# Patient Record
Sex: Female | Born: 1970 | Race: White | Hispanic: Yes | Marital: Married | Smoking: Never smoker
Health system: Southern US, Community
[De-identification: ages and names within clinical notes are randomized; demographics above are authoritative.]

## PROBLEM LIST (undated history)

## (undated) DIAGNOSIS — R42 Dizziness and giddiness: Secondary | ICD-10-CM

## (undated) DIAGNOSIS — R519 Headache, unspecified: Secondary | ICD-10-CM

## (undated) DIAGNOSIS — I10 Essential (primary) hypertension: Secondary | ICD-10-CM

## (undated) DIAGNOSIS — I639 Cerebral infarction, unspecified: Secondary | ICD-10-CM

## (undated) DIAGNOSIS — R51 Headache: Secondary | ICD-10-CM

## (undated) DIAGNOSIS — F32A Depression, unspecified: Secondary | ICD-10-CM

## (undated) DIAGNOSIS — F329 Major depressive disorder, single episode, unspecified: Secondary | ICD-10-CM

## (undated) DIAGNOSIS — E785 Hyperlipidemia, unspecified: Secondary | ICD-10-CM

## (undated) DIAGNOSIS — U071 COVID-19: Secondary | ICD-10-CM

## (undated) DIAGNOSIS — I493 Ventricular premature depolarization: Secondary | ICD-10-CM

## (undated) HISTORY — DX: Ventricular premature depolarization: I49.3

## (undated) HISTORY — DX: Essential (primary) hypertension: I10

## (undated) HISTORY — DX: Headache, unspecified: R51.9

## (undated) HISTORY — DX: Major depressive disorder, single episode, unspecified: F32.9

## (undated) HISTORY — DX: Depression, unspecified: F32.A

## (undated) HISTORY — DX: Headache: R51

## (undated) HISTORY — PX: OTHER SURGICAL HISTORY: SHX169

---

## 2015-01-01 ENCOUNTER — Encounter (HOSPITAL_COMMUNITY): Payer: Self-pay | Admitting: *Deleted

## 2015-01-01 ENCOUNTER — Emergency Department (HOSPITAL_COMMUNITY): Payer: Self-pay

## 2015-01-01 ENCOUNTER — Emergency Department (HOSPITAL_COMMUNITY)
Admission: EM | Admit: 2015-01-01 | Discharge: 2015-01-01 | Disposition: A | Discharge status: Home / Self Care | Payer: Self-pay | Attending: Emergency Medicine | Admitting: Emergency Medicine

## 2015-01-01 DIAGNOSIS — R42 Dizziness and giddiness: Secondary | ICD-10-CM | POA: Insufficient documentation

## 2015-01-01 DIAGNOSIS — R51 Headache: Secondary | ICD-10-CM | POA: Insufficient documentation

## 2015-01-01 DIAGNOSIS — Z87440 Personal history of urinary (tract) infections: Secondary | ICD-10-CM | POA: Insufficient documentation

## 2015-01-01 DIAGNOSIS — R519 Headache, unspecified: Secondary | ICD-10-CM

## 2015-01-01 DIAGNOSIS — Z792 Long term (current) use of antibiotics: Secondary | ICD-10-CM | POA: Insufficient documentation

## 2015-01-01 DIAGNOSIS — R202 Paresthesia of skin: Secondary | ICD-10-CM | POA: Insufficient documentation

## 2015-01-01 LAB — URINE MICROSCOPIC-ADD ON

## 2015-01-01 LAB — URINALYSIS, ROUTINE W REFLEX MICROSCOPIC
Bilirubin Urine: NEGATIVE
Glucose, UA: NEGATIVE mg/dL
Ketones, ur: NEGATIVE mg/dL
Nitrite: NEGATIVE
Protein, ur: NEGATIVE mg/dL
Specific Gravity, Urine: 1.014 (ref 1.005–1.030)
Urobilinogen, UA: 0.2 mg/dL (ref 0.0–1.0)
pH: 7.5 (ref 5.0–8.0)

## 2015-01-01 MED ORDER — METOCLOPRAMIDE HCL 5 MG/ML IJ SOLN
10.0000 mg | Freq: Once | INTRAMUSCULAR | Status: AC
  Administered 2015-01-01: 10 mg via INTRAVENOUS
  Filled 2015-01-01: qty 2

## 2015-01-01 MED ORDER — KETOROLAC TROMETHAMINE 15 MG/ML IJ SOLN
15.0000 mg | Freq: Once | INTRAMUSCULAR | Status: AC
  Administered 2015-01-01: 15 mg via INTRAVENOUS
  Filled 2015-01-01: qty 1

## 2015-01-01 MED ORDER — DIPHENHYDRAMINE HCL 50 MG/ML IJ SOLN
25.0000 mg | Freq: Once | INTRAMUSCULAR | Status: AC
  Administered 2015-01-01: 25 mg via INTRAVENOUS
  Filled 2015-01-01: qty 1

## 2015-01-01 NOTE — ED Notes (Signed)
Rt headache with intermittent  Arm and leg numbness with dizziness since Tuesday.  She has seen a doctor since then  And diagnosed with vertigo.  lmpo jan 20th

## 2015-01-01 NOTE — ED Provider Notes (Signed)
CSN: [id number]     Arrival date & time 01/01/15  1851 History   First MD Initiated Contact with Patient 01/01/15 1909     Chief Complaint  Patient presents with  . Headache     (Consider location/radiation/quality/duration/timing/severity/associated sxs/prior Treatment) Patient is a 44 y.o. female presenting with headaches. The history is provided by the patient. No language interpreter was used.  Headache Pain location:  R temporal Quality:  Dull Radiates to:  Does not radiate Severity currently:  10/10 Severity at highest:  10/10 Onset quality:  Gradual Duration:  5 days Timing:  Intermittent Progression:  Waxing and waning Chronicity:  New Similar to prior headaches: no   Context: not activity, not defecating and not stress   Relieved by:  Nothing Worsened by:  Nothing tried Ineffective treatments:  Aspirin and NSAIDs Associated symptoms: paresthesias (R hand and foot)   Associated symptoms: no abdominal pain, no back pain, no cough, no ear pain, no pain, no fever, no nausea, no photophobia, no vomiting and no weakness     History reviewed. No pertinent past medical history. History reviewed. No pertinent past surgical history. No family history on file. History  Substance Use Topics  . Smoking status: Never Smoker   . Smokeless tobacco: Not on file  . Alcohol Use: No   OB History    No data available     Review of Systems  Constitutional: Negative for fever.  HENT: Negative for ear pain.   Eyes: Negative for photophobia and pain.  Respiratory: Negative for cough.   Gastrointestinal: Negative for nausea, vomiting and abdominal pain.  Musculoskeletal: Negative for back pain.  Neurological: Positive for headaches and paresthesias (R hand and foot).  All other systems reviewed and are negative.     Allergies  Review of patient's allergies indicates no known allergies.  Home Medications   Prior to Admission medications   Medication Sig Start Date End Date  Taking? Authorizing Provider  sulfamethoxazole-trimethoprim (BACTRIM DS,SEPTRA DS) 800-160 MG per tablet Take 1 tablet by mouth 2 (two) times daily. Started 12/28/14, for 7 days ending 01/03/15   Yes Historical Provider, MD   BP 115/70 mmHg  Pulse 96  Temp(Src) 98.2 F (36.8 C) (Oral)  Resp 22  SpO2 98%  LMP 12/18/2014 Physical Exam  Constitutional: She is oriented to person, place, and time. She appears well-developed and well-nourished. No distress.  HENT:  Head: Normocephalic and atraumatic.  Eyes: Pupils are equal, round, and reactive to light.  Neck: Normal range of motion.  Cardiovascular: Normal rate, regular rhythm, normal heart sounds and intact distal pulses.   Pulmonary/Chest: Effort normal. No respiratory distress. She has no wheezes. She exhibits no tenderness.  Abdominal: Soft. Bowel sounds are normal. She exhibits no distension. There is no tenderness. There is no rebound and no guarding.  Neurological: She is alert and oriented to person, place, and time. She has normal strength. No cranial nerve deficit or sensory deficit. She exhibits normal muscle tone. Coordination and gait normal.  Strength 5/5 bilateral upper and lower extremities.  Sensation intact x4 extremities.  CN II-XII intact.  Normal gait with no ataxia.    Skin: Skin is warm and dry.  Nursing note and vitals reviewed.   ED Course  Procedures (including critical care time) Labs Review Labs Reviewed  URINALYSIS, ROUTINE W REFLEX MICROSCOPIC - Abnormal; Notable for the following:    Hgb urine dipstick TRACE (*)    Leukocytes, UA MODERATE (*)    All other components  within normal limits  URINE MICROSCOPIC-ADD ON    Imaging Review Ct Head Wo Contrast  01/01/2015   CLINICAL DATA:  Right-sided headache with intermittent arm and leg numbness. Dizziness. Since Tuesday. Patient has seen a Dr. Since then and was diagnosed with vertigo.  EXAM: CT HEAD WITHOUT CONTRAST  TECHNIQUE: Contiguous axial images were  obtained from the base of the skull through the vertex without intravenous contrast.  COMPARISON:  None.  FINDINGS: Ventricles and sulci appear symmetrical. No mass effect or midline shift. No abnormal extra-axial fluid collections. Gray-white matter junctions are distinct. Basal cisterns are not effaced. No evidence of acute intracranial hemorrhage. No depressed skull fractures. Visualized paranasal sinuses and mastoid air cells are not opacified.  IMPRESSION: No acute intracranial abnormalities.  Normal examination.   Electronically Signed   By: Lucienne Capers M.D.   On: 01/01/2015 21:43     EKG Interpretation None      MDM   Final diagnoses:  Headache   Patient is a 44 year old Hispanic female with no pertinent past medical history who comes to the emergency department today with right-sided headache intermittently since Tuesday. Patient has also had some dizziness associated with her headache.  She states intermittently she has also had some tingling in her hand and leg. Patient denies any symptoms now with the exception of the headache. Patient initially went to urgent care a couple of days ago and was diagnosed with a urinary tract infection and provided with Bactrim. She states that she was having nausea, vomiting, and dysuria prior to this treatment which has since resolved. UA was obtained to further evaluate for resolution of the UTI. UA demonstrated moderate leukocytes and rare bacteria, however microscopic only 0-2 WBCs as result I feel that the UTI is likely resolving at this time particularly with resolution of patient's symptoms. Patient is very concerned that she may have a tumor or stroke as a result a CT of her head was obtained to evaluate this further. CT head demonstrated no acute findings.  I doubt a CVA, hemorrhage or mass. Patient has no focal weakness.  I doubt a CVA. Patient was able to ambulate without any difficulties doubt a posterior CVA.  With slow intermittent headache  doubt SAH.  With no fevers doubt meningitis.  With young age doubt temporal arteritis.  Patient was treated with Reglan and Benadryl with moderate improvement in her symptoms. As a result I feel she is stable for discharge. Patient was instructed to return to the emergency department with weakness, numbness, or any other concerns. The patient expressed understanding. Labs and imaging reviewed by myself and considered in medical decision making. Imaging interpreted by radiology. Care was discussed with my attending Dr. Reather Converse.       Katheren Shams, MD 01/02/15 [id number]  Mariea Clonts, MD 01/03/15 [phone]

## 2015-01-01 NOTE — ED Notes (Signed)
Pt a/o x 4 on d/c with steady gait, with family.

## 2015-01-01 NOTE — Discharge Instructions (Signed)
If you were given medicines take as directed.  If you are on coumadin or contraceptives realize their levels and effectiveness is altered by many different medicines.  If you have any reaction (rash, tongues swelling, other) to the medicines stop taking and see a physician.   Please follow up as directed and return to the ER or see a physician for new or worsening symptoms.  Thank you. Filed Vitals:   01/01/15 1945 01/01/15 2015 01/01/15 2030 01/01/15 2045  BP: 140/75 127/85 132/87 147/91  Pulse: 94 93 91 101  Temp:      TempSrc:      Resp: 21 16 24 21   SpO2: 100% 100% 100% 100%

## 2015-01-01 NOTE — ED Notes (Signed)
Dr. Mariam Dollar at the bedside.

## 2015-01-26 ENCOUNTER — Ambulatory Visit (INDEPENDENT_AMBULATORY_CARE_PROVIDER_SITE_OTHER): Payer: Medicaid Other | Admitting: Neurology

## 2015-01-26 ENCOUNTER — Encounter: Payer: Self-pay | Admitting: Neurology

## 2015-01-26 VITALS — BP 154/86 | HR 106 | Temp 98.1°F | Resp 20 | Ht 64.0 in | Wt 146.2 lb

## 2015-01-26 DIAGNOSIS — H8110 Benign paroxysmal vertigo, unspecified ear: Secondary | ICD-10-CM

## 2015-01-26 DIAGNOSIS — G43109 Migraine with aura, not intractable, without status migrainosus: Secondary | ICD-10-CM

## 2015-01-26 NOTE — Patient Instructions (Addendum)
I think the headache and the numbness are all related to migraine. 1.  When you get a headache, take the Aleve at the earliest onset of the headache 2.  To help you sleep, try practicing the exercises we talked about 3.  Get your eyes checked.  You can see an optometrist at places like [facility] 4.  Follow up in 3 months but call with questions or concerns.  Creo que el dolor de cabeza y la sensacin de adormecimiento estn todos relacionados con la migraa. 1. Cuando usted consigue un dolor de cabeza, toma la Aleve en la primera aparicin del dolor de cabeza 2. Para ayudar a dormir, trate de Psychologist, prison and probation services los ejercicios de las que hablamos 3. Obtener chequeos de los ojos. Se puede ver un optometrista en lugares como [facility] 4. Seguimiento de 3 meses, pero llamar con preguntas o preocupaciones.

## 2015-01-26 NOTE — Progress Notes (Signed)
NEUROLOGY CONSULTATION NOTE  Carla [name] MRN: [id number] DOB: June 18, 1971  Referring provider: Dr. Jodi Mourning (ED) Primary care provider: NA  Reason for consult:  headache  HISTORY OF PRESENT ILLNESS: Carla [name] is a 44 year old left-handed woman who presents for headache.  Records, labs and CT of head reviewed.  Onset:  She has had headaches since around age 77. Location:  In the temples or eyes, either side Quality:  Sharp throbbing Intensity:  8/10 Aura:  no Prodrome:  no Associated symptoms:  Photophobia and phonophobia.  Sometimes nausea Duration:  2 hours (sometimes less with Aleve) Frequency:  Once or twice a month Triggers/exacerbating factors:  none Relieving factors:  Aleve, sliced potatoes on her temples while laying down Activity:  Able to function if needed.  In late January, she had an episode where she passed out.  She complained of not feeling well, including dysuria.  She went to urgent care and was diagnosed with a UTI.  She was started on antibiotics.  She also was experiencing intermittent episodes of spinning and lightheadedness that would occur with movement or closing her eyes.  It would last a few minutes.  She then developed her typical headache.  About three days later, on 01/01/15, she developed right facial numbness.  CT of the head was unremarkable.  UA still showed moderate leukocytes.  Headache was treated.  Headache and symptoms lasted all day and resolved.  She has not had another headache.  Past abortive therapy:  none Past preventative therapy:  none  Current abortive therapy:  Aleve Current preventative therapy:  none  Sleep hygiene:  Sleep is sometimes poor.   PAST MEDICAL HISTORY: Past Medical History  Diagnosis Date  . Headache     PAST SURGICAL HISTORY: No past surgical history on file.  MEDICATIONS: Current Outpatient Prescriptions on File Prior to Visit  Medication Sig Dispense Refill  . sulfamethoxazole-trimethoprim (BACTRIM  DS,SEPTRA DS) 800-160 MG per tablet Take 1 tablet by mouth 2 (two) times daily. Started 12/28/14, for 7 days ending 01/03/15     No current facility-administered medications on file prior to visit.    ALLERGIES: No Known Allergies  FAMILY HISTORY: Family History  Problem Relation Age of Onset  . Asthma Neg Hx   . Cancer Neg Hx   . Diabetes Neg Hx   . Hyperlipidemia Neg Hx   . Heart failure Neg Hx   . Hypertension Neg Hx   . Migraines Neg Hx   . Migraines Neg Hx   . Rashes / Skin problems Neg Hx   . Seizures Neg Hx   . Stroke Neg Hx   . Thyroid disease Neg Hx     SOCIAL HISTORY: History   Social History  . Marital Status: Single    Spouse Name: N/A  . Number of Children: N/A  . Years of Education: N/A   Occupational History  . Not on file.   Social History Main Topics  . Smoking status: Never Smoker   . Smokeless tobacco: Not on file  . Alcohol Use: No  . Drug Use: Not on file  . Sexual Activity: Not on file   Other Topics Concern  . Not on file   Social History Narrative    REVIEW OF SYSTEMS: Constitutional: No fevers, chills, or sweats, no generalized fatigue, change in appetite Eyes: No visual changes, double vision, eye pain Ear, nose and throat: No hearing loss, ear pain, nasal congestion, sore throat Cardiovascular: No chest pain, palpitations Respiratory:  No shortness of breath at rest or with exertion, wheezes GastrointestinaI: No nausea, vomiting, diarrhea, abdominal pain, fecal incontinence Genitourinary:  No dysuria, urinary retention or frequency Musculoskeletal:  No neck pain, back pain Integumentary: No rash, pruritus, skin lesions Neurological: as above Psychiatric: No depression, insomnia, anxiety Endocrine: No palpitations, fatigue, diaphoresis, mood swings, change in appetite, change in weight, increased thirst Hematologic/Lymphatic:  No anemia, purpura, petechiae. Allergic/Immunologic: no itchy/runny eyes, nasal congestion, recent  allergic reactions, rashes  PHYSICAL EXAM: Filed Vitals:   01/26/15 1428  BP: 154/86  Pulse: 106  Temp: 98.1 F (36.7 C)  Resp: 20   General: No acute distress Head:  Normocephalic/atraumatic Eyes:  fundi unremarkable, without vessel changes, exudates, hemorrhages or papilledema. Neck: supple, no paraspinal tenderness, full range of motion Back: No paraspinal tenderness Heart: regular rate and rhythm Lungs: Clear to auscultation bilaterally. Vascular: No carotid bruits. Neurological Exam: Mental status: alert and oriented to person, place, and time, recent and remote memory intact, fund of knowledge intact, attention and concentration intact, speech fluent and not dysarthric, language intact. Cranial nerves: CN I: not tested CN II: pupils equal, round and reactive to light, visual fields intact, fundi unremarkable, without vessel changes, exudates, hemorrhages or papilledema. CN III, IV, VI:  full range of motion, no nystagmus, no ptosis CN V: facial sensation intact CN VII: upper and lower face symmetric CN VIII: hearing intact CN IX, X: gag intact, uvula midline CN XI: sternocleidomastoid and trapezius muscles intact CN XII: tongue midline Bulk & Tone: normal, no fasciculations. Motor:  5/5 throughout Sensation:  Temperature and vibration intact Deep Tendon Reflexes:  2+ throughout, toes downgoing Finger to nose testing:  No dysmetria Heel to shin:  No dysmetria Gait:  Normal station and stride. Able to turn and walk in tandem. Romberg negative.  IMPRESSION: Migraine with and without aura Vertigo, likely inner ear pathology  PLAN: Since it is well-controlled (occuring once or twice a month and lasting for no more than 2 hours), I wouldn't start a preventative medication.  She may continue Aleve as needed for now.  She should work on the sleep hygiene tips.  Follow up in 3 months.  Thank you for allowing me to take part in the care of this patient.  Shon Millet,  DO

## 2015-05-04 ENCOUNTER — Ambulatory Visit: Payer: Medicaid Other | Admitting: Neurology

## 2015-05-05 ENCOUNTER — Encounter: Payer: Self-pay | Admitting: Neurology

## 2015-08-04 ENCOUNTER — Emergency Department (INDEPENDENT_AMBULATORY_CARE_PROVIDER_SITE_OTHER)
Admission: EM | Admit: 2015-08-04 | Discharge: 2015-08-04 | Disposition: A | Discharge status: Home / Self Care | Payer: Self-pay | Source: Home / Self Care | Attending: Emergency Medicine | Admitting: Emergency Medicine

## 2015-08-04 ENCOUNTER — Encounter (HOSPITAL_COMMUNITY): Payer: Self-pay | Admitting: Emergency Medicine

## 2015-08-04 DIAGNOSIS — H8302 Labyrinthitis, left ear: Secondary | ICD-10-CM

## 2015-08-04 MED ORDER — PREDNISONE 20 MG PO TABS: ORAL_TABLET | ORAL | Status: DC

## 2015-08-04 NOTE — ED Provider Notes (Signed)
CSN: [id number]     Arrival date & time 08/04/15  1553 History   First MD Initiated Contact with Patient 08/04/15 1627     Chief Complaint  Patient presents with  . Dizziness   (Consider location/radiation/quality/duration/timing/severity/associated sxs/prior Treatment) HPI  She is a 44 year old woman here for follow-up of vertigo. She states that 6 months ago she was diagnosed with vertigo. She was prescribed meloxicam and meclizine at that time which she states does help some. In the last week, her symptoms have gotten worse. She reports feeling a crawly sensation in her left ear as well as tinnitus in the left ear. She denies any loss of hearing. She does report some stinging sensation that makes it hard for her to balance. She states some days are fine and others are worse. Denies any specific triggers. History is limited by language barrier. She does have a friend that acts as Astronomer.  Past Medical History  Diagnosis Date  . Headache    History reviewed. No pertinent past surgical history. Family History  Problem Relation Age of Onset  . Asthma Neg Hx   . Cancer Neg Hx   . Diabetes Neg Hx   . Hyperlipidemia Neg Hx   . Heart failure Neg Hx   . Hypertension Neg Hx   . Migraines Neg Hx   . Migraines Neg Hx   . Rashes / Skin problems Neg Hx   . Seizures Neg Hx   . Stroke Neg Hx   . Thyroid disease Neg Hx    Social History  Substance Use Topics  . Smoking status: Never Smoker   . Smokeless tobacco: None  . Alcohol Use: No   OB History    No data available     Review of Systems As in history of present illness Allergies  Review of patient's allergies indicates no known allergies.  Home Medications   Prior to Admission medications   Medication Sig Start Date End Date Taking? Authorizing Provider  meclizine (ANTIVERT) 25 MG tablet Take 25 mg by mouth 3 (three) times daily as needed for dizziness.   Yes Historical Provider, MD  meloxicam (MOBIC) 15 MG tablet Take 15  mg by mouth daily.   Yes Historical Provider, MD  predniSONE (DELTASONE) 20 MG tablet Take 3 tablets daily for 5 days, then 2 tablets for 3 days, then 1 tablet for 3 days, then 1/2 tablet for 3 days. 08/04/15   Melony Overly, MD   Meds Ordered and Administered this Visit  Medications - No data to display  BP 143/81 mmHg  Pulse 87  Temp(Src) 98.3 F (36.8 C) (Oral)  Resp 16  SpO2 100%  LMP 07/12/2015 No data found.   Physical Exam  Constitutional: She is oriented to person, place, and time. She appears well-developed and well-nourished. No distress.  HENT:  Mouth/Throat: Oropharynx is clear and moist. No oropharyngeal exudate.  TMs normal bilaterally.  Eyes: Conjunctivae are normal.  Neck: Neck supple.  Cardiovascular: Normal rate, regular rhythm and normal heart sounds.   No murmur heard. Pulmonary/Chest: Effort normal and breath sounds normal. No respiratory distress. She has no wheezes. She has no rales.  Lymphadenopathy:    She has no cervical adenopathy.  Neurological: She is alert and oriented to person, place, and time.  Negative Dix-Hallpike.    ED Course  Procedures (including critical care time)  Labs Review Labs Reviewed - No data to display  Imaging Review No results found.     MDM  1. Labyrinthitis of left ear    With the tinnitus and ear discomfort, suspect more of labyrinthitis. Recommended continuing meclizine and meloxicam as needed. We'll treat with a prednisone taper. If this is not improving or gets worse, she will go to the emergency room for additional evaluation.    Melony Overly, MD 08/04/15 1726

## 2015-08-04 NOTE — Discharge Instructions (Signed)

## 2015-08-04 NOTE — ED Notes (Signed)
Patient reports being seen on [address] -urgent care and prescribed meloxicam and meclizine.  Patient reports has had symptoms for intermittently for 6 months.  Patient reports pain to left side of face. No noticeable weakness in face

## 2015-09-27 ENCOUNTER — Emergency Department (HOSPITAL_COMMUNITY)
Admission: EM | Admit: 2015-09-27 | Discharge: 2015-09-27 | Disposition: A | Discharge status: Home / Self Care | Payer: Medicaid Other | Attending: Emergency Medicine | Admitting: Emergency Medicine

## 2015-09-27 ENCOUNTER — Emergency Department (HOSPITAL_COMMUNITY): Payer: Medicaid Other

## 2015-09-27 ENCOUNTER — Encounter (HOSPITAL_COMMUNITY): Payer: Self-pay

## 2015-09-27 DIAGNOSIS — R160 Hepatomegaly, not elsewhere classified: Secondary | ICD-10-CM | POA: Diagnosis not present

## 2015-09-27 DIAGNOSIS — R079 Chest pain, unspecified: Secondary | ICD-10-CM

## 2015-09-27 DIAGNOSIS — R202 Paresthesia of skin: Secondary | ICD-10-CM | POA: Insufficient documentation

## 2015-09-27 DIAGNOSIS — Z7982 Long term (current) use of aspirin: Secondary | ICD-10-CM | POA: Diagnosis not present

## 2015-09-27 DIAGNOSIS — Z8781 Personal history of (healed) traumatic fracture: Secondary | ICD-10-CM | POA: Diagnosis not present

## 2015-09-27 LAB — COMPREHENSIVE METABOLIC PANEL
ALBUMIN: 3.9 g/dL (ref 3.5–5.0)
ALT: 19 U/L (ref 14–54)
ANION GAP: 7 (ref 5–15)
AST: 21 U/L (ref 15–41)
Alkaline Phosphatase: 70 U/L (ref 38–126)
BILIRUBIN TOTAL: 1 mg/dL (ref 0.3–1.2)
BUN: 7 mg/dL (ref 6–20)
CALCIUM: 9.3 mg/dL (ref 8.9–10.3)
CO2: 25 mmol/L (ref 22–32)
Chloride: 107 mmol/L (ref 101–111)
Creatinine, Ser: 0.77 mg/dL (ref 0.44–1.00)
Glucose, Bld: 80 mg/dL (ref 65–99)
POTASSIUM: 3.6 mmol/L (ref 3.5–5.1)
Sodium: 139 mmol/L (ref 135–145)
TOTAL PROTEIN: 7 g/dL (ref 6.5–8.1)

## 2015-09-27 LAB — CBC
HCT: 42.5 % (ref 36.0–46.0)
Hemoglobin: 13.7 g/dL (ref 12.0–15.0)
MCH: 28.7 pg (ref 26.0–34.0)
MCHC: 32.2 g/dL (ref 30.0–36.0)
MCV: 89.1 fL (ref 78.0–100.0)
Platelets: 326 10*3/uL (ref 150–400)
RBC: 4.77 MIL/uL (ref 3.87–5.11)
RDW: 12.8 % (ref 11.5–15.5)
WBC: 8.5 10*3/uL (ref 4.0–10.5)

## 2015-09-27 LAB — I-STAT TROPONIN, ED: TROPONIN I, POC: 0.01 ng/mL (ref 0.00–0.08)

## 2015-09-27 LAB — I-STAT BETA HCG BLOOD, ED (MC, WL, AP ONLY): I-stat hCG, quantitative: <5 m[IU]/mL (ref <5)

## 2015-09-27 LAB — APTT: aPTT: 30 seconds (ref 24–37)

## 2015-09-27 LAB — PROTIME-INR
INR: 1.08 (ref 0.00–1.49)
PROTHROMBIN TIME: 14.2 s (ref 11.6–15.2)

## 2015-09-27 LAB — D-DIMER, QUANTITATIVE: D-Dimer, Quant: 0.69 ug/mL-FEU — ABNORMAL HIGH (ref 0.00–0.48)

## 2015-09-27 MED ORDER — IOHEXOL 350 MG/ML SOLN
80.0000 mL | Freq: Once | INTRAVENOUS | Status: AC | PRN
  Administered 2015-09-27: 70 mL via INTRAVENOUS

## 2015-09-27 MED ORDER — TRAMADOL HCL 50 MG PO TABS: 50.0000 mg | ORAL_TABLET | Freq: Four times a day (QID) | ORAL | Status: DC | PRN

## 2015-09-27 MED ORDER — ASPIRIN 81 MG PO CHEW
324.0000 mg | CHEWABLE_TABLET | Freq: Once | ORAL | Status: AC
  Administered 2015-09-27: 324 mg via ORAL
  Filled 2015-09-27: qty 4
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
## 2015-09-27 NOTE — Discharge Instructions (Signed)
La tomografa computarizada mostr lesiones en el hgado que son hemangiomas ms probables. Seguimiento con un mdico de cabecera como hemos discutido para Copy.    Dolor de pecho inespecfico  (Nonspecific Chest Pain) El dolor de pecho puede deberse a muchas enfermedades diferentes. Siempre existe una posibilidad de que el dolor est relacionado con algo grave, como un infarto de miocardio o un cogulo sanguneo en los pulmones. Hay muchas enfermedades que no son potencialmente mortales que pueden causar dolor de [place]. Si tiene Social research officer, government de Engineer, building services, es muy importante que se controle con el mdico. CAUSAS  Las causas del dolor de pecho pueden ser las siguientes:  Acidez estomacal.  Neumona o bronquitis.  Ansiedad o estrs.  Inflamacin de la zona que rodea al corazn (pericarditis) o a los pulmones (pleuritis o pleuresa).  Un cogulo sanguneo en el pulmn.  Colapso de un pulmn (neumotrax), que puede aparecer de [employer] repentina por s solo (neumotrax espontneo) o debido a un traumatismo en el trax.  Culebrilla (virus de la varicela zster).  Infarto de miocardio.  Dao de los [place], los msculos y los cartlagos que conforman la pared torcica. Esto puede incluir lo siguiente:  Hematomas seos debido a lesiones.  Distensiones musculares o de los cartlagos por tos frecuente o repetida, o por exceso de trabajo.  Fractura de una o ms costillas.  Dolor de Database administrator debido a inflamacin (costocondritis). FACTORES DE RIESGO  Los factores de riesgo de tener dolor de pecho pueden incluir lo siguiente:  Actividades que incrementan el riesgo de sufrir traumatismos o lesiones en el trax.  Infecciones o enfermedades respiratorias que causan tos frecuente.  Enfermedades o [employer] comidas que pueden causar Geographical information systems officer.  Enfermedades cardacas o antecedentes familiares de enfermedades cardacas.  Enfermedades o comportamientos de salud que  aumentan el riesgo de tener un cogulo sanguneo.  Haber tenido varicela (varicela zster). SIGNOS Y SNTOMAS El dolor de pecho puede provocar las siguientes sensaciones:  Ardor u hormigueo en la superficie o en lo profundo del pecho.  Dolor opresivo, continuo o constrictivo.  Dolor vago o intenso que empeora al [employer], toser o inhalar profundamente.  Dolor que tambin se siente en la espalda, el cuello, el hombro o el brazo, o dolor que se irradia a cualquiera de estas zonas. El dolor de pecho puede aparecer y Armed forces operational officer, o bien puede ser constante. DIAGNSTICO Ileene Hutchinson se necesiten anlisis de laboratorio u otros estudios para Animator causa del Social research officer, government. El mdico puede indicarle que se haga una prueba llamada EGC (electrocadiograma) ambulatorio. El Radio broadcast assistant los patrones de los latidos cardacos en el momento en que se realiza el [place]. Tambin pueden hacerle otros estudios, por ejemplo:  Ecocardiograma transtorcico (ETT). Durante el ecocardiograma, se usan ondas sonoras para crear una imagen de todas las estructuras cardacas y evaluar cmo circula la sangre por el corazn.  Ecocardiograma transesofgico (ETE).Este es un estudio de diagnstico por imgenes ms avanzado que el obtiene imgenes del interior del cuerpo. Le permite al mdico ver el corazn con mayor detalle.  Monitoreo cardaco. Permite que el mdico controle la frecuencia y el ritmo cardaco en tiempo real.  Monitor Holter. Es un dispositivo porttil que [employer] latidos del corazn y puede ayudar a Retail buyer las arritmias cardacas. Le permite al [employer] registrar la actividad [place], si es necesario.  Pruebas de esfuerzo. Estas pueden realizarse durante el ejercicio o mediante la administracin de un medicamento que acelera los latidos del corazn.  Anlisis de [address]  Diagnstico por imgenes. TRATAMIENTO  El tratamiento depende de la causa del dolor de [place]. El  tratamiento puede incluir lo siguiente:  Medicamentos. Estos pueden incluir lo siguiente:  Inhibidores de Psychologist, forensic.  Antiinflamatorios.  Analgsicos para las enfermedades inflamatorias.  Antibiticos, si hay una infeccin.  Medicamentos para [employer]  Medicamentos para tratar la enfermedad arterial coronaria.  Tratamiento complementario para las enfermedades que no requieren la toma de medicamentos. Esto puede incluir lo siguiente:  Descansar.  Aplicar compresas fras o calientes en las zonas lesionadas.  Limitar las actividades hasta que [employer]. INSTRUCCIONES PARA EL CUIDADO EN EL HOGAR  Si le recetaron antibiticos, asegrese de terminarlos, incluso si comienza a sentirse mejor.  Evite las [employer] causen dolor de [place].  No consuma ningn producto que contenga tabaco, lo que incluye cigarrillos, tabaco de Higher education careers adviser o Psychologist, sport and exercise. Si necesita ayuda para dejar de fumar, consulte al mdico.  No beba alcohol.  Tome los medicamentos solamente como se lo haya indicado el mdico.  Concurra a todas las visitas de control como se lo haya indicado el mdico. Esto es importante. Esto incluye otros estudios si el dolor de pecho no desaparece.  Si la acidez es la causa del dolor de [place], tal vez le aconsejen que mantenga la cabeza levantada (elevada) mientras duerme. Esto reduce la probabilidad de que el cido retroceda del estmago al esfago.  Haga cambios en su estilo de vida como se lo haya indicado el mdico. Estos pueden incluir lo siguiente:  Psychologist, prison and probation services actividad fsica con regularidad. Pida al mdico que le sugiera algunas actividades que sean seguras para usted.  Consumir una dieta cardiosaludable. Un nutricionista matriculado puede ayudarlo a Adult nurse saludables.  Mantener un peso saludable.  Controlar la diabetes, si es necesario.  Reducir las situaciones de estrs. SOLICITE ATENCIN MDICA  SI:  El dolor de pecho no desaparece despus del tratamiento.  Tiene una erupcin cutnea con ampollas en el pecho.  Tiene fiebre. SOLICITE ATENCIN MDICA DE INMEDIATO SI:   El dolor en el pecho es ms intenso.  La tos empeora, o expectora sangre.  Siente un dolor abdominal intenso.  Siente debilidad intensa.  Se desmaya.  Tiene escalofros.  Tiene una molestia repentina e inexplicable en el pecho.  Tiene molestias repentinas e [employer], la espalda, el cuello o la [place].  Le falta el aire en cualquier momento.  Comienza a sudar de [place] repentina o la piel se le humedece.  Siente nuseas o vomita.  Se siente repentinamente mareado o se desmaya.  Siente que el corazn comienza a latir rpidamente o que se saltea latidos. Estos sntomas pueden representar un problema grave que constituye Engineer, maintenance (IT). No espere hasta que los sntomas desaparezcan. Solicite atencin mdica de inmediato. Comunquese con el servicio de emergencias de su localidad (911 en los [place]). No conduzca por sus propios medios [employer].   Esta informacin no tiene Marine scientist el consejo del mdico. Asegrese de hacerle al mdico cualquier pregunta que tenga.   Document Released: 11/11/2005 Document Revised: 12/02/2014 Elsevier Interactive Patient Education [employer].

## 2015-09-27 NOTE — ED Notes (Signed)
Patient returned from CT

## 2015-09-27 NOTE — ED Notes (Signed)
Pt. Reports having generalized chest pain since Monday, bilateral rt. Arm  and into her rt. Back. Pt. Denies any sob, n/v.. She reports that the pain is every month. She denies any injuries.  She also had a headache last night.  Alert and oriented X4.  NSR on the monitor.

## 2015-10-10 ENCOUNTER — Encounter (HOSPITAL_COMMUNITY): Payer: Self-pay | Admitting: Emergency Medicine

## 2015-10-10 ENCOUNTER — Emergency Department (HOSPITAL_COMMUNITY)
Admission: EM | Admit: 2015-10-10 | Discharge: 2015-10-10 | Disposition: A | Discharge status: Home / Self Care | Payer: Medicaid Other | Source: Home / Self Care | Attending: Emergency Medicine | Admitting: Emergency Medicine

## 2015-10-10 DIAGNOSIS — R599 Enlarged lymph nodes, unspecified: Secondary | ICD-10-CM | POA: Diagnosis not present

## 2015-10-10 DIAGNOSIS — R200 Anesthesia of skin: Secondary | ICD-10-CM

## 2015-10-10 DIAGNOSIS — R590 Localized enlarged lymph nodes: Secondary | ICD-10-CM

## 2015-10-10 NOTE — Discharge Instructions (Signed)
With the numbness you are having and the headache you experienced last week, you need a CT scan of your head. Your symptoms may be a side effect of the tramadol, but we need to make sure there is nothing going on in your brain. If you are able to schedule an appointment with the office on your Medicaid card this week, they may be able to order an outpatient CT scan. If they are unable to see you this week, please go to the emergency room.  Please schedule an appointment with the office under Medicaid card so they can follow the lymph node above your right collarbone.  Con el entumecimiento que est teniendo y Conservation officer, historic buildings de cabeza que experiment la semana pasada, usted necesita una tomografa computarizada de su cabeza. Sus sntomas pueden ser un efecto secundario del tramadol, pero debemos asegurarnos de que no hay nada que ocurra en su cerebro. Si puede programar una cita con la oficina en su tarjeta de Medicaid esta semana, es posible que pueda ordenar una tomografa computarizada ambulatoria. Si no pueden verle esta semana, vaya a la sala de emergencias.  Por favor, programe una cita con la oficina bajo la tarjeta de Medicaid para que puedan seguir el ganglio linftico por encima de la clavcula derecha.

## 2015-10-10 NOTE — ED Notes (Signed)
Pt reports she can not go to the ED today; will go after she gets out of work.

## 2015-10-10 NOTE — ED Notes (Signed)
Reports she was seen at [facility] ED on 11/2; here for a f/u C/o persistent left arm pain and chest pain Also reports s/e to tramadol; emesis and facial numbness A&O x4... No acute distress.
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
## 2015-10-11 ENCOUNTER — Emergency Department (HOSPITAL_COMMUNITY)
Admission: EM | Admit: 2015-10-11 | Discharge: 2015-10-11 | Discharge status: Against Medical Advice | Payer: Medicaid Other | Source: Home / Self Care | Attending: Emergency Medicine | Admitting: Emergency Medicine

## 2015-10-11 ENCOUNTER — Encounter (HOSPITAL_COMMUNITY): Payer: Self-pay | Admitting: Cardiology

## 2015-10-11 ENCOUNTER — Emergency Department (HOSPITAL_COMMUNITY): Payer: Medicaid Other

## 2015-10-11 ENCOUNTER — Inpatient Hospital Stay (HOSPITAL_COMMUNITY)
Admission: EM | Admit: 2015-10-11 | Discharge: 2015-10-13 | DRG: 066 | Disposition: A | Discharge status: Home / Self Care | Payer: Medicaid Other | Attending: Student in an Organized Health Care Education/Training Program | Admitting: Student in an Organized Health Care Education/Training Program

## 2015-10-11 ENCOUNTER — Encounter (HOSPITAL_COMMUNITY): Payer: Self-pay | Admitting: *Deleted

## 2015-10-11 DIAGNOSIS — R160 Hepatomegaly, not elsewhere classified: Secondary | ICD-10-CM | POA: Diagnosis present

## 2015-10-11 DIAGNOSIS — R479 Unspecified speech disturbances: Secondary | ICD-10-CM | POA: Insufficient documentation

## 2015-10-11 DIAGNOSIS — E785 Hyperlipidemia, unspecified: Secondary | ICD-10-CM | POA: Diagnosis present

## 2015-10-11 DIAGNOSIS — G939 Disorder of brain, unspecified: Secondary | ICD-10-CM

## 2015-10-11 DIAGNOSIS — I288 Other diseases of pulmonary vessels: Secondary | ICD-10-CM | POA: Diagnosis not present

## 2015-10-11 DIAGNOSIS — I639 Cerebral infarction, unspecified: Secondary | ICD-10-CM | POA: Diagnosis present

## 2015-10-11 DIAGNOSIS — K769 Liver disease, unspecified: Secondary | ICD-10-CM

## 2015-10-11 DIAGNOSIS — R51 Headache: Secondary | ICD-10-CM

## 2015-10-11 DIAGNOSIS — R202 Paresthesia of skin: Secondary | ICD-10-CM

## 2015-10-11 DIAGNOSIS — D1803 Hemangioma of intra-abdominal structures: Secondary | ICD-10-CM | POA: Diagnosis present

## 2015-10-11 DIAGNOSIS — D1800 Hemangioma unspecified site: Secondary | ICD-10-CM | POA: Insufficient documentation

## 2015-10-11 DIAGNOSIS — R519 Headache, unspecified: Secondary | ICD-10-CM

## 2015-10-11 DIAGNOSIS — Z886 Allergy status to analgesic agent status: Secondary | ICD-10-CM | POA: Diagnosis not present

## 2015-10-11 DIAGNOSIS — I63412 Cerebral infarction due to embolism of left middle cerebral artery: Principal | ICD-10-CM | POA: Diagnosis present

## 2015-10-11 DIAGNOSIS — G43909 Migraine, unspecified, not intractable, without status migrainosus: Secondary | ICD-10-CM | POA: Diagnosis present

## 2015-10-11 DIAGNOSIS — R4701 Aphasia: Secondary | ICD-10-CM

## 2015-10-11 HISTORY — DX: Hemangioma of intra-abdominal structures: D18.03

## 2015-10-11 LAB — CBC WITH DIFFERENTIAL/PLATELET
Basophils Absolute: 0 10*3/uL (ref 0.0–0.1)
Basophils Relative: 0 %
EOS ABS: 0.1 10*3/uL (ref 0.0–0.7)
Eosinophils Relative: 1 %
HCT: 41.3 % (ref 36.0–46.0)
HEMOGLOBIN: 13.6 g/dL (ref 12.0–15.0)
LYMPHS ABS: 2.9 10*3/uL (ref 0.7–4.0)
Lymphocytes Relative: 25 %
MCH: 29.4 pg (ref 26.0–34.0)
MCHC: 32.9 g/dL (ref 30.0–36.0)
MCV: 89.4 fL (ref 78.0–100.0)
MONOS PCT: 7 %
Monocytes Absolute: 0.8 10*3/uL (ref 0.1–1.0)
Neutro Abs: 7.4 10*3/uL (ref 1.7–7.7)
Neutrophils Relative %: 67 %
Platelets: 381 10*3/uL (ref 150–400)
RBC: 4.62 MIL/uL (ref 3.87–5.11)
RDW: 12.8 % (ref 11.5–15.5)
WBC: 11.2 10*3/uL — ABNORMAL HIGH (ref 4.0–10.5)

## 2015-10-11 LAB — BASIC METABOLIC PANEL
Anion gap: 7 (ref 5–15)
BUN: 7 mg/dL (ref 6–20)
CHLORIDE: 105 mmol/L (ref 101–111)
CO2: 26 mmol/L (ref 22–32)
CREATININE: 0.75 mg/dL (ref 0.44–1.00)
Calcium: 9.4 mg/dL (ref 8.9–10.3)
GFR calc Af Amer: >60 mL/min (ref >60)
GFR calc non Af Amer: >60 mL/min (ref >60)
GLUCOSE: 102 mg/dL — AB (ref 65–99)
POTASSIUM: 3.7 mmol/L (ref 3.5–5.1)
SODIUM: 138 mmol/L (ref 135–145)

## 2015-10-11 MED ORDER — INSULIN ASPART 100 UNIT/ML ~~LOC~~ SOLN: 0.0000 [IU] | Freq: Three times a day (TID) | SUBCUTANEOUS | Status: DC

## 2015-10-11 MED ORDER — INFLUENZA VAC SPLIT QUAD 0.5 ML IM SUSY
0.5000 mL | PREFILLED_SYRINGE | INTRAMUSCULAR | Status: AC
  Administered 2015-10-13: 0.5 mL via INTRAMUSCULAR
  Filled 2015-10-11 (×2): qty 0.5

## 2015-10-11 MED ORDER — STROKE: EARLY STAGES OF RECOVERY BOOK
Freq: Once | Status: AC
  Administered 2015-10-12: 03:00:00
  Filled 2015-10-11: qty 1

## 2015-10-11 MED ORDER — SENNOSIDES-DOCUSATE SODIUM 8.6-50 MG PO TABS: 1.0000 | ORAL_TABLET | Freq: Every evening | ORAL | Status: DC | PRN

## 2015-10-11 MED ORDER — ASPIRIN 325 MG PO TABS
325.0000 mg | ORAL_TABLET | Freq: Every day | ORAL | Status: DC
  Administered 2015-10-13: 325 mg via ORAL
  Filled 2015-10-11 (×2): qty 1

## 2015-10-11 MED ORDER — ASPIRIN 300 MG RE SUPP
300.0000 mg | Freq: Every day | RECTAL | Status: DC
  Administered 2015-10-12: 300 mg via RECTAL
  Filled 2015-10-11: qty 1

## 2015-10-11 MED ORDER — ENOXAPARIN SODIUM 40 MG/0.4ML ~~LOC~~ SOLN
40.0000 mg | Freq: Every day | SUBCUTANEOUS | Status: DC
Start: 2015-10-11 — End: 2015-10-13
  Administered 2015-10-12 (×2): 40 mg via SUBCUTANEOUS
  Filled 2015-10-11 (×2): qty 0.4

## 2015-10-11 NOTE — ED Provider Notes (Signed)
CSN: [id number]     Arrival date & time 10/11/15  1934 History   First MD Initiated Contact with Patient 10/11/15 2042     Chief Complaint  Patient presents with  . Return for MRI results      (Consider location/radiation/quality/duration/timing/severity/associated sxs/prior Treatment) HPI Comments: Patient here after being called back due to her MRI showing a CVA. Seen here earlier today due to having a headache as well as some right-sided weakness along with some dysarthria which is been over 24 hours in duration. Denies any vomiting. No altered mental status. Denies any trouble with her gait. Patient's MRI results were positive and patient was called back and she returns at this time.  The history is provided by the patient.    Past Medical History  Diagnosis Date  . Headache    History reviewed. No pertinent past surgical history. Family History  Problem Relation Age of Onset  . Asthma Neg Hx   . Cancer Neg Hx   . Diabetes Neg Hx   . Hyperlipidemia Neg Hx   . Heart failure Neg Hx   . Hypertension Neg Hx   . Migraines Neg Hx   . Migraines Neg Hx   . Rashes / Skin problems Neg Hx   . Seizures Neg Hx   . Stroke Neg Hx   . Thyroid disease Neg Hx    Social History  Substance Use Topics  . Smoking status: Never Smoker   . Smokeless tobacco: None  . Alcohol Use: No   OB History    No data available     Review of Systems  All other systems reviewed and are negative.     Allergies  Tramadol  Home Medications   Prior to Admission medications   Not on File   BP 155/86 mmHg  Pulse 102  Temp(Src) 98.2 F (36.8 C) (Oral)  Resp 20  Wt 140 lb (63.504 kg)  SpO2 100%  LMP 09/19/2015 Physical Exam  Constitutional: She is oriented to person, place, and time. She appears well-developed and well-nourished.  Non-toxic appearance. No distress.  HENT:  Head: Normocephalic and atraumatic.  Eyes: Conjunctivae, EOM and lids are normal. Pupils are equal, round, and  reactive to light.  Neck: Normal range of motion. Neck supple. No tracheal deviation present. No thyroid mass present.  Cardiovascular: Normal rate, regular rhythm and normal heart sounds.  Exam reveals no gallop.   No murmur heard. Pulmonary/Chest: Effort normal and breath sounds normal. No stridor. No respiratory distress. She has no decreased breath sounds. She has no wheezes. She has no rhonchi. She has no rales.  Abdominal: Soft. Normal appearance and bowel sounds are normal. She exhibits no distension. There is no tenderness. There is no rebound and no CVA tenderness.  Musculoskeletal: Normal range of motion. She exhibits no edema or tenderness.  Neurological: She is alert and oriented to person, place, and time. She has normal strength. No cranial nerve deficit or sensory deficit. GCS eye subscore is 4. GCS verbal subscore is 5. GCS motor subscore is 6.  Skin: Skin is warm and dry. No abrasion and no rash noted.  Psychiatric: She has a normal mood and affect. Her speech is normal and behavior is normal.  Nursing note and vitals reviewed.   ED Course  Procedures (including critical care time) Labs Review Labs Reviewed - No data to display  Imaging Review Mr Brain Wo Contrast  10/11/2015  CLINICAL DATA:  44 year old female with unexplained headache and right upper  extremity pain. Patient declined IV contrast administration. Initial encounter. EXAM: MRI HEAD WITHOUT CONTRAST TECHNIQUE: Multiplanar, multiecho pulse sequences of the brain and surrounding structures were obtained without intravenous contrast. COMPARISON:  Noncontrast head CT 01/01/2015. FINDINGS: Major intracranial vascular flow voids are within normal limits. Heterogeneous diffusion imaging in the left MCA territory superiorly and posteriorly. The largest lesion is 9 mm in the left corona radiata/ centrum semiovale (series 3, image 26. This does not appear restricted on ADC. Suggestion of other scattered surrounding cortical  and subcortical diffusion lesions, including 1 in the posterior left parietal lobe on series 3, image 22. None of these are definitely restricted on ADC. However, there is associated T2 and FLAIR hyperintensity in of the largest lesions, in including abnormal cortex at the left operculum as seen on series 6, image 14 and series 10, image 13. No associated hemorrhage or mass effect. No contralateral right hemisphere or posterior fossa diffusion lesions. Pearline Cables and white matter signal in the right hemisphere in posterior fossa remain normal. No midline shift, mass effect, evidence of mass lesion, ventriculomegaly, extra-axial collection or acute intracranial hemorrhage. Cervicomedullary junction and pituitary are within normal limits. Visible internal auditory structures appear normal. Mastoids are clear. Paranasal sinuses are clear. Negative orbit and scalp soft tissues. Visualized bone marrow signal is within normal limits. Negative visualized cervical spine. IMPRESSION: 1. 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 No associated hemorrhage or mass effect. The unilateral and cortical involvement argues against demyelinating disease which is felt to be the main differential consideration in this setting. 2. Negative noncontrast MRI appearance of the brain elsewhere. The patient declined IV contrast. Electronically Signed   By: Genevie Ann M.D.   On: 10/11/2015 16:32   I have personally reviewed and evaluated these images and lab results as part of my medical decision-making.   EKG Interpretation None      MDM   Final diagnoses:  None    Patient's MRI results reviewed with neurologist on call, Dr. Madalyn Rob, he will see the patient in consultation. Requests admission to the hospitalist service    Lacretia Leigh, MD 10/11/15 2105

## 2015-10-11 NOTE — Consult Note (Addendum)
Stroke Consult Consulting Physician: Dr Zenia Resides  Chief Complaint:  HPI: Carla [name] is an 44 y.o. female hx of headaches presenting to ED with headache, speech deficits and right sided weakness. Patient reports having severe headache 8 days ago associated with emesis. The headache resolved but she noted speech deficits and right sided weakness and paresthesias since then. Went to her PCP who sent her to the ED. Denies any prior CVA or TIAs. Does note history of blurred vision in the past. No history of MS. No history of heart palpitations. No family history of blood clotting.   MRI brain imaging reviewed, shows possible multiple subacute left MCA infarcts.   Date last known well: unclear Time last known well: unclear tPA Given: No: unclear LSW, symptoms resolved Modified Rankin: Rankin Score=0  Past Medical History  Diagnosis Date  . Headache     History reviewed. No pertinent past surgical history.  Family History  Problem Relation Age of Onset  . Asthma Neg Hx   . Cancer Neg Hx   . Diabetes Neg Hx   . Hyperlipidemia Neg Hx   . Heart failure Neg Hx   . Hypertension Neg Hx   . Migraines Neg Hx   . Migraines Neg Hx   . Rashes / Skin problems Neg Hx   . Seizures Neg Hx   . Stroke Neg Hx   . Thyroid disease Neg Hx    Social History:  reports that she has never smoked. She does not have any smokeless tobacco history on file. She reports that she does not drink alcohol. Her drug history is not on file.  Allergies:  Allergies  Allergen Reactions  . Tramadol Nausea Only     (Not in a hospital admission)  ROS: Out of a complete 14 system review, the patient complains of only the following symptoms, and all other reviewed systems are negative. + headache  Physical Examination: Filed Vitals:   10/11/15 1942  BP: 155/86  Pulse: 102  Temp: 98.2 F (36.8 C)  Resp: 20   Physical Exam  Constitutional: He appears well-developed and well-nourished.  Psych: Affect  appropriate to situation Eyes: No scleral injection HENT: No OP obstrucion Head: Normocephalic.  Cardiovascular: Normal rate and regular rhythm.  Respiratory: Effort normal and breath sounds normal.  GI: Soft. Bowel sounds are normal. No distension. There is no tenderness.  Skin: WDI   Neurologic Examination: Mental Status: Alert, oriented, thought content appropriate.  Speech fluent without evidence of aphasia.  Able to follow 3 step commands without difficulty. Cranial Nerves: II: funduscopic exam wnl bilaterally, visual fields grossly normal, pupils equal, round, reactive to light and accommodation III,IV, VI: ptosis not present, extra-ocular motions intact bilaterally V,VII: smile symmetric, facial light touch sensation normal bilaterally VIII: hearing normal bilaterally IX,X: gag reflex present XI: trapezius strength/neck flexion strength normal bilaterally XII: tongue strength normal  Motor: Right : Upper extremity    Left:     Upper extremity 5/5 deltoid       5/5 deltoid 5/5 biceps      5/5 biceps  5/5 triceps      5/5 triceps 5/5 hand grip      5/5 hand grip  Lower extremity     Lower extremity 5/5 hip flexor      5/5 hip flexor 5/5 quadricep      5/5 quadriceps  5/5 hamstrings     5/5 hamstrings 5/5 plantar flexion       5/5 plantar flexion 5/5 plantar extension  5/5 plantar extension Tone and bulk:normal tone throughout; no atrophy noted Sensory: Pinprick and light touch intact throughout, bilaterally Deep Tendon Reflexes: 2+ and symmetric throughout Plantars: Right: downgoing   Left: downgoing Cerebellar: normal finger-to-nose, and normal heel-to-shin test Gait: deferred  Laboratory Studies:   Basic Metabolic Panel:  Recent Labs Lab 10/11/15 1312  NA 138  K 3.7  CL 105  CO2 26  GLUCOSE 102*  BUN 7  CREATININE 0.75  CALCIUM 9.4    Liver Function Tests: No results for input(s): AST, ALT, ALKPHOS, BILITOT, PROT, ALBUMIN in the last 168  hours. No results for input(s): LIPASE, AMYLASE in the last 168 hours. No results for input(s): AMMONIA in the last 168 hours.  CBC:  Recent Labs Lab 10/11/15 1312  WBC 11.2*  NEUTROABS 7.4  HGB 13.6  HCT 41.3  MCV 89.4  PLT 381    Cardiac Enzymes: No results for input(s): CKTOTAL, CKMB, CKMBINDEX, TROPONINI in the last 168 hours.  BNP: Invalid input(s): POCBNP  CBG: No results for input(s): GLUCAP in the last 168 hours.  Microbiology: No results found for this or any previous visit.  Coagulation Studies: No results for input(s): LABPROT, INR in the last 72 hours.  Urinalysis: No results for input(s): COLORURINE, LABSPEC, PHURINE, GLUCOSEU, HGBUR, BILIRUBINUR, KETONESUR, PROTEINUR, UROBILINOGEN, NITRITE, LEUKOCYTESUR in the last 168 hours.  Invalid input(s): APPERANCEUR  Lipid Panel:  No results found for: CHOL, TRIG, HDL, CHOLHDL, VLDL, LDLCALC  HgbA1C: No results found for: HGBA1C  Urine Drug Screen:  No results found for: LABOPIA, COCAINSCRNUR, LABBENZ, AMPHETMU, THCU, LABBARB  Alcohol Level: No results for input(s): ETH in the last 168 hours.  Other results:  Imaging: Mr Brain Wo Contrast  10/11/2015  CLINICAL DATA:  44 year old female with unexplained headache and right upper extremity pain. Patient declined IV contrast administration. Initial encounter. EXAM: MRI HEAD WITHOUT CONTRAST TECHNIQUE: Multiplanar, multiecho pulse sequences of the brain and surrounding structures were obtained without intravenous contrast. COMPARISON:  Noncontrast head CT 01/01/2015. FINDINGS: Major intracranial vascular flow voids are within normal limits. Heterogeneous diffusion imaging in the left MCA territory superiorly and posteriorly. The largest lesion is 9 mm in the left corona radiata/ centrum semiovale (series 3, image 26. This does not appear restricted on ADC. Suggestion of other scattered surrounding cortical and subcortical diffusion lesions, including 1 in the posterior  left parietal lobe on series 3, image 22. None of these are definitely restricted on ADC. However, there is associated T2 and FLAIR hyperintensity in of the largest lesions, in including abnormal cortex at the left operculum as seen on series 6, image 14 and series 10, image 13. No associated hemorrhage or mass effect. No contralateral right hemisphere or posterior fossa diffusion lesions. Pearline Cables and white matter signal in the right hemisphere in posterior fossa remain normal. No midline shift, mass effect, evidence of mass lesion, ventriculomegaly, extra-axial collection or acute intracranial hemorrhage. Cervicomedullary junction and pituitary are within normal limits. Visible internal auditory structures appear normal. Mastoids are clear. Paranasal sinuses are clear. Negative orbit and scalp soft tissues. Visualized bone marrow signal is within normal limits. Negative visualized cervical spine. IMPRESSION: 1. 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 No associated hemorrhage or mass effect. The unilateral and cortical involvement argues against demyelinating disease which is felt to be the main differential consideration in this setting. 2. Negative noncontrast MRI appearance of the brain elsewhere. The patient declined IV  contrast. Electronically Signed   By: Genevie Ann M.D.   On: 10/11/2015 16:32    Assessment: 44 y.o. female hx of headaches presenting with history of severe headache, speech deficits and right sided sensory and motor deficits. Currently asymptomatic. MRI brain shows scattered abnormalities in the left MCA distribution concerning for subacute infarcts. Patient with no known stroke risk factors. Based on age and presentation would also consider demyelinating disease such as MS.  Plan: 1. HgbA1c, fasting lipid panel 2. MRA  of the brain without contrast 3. PT consult, OT consult, Speech  consult 4. Echocardiogram 5. Carotid dopplers 6. Prophylactic therapy-ASA 325mg  daily 7. Risk factor modification 8. Telemetry monitoring 9. Frequent neuro checks 10. NPO until RN stroke swallow screen 11. Suggest MRI C spine with and without to check for other potential MS lesions. Further workup for possible demyelinating disease pending above results   Jim Like, DO [facility] [phone]  If 7pm- 7am, please page neurology on call as listed in [place]. 10/11/2015, 9:22 PM
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
## 2015-10-11 NOTE — ED Provider Notes (Signed)
CSN: [id number]     Arrival date & time 10/11/15  1147 History   First MD Initiated Contact with Patient 10/11/15 1228     Chief Complaint  Patient presents with  . Headache     (Consider location/radiation/quality/duration/timing/severity/associated sxs/prior Treatment) Patient is a 44 y.o. female presenting with headaches. The history is provided by the patient and a friend. A language interpreter was used.  Headache Associated symptoms: no abdominal pain, no dizziness, no fever, no numbness and no weakness    Miss Gaschler is a 44 year old female who presents requesting a CT scan of the head after having a severe headache 8 days ago with 2 episodes of vomiting. She states that her headache and vomiting resolved soon after that that she was left with difficulty pronouncing words and intermittent numbness on the right side of her face and foot. Her friend states that her smile is different and that she noticed a droop on the left side. She was seen by her primary care physician yesterday for a follow-up after having chest pain 2 weeks ago. She described her symptoms to her physician at that time who recommended that she go immediately to the ED and had a CT scan done. She states she was unable to come yesterday because she was busy. She denies any history of hypertension or being on any anticoagulation medications. She denies any recent illness, fever, chills, chest pain, vision changes, headache, shortness of breath, abdominal pain, nausea, vomiting, diarrhea, constipation. Her last measure. Was 09/19/2015.   Past Medical History  Diagnosis Date  . Headache    History reviewed. No pertinent past surgical history. Family History  Problem Relation Age of Onset  . Asthma Neg Hx   . Cancer Neg Hx   . Diabetes Neg Hx   . Hyperlipidemia Neg Hx   . Heart failure Neg Hx   . Hypertension Neg Hx   . Migraines Neg Hx   . Migraines Neg Hx   . Rashes / Skin problems Neg Hx   . Seizures Neg Hx    . Stroke Neg Hx   . Thyroid disease Neg Hx    Social History  Substance Use Topics  . Smoking status: Never Smoker   . Smokeless tobacco: None  . Alcohol Use: No   OB History    No data available     Review of Systems  Constitutional: Negative for fever.  Eyes: Negative for visual disturbance.  Respiratory: Negative for shortness of breath.   Cardiovascular: Negative for chest pain.  Gastrointestinal: Negative for abdominal pain.  Neurological: Positive for facial asymmetry, speech difficulty and headaches. Negative for dizziness, syncope, weakness, light-headedness and numbness.  All other systems reviewed and are negative.     Allergies  Tramadol  Home Medications   Prior to Admission medications   Not on File   BP 124/92 mmHg  Pulse 85  Temp(Src) 98 F (36.7 C) (Oral)  Resp 18  Wt 140 lb (63.504 kg)  SpO2 100%  LMP 09/19/2015 Physical Exam  Constitutional: She is oriented to person, place, and time. She appears well-developed and well-nourished.  HENT:  Head: Normocephalic and atraumatic.  Eyes: Conjunctivae are normal.  Neck: Normal range of motion. Neck supple.  Cardiovascular: Normal rate, regular rhythm and normal heart sounds.   Pulmonary/Chest: Effort normal and breath sounds normal.  Abdominal: Soft. There is no tenderness.  Musculoskeletal: Normal range of motion.  Neurological: She is alert and oriented to person, place, and time.  Normal gait.  Normal finger to nose bilaterally. Normal strength in both upper and lower extremities. No sensory deficit. GCS of 15. Cranial nerves III through XII intact. Patient is Spanish-speaking but no difficulty communicating with interpreter. No obvious facial droop.  Skin: Skin is warm and dry.  Nursing note and vitals reviewed.   ED Course  Procedures (including critical care time) Labs Review Labs Reviewed  CBC WITH DIFFERENTIAL/PLATELET - Abnormal; Notable for the following:    WBC 11.2 (*)    All  other components within normal limits  BASIC METABOLIC PANEL - Abnormal; Notable for the following:    Glucose, Bld 102 (*)    All other components within normal limits    Imaging Review Mr Brain Wo Contrast  10/11/2015  CLINICAL DATA:  44 year old female with unexplained headache and right upper extremity pain. Patient declined IV contrast administration. Initial encounter. EXAM: MRI HEAD WITHOUT CONTRAST TECHNIQUE: Multiplanar, multiecho pulse sequences of the brain and surrounding structures were obtained without intravenous contrast. COMPARISON:  Noncontrast head CT 01/01/2015. FINDINGS: Major intracranial vascular flow voids are within normal limits. Heterogeneous diffusion imaging in the left MCA territory superiorly and posteriorly. The largest lesion is 9 mm in the left corona radiata/ centrum semiovale (series 3, image 26. This does not appear restricted on ADC. Suggestion of other scattered surrounding cortical and subcortical diffusion lesions, including 1 in the posterior left parietal lobe on series 3, image 22. None of these are definitely restricted on ADC. However, there is associated T2 and FLAIR hyperintensity in of the largest lesions, in including abnormal cortex at the left operculum as seen on series 6, image 14 and series 10, image 13. No associated hemorrhage or mass effect. No contralateral right hemisphere or posterior fossa diffusion lesions. Pearline Cables and white matter signal in the right hemisphere in posterior fossa remain normal. No midline shift, mass effect, evidence of mass lesion, ventriculomegaly, extra-axial collection or acute intracranial hemorrhage. Cervicomedullary junction and pituitary are within normal limits. Visible internal auditory structures appear normal. Mastoids are clear. Paranasal sinuses are clear. Negative orbit and scalp soft tissues. Visualized bone marrow signal is within normal limits. Negative visualized cervical spine. IMPRESSION: 1. Trace diffusion,  T2, and FLAIR signal abnormality scattered in the posterior left MCA territory, most compatible with multiple small subacute infarcts. No associated hemorrhage or mass effect. The unilateral and cortical involvement argues against demyelinating disease which is felt to be the main differential consideration in this setting. 2. Negative noncontrast MRI appearance of the brain elsewhere. The patient declined IV contrast. Electronically Signed   By: Genevie Ann M.D.   On: 10/11/2015 16:32   I have personally reviewed and evaluated these images and lab results as part of my medical decision-making.   EKG Interpretation None      MDM   Final diagnoses:  Aphasia  Patient presents requesting CT head after severe headache with 2 episodes of vomiting 8 days ago. Her symptoms resolved but she noticed that she could not pronounce certain words and had right sided numbness on her face and foot.  She was seen by her pcp yesterday who told her to come to the ED for a CT head.  She said she was busy yesterday so she came today.  She has no pain complaints.  Her vitals are stable and she is in no acute distress.  She has a normal neurological exam and her pronunciation difficulties are difficult to appreciate since she is spanish speaking and I had to use the interpreter.  Her friend who is at bedside agrees that she is having problems with pronunciation.   I discussed this patient with Dr. Tomi Bamberger. He agrees that the patient should have an MR brain instead of a CT.  The rational behind this is, if the CT head is normal then we would proceed with an MR.   After patient returned from MRI she told the nurse that she had to leave to pick up her son. She states her husband is at work and there is no one available to pick him up. I explained that she would be leaving [place]. I also discussed that she would need to follow up with her primary care physician regarding her MRI results because there is no follow up  in the ED. She verbalizes understanding of plan.   Patient left before MR brain resulted.  MR brain shows scattered posterior left MCA infarcts. The patient was discussed with Dr. Tomi Bamberger. He recommended explaining the situation to the charge nurse. The patient would need to return to the ED and be admitted.    Ottie Glazier, PA-C 10/11/15 [place], MD 10/13/15 [phone]

## 2015-10-11 NOTE — ED Notes (Signed)
Patient has returned for MRI results

## 2015-10-11 NOTE — ED Notes (Signed)
Pt reports she was seen here 2 weeks ago, and told to follow up with PCP office related to her headache and right arm pain. Pt went to PCP office yesterday and was told to go have a CT scan done at the ER but states she could not come yesterday.

## 2015-10-11 NOTE — ED Notes (Signed)
Report attempted 

## 2015-10-11 NOTE — ED Notes (Signed)
Called pt. Per Dr. Johnsie Kindred request to inform the pt. Carla [name] , that we would like her to return to our ED for further testing.  When calling her number, [phone], it will not except any voice mails or new phone calls.  Called pt.s Dacia, [place] [phone] and asked her if she could get in touch with her sister and have her call [phone].  Explained to the sister that we would like to speak with her

## 2015-10-11 NOTE — Discharge Instructions (Signed)
Afasia (Aphasia) La afasia es un dao en la regin del cerebro que interviene en la comunicacin. En la [employer], esa regin se encuentra en el lado izquierdo del cerebro. La afasia no afecta la inteligencia, pero tal vez le cause dificultades para hablar, comprender el lenguaje, leer o escribir. Este trastorno puede ocurrir en cualquier persona de Higher education careers adviser edad, pero es ms frecuente en aquellas de edad avanzada. CAUSAS  Una interrupcin en la irrigacin sangunea al cerebro (ictus) es la causa de afasia ms comn. Cualquier enfermedad o trastorno que dae las regiones del cerebro que intervienen en la comunicacin pueden causar afasia. Estos incluyen los siguientes:   Tumores cerebrales.  Lesiones cerebrales.  Infecciones cerebrales.  Enfermedades progresivas del sistema nervioso (trastornos neurolgicos). FACTORES DE RIESGO Puede correr riesgo de tener afasia si sufri un traumatismo, una enfermedad o un trastorno que daaron las regiones del cerebro que intervienen en la comunicacin. SIGNOS Y SNTOMAS  La afasia puede comenzar repentinamente si se debe a un ictus o a una lesin cerebral. La afasia causada por un tumor o un trastorno neurolgico progresivo puede comenzar de manera gradual. La alteracin afecta a las personas de [employer]. Los signos y los sntomas de afasia incluyen:  Dificultad para Herbalist.  [place].  Expresarse con oraciones que no tienen sentido.  Inventar palabras.  Incapacidad de comprender el lenguaje de [employer].  Problemas para escribir, Education officer, community.  Problemas con los nmeros.  Dificultad para tragar. DIAGNSTICO  El mdico puede sospechar la presencia de afasia si usted pierde la capacidad de hablar o entender el lenguaje. Tal vez deba consultar a Teaching laboratory technician (patlogo del habla y del lenguaje) que ayude a Teacher, adult education el diagnstico de [place]. Esta persona realiza una  serie de pruebas para revisar su capacidad de hacer lo siguiente:  Hablar.  Expresar ideas.  Entablar una conversacin.  Entender el lenguaje.  Leer y Office manager. TRATAMIENTO  En algunos casos, la afasia puede mejorar por s sola con el transcurso del [place]. Generalmente, el tratamiento incluye una terapia con un especialista. El tratamiento se disear para satisfacer sus necesidades y habilidades. Los tratamientos frecuentes son los siguientes:  Terapia del habla.  Aprender otras formas de comunicarse.  Trabajar con los familiares para Pension scheme manager las formas ms adecuadas para comunicarse.  Trabajar con un terapeuta ocupacional para encontrar formas para comunicarse en el trabajo. INSTRUCCIONES PARA EL CUIDADO EN EL HOGAR  Concurra a todas las visitas de control.  Asegrese de tener un buen sistema de apoyo en su hogar.  Las siguientes tcnicas pueden ser tiles al momento de comunicarse:  Use oraciones cortas y sencillas. Pdales a los miembros de su familia que hagan lo mismo. Las oraciones que requieren respuestas de una sola palabra son ms fciles.  Evite las distracciones, como el ruido ambiental, cuando intente Pharmacist, community o Electrical engineer.  Trate de [employer], seas o dibujos.  Hable lentamente. Pdales a sus familiares que le hablen lentamente.  Mantenga el contacto visual cuando se comunique. SOLICITE ATENCIN MDICA SI:  Los sntomas [place] o [place].  Necesita ms apoyo en su hogar.  Est lidiando con sntomas de ansiedad o depresin.  Tiene dificultad para tragar.   Esta informacin no tiene Marine scientist el consejo del mdico. Asegrese de hacerle al mdico cualquier pregunta que tenga.   Document Released: 03/08/2013 Document Revised: 12/02/2014 Elsevier Interactive Patient Education [employer].

## 2015-10-11 NOTE — ED Notes (Signed)
Admitting at bedside 

## 2015-10-12 ENCOUNTER — Inpatient Hospital Stay (HOSPITAL_COMMUNITY): Payer: Medicaid Other

## 2015-10-12 DIAGNOSIS — I639 Cerebral infarction, unspecified: Secondary | ICD-10-CM

## 2015-10-12 DIAGNOSIS — I63412 Cerebral infarction due to embolism of left middle cerebral artery: Principal | ICD-10-CM

## 2015-10-12 LAB — URINE MICROSCOPIC-ADD ON

## 2015-10-12 LAB — LIPID PANEL
Cholesterol: 207 mg/dL — ABNORMAL HIGH (ref 0–200)
HDL: 50 mg/dL (ref >40)
LDL Cholesterol: 139 mg/dL — ABNORMAL HIGH (ref 0–99)
Total CHOL/HDL Ratio: 4.1 RATIO
Triglycerides: 89 mg/dL (ref <150)
VLDL: 18 mg/dL (ref 0–40)

## 2015-10-12 LAB — URINALYSIS, ROUTINE W REFLEX MICROSCOPIC
Bilirubin Urine: NEGATIVE
Glucose, UA: NEGATIVE mg/dL
Ketones, ur: >80 mg/dL — AB
Leukocytes, UA: NEGATIVE
NITRITE: NEGATIVE
Protein, ur: NEGATIVE mg/dL
pH: 5.5 (ref 5.0–8.0)

## 2015-10-12 LAB — SEDIMENTATION RATE: SED RATE: 2 mm/h (ref 0–22)

## 2015-10-12 LAB — C-REACTIVE PROTEIN

## 2015-10-12 MED ORDER — GADOBENATE DIMEGLUMINE 529 MG/ML IV SOLN
13.0000 mL | Freq: Once | INTRAVENOUS | Status: AC | PRN
Start: 2015-10-12 — End: 2015-10-12
  Administered 2015-10-12: 13 mL via INTRAVENOUS

## 2015-10-12 MED ORDER — ACETAMINOPHEN 325 MG PO TABS
650.0000 mg | ORAL_TABLET | Freq: Four times a day (QID) | ORAL | Status: DC | PRN
  Administered 2015-10-12: 650 mg via ORAL
  Filled 2015-10-12 (×2): qty 2

## 2015-10-12 MED ORDER — ATORVASTATIN CALCIUM 40 MG PO TABS
40.0000 mg | ORAL_TABLET | Freq: Every day | ORAL | Status: DC
  Administered 2015-10-12 – 2015-10-13 (×2): 40 mg via ORAL
  Filled 2015-10-12 (×3): qty 1

## 2015-10-12 NOTE — Care Management Note (Addendum)
Case Management Note  Patient Details  Name: Carla [name] MRN: [id number] Date of Birth: 1971-01-13  Subjective/Objective:                    Action/Plan: Patient admitted with CVA. Patient lives at home with her husband. Awaiting further testing and PT/OT recommendations. No PCP listed for patient. CM met with the patient and she denies a PCP but pt is Medicaid and they assign patients a PCP. CM asked that card be brought to the hospital tomorrow so we can verify pts PCP. CM will continue to follow for discharge needs.  Expected Discharge Date:                  Expected Discharge Plan:     In-House Referral:     Discharge planning Services     Post Acute Care Choice:    Choice offered to:     DME Arranged:    DME Agency:     HH Arranged:    HH Agency:     Status of Service:  In process, will continue to follow  Medicare Important Message Given:    Date Medicare IM Given:    Medicare IM give by:    Date Additional Medicare IM Given:    Additional Medicare Important Message give by:     If discussed at [place] of Stay Meetings, dates discussed:    Additional Comments:  Pollie Friar, RN 10/12/2015, 1:42 PM

## 2015-10-12 NOTE — Progress Notes (Signed)
VASCULAR LAB PRELIMINARY  PRELIMINARY  PRELIMINARY  PRELIMINARY  Carotid duplex completed.    Preliminary report:  1-39% ICA stenosis.  Vertebral artery flow is antegrade.   Keneth Borg, RVT 10/12/2015, 4:36 PM

## 2015-10-12 NOTE — Evaluation (Signed)
SLP Cancellation Note  Patient Details Name: Carla [name] MRN: [id number] DOB: 09-10-1971   Cancelled treatment:       Reason Eval/Treat Not Completed: Patient at procedure or test/unavailable   Luanna Salk, [place] [facility] SLP [phone]

## 2015-10-12 NOTE — Progress Notes (Signed)
OT Cancellation Note  Patient Details Name: Carla [name] MRN: [id number] DOB: 18-Jan-1971   Cancelled Treatment:    Reason Eval/Treat Not Completed: OT screened, no needs identified, will sign off (Pt is at her baseline in mobility and ADL.)  Malka So 10/12/2015, 10:59 AM

## 2015-10-12 NOTE — Evaluation (Signed)
Physical Therapy Evaluation and Discharge Patient Details Name: Carla [name] MRN: [id number] DOB: Jul 15, 1971 Today's Date: 10/12/2015   History of Present Illness  Pt is a 44 y/o Spanish-speaking female who presents with R arm weakness and numbness. 8 days ago pt had symptoms whoch spontaneously resolved. Returned to the ED from urgent care and MRI was obtained. Results showed multiple subacute infarcts in the L MCA territory. Of note, 2 weeks ago pt presented to the ED with atypical CP and CTA of chest was negative for PE but did show multiple large liver lesions, thought to be hemangiomas.    Clinical Impression  Patient evaluated by Physical Therapy with no further acute PT needs identified. All education has been completed and the patient has no further questions. At the time of PT eval pt was able to perform transfers and ambulation with independence. Pt reports she is at baseline of function. Testing revealed no strength deficits, no balance impairments, no sensation changes. See below for any follow-up Physical Therapy or equipment needs. PT is signing off, but please reconsult if needs change. Thank you for this referral.    Follow Up Recommendations No PT follow up    Equipment Recommendations  None recommended by PT    Recommendations for Other Services       Precautions / Restrictions Precautions Precautions: Fall Restrictions Weight Bearing Restrictions: No      Mobility  Bed Mobility Overal bed mobility: Independent             General bed mobility comments: No assist required.   Transfers Overall transfer level: Independent Equipment used: None             General transfer comment: No unsteadiness noted, no assist required.   Ambulation/Gait Ambulation/Gait assistance: Modified independent (Device/Increase time) Ambulation Distance (Feet): 500 Feet Assistive device: None Gait Pattern/deviations: WFL(Within Functional Limits) Gait velocity: Initially  slow but was able to improve with cues to her normal pace Gait velocity interpretation: at or above normal speed for age/gender General Gait Details: Initially, pt guarded and slow. Was able to make corrective changes with cues and demonstrated a more natural gait pattern. No unsteadiness noted.   Stairs            Wheelchair Mobility    Modified Rankin (Stroke Patients Only) Modified Rankin (Stroke Patients Only) Pre-Morbid Rankin Score: No symptoms Modified Rankin: No significant disability     Balance Overall balance assessment: No apparent balance deficits (not formally assessed)                                           Pertinent Vitals/Pain Pain Assessment: No/denies pain    Home Living Family/patient expects to be discharged to:: Private residence Living Arrangements: Spouse/significant other;Children;Other relatives Available Help at Discharge: Family;Available 24 hours/day Type of Home: House Home Access: Stairs to enter Entrance Stairs-Rails: Right Entrance Stairs-Number of Steps: 1 Home Layout: One level Home Equipment: Shower seat - built in;Cane - single point      Prior Function Level of Independence: Independent               Hand Dominance        Extremity/Trunk Assessment   Upper Extremity Assessment: Overall WFL for tasks assessed           Lower Extremity Assessment: Overall WFL for tasks assessed      Cervical /  Trunk Assessment: Normal  Communication   Communication: Prefers language other than English (Spanish)  Cognition Arousal/Alertness: Awake/alert Behavior During Therapy: WFL for tasks assessed/performed Overall Cognitive Status: Within Functional Limits for tasks assessed                      General Comments      Exercises        Assessment/Plan    PT Assessment Patent does not need any further PT services  PT Diagnosis Difficulty walking   PT Problem List    PT Treatment  Interventions     PT Goals (Current goals can be found in the Care Plan section) Acute Rehab PT Goals PT Goal Formulation: All assessment and education complete, DC therapy    Frequency     Barriers to discharge        Co-evaluation               End of Session   Activity Tolerance: Patient tolerated treatment well Patient left: in bed;with call bell/phone within reach;with family/visitor present;Other (comment) (Staff present for Echo) Nurse Communication: Mobility status         Time: 5621-3086 PT Time Calculation (min) (ACUTE ONLY): 17 min   Charges:   PT Evaluation $Initial PT Evaluation Tier I: 1 Procedure     PT G Codes:        Conni Slipper 2015-11-05, 10:07 AM   Conni Slipper, PT, DPT Acute Rehabilitation Services Pager: [phone]

## 2015-10-12 NOTE — Progress Notes (Addendum)
STROKE TEAM PROGRESS NOTE   HISTORY Perel Balsley is an 44 y.o. female hx of headaches presenting to ED with headache, speech deficits and right sided weakness. Patient reports having severe headache 8 days ago associated with emesis. The headache resolved but she noted speech deficits and right sided weakness and paresthesias since then. Went to her PCP who sent her to the ED. Denies any prior CVA or TIAs. Does note history of blurred vision in the past. No history of MS. No history of heart palpitations. No family history of blood clotting.  MRI brain imaging reviewed, shows possible multiple subacute left MCA infarcts. Her last known well is unclear. Modified Rankin: Rankin Score=0. Patient was not administered TPA secondary to unclear LSW, symptoms resolved. She was admitted for further evaluation and treatment.   SUBJECTIVE (INTERVAL HISTORY) Her son, daughter-in-law and grandson are at the bedside. Pt reports she has a hx of migraines and this feels like a migraine to her. The biggest difference is the numbness and nausea that were also present. This happened last Tues and but resolved. Only mild HA remains with no trouble talking. No hx VTEs. No history miscarriages. No hx strokes in the family. She does not smoke. No hypertension. She does report occasions where her heart rate is fast, she has not passed out or experienced dizziness.   OBJECTIVE Temp:  [98 F (36.7 C)-98.6 F (37 C)] 98.6 F (37 C) (11/17 0850) Pulse Rate:  [85-106] 98 (11/17 0850) Cardiac Rhythm:  [-] Normal sinus rhythm (11/17 0700) Resp:  [15-20] 16 (11/17 0850) BP: (113-155)/(70-99) 114/74 mmHg (11/17 0850) SpO2:  [97 %-100 %] 100 % (11/17 0850) Weight:  [63.504 kg (140 lb)-63.957 kg (141 lb)] 63.957 kg (141 lb) (11/17 0000)  CBC:   Recent Labs Lab 10/11/15 1312  WBC 11.2*  NEUTROABS 7.4  HGB 13.6  HCT 41.3  MCV 89.4  PLT 387    Basic Metabolic Panel:   Recent Labs Lab 10/11/15 1312  NA 138  K 3.7   CL 105  CO2 26  GLUCOSE 102*  BUN 7  CREATININE 0.75  CALCIUM 9.4    Lipid Panel:     Component Value Date/Time   CHOL 207* 10/12/2015 0638   TRIG 89 10/12/2015 0638   HDL 50 10/12/2015 0638   CHOLHDL 4.1 10/12/2015 0638   VLDL 18 10/12/2015 0638   LDLCALC 139* 10/12/2015 0638   HgbA1c: No results found for: HGBA1C Urine Drug Screen: No results found for: LABOPIA, COCAINSCRNUR, LABBENZ, AMPHETMU, THCU, LABBARB    IMAGING  Mr Brain Wo Contrast 10/11/2015   1. 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 No associated hemorrhage or mass effect. The unilateral and cortical involvement argues against demyelinating disease which is felt to be the main differential consideration in this setting. 2. Negative noncontrast MRI appearance of the brain elsewhere. The patient declined IV contrast.   Mr Jodene Nam Head/brain Wo Cm 10/12/2015   Negative MRA head.   Carotid Doppler   pending     2D Echocardiogram  pending     PHYSICAL EXAM Pleasant middle-aged Hispanic lady not in distress. . Afebrile. Head is nontraumatic. Neck is supple without bruit.    Cardiac exam no murmur or gallop. Lungs are clear to auscultation. Distal pulses are well felt. Neurological Exam ;  Awake  Alert oriented x 3. Normal speech and language.eye movements full without nystagmus.fundi were not visualized. Vision  acuity and fields appear normal. Hearing is normal. Palatal movements are normal. Face symmetric. Tongue midline. Normal strength, tone, reflexes and coordination. Normal sensation. Gait deferred. ASSESSMENT/PLAN Ms. Karina Nofsinger is a 44 y.o. Spanish speaking [place] female with no significant past medical history presenting with right arm numbness. She did not receive IV t-PA due to unknown LKW, resolved symptoms.   Stroke:   Multiple right MCA  Infarcts, embolic secondary to unknown  source  Resultant  Mild HA  MRI  Multiple R MCA embolic infarcts  MRA  Unremarkable   Carotid Doppler  pending  2D Echo  pending   Recommend TEE to look for embolic source. can be arranged with [facility]  If TEE positive for PFO (patent foramen ovale), check bilateral lower extremity venous dopplers to rule out DVT as possible source of stroke.   Given her young age, recommend checking hypercoagulable tests (lupus anticoagulant, cardiolipin antibody) and vasculitic labs (C3, C4, CH50, ANA, ESR) as well as HIV and RPR as possible stroke sources.  LDL 139  HgbA1c pending  Lovenox 40 mg sq daily for VTE prophylaxis Diet Heart Room service appropriate?: Yes; Fluid consistency:: Thin  No antithrombotic prior to admission, now on aspirin 325 mg daily  Patient counseled to be compliant with her antithrombotic medications  Ongoing aggressive stroke risk factor management  Therapy recommendations:  No PT  Disposition:  Anticipate return home  Dr. Leonie Man spoke with resident via telephone about patient recommendations  Hyperlipidemia  LDL 139, goal LDL < 70   on no statin PTA  add statin  Low fat diet recommended  Admits to drinking a lot of coke, will change to coffee or tea  Other Active Problems  Liver masses seen on CTA 2 weeks ago, thought to be hemangiomas, for OP MRI   Supraclavicular fullness thought to be sort-tissue injury related to recent [facility] day # [place] for Pager information 10/12/2015 11:24 AM  I have personally examined this patient, reviewed notes, independently viewed imaging studies, participated in medical decision making and plan of care. I have made any additions or clarifications directly to the above note. Agree with note above. She presented with 9 day history of headaches with transient numbness and some nausea and MRI scan shows an embolic left MCA branch infarcts in  etiology to be determined. She remains at risk for neurological worsening, recurrent stroke, TIA needs ongoing stroke evaluation. She has no significant risk factors except hyperlipidemia which appears new. I recommend start aspirin and statin and evaluation for vasculitis, hypercoagulable panel and check TEE. I had a long discussion with the patient while her daughter and son who interpreted for her and answered questions. Also discussed internal medicine teaching service medical resident over the phone.  Antony Contras, MD Medical Director [facility] Pager: [phone] 10/12/2015 8:03 PM    To contact Stroke Continuity provider, please refer to [email or web address] After hours, contact General Neurology

## 2015-10-12 NOTE — Progress Notes (Signed)
Pt arrived on unit 2340 hrs, A&O, no obvious distress, NIHSS -0, admission orders implemented, Pt oriented to room and equipment.

## 2015-10-12 NOTE — Progress Notes (Signed)
Subjective: No acute events overnight.  Objective: Vital signs in last 24 hours: Filed Vitals:   10/12/15 0400 10/12/15 0600 10/12/15 0850 10/12/15 1327  BP: 117/83 113/82 114/74 111/66  Pulse: 97 99 98 83  Temp: 98.6 F (37 C)  98.6 F (37 C) 98.5 F (36.9 C)  TempSrc: Oral  Oral Oral  Resp: 15 16 16 16   Height:      Weight:      SpO2: 97% 98% 100% 96%   Weight change:   Intake/Output Summary (Last 24 hours) at 10/12/15 1629 Last data filed at 10/12/15 1100  Gross per 24 hour  Intake    720 ml  Output      0 ml  Net    720 ml   GENERAL- alert, co-operative, NAD HEENT- Atraumatic, PERRL, EOMI, oral mucosa appears moist CARDIAC- RRR, no murmurs, rubs or gallops. RESP- CTAB, no wheezes or crackles. NEURO- No obvious Cr N abnormality, strength upper and lower extremities- 5/5, Sensation intact- globally EXTREMITIES- pulse 2+, symmetric, no pedal edema. SKIN- Warm, dry, No rash or lesion. PSYCH- Normal mood and affect, appropriate thought content and speech.  Lab Results: Basic Metabolic Panel:  Recent Labs Lab 10/11/15 1312  NA 138  K 3.7  CL 105  CO2 26  GLUCOSE 102*  BUN 7  CREATININE 0.75  CALCIUM 9.4   Liver Function Tests: No results for input(s): AST, ALT, ALKPHOS, BILITOT, PROT, ALBUMIN in the last 168 hours. No results for input(s): LIPASE, AMYLASE in the last 168 hours. No results for input(s): AMMONIA in the last 168 hours. CBC:  Recent Labs Lab 10/11/15 1312  WBC 11.2*  NEUTROABS 7.4  HGB 13.6  HCT 41.3  MCV 89.4  PLT 381   Cardiac Enzymes: No results for input(s): CKTOTAL, CKMB, CKMBINDEX, TROPONINI in the last 168 hours. BNP: No results for input(s): PROBNP in the last 168 hours. D-Dimer: No results for input(s): DDIMER in the last 168 hours. CBG: No results for input(s): GLUCAP in the last 168 hours. Hemoglobin A1C: No results for input(s): HGBA1C in the last 168 hours. Fasting Lipid Panel:  Recent Labs Lab 10/12/15 0638   CHOL 207*  HDL 50  LDLCALC 139*  TRIG 89  CHOLHDL 4.1   Thyroid Function Tests: No results for input(s): TSH, T4TOTAL, FREET4, T3FREE, THYROIDAB in the last 168 hours. Coagulation: No results for input(s): LABPROT, INR in the last 168 hours. Anemia Panel: No results for input(s): VITAMINB12, FOLATE, FERRITIN, TIBC, IRON, RETICCTPCT in the last 168 hours. Urine Drug Screen: Drugs of Abuse  No results found for: LABOPIA, COCAINSCRNUR, LABBENZ, AMPHETMU, THCU, LABBARB  Alcohol Level: No results for input(s): ETH in the last 168 hours. Urinalysis: No results for input(s): COLORURINE, LABSPEC, PHURINE, GLUCOSEU, HGBUR, BILIRUBINUR, KETONESUR, PROTEINUR, UROBILINOGEN, NITRITE, LEUKOCYTESUR in the last 168 hours.  Invalid input(s): APPERANCEUR   Micro Results: No results found for this or any previous visit (from the past 240 hour(s)). Studies/Results: Mr Herby Abraham Contrast  10/11/2015  CLINICAL DATA:  44 year old female with unexplained headache and right upper extremity pain. Patient declined IV contrast administration. Initial encounter. EXAM: MRI HEAD WITHOUT CONTRAST TECHNIQUE: Multiplanar, multiecho pulse sequences of the brain and surrounding structures were obtained without intravenous contrast. COMPARISON:  Noncontrast head CT 01/01/2015. FINDINGS: Major intracranial vascular flow voids are within normal limits. Heterogeneous diffusion imaging in the left MCA territory superiorly and posteriorly. The largest lesion is 9 mm in the left corona radiata/ centrum semiovale (series 3, image 26.  This does not appear restricted on ADC. Suggestion of other scattered surrounding cortical and subcortical diffusion lesions, including 1 in the posterior left parietal lobe on series 3, image 22. None of these are definitely restricted on ADC. However, there is associated T2 and FLAIR hyperintensity in of the largest lesions, in including abnormal cortex at the left operculum as seen on series 6,  image 14 and series 10, image 13. No associated hemorrhage or mass effect. No contralateral right hemisphere or posterior fossa diffusion lesions. Pearline Cables and white matter signal in the right hemisphere in posterior fossa remain normal. No midline shift, mass effect, evidence of mass lesion, ventriculomegaly, extra-axial collection or acute intracranial hemorrhage. Cervicomedullary junction and pituitary are within normal limits. Visible internal auditory structures appear normal. Mastoids are clear. Paranasal sinuses are clear. Negative orbit and scalp soft tissues. Visualized bone marrow signal is within normal limits. Negative visualized cervical spine. IMPRESSION: 1. 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 No associated hemorrhage or mass effect. The unilateral and cortical involvement argues against demyelinating disease which is felt to be the main differential consideration in this setting. 2. Negative noncontrast MRI appearance of the brain elsewhere. The patient declined IV contrast. Electronically Signed   By: Genevie Ann M.D.   On: 10/11/2015 16:32   Mr Jodene Nam Head/brain Wo Cm  10/12/2015  CLINICAL DATA:  Followup brain MRI, RIGHT-sided weakness, acute known LEFT MCA territory infarcts. History of liver masses, migraines and stroke. EXAM: MRA HEAD WITHOUT CONTRAST TECHNIQUE: Angiographic images of the Circle of Willis were obtained using MRA technique without intravenous contrast. COMPARISON:  MRI of the head October 11, 2015 FINDINGS: Mild motion degraded examination. Anterior circulation: Normal flow related enhancement of the included cervical, petrous, cavernous and supra clinoid internal carotid arteries. Patent anterior communicating artery. Normal flow related enhancement of the anterior and middle cerebral arteries, including more distal segments. No large vessel occlusion, high-grade  stenosis, abnormal luminal irregularity, aneurysm. Posterior circulation: Codominant vertebral artery's. Basilar artery is patent, with normal flow related enhancement of the main branch vessels. Normal flow related enhancement of the posterior cerebral arteries. No large vessel occlusion, high-grade stenosis, abnormal luminal irregularity, aneurysm. IMPRESSION: Negative MRA head. Electronically Signed   By: Elon Alas M.D.   On: 10/12/2015 02:14   Medications: I have reviewed the patient's current medications. Scheduled Meds: . aspirin  300 mg Rectal Daily   Or  . aspirin  325 mg Oral Daily  . atorvastatin  40 mg Oral q1800  . enoxaparin (LOVENOX) injection  40 mg Subcutaneous QHS  . Influenza vac split quadrivalent PF  0.5 mL Intramuscular Tomorrow-1000   Continuous Infusions:  PRN Meds:.acetaminophen, senna-docusate Assessment/Plan: Multiple subacute infarcts in the left middle cerebral artery territory: Lesions separated by time and position without obvious cause in an otherwise healthy 44 y/o woman. Based on presentation, MRI interpretation, and discussion with Neuro seems like multiple recurrent small embolic events. No known history of blood clots, or history of skin symptoms or other vasculitis history. We will check for serology while awaiting complete imaging series in stroke workup. Her past episodes of face and arm numbness/weakness that spontaneously resolved could be somewhat suggestive of demyelinating process. -Neurology recs appreciated -Anti-phospholipid antibody panel -Hepatitis C and B panels -TEE tmrw -ASA 325mg  -Atorvastatin 40mg   Liver masses: Will obtain MRI while undergoing inpatient stroke workup. It is possible these are related as a cause of hypercoagulability  if concern for malignancy is reported on focused imaging of the liver. -MRI liver w/wo contrast  Dispo: Disposition is deferred at this time, awaiting improvement of current medical problems.  Anticipated discharge to home in 1-2 days.   The patient does not have a current PCP (No Pcp Per Patient) and does need an [facility] hospital follow-up appointment after discharge.  The patient does not know if she has transportation limitations that hinder transportation to clinic appointments.   LOS: 1 day   Collier Salina, MD 10/12/2015, 4:29 PM

## 2015-10-12 NOTE — Progress Notes (Signed)
  Echocardiogram 2D Echocardiogram has been performed.  Donata Clay 10/12/2015, 10:15 AM

## 2015-10-12 NOTE — Progress Notes (Signed)
Internal Medicine Attending:   I saw and examined the patient. I reviewed the resident's note and I agree with the resident's findings and plan as documented in the resident's note.  Neuro deficits have recovered and she is doing clinically well. We are pursuing an embolic CVA workup; plan for TEE tomorrow. Vasculitis workup so far unrevealing, she has no suggestive history, awaiting UA and ESR. Clinically she is doing well so we will continue secondary prevention and likely she will return home soon.

## 2015-10-12 NOTE — Progress Notes (Signed)
Dr called due pt's headache. He did order tylenol p.o. Pt to have an MRI of the liver.

## 2015-10-13 ENCOUNTER — Encounter (HOSPITAL_COMMUNITY)
Admission: EM | Disposition: A | Discharge status: Home / Self Care | Payer: Self-pay | Source: Home / Self Care | Attending: Student in an Organized Health Care Education/Training Program

## 2015-10-13 ENCOUNTER — Encounter (HOSPITAL_COMMUNITY): Payer: Self-pay

## 2015-10-13 ENCOUNTER — Inpatient Hospital Stay (HOSPITAL_COMMUNITY)
Admit: 2015-10-13 | Discharge: 2015-10-13 | Disposition: A | Discharge status: Home / Self Care | Payer: Medicaid Other | Attending: Physician Assistant | Admitting: Physician Assistant

## 2015-10-13 DIAGNOSIS — D1800 Hemangioma unspecified site: Secondary | ICD-10-CM

## 2015-10-13 DIAGNOSIS — I639 Cerebral infarction, unspecified: Secondary | ICD-10-CM

## 2015-10-13 DIAGNOSIS — E785 Hyperlipidemia, unspecified: Secondary | ICD-10-CM

## 2015-10-13 DIAGNOSIS — I288 Other diseases of pulmonary vessels: Secondary | ICD-10-CM

## 2015-10-13 HISTORY — PX: TEE WITHOUT CARDIOVERSION: SHX5443

## 2015-10-13 LAB — CBC
HCT: 44.1 % (ref 36.0–46.0)
Hemoglobin: 14.6 g/dL (ref 12.0–15.0)
MCH: 29.7 pg (ref 26.0–34.0)
MCHC: 33.1 g/dL (ref 30.0–36.0)
MCV: 89.6 fL (ref 78.0–100.0)
PLATELETS: 378 10*3/uL (ref 150–400)
RBC: 4.92 MIL/uL (ref 3.87–5.11)
RDW: 12.9 % (ref 11.5–15.5)
WBC: 10.4 10*3/uL (ref 4.0–10.5)

## 2015-10-13 LAB — HEPATITIS PANEL, ACUTE
HCV Ab: <0.1 s/co ratio (ref 0.0–0.9)
HEP A IGM: NEGATIVE
HEP B C IGM: NEGATIVE
Hepatitis B Surface Ag: NEGATIVE

## 2015-10-13 LAB — BASIC METABOLIC PANEL
ANION GAP: 9 (ref 5–15)
BUN: 13 mg/dL (ref 6–20)
CALCIUM: 9.3 mg/dL (ref 8.9–10.3)
CO2: 24 mmol/L (ref 22–32)
Chloride: 103 mmol/L (ref 101–111)
Creatinine, Ser: 0.9 mg/dL (ref 0.44–1.00)
GLUCOSE: 83 mg/dL (ref 65–99)
POTASSIUM: 3.9 mmol/L (ref 3.5–5.1)
SODIUM: 136 mmol/L (ref 135–145)

## 2015-10-13 LAB — HIV ANTIBODY (ROUTINE TESTING W REFLEX): HIV Screen 4th Generation wRfx: NONREACTIVE

## 2015-10-13 LAB — HEPATITIS B SURFACE ANTIGEN: HEP B S AG: NEGATIVE

## 2015-10-13 LAB — HEMOGLOBIN A1C
Hgb A1c MFr Bld: 5.6 % (ref 4.8–5.6)
Hgb A1c MFr Bld: 5.6 % (ref 4.8–5.6)
MEAN PLASMA GLUCOSE: 114 mg/dL
MEAN PLASMA GLUCOSE: 114 mg/dL

## 2015-10-13 LAB — HEPATITIS B CORE ANTIBODY, TOTAL: HEP B C TOTAL AB: NEGATIVE

## 2015-10-13 LAB — HEPATITIS B SURFACE ANTIBODY,QUALITATIVE: HEP B S AB: NONREACTIVE

## 2015-10-13 LAB — HEPATITIS C ANTIBODY

## 2015-10-13 SURGERY — ECHOCARDIOGRAM, TRANSESOPHAGEAL
Anesthesia: Moderate Sedation

## 2015-10-13 MED ORDER — ATORVASTATIN CALCIUM 40 MG PO TABS: 40.0000 mg | ORAL_TABLET | Freq: Every day | ORAL | Status: DC

## 2015-10-13 MED ORDER — MIDAZOLAM HCL 10 MG/2ML IJ SOLN
INTRAMUSCULAR | Status: DC | PRN
  Administered 2015-10-13 (×2): 2 mg via INTRAVENOUS

## 2015-10-13 MED ORDER — ASPIRIN 325 MG PO TABS: 325.0000 mg | ORAL_TABLET | Freq: Every day | ORAL | Status: DC

## 2015-10-13 MED ORDER — SODIUM CHLORIDE 0.9 % IV SOLN
INTRAVENOUS | Status: DC
  Administered 2015-10-13: 14:00:00 via INTRAVENOUS

## 2015-10-13 MED ORDER — DIPHENHYDRAMINE HCL 50 MG/ML IJ SOLN
INTRAMUSCULAR | Status: AC
  Filled 2015-10-13: qty 1

## 2015-10-13 MED ORDER — BUTAMBEN-TETRACAINE-BENZOCAINE 2-2-14 % EX AERO
INHALATION_SPRAY | CUTANEOUS | Status: DC | PRN
Start: 2015-10-13 — End: 2015-10-13
  Administered 2015-10-13: 2 via TOPICAL

## 2015-10-13 MED ORDER — FENTANYL CITRATE (PF) 100 MCG/2ML IJ SOLN
INTRAMUSCULAR | Status: DC | PRN
  Administered 2015-10-13 (×2): 25 ug via INTRAVENOUS

## 2015-10-13 MED ORDER — MIDAZOLAM HCL 5 MG/ML IJ SOLN
INTRAMUSCULAR | Status: AC
  Filled 2015-10-13: qty 2

## 2015-10-13 MED ORDER — FENTANYL CITRATE (PF) 100 MCG/2ML IJ SOLN
INTRAMUSCULAR | Status: AC
  Filled 2015-10-13: qty 2

## 2015-10-13 NOTE — H&P (View-Only) (Signed)
STROKE TEAM PROGRESS NOTE   SUBJECTIVE (INTERVAL HISTORY) No family at bedside. Pt asked about her test results. Has had nothing to eat since midnight. For TEE today. No more stroke symptoms.   OBJECTIVE Temp:  [98.3 F (36.8 C)-98.6 F (37 C)] 98.3 F (36.8 C) (11/18 0534) Pulse Rate:  [83-97] 90 (11/18 0534) Cardiac Rhythm:  [-] Sinus tachycardia (11/18 0705) Resp:  [16] 16 (11/18 0534) BP: (111-119)/(66-76) 115/70 mmHg (11/18 0534) SpO2:  [96 %-99 %] 99 % (11/18 0534)  CBC:   Recent Labs Lab 10/11/15 1312 10/13/15 0736  WBC 11.2* 10.4  NEUTROABS 7.4  --   HGB 13.6 14.6  HCT 41.3 44.1  MCV 89.4 89.6  PLT 381 [id number]    Basic Metabolic Panel:   Recent Labs Lab 10/11/15 1312 10/13/15 0736  NA 138 136  K 3.7 3.9  CL 105 103  CO2 26 24  GLUCOSE 102* 83  BUN 7 13  CREATININE 0.75 0.90  CALCIUM 9.4 9.3    Lipid Panel:     Component Value Date/Time   CHOL 207* 10/12/2015 0638   TRIG 89 10/12/2015 0638   HDL 50 10/12/2015 0638   CHOLHDL 4.1 10/12/2015 0638   VLDL 18 10/12/2015 0638   LDLCALC 139* 10/12/2015 0638   HgbA1c:  Lab Results  Component Value Date   HGBA1C 5.6 10/12/2015   Urine Drug Screen: No results found for: LABOPIA, COCAINSCRNUR, LABBENZ, AMPHETMU, THCU, LABBARB    IMAGING  Mr Brain Wo Contrast 10/11/2015   1. 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 No associated hemorrhage or mass effect. The unilateral and cortical involvement argues against demyelinating disease which is felt to be the main differential consideration in this setting. 2. Negative noncontrast MRI appearance of the brain elsewhere. The patient declined IV contrast.   Mr Jodene Nam Head/brain Wo Cm 10/12/2015   Negative MRA head.   Carotid Doppler   There is 1-39% bilateral ICA stenosis. Vertebral artery flow is antegrade.    2D Echocardiogram  - Left ventricle: The  cavity size was normal. Wall thickness wasincreased in a pattern of mild LVH. Systolic function was normal.The estimated ejection fraction was in the range of 55% to 60%.Although no diagnostic regional wall motion abnormality wasidentified, this possibility cannot be completely excluded on thebasis of this study. Doppler parameters are consistent withabnormal left ventricular relaxation (grade 1 diastolicdysfunction). - Aortic valve: There was mild regurgitation.  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 No suspicious hepatic lesions.  TEE pending    PHYSICAL EXAM Pleasant middle-aged Hispanic lady not in distress. . Afebrile. Head is nontraumatic. Neck is supple without bruit.    Cardiac exam no murmur or gallop. Lungs are clear to auscultation. Distal pulses are well felt. Neurological Exam ;  Awake  Alert oriented x 3. Normal speech and language.eye movements full without nystagmus.fundi were not visualized. Vision acuity and fields appear normal. Hearing is normal. Palatal movements are normal. Face symmetric. Tongue midline. Normal strength, tone, reflexes and coordination. Normal sensation. Gait deferred.   ASSESSMENT/PLAN Ms. Anjail Sanden is a 44 y.o. Spanish speaking [place] female with no significant past medical history presenting with right arm numbness. She did not receive IV t-PA due to unknown LKW, resolved symptoms.   Stroke:   Multiple right MCA  Infarcts, embolic secondary to unknown source  Resultant  Mild HA  MRI  Multiple R MCA embolic infarcts  MRA  Unremarkable   Carotid Doppler  No significant stenosis   2D Echo  No source of embolus  TEE to look for embolic source today.  If TEE positive for PFO (patent foramen ovale), check bilateral lower extremity venous dopplers to rule out DVT as possible source of stroke.   hypercoagulable tests and vasculitic labs ordered are either negative or  pending  LDL 139  HgbA1c 5.6, WNL  Lovenox 40 mg sq daily for VTE prophylaxis Diet NPO time specified  No antithrombotic prior to admission, now on aspirin 325 mg daily  Patient counseled to be compliant with her antithrombotic medications  Ongoing aggressive stroke risk factor management  Therapy recommendations:  No PT  Disposition:  Anticipate return home. Ok to discharge after TEE if no significant findings  Follow up Dr. Leonie Man in 2 months, stroke clinic, order written  Hyperlipidemia  LDL 139, goal LDL < 70   on no statin PTA  New lipitor 40 added this admission  Low fat diet recommended  Admits to drinking a lot of coke, recommended change to coffee or tea   Continue statin at discharge  Other Active Problems  Liver masses seen on CTA 2 weeks ago, confirmed to be hemangiomas  Supraclavicular fullness thought to be sort-tissue injury related to recent [facility] day # [place] for Pager information 10/13/2015 10:04 AM  I have personally examined this patient, reviewed notes, independently viewed imaging studies, participated in medical decision making and plan of care. I have made any additions or clarifications directly to the above note. Agree with note above.. Follow up as an outpatient in the stroke clinic in 2 months. Discussed with patient and Dr. Hanley Ben, MD Medical Director [place] Pager: [phone] 10/13/2015 11:52 AM  To contact Stroke Continuity provider, please refer to [email or web address] After hours, contact General Neurology

## 2015-10-13 NOTE — Discharge Instructions (Signed)
It was a pleasure taking care of you, Carla [name].   - Take Aspirin 325 mg daily and Lipitor 40 mg daily. These medicines are important to prevent a future stroke. - You will be called by the Internal Medicine Clinic to schedule a hospital follow up appointment. We can act as your primary care provider. Our clinic number is [phone] if you do not hear from [place] in the next week. - Please follow up with Neurology in 2 months.  Take care, Dr. Arcelia Jew  STROKE/TIA DISCHARGE INSTRUCTIONS SMOKING Cigarette smoking nearly doubles your risk of having a stroke & is the single most alterable risk factor  If you smoke or have smoked in the last 12 months, you are advised to quit smoking for your health.  Most of the excess cardiovascular risk related to smoking disappears within a year of stopping.  Ask you doctor about anti-smoking medications  [place] Quit Line: 1-800-QUIT NOW  Free Smoking Cessation Classes [phone]  CHOLESTEROL Know your levels; limit fat & cholesterol in your diet  Lipid Panel     Component Value Date/Time   CHOL 207* 10/12/2015 0638   TRIG 89 10/12/2015 0638   HDL 50 10/12/2015 0638   CHOLHDL 4.1 10/12/2015 0638   VLDL 18 10/12/2015 0638   LDLCALC 139* 10/12/2015 [id number]      Many patients benefit from treatment even if their cholesterol is at goal.  Goal: Total Cholesterol (CHOL) less than 160  Goal:  Triglycerides (TRIG) less than 150  Goal:  HDL greater than 40  Goal:  LDL (LDLCALC) less than 100   BLOOD PRESSURE American Stroke Association blood pressure target is less that 120/80 mm/Hg  Your discharge blood pressure is:  BP: 109/66 mmHg  Monitor your blood pressure  Limit your salt and alcohol intake  Many individuals will require more than one medication for high blood pressure  DIABETES (A1c is a blood sugar average for last 3 months) Goal HGBA1c is under 7% (HBGA1c is blood sugar average for last 3 months)  Diabetes: No known diagnosis of diabetes     Lab Results  Component Value Date   HGBA1C 5.6 10/12/2015     Your HGBA1c can be lowered with medications, healthy diet, and exercise.  Check your blood sugar as directed by your physician  Call your physician if you experience unexplained or low blood sugars.  PHYSICAL ACTIVITY/REHABILITATION Goal is 30 minutes at least 4 days per week  Activity: Increase activity slowly, Therapies: None   Activity decreases your risk of heart attack and stroke and makes your heart stronger.  It helps control your weight and blood pressure; helps you relax and can improve your mood.  Participate in a regular exercise program.  Talk with your doctor about the best form of exercise for you (dancing, walking, swimming, cycling).  DIET/WEIGHT Goal is to maintain a healthy weight  Your discharge diet is: Diet Heart Room service appropriate?: Yes; Fluid consistency:: Thin Diet - low sodium heart healthy thin liquids Your height is:  Height: 5\' 5"  (165.1 cm) Your current weight is: Weight: 63.957 kg (141 lb) Your Body Mass Index (BMI) is:  BMI (Calculated): 23.5  Following the type of diet specifically designed for you will help prevent another stroke.  Your goal Body Mass Index (BMI) is 19-24.  Healthy food habits can help reduce 3 risk factors for stroke:  High cholesterol, hypertension, and excess weight.  RESOURCES Stroke/Support Group:  Call [place] PROVIDED/REVIEWED  AND GIVEN TO PATIENT Stroke warning signs and symptoms How to activate emergency medical system (call 911). Medications prescribed at discharge. Need for follow-up after discharge. Personal risk factors for stroke. Pneumonia vaccine given: No Flu vaccine given: Yes, Date 10/13/2015 My questions have been answered, the writing is legible, and I understand these instructions.  I will adhere to these goals & educational materials that have been provided to me after my discharge from the hospital.     Dieta restringida en grasas y colesterol (Fat and Cholesterol Restricted Diet) El exceso de grasas y colesterol en la dieta puede causar problemas de [place]. Esta dieta lo ayudar a Theatre manager las grasas y Freight forwarder en los niveles normales para evitar enfermarse. QU TIPOS DE GRASAS DEBO ELEGIR?  Elija grasas monosaturadas y polinsaturadas. Estas se encuentran en alimentos como el aceite de oliva, aceite de canola, semillas de lino, nueces, almendras y semillas.  Consuma ms grasas omega-3. Las mejores opciones incluyen salmn, caballa, sardinas, atn, aceite de lino y semillas de lino molidas.  Limite el consumo de grasas saturadas, que se encuentran en productos de origen animal, como carnes, mantequilla y crema. Tambin pueden estar en productos vegetales, como aceite de palma, de palmiste y de coco.   Evite los alimentos con aceites parcialmente hidrogenados. Estos contienen grasas trans. Entre los ejemplos de alimentos con grasas trans se incluyen margarinas en barra, algunas margarinas untables, galletas dulces o saladas y otros productos horneados. [place]?   Lea las etiquetas de los alimentos. Busque las palabras "grasas trans" y "grasas saturadas".  Al preparar una comida:  Llene la mitad del plato con verduras y ensaladas de hojas verdes.  Llene un cuarto del plato con cereales integrales. Busque la palabra "integral" en Equities trader de la lista de ingredientes.  Llene un cuarto del plato con alimentos con protenas magras.  [place] a dos porciones por da. Elija frutas en lugar de jugos.  Coma ms alimentos con fibra soluble, por ejemplo, manzanas, brcoli, zanahorias, frijoles, guisantes y [place]. Trate de consumir de 20a 30g (gramos) de Conservator, museum/gallery.  Coma ms comidas caseras. Coma menos en los restaurantes y los bares.  Limite o evite el alcohol.  Limite los alimentos con alto contenido de almidn y Location manager.  Limite el  consumo de alimentos fritos.  Cocine los alimentos sin frerlos. Las opciones de coccin ms [place] son Teacher, music, Adult nurse, Interior and spatial designer y asar a Administrator, arts.  Baje de peso si es necesario. Aunque pierda Automatic Data, esto puede ser importante para la salud general. Tambin puede ayudar a prevenir enfermedades como diabetes y enfermedad cardaca. QU ALIMENTOS PUEDO COMER? Cereales Cereales integrales, como los panes de salvado o [place], las [place], los cereales y las pastas. Avena sin endulzar, trigo, [place], quinua o arroz integral. Tortillas de harina de maz o de salvado. Verduras Verduras frescas o congeladas (crudas, al vapor, asadas o grilladas). Ensaladas de hojas verdes. Lambert Mody Lambert Mody frescas, en conserva (en su jugo natural) o frutas congeladas. Carnes y otros productos con protenas Carne de res molida (al 85% o ms [place]), carne de res de animales alimentados con pastos o carne de res sin la grasa. Pollo o pavo sin piel. Carne de pollo o de [place]. Cerdo sin la grasa. Todos los pescados y frutos de mar. Huevos. Porotos, guisantes o lentejas secos. Frutos secos o semillas sin sal. Frijoles secos o en lata sin sal. Lcteos Productos lcteos con bajo contenido de [place], como [place]  o al 1%, quesos reducidos en grasas o al 2%, ricota con bajo contenido de grasas o [employer], o yogur natural con bajo contenido de [place]. Grasas y [employer] untables que no contengan grasas trans. [place] y condimentos para ensaladas livianos o reducidos en grasas. [place]. Aceites de oliva, canola, ssamo o crtamo. Mantequilla natural de cacahuate o almendra (elija la que no tenga agregado de aceite o azcar). Los artculos mencionados arriba pueden no ser [employer] de las bebidas o los alimentos recomendados. Comunquese con el nutricionista para conocer ms opciones. QU ALIMENTOS NO SE RECOMIENDAN? Cereales Pan blanco. Pastas blancas. Arroz blanco. Pan de  maz. Bagels, pasteles y croissants. Galletas saladas que contengan grasas trans. Verduras Papas blancas. [place] con crema o fritas. Verduras en [place]. Lambert Mody Frutas secas. Fruta enlatada en almbar liviano o espeso. Jugo de frutas. Carnes y otros productos con protenas [place] de carne con Lobbyist. Costillas, alas de pollo, tocineta, salchicha, mortadela, salame, chinchulines, tocino, perros calientes, salchichas alemanas y embutidos envasados. Hgado y otros rganos. Lcteos Leche entera o al 2%, crema, mezcla de [place] y crema, y queso crema. Quesos enteros. Yogur entero o endulzado. Quesos con toda su grasa. Cremas no lcteas y coberturas batidas. Quesos procesados, quesos para untar o cuajadas. Dulces y postres Jarabe de maz, azcares, miel y Control and instrumentation engineer. Caramelos. Mermelada y [place]. Chrissie Noa. Cereales endulzados. Galletas, pasteles, bizcochuelos, donas, muffins y helado. Grasas y aceites [place], [place] en barra, [place] de [place], [place], [place] clarificada o grasa de tocino. Aceites de coco, de palmiste o de palma. Bebidas Alcohol. Bebidas endulzadas (como refrescos, limonadas y bebidas frutales o ponches). Los artculos mencionados arriba pueden no ser [employer] de las bebidas y los alimentos que se Higher education careers adviser. Comunquese con el nutricionista para obtener ms informacin.   Esta informacin no tiene Marine scientist el consejo del mdico. Asegrese de hacerle al mdico cualquier pregunta que tenga.   Document Released: 11/11/2005 Document Revised: 12/02/2014 Elsevier Interactive Patient Education [employer].

## 2015-10-13 NOTE — Progress Notes (Signed)
STROKE TEAM PROGRESS NOTE   SUBJECTIVE (INTERVAL HISTORY) No family at bedside. Pt asked about her test results. Has had nothing to eat since midnight. For TEE today. No more stroke symptoms.   OBJECTIVE Temp:  [98.3 F (36.8 C)-98.6 F (37 C)] 98.3 F (36.8 C) (11/18 0534) Pulse Rate:  [83-97] 90 (11/18 0534) Cardiac Rhythm:  [-] Sinus tachycardia (11/18 0705) Resp:  [16] 16 (11/18 0534) BP: (111-119)/(66-76) 115/70 mmHg (11/18 0534) SpO2:  [96 %-99 %] 99 % (11/18 0534)  CBC:   Recent Labs Lab 10/11/15 1312 10/13/15 0736  WBC 11.2* 10.4  NEUTROABS 7.4  --   HGB 13.6 14.6  HCT 41.3 44.1  MCV 89.4 89.6  PLT 381 [id number]    Basic Metabolic Panel:   Recent Labs Lab 10/11/15 1312 10/13/15 0736  NA 138 136  K 3.7 3.9  CL 105 103  CO2 26 24  GLUCOSE 102* 83  BUN 7 13  CREATININE 0.75 0.90  CALCIUM 9.4 9.3    Lipid Panel:     Component Value Date/Time   CHOL 207* 10/12/2015 0638   TRIG 89 10/12/2015 0638   HDL 50 10/12/2015 0638   CHOLHDL 4.1 10/12/2015 0638   VLDL 18 10/12/2015 0638   LDLCALC 139* 10/12/2015 0638   HgbA1c:  Lab Results  Component Value Date   HGBA1C 5.6 10/12/2015   Urine Drug Screen: No results found for: LABOPIA, COCAINSCRNUR, LABBENZ, AMPHETMU, THCU, LABBARB    IMAGING  Mr Brain Wo Contrast 10/11/2015   1. 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 No associated hemorrhage or mass effect. The unilateral and cortical involvement argues against demyelinating disease which is felt to be the main differential consideration in this setting. 2. Negative noncontrast MRI appearance of the brain elsewhere. The patient declined IV contrast.   Mr Jodene Nam Head/brain Wo Cm 10/12/2015   Negative MRA head.   Carotid Doppler   There is 1-39% bilateral ICA stenosis. Vertebral artery flow is antegrade.    2D Echocardiogram  - Left ventricle: The  cavity size was normal. Wall thickness wasincreased in a pattern of mild LVH. Systolic function was normal.The estimated ejection fraction was in the range of 55% to 60%.Although no diagnostic regional wall motion abnormality wasidentified, this possibility cannot be completely excluded on thebasis of this study. Doppler parameters are consistent withabnormal left ventricular relaxation (grade 1 diastolicdysfunction). - Aortic valve: There was mild regurgitation.  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 No suspicious hepatic lesions.  TEE pending    PHYSICAL EXAM Pleasant middle-aged Hispanic lady not in distress. . Afebrile. Head is nontraumatic. Neck is supple without bruit.    Cardiac exam no murmur or gallop. Lungs are clear to auscultation. Distal pulses are well felt. Neurological Exam ;  Awake  Alert oriented x 3. Normal speech and language.eye movements full without nystagmus.fundi were not visualized. Vision acuity and fields appear normal. Hearing is normal. Palatal movements are normal. Face symmetric. Tongue midline. Normal strength, tone, reflexes and coordination. Normal sensation. Gait deferred.   ASSESSMENT/PLAN [name] is a 44 y.o. Spanish speaking [place] female with no significant past medical history presenting with right arm numbness. She did not receive IV t-PA due to unknown LKW, resolved symptoms.   Stroke:   Multiple right MCA  Infarcts, embolic secondary to unknown source  Resultant  Mild HA  MRI  Multiple R MCA embolic infarcts  MRA  Unremarkable   Carotid Doppler  No significant stenosis   2D Echo  No source of embolus  TEE to look for embolic source today.  If TEE positive for PFO (patent foramen ovale), check bilateral lower extremity venous dopplers to rule out DVT as possible source of stroke.   hypercoagulable tests and vasculitic labs ordered are either negative or  pending  LDL 139  HgbA1c 5.6, WNL  Lovenox 40 mg sq daily for VTE prophylaxis Diet NPO time specified  No antithrombotic prior to admission, now on aspirin 325 mg daily  Patient counseled to be compliant with her antithrombotic medications  Ongoing aggressive stroke risk factor management  Therapy recommendations:  No PT  Disposition:  Anticipate return home. Ok to discharge after TEE if no significant findings  Follow up Dr. Leonie Man in 2 months, stroke clinic, order written  Hyperlipidemia  LDL 139, goal LDL < 70   on no statin PTA  New lipitor 40 added this admission  Low fat diet recommended  Admits to drinking a lot of coke, recommended change to coffee or tea   Continue statin at discharge  Other Active Problems  Liver masses seen on CTA 2 weeks ago, confirmed to be hemangiomas  Supraclavicular fullness thought to be sort-tissue injury related to recent [facility] day # [place] for Pager information 10/13/2015 10:04 AM  I have personally examined this patient, reviewed notes, independently viewed imaging studies, participated in medical decision making and plan of care. I have made any additions or clarifications directly to the above note. Agree with note above.. Follow up as an outpatient in the stroke clinic in 2 months. Discussed with patient and Dr. Hanley Ben, MD Medical Director [place] Pager: [phone] 10/13/2015 11:52 AM  To contact Stroke Continuity provider, please refer to [email or web address] After hours, contact General Neurology

## 2015-10-13 NOTE — Progress Notes (Signed)
Patient discharged home with son per order for discharge. RN discussed discharge instructions including new medications, follow up appointments, signs and symptoms of stroke. Patient and son vocalized understanding. IV removed, tele removed. Patient agreed to [place], consent signed and faxed, order placed. Patient to be taken to car via wheelchair with staff. Neuro assessment unchanged, NIHHS 0.

## 2015-10-13 NOTE — Progress Notes (Signed)
Patient was able to provide Medicaid card this am. PCP listed on her card is [facility]. CM informed patient she needs to call and start the initial paperwork as a new patient for this practice.

## 2015-10-13 NOTE — CV Procedure (Signed)
See full TEE report in [facility]; normal LV function; negative saline microcavitation study. Tracy Kinner  

## 2015-10-13 NOTE — Progress Notes (Signed)
  Echocardiogram Echocardiogram Transesophageal has been performed.  Carla [name] 10/13/2015, 2:29 PM

## 2015-10-13 NOTE — Progress Notes (Signed)
Subjective: No acute events overnight. Feeling well this morning anticipating TEE later today. Walking without assistance, with no residual numbness or weakness from admission. She anticipates leaving to home after all tests are completed today.  Objective: Vital signs in last 24 hours: Filed Vitals:   10/12/15 2254 10/12/15 2302 10/13/15 0141 10/13/15 0534  BP: 118/76 112/70 119/72 115/70  Pulse: 97 90 89 90  Temp: 98.6 F (37 C) 98.4 F (36.9 C) 98.4 F (36.9 C) 98.3 F (36.8 C)  TempSrc: Oral Oral Oral Oral  Resp: 16 16 16 16   Height:      Weight:      SpO2: 99% 99% 98% 99%   Weight change:   Intake/Output Summary (Last 24 hours) at 10/13/15 0852 Last data filed at 10/12/15 1100  Gross per 24 hour  Intake    240 ml  Output      0 ml  Net    240 ml   GENERAL- alert, co-operative, NAD HEENT- Atraumatic, PERRL, EOMI, oral mucosa appears moist CARDIAC- RRR, no murmurs, rubs or gallops. RESP- CTAB, no wheezes or crackles. NEURO- No obvious Cr N abnormality, strength upper and lower extremities- 5/5, Sensation intact- globally EXTREMITIES- pulse 2+, symmetric, no pedal edema. SKIN- Warm, dry, No rash or lesion. PSYCH- Normal mood and affect, appropriate thought content and speech.  Lab Results: Basic Metabolic Panel:  Recent Labs Lab 10/11/15 1312 10/13/15 0736  NA 138 136  K 3.7 3.9  CL 105 103  CO2 26 24  GLUCOSE 102* 83  BUN 7 13  CREATININE 0.75 0.90  CALCIUM 9.4 9.3   Liver Function Tests: No results for input(s): AST, ALT, ALKPHOS, BILITOT, PROT, ALBUMIN in the last 168 hours. No results for input(s): LIPASE, AMYLASE in the last 168 hours. No results for input(s): AMMONIA in the last 168 hours. CBC:  Recent Labs Lab 10/11/15 1312 10/13/15 0736  WBC 11.2* 10.4  NEUTROABS 7.4  --   HGB 13.6 14.6  HCT 41.3 44.1  MCV 89.4 89.6  PLT 381 378   Cardiac Enzymes: No results for input(s): CKTOTAL, CKMB, CKMBINDEX, TROPONINI in the last 168  hours. BNP: No results for input(s): PROBNP in the last 168 hours. D-Dimer: No results for input(s): DDIMER in the last 168 hours. CBG: No results for input(s): GLUCAP in the last 168 hours. Hemoglobin A1C:  Recent Labs Lab 10/12/15 0638  HGBA1C 5.6   Fasting Lipid Panel:  Recent Labs Lab 10/12/15 0638  CHOL 207*  HDL 50  LDLCALC 139*  TRIG 89  CHOLHDL 4.1   Thyroid Function Tests: No results for input(s): TSH, T4TOTAL, FREET4, T3FREE, THYROIDAB in the last 168 hours. Coagulation: No results for input(s): LABPROT, INR in the last 168 hours. Anemia Panel: No results for input(s): VITAMINB12, FOLATE, FERRITIN, TIBC, IRON, RETICCTPCT in the last 168 hours. Urine Drug Screen: Drugs of Abuse  No results found for: LABOPIA, COCAINSCRNUR, LABBENZ, AMPHETMU, THCU, LABBARB  Alcohol Level: No results for input(s): ETH in the last 168 hours. Urinalysis:  Recent Labs Lab 10/12/15 2242  COLORURINE AMBER*  LABSPEC >1.046*  PHURINE 5.5  GLUCOSEU NEGATIVE  HGBUR SMALL*  BILIRUBINUR NEGATIVE  KETONESUR >80*  PROTEINUR NEGATIVE  NITRITE NEGATIVE  LEUKOCYTESUR NEGATIVE     Micro Results: No results found for this or any previous visit (from the past 240 hour(s)). Studies/Results: Mr Herby Abraham Contrast  10/11/2015  CLINICAL DATA:  44 year old female with unexplained headache and right upper extremity pain. Patient declined IV contrast  administration. Initial encounter. EXAM: MRI HEAD WITHOUT CONTRAST TECHNIQUE: Multiplanar, multiecho pulse sequences of the brain and surrounding structures were obtained without intravenous contrast. COMPARISON:  Noncontrast head CT 01/01/2015. FINDINGS: Major intracranial vascular flow voids are within normal limits. Heterogeneous diffusion imaging in the left MCA territory superiorly and posteriorly. The largest lesion is 9 mm in the left corona radiata/ centrum semiovale (series 3, image 26. This does not appear restricted on ADC. Suggestion of  other scattered surrounding cortical and subcortical diffusion lesions, including 1 in the posterior left parietal lobe on series 3, image 22. None of these are definitely restricted on ADC. However, there is associated T2 and FLAIR hyperintensity in of the largest lesions, in including abnormal cortex at the left operculum as seen on series 6, image 14 and series 10, image 13. No associated hemorrhage or mass effect. No contralateral right hemisphere or posterior fossa diffusion lesions. Pearline Cables and white matter signal in the right hemisphere in posterior fossa remain normal. No midline shift, mass effect, evidence of mass lesion, ventriculomegaly, extra-axial collection or acute intracranial hemorrhage. Cervicomedullary junction and pituitary are within normal limits. Visible internal auditory structures appear normal. Mastoids are clear. Paranasal sinuses are clear. Negative orbit and scalp soft tissues. Visualized bone marrow signal is within normal limits. Negative visualized cervical spine. IMPRESSION: 1. 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 No associated hemorrhage or mass effect. The unilateral and cortical involvement argues against demyelinating disease which is felt to be the main differential consideration in this setting. 2. Negative noncontrast MRI appearance of the brain elsewhere. The patient declined IV contrast. Electronically Signed   By: Genevie Ann M.D.   On: 10/11/2015 16:32   Mr Liver W Wo Contrast  10/12/2015  CLINICAL DATA:  Multiple liver lesions on CTA chest, favor hemangiomas EXAM: MRI ABDOMEN WITHOUT AND WITH CONTRAST TECHNIQUE: Multiplanar multisequence MR imaging of the abdomen was performed both before and after the administration of intravenous contrast. CONTRAST:  38mL MULTIHANCE GADOBENATE DIMEGLUMINE 529 MG/ML IV SOLN COMPARISON:  None. FINDINGS: Lower chest:  Lung bases  are clear. Hepatobiliary: No hepatic steatosis. 5.5 x 6.3 x 5.8 cm T2 hyperintense lesion with peripheral nodular discontinuous enhancement in the anterior segment right hepatic lobe (series 5/image 19), compatible with a benign hepatic hemangioma. Additional 6.8 x 9.9 x 7.7 cm T2 hyperintense lesion with peripheral nodular discontinuous enhancement in the posterior segment right hepatic lobe (series 5/image 11), compatible with a benign hepatic hemangioma. No suspicious hepatic lesions. Gallbladder is unremarkable. No intrahepatic or extrahepatic ductal dilatation. Pancreas: Within normal limits. Spleen: Within normal limits. Adrenals/Urinary Tract: Adrenal glands are within normal limits. Kidneys are within normal limits.  No hydronephrosis. Stomach/Bowel: Stomach and visualized bowel are unremarkable. Vascular/Lymphatic: No evidence abdominal aortic aneurysm. No suspicious abdominal lymphadenopathy. Other: No abdominal ascites. Musculoskeletal: Visualized thoracolumbar spine is within normal limits. 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 No suspicious hepatic lesions. Electronically Signed   By: Julian Hy M.D.   On: 10/12/2015 19:14   Mr Jodene Nam Head/brain Wo Cm  10/12/2015  CLINICAL DATA:  Followup brain MRI, RIGHT-sided weakness, acute known LEFT MCA territory infarcts. History of liver masses, migraines and stroke. EXAM: MRA HEAD WITHOUT CONTRAST TECHNIQUE: Angiographic images of the Circle of Willis were obtained using MRA technique without intravenous contrast. COMPARISON:  MRI of the head October 11, 2015 FINDINGS:  Mild motion degraded examination. Anterior circulation: Normal flow related enhancement of the included cervical, petrous, cavernous and supra clinoid internal carotid arteries. Patent anterior communicating artery. Normal flow related enhancement of the anterior and middle cerebral arteries, including more  distal segments. No large vessel occlusion, high-grade stenosis, abnormal luminal irregularity, aneurysm. Posterior circulation: Codominant vertebral artery's. Basilar artery is patent, with normal flow related enhancement of the main branch vessels. Normal flow related enhancement of the posterior cerebral arteries. No large vessel occlusion, high-grade stenosis, abnormal luminal irregularity, aneurysm. IMPRESSION: Negative MRA head. Electronically Signed   By: Elon Alas M.D.   On: 10/12/2015 02:14   Medications: I have reviewed the patient's current medications. Scheduled Meds: . aspirin  300 mg Rectal Daily   Or  . aspirin  325 mg Oral Daily  . atorvastatin  40 mg Oral q1800  . enoxaparin (LOVENOX) injection  40 mg Subcutaneous QHS  . 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 Hepatitis screening negative. Awaiting TEE today r/o additional embolic source investigation. If PFO identified we will check ultrasound for LEDVT. If cardiac thrombus or DVT we will start [facility] with NOAC. Else continue plan and d/c on aspirin, statin therapy. -Neurology recs appreciated -F/U APAS panel -TEE today -ASA 325mg  -Atorvastatin 40mg   Liver masses: 2 large hemangiomas observed on liver MRI. No suggestion of malignancy. These are a benign finding incidentally and unrelated to her ischemic CVAs.  Dispo: Disposition is deferred at this time, awaiting improvement of current medical problems. Anticipated discharge to home later today pending TTE.  The patient does not have a current PCP (No Pcp Per Patient) and does need an [facility] hospital follow-up appointment after discharge.  The patient does not know if she has transportation limitations that hinder transportation to clinic  appointments.   LOS: 2 days   Collier Salina, MD 10/13/2015, 8:52 AM

## 2015-10-13 NOTE — Interval H&P Note (Signed)
History and Physical Interval Note:  10/13/2015 2:05 PM  Carla [name]  has presented today for surgery, with the diagnosis of STROKE  The various methods of treatment have been discussed with the patient and family. After consideration of risks, benefits and other options for treatment, the patient has consented to  Procedure(s): TRANSESOPHAGEAL ECHOCARDIOGRAM (TEE) (N/A) as a surgical intervention .  The patient's history has been reviewed, patient examined, no change in status, stable for surgery.  I have reviewed the patient's chart and labs.  Questions were answered to the patient's satisfaction.     Kirk Ruths

## 2015-10-14 LAB — BETA-2-GLYCOPROTEIN I ABS, IGG/M/A
Beta-2 Glyco I IgG: <9 GPI IgG units (ref 0–20)
Beta-2-Glycoprotein I IgA: <9 GPI IgA units (ref 0–25)
Beta-2-Glycoprotein I IgM: <9 GPI IgM units (ref 0–32)

## 2015-10-14 LAB — CARDIOLIPIN ANTIBODIES, IGG, IGM, IGA
Anticardiolipin IgA: <9 APL U/mL (ref 0–11)
Anticardiolipin IgM: <9 MPL U/mL (ref 0–12)
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
## 2015-10-15 LAB — LUPUS ANTICOAGULANT PANEL
DRVVT: 36.8 s (ref 0.0–44.0)
PTT LA: 33.9 s (ref 0.0–40.6)

## 2015-10-16 ENCOUNTER — Encounter (HOSPITAL_COMMUNITY): Payer: Self-pay | Admitting: Cardiology

## 2015-10-16 LAB — ANTINUCLEAR ANTIBODIES, IFA: ANTINUCLEAR ANTIBODIES, IFA: NEGATIVE

## 2015-10-26 ENCOUNTER — Ambulatory Visit: Payer: Medicaid Other | Admitting: Family Medicine

## 2015-11-20 ENCOUNTER — Encounter (HOSPITAL_COMMUNITY): Payer: Self-pay | Admitting: *Deleted

## 2015-11-20 ENCOUNTER — Emergency Department (HOSPITAL_COMMUNITY)
Admission: EM | Admit: 2015-11-20 | Discharge: 2015-11-20 | Disposition: A | Discharge status: Home / Self Care | Payer: Medicaid Other | Attending: Emergency Medicine | Admitting: Emergency Medicine

## 2015-11-20 DIAGNOSIS — Z7982 Long term (current) use of aspirin: Secondary | ICD-10-CM | POA: Diagnosis not present

## 2015-11-20 DIAGNOSIS — Z8673 Personal history of transient ischemic attack (TIA), and cerebral infarction without residual deficits: Secondary | ICD-10-CM | POA: Diagnosis not present

## 2015-11-20 DIAGNOSIS — E785 Hyperlipidemia, unspecified: Secondary | ICD-10-CM | POA: Insufficient documentation

## 2015-11-20 DIAGNOSIS — Z79899 Other long term (current) drug therapy: Secondary | ICD-10-CM | POA: Diagnosis not present

## 2015-11-20 DIAGNOSIS — M25511 Pain in right shoulder: Secondary | ICD-10-CM | POA: Diagnosis not present

## 2015-11-20 DIAGNOSIS — M542 Cervicalgia: Secondary | ICD-10-CM | POA: Diagnosis present

## 2015-11-20 HISTORY — DX: Cerebral infarction, unspecified: I63.9

## 2015-11-20 LAB — CBC WITH DIFFERENTIAL/PLATELET
Basophils Absolute: 0 K/uL (ref 0.0–0.1)
Basophils Relative: 0 %
Eosinophils Absolute: 0.1 K/uL (ref 0.0–0.7)
Eosinophils Relative: 1 %
HCT: 40 % (ref 36.0–46.0)
Hemoglobin: 13.3 g/dL (ref 12.0–15.0)
Lymphocytes Relative: 18 %
Lymphs Abs: 1.7 K/uL (ref 0.7–4.0)
MCH: 30.1 pg (ref 26.0–34.0)
MCHC: 33.3 g/dL (ref 30.0–36.0)
MCV: 90.5 fL (ref 78.0–100.0)
Monocytes Absolute: 0.6 K/uL (ref 0.1–1.0)
Monocytes Relative: 6 %
Neutro Abs: 7.1 K/uL (ref 1.7–7.7)
Neutrophils Relative %: 75 %
Platelets: 299 K/uL (ref 150–400)
RBC: 4.42 MIL/uL (ref 3.87–5.11)
RDW: 12.7 % (ref 11.5–15.5)
WBC: 9.5 K/uL (ref 4.0–10.5)

## 2015-11-20 LAB — BASIC METABOLIC PANEL WITH GFR
Anion gap: 7 (ref 5–15)
BUN: 6 mg/dL (ref 6–20)
CO2: 25 mmol/L (ref 22–32)
Calcium: 9.1 mg/dL (ref 8.9–10.3)
Chloride: 109 mmol/L (ref 101–111)
Creatinine, Ser: 0.78 mg/dL (ref 0.44–1.00)
GFR calc Af Amer: >60 mL/min
GFR calc non Af Amer: >60 mL/min
Glucose, Bld: 135 mg/dL — ABNORMAL HIGH (ref 65–99)
Potassium: 3.9 mmol/L (ref 3.5–5.1)
Sodium: 141 mmol/L (ref 135–145)

## 2015-11-20 NOTE — Discharge Instructions (Signed)
Dolor sin causa aparente (Pain Without a Known Cause) QU ES EL DOLOR SIN CAUSA APARENTE? El dolor puede presentarse en cualquier parte del cuerpo y puede ser de leve a grave. En algunos casos, las causas del dolor se desconocen. Algunos tipos de dolor que pueden ocurrir sin causa aparente incluyen los siguientes:   Dolor de [place].  Dolor de espalda.  Dolor abdominal.  Dolor en el cuello. [place]?  Su mdico tratar de Office manager causa del dolor. Para esto, realizar lo siguiente:  Examen fsico.  Historia clnica.  Anlisis de [place].  Anlisis de [place].  Radiografas. Si no se descubre ninguna causa, el mdico puede diagnosticarle "dolor sin causa aparente".  HAY TRATAMIENTO PARA EL DOLOR SIN [place]?  El tratamiento depende del tipo de dolor que tenga. El mdico puede recetar medicamentos para ayudar a Glass blower/designer.  QU PUEDO HACER EN MI CASA PARA CONTROLAR EL DOLOR?   Tome los medicamentos solamente como se lo haya indicado el mdico.  Interrumpa las actividades que le causen dolor. En los momentos que sienta dolor intenso, haga reposo en cama.  Trate de reducir el estrs realizando actividades, como yoga o meditacin. Hable con su mdico para que le d otras recomendaciones sobre cmo reducir el estrs con la actividad fsica.  Haga ejercicio regularmente si el mdico lo autoriza.  Siga una dieta saludable que incluya frutas y verduras. Esto puede Theatre stage manager. Hable con el mdico si tiene alguna pregunta [employer]. QU PASA SI EL DOLOR NO MEJORA?  Si tiene Geophysical data processor y no se Physicist, medical de Chief Financial Officer o este [place], es importante que realice un seguimiento con su mdico. Puede ser necesario repetir las pruebas y Optometrist estudios ms profundos para hallar la posible causa.    Esta informacin no tiene Marine scientist el consejo del mdico. Asegrese de hacerle al mdico cualquier  pregunta que tenga.   Document Released: 08/21/2005 Document Revised: 12/02/2014 Elsevier Interactive Patient Education 2016 [place] msculoesqueltico (Musculoskeletal Pain) El dolor musculoesqueltico se siente en huesos y msculos. El dolor puede ocurrir en cualquier parte del cuerpo. El profesional que lo asiste podr tratarlo sin Pharmacist, community causa del dolor. Lo tratar Med[employer] de laboratorio (sangre y [place]), las radiografas y [place]. La causa de estos dolores puede ser un virus.  CAUSAS Generalmente no existe una causa definida para este trastorno. Tambin el [place] [employer] puede deberse a la Lilly. En la actividad excesiva se incluye el hacer ejercicios fsicos muy intensos cuando no se est en buena forma. El dolor de huesos tambin puede deberse a cambios climticos. Los huesos son sensibles a los cambios en la presin atmosfrica. [place]  Para proteger su privacidad, no se entregarn los [employer] pruebas por telfono. Asegrese de conseguirlos. Consulte el modo en que podr obtenerlos si no se lo han informado. Es su responsabilidad contar con los [employer].  Utilice los medicamentos de venta libre o de prescripcin para Conservation officer, historic buildings, Health and safety inspector o la [place], segn se lo indique el profesional que lo asiste. Si le han administrado medicamentos, no conduzca, no opere maquinarias ni Teacher, adult education, y tampoco firme documentos legales durante 24 horas. No beba alcohol. No tome pldoras para dormir ni otros medicamentos que Animal nutritionist.  Podr seguir con todas las actividades a menos que stas le ocasionen ms [employer]. Cuando  el dolor disminuya, es importante que gradualmente reanude toda la rutina habitual. Retome las actividades comenzando lentamente. Aumente gradualmente la intensidad y la duracin de sus actividades o del ejercicio.  Durante los  perodos de dolor intenso, el reposo en cama puede ser beneficioso. Recustese o sintese en la posicin que le sea ms cmoda.  Coloque hielo sobre la zona afectada.  Ponga hielo en [place].  Colquese una toalla entre la piel y la bolsa de hielo.  Aplique el hielo durante 10 a 20 minutos 3  4 veces por da.  Si el dolor empeora, o no desaparece puede ser [employer] repetir las pruebas o Optometrist nuevos exmenes. El profesional que lo asiste podr requerir investigar ms profundamente para Animator causa posible. SOLICITE ATENCIN MDICA DE INMEDIATO SI:  Siente que el dolor empeora y no se alivia con los medicamentos.  Siente dolor en el pecho asociado a falta de aire, sudoracin, nuseas o vmitos.  El dolor se localiza en el abdomen.  Comienza a sentir nuevos sntomas que parecen ser diferentes o que lo preocupan. ASEGRESE DE QUE:   Comprende las instrucciones para el alta mdica.  Controlar su enfermedad.  Solicitar atencin mdica de inmediato segn las indicaciones.   Esta informacin no tiene Marine scientist el consejo del mdico. Asegrese de hacerle al mdico cualquier pregunta que tenga.   Keep scheduled appointment with your neurologist. Continue taking home atorvastatin and aspirin. Return to the emergency department if you experience numbness or weakness in any extremity, severe headache, visual changes, loss of consciousness, chest pain, shortness of breath.

## 2015-11-20 NOTE — ED Notes (Signed)
Pt reports Rt sided neck pain  That radiates into Rt posterior shoulder . Pt reports Pain worse Last night.

## 2015-11-20 NOTE — ED Notes (Signed)
Declined W/C at D/C and was escorted to lobby by RN. 

## 2015-11-20 NOTE — ED Provider Notes (Signed)
CSN: [id number]     Arrival date & time 11/20/15  1004 History  By signing my name below, I, Carla [name], attest that this documentation has been prepared under the direction and in the presence of [employer], PA-C.  Electronically Signed: Hansel [name], ED Scribe. 11/20/2015. 11:09 AM.     Chief Complaint  Patient presents with  . Neck Pain  . Shoulder Pain   The history is provided by the patient. No language interpreter was used.    HPI Comments: Carla [name] is a 44 y.o. 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 Pt states that she had these same symptoms when she was admitted for stroke on 10/11/15 and was discharged with neck and shoulder pain. Pt is on qd  Lipitor for HLD and qd 325 mg ASA. Pt states no relief of her pains with the daily ASA. She denies HA, extremity weakness or numbness, vision changes, fever. No new trauma or injury  Past Medical History  Diagnosis Date  . Headache   . Stroke [facility])    Past Surgical History  Procedure Laterality Date  . Tee without cardioversion N/A 10/13/2015    Procedure: TRANSESOPHAGEAL ECHOCARDIOGRAM (TEE);  Surgeon: Lelon Perla, MD;  [facility] END[facility]COPY;  Service: Cardiovascular;  Laterality: N/A;   Family History  Problem Relation Age of Onset  . Asthma Neg Hx   . Cancer Neg Hx   . Diabetes Neg Hx   . Hyperlipidemia Neg Hx   . Heart failure Neg Hx   . Hypertension Neg Hx   . Migraines Neg Hx   . Migraines Neg Hx   . Rashes / Skin problems Neg Hx   . Seizures Neg Hx   . Stroke Neg Hx   . Thyroid disease Neg Hx    Social History  Substance Use Topics  . Smoking status: Never Smoker   . Smokeless tobacco: Never Used  . Alcohol Use: No   OB History    No data available     Review of Systems  Constitutional: Negative for fever.   Eyes: Negative for visual disturbance.  Musculoskeletal: Positive for myalgias and neck pain.  Neurological: Negative for weakness, numbness and headaches.  All other systems reviewed and are negative.  Allergies  Tramadol  Home Medications   Prior to Admission medications   Medication Sig Start Date End Date Taking? Authorizing Provider  aspirin 325 MG tablet Take 1 tablet (325 mg total) by mouth daily. 10/13/15  Yes Carly Montey Hora, MD  atorvastatin (LIPITOR) 40 MG tablet Take 1 tablet (40 mg total) by mouth daily at 6 PM. 10/13/15  Yes Carly J Rivet, MD   BP 132/84 mmHg  Pulse 96  Temp(Src) 97.9 F (36.6 C) (Oral)  Resp 16  SpO2 98%  LMP 11/20/2015 Physical Exam  Constitutional: She is oriented to person, place, and time. She appears well-developed and well-nourished. No distress.  HENT:  Head: Normocephalic and atraumatic.  Eyes: Conjunctivae are normal. Right eye exhibits no discharge. Left eye exhibits no discharge. No scleral icterus.  Neck: Normal range of motion. Neck supple. Normal carotid pulses and no JVD present. Carotid bruit is not present. No tracheal deviation present.    Cardiovascular: Normal rate, regular rhythm, normal heart sounds and intact distal pulses.  Exam reveals no gallop and no friction rub.   No murmur heard.  No carotid bruits.   Pulmonary/Chest: Effort normal and breath sounds normal. No respiratory distress. She has no wheezes. She has no rales. She exhibits no tenderness.  Lungs CTA bilaterally.   Lymphadenopathy:    She has no cervical adenopathy.  Neurological: She is alert and oriented to person, place, and time. She has normal reflexes. She displays normal reflexes. No cranial nerve deficit. She exhibits normal muscle tone. Coordination normal.  Strength 5/5 throughout. No sensory deficits. No facial droop. No pronator drift. No slurred speech.  Skin: Skin is warm and dry. No rash noted. She is not diaphoretic. No erythema. No pallor.   Psychiatric: She has a normal mood and affect. Her behavior is normal.  Nursing note and vitals reviewed.  ED Course  Procedures (including critical care time) DIAGNOSTIC STUDIES: Oxygen Saturation is 98% on RA, normal by my interpretation.    COORDINATION OF CARE: 11:07 AM Discussed treatment plan with pt at bedside and pt agreed to plan. Plan to consult with Dr. Tomi Bamberger.    Labs Review Labs Reviewed  BASIC METABOLIC PANEL - Abnormal; Notable for the following:    Glucose, Bld 135 (*)    All other components within normal limits  CBC WITH DIFFERENTIAL/PLATELET    I have personally reviewed and evaluated these lab results as part of my medical decision-making.  MDM   Final diagnoses:  Neck pain    Pt presents with R sided neck pain that has been present over 1 month. Pt had a stroke 1 month ago and at that time her presenting symptoms were RUE weakness, headache and neck pain that is similar to pain she has today. The neck pain has persisted since her discharge and is unchanged. She has no neurological deficits today. No headache. Pt had complete stroke workup while in the hospital <64month ago including carotid US which were normal. No carotid bruits on exam today. Doubt carotid dissection. Pt also had MRA head and CTA which were negative. As pts current symptoms are unchanged from previous, do not believe any additional imaging is necessary at this time. Pt is at her baseline, VSS. She is taking home atorvastatin and 325mg  ASA. Pt is scheduled to follow up with neurology and rheumatology in the next week. Basic labs collected today which are wnl. Feel that pt is safe for discharge with close follow up. Discussed treatment plan with pt who is agreeable. I have also discussed reasons to return to the ED. Case was discussed with Dr. Tomi Bamberger who agrees with the treatment plan.  I personally performed the services described in this documentation, which was scribed in my presence. The recorded  information has been reviewed and is accurate.     Dondra Spry [place], PA-C 11/21/15 [id number]  Dorie Rank, MD 11/22/15 [phone]

## 2015-11-22 ENCOUNTER — Encounter (HOSPITAL_COMMUNITY): Payer: Self-pay | Admitting: Emergency Medicine

## 2015-11-22 ENCOUNTER — Emergency Department (HOSPITAL_COMMUNITY)
Admission: EM | Admit: 2015-11-22 | Discharge: 2015-11-22 | Disposition: A | Discharge status: Home / Self Care | Payer: Medicaid Other | Source: Home / Self Care | Attending: Emergency Medicine | Admitting: Emergency Medicine

## 2015-11-22 DIAGNOSIS — M791 Myalgia, unspecified site: Secondary | ICD-10-CM

## 2015-11-22 MED ORDER — PRAVASTATIN SODIUM 40 MG PO TABS: 40.0000 mg | ORAL_TABLET | Freq: Every day | ORAL | Status: DC

## 2015-11-22 NOTE — ED Provider Notes (Signed)
CSN: [id number]     Arrival date & time 11/22/15  1307 History   First MD Initiated Contact with Patient 11/22/15 1426     Chief Complaint  Patient presents with  . Joint Pain   (Consider location/radiation/quality/duration/timing/severity/associated sxs/prior Treatment) HPI  She is a 44 year old woman here for evaluation of muscle aches. She reports persistent pain in her right neck and shoulder. This started about a month ago, at which time she was found to have multiple subacute infarcts. She was admitted and spent 3 days in the hospital for a stroke workup. She was discharged on aspirin and atorvastatin. She has been taking these medications, and the right neck and shoulder pain seems to be getting a little worse. She denies other muscle aches. She has been applying ice. She did get some ibuprofen at the drug store, but has not taken any yet.  Past Medical History  Diagnosis Date  . Headache   . Stroke [facility])    Past Surgical History  Procedure Laterality Date  . Tee without cardioversion N/A 10/13/2015    Procedure: TRANSESOPHAGEAL ECHOCARDIOGRAM (TEE);  Surgeon: Lelon Perla, MD;  Location[facility] ENDOSCOPY;  Service: Cardiovascular;  Laterality: N/A;   Family History  Problem Relation Age of Onset  . Asthma Neg Hx   . Cancer Neg Hx   . Diabetes Neg Hx   . Hyperlipidemia Neg Hx   . Heart failure Neg Hx   . Hypertension Neg Hx   . Migraines Neg Hx   . Migraines Neg Hx   . Rashes / Skin problems Neg Hx   . Seizures Neg Hx   . Stroke Neg Hx   . Thyroid disease Neg Hx    Social History  Substance Use Topics  . Smoking status: Never Smoker   . Smokeless tobacco: Never Used  . Alcohol Use: No   OB History    No data available     Review of Systems As in history of present illness Allergies  Tramadol  Home Medications   Prior to Admission medications   Medication Sig Start Date End Date Taking? Authorizing Provider  aspirin 325 MG tablet Take 1 tablet (325 mg  total) by mouth daily. 10/13/15   Juliet Rude, MD  pravastatin (PRAVACHOL) 40 MG tablet Take 1 tablet (40 mg total) by mouth daily. 11/22/15   Melony Overly, MD   Meds Ordered and Administered this Visit  Medications - No data to display  BP 134/66 mmHg  Pulse 82  Temp(Src) 97.9 F (36.6 C) (Oral)  LMP 11/20/2015 No data found.   Physical Exam  Constitutional: She is oriented to person, place, and time. She appears well-developed and well-nourished. No distress.  Cardiovascular: Normal rate.   Pulmonary/Chest: Effort normal.  Musculoskeletal:  She has swelling and tenderness to the right sternocleidomastoid muscle. She is also slightly tender in the right trapezius.  Neurological: She is alert and oriented to person, place, and time.    ED Course  Procedures (including critical care time)  Labs Review Labs Reviewed - No data to display  Imaging Review No results found.    MDM   1. Muscle ache    I will change her atorvastatin to pravastatin as this is less likely to cause muscle aches. It is a lower potency, but some statin is better than no statin. She will continue her aspirin daily. I reassured her that it is okay for her to take ibuprofen, but in limited doses. Follow-up as needed.  Melony Overly, MD 11/22/15 [phone]

## 2015-11-22 NOTE — ED Notes (Signed)
Here with progressive joint pain after taking Atorvastatin 40 mg daily s/p Stroke 11/26 Denies chest pain or heart related issues  Taking Aspirin 325 mg tab and fish oil otc with noticed joint pain

## 2015-11-22 NOTE — Discharge Instructions (Signed)
Your pain is coming from the muscles in your neck and shoulder. The cholesterol medicine, atorvastatin, can make this worse. Please stop taking atorvastatin. Start taking pravastatin. This is a cholesterol medicine, but it is less likely to cause muscle aches. Continue the aspirin daily. It is okay to take the over-the-counter ibuprofen. Do not take more than 2 tablets every 4 hours. Follow-up as needed.  Su dolor viene de los msculos de su cuello y hombro. El medicamento para el colesterol, atorvastatina, puede empeorar esto. Por favor, deje de tomar atorvastatina. Comience a tomar pravastatina. Este es un medicamento para el colesterol, pero es menos probable que cause dolores musculares. Contine la aspirina diariamente. Est bien tomar el ibuprofeno de [employer]. No tome ms de 2 comprimidos cada 4 horas. Seguimiento segn sea necesario.

## 2015-11-26 ENCOUNTER — Encounter (HOSPITAL_COMMUNITY): Payer: Self-pay | Admitting: *Deleted

## 2015-11-26 ENCOUNTER — Emergency Department (HOSPITAL_COMMUNITY)
Admission: EM | Admit: 2015-11-26 | Discharge: 2015-11-26 | Disposition: A | Discharge status: Home / Self Care | Payer: Medicaid Other | Source: Home / Self Care

## 2015-11-26 DIAGNOSIS — M62838 Other muscle spasm: Secondary | ICD-10-CM | POA: Diagnosis not present

## 2015-11-26 MED ORDER — CYCLOBENZAPRINE HCL 10 MG PO TABS: 10.0000 mg | ORAL_TABLET | Freq: Two times a day (BID) | ORAL | Status: DC | PRN

## 2015-11-26 NOTE — Discharge Instructions (Signed)
Terapia con calor (Heat Therapy) La terapia con calor puede ayudar a aliviar articulaciones y msculos doloridos, lesionados, tensos y rgidos. El calor [employer], lo cual puede ayudar a Best boy. La terapia con calor solo se debe usar en lesiones viejas, preexistentes o de larga duracin (crnicas). No use la terapia con calor a menos que se lo haya indicado el mdico. CMO USAR LA TERAPIA CON CALOR Existen varios tipos distintos de terapia con calor, como:  Compresas hmedas calientes.  Bao de agua caliente.  Bolsa de agua caliente.  Almohadilla trmica.  Bolsa de gel caliente.  Vendaje caliente.  Almohadilla trmica. RECOMENDACIONES GENERALES PARA LA TERAPIA CON CALOR   No duerma mientras [place] la terapia con calor. Utilice la terapia con calor solo mientras est despierto.  La piel puede volverse rosada mientras [place] la terapia con calor. No use la terapia con calor si la piel se pone roja.  No use la terapia con calor si siente un dolor nuevo.  Una temperatura muy alta o una exposicin prolongada al calor puede causar quemaduras. Sea cauto con la terapia de calor para evitar quemar la piel.  No use la terapia con calor en zonas de la piel que ya estn irritadas, como con una erupcin o una quemadura de sol. SOLICITE AYUDA SI:   Observa ampollas, enrojecimiento, hinchazn o adormecimiento.  Siente un dolor nuevo.  El dolor [place]. ASEGRESE DE QUE:  Comprende estas instrucciones.  Controlar su afeccin.  Recibir ayuda de inmediato si no mejora o si empeora.   Esta informacin no tiene Marine scientist el consejo del mdico. Asegrese de hacerle al mdico cualquier pregunta que tenga.   Document Released: 02/03/2012 Document Revised: 12/02/2014 Elsevier Interactive Patient Education 2016 [place] y espasmos musculares  (Muscle Cramps and Spasms)  Los calambres musculares y espasmos ocurren cuando un msculo o grupos de  msculos se tensan y no se tiene control sobre esta tensin (contraccin muscular involuntaria). Es un problema comn y Audiological scientist en cualquier msculo. La zona ms comn son los msculos de la pantorrilla. Tanto los [employer] espasmos son contracciones musculares involuntarias, pero tambin tienen diferencias:   Los calambres musculares son espordicos y Soil scientist. Pueden durar entre algunos segundos hasta un cuarto de [place]. Los calambres musculares son ms fuertes y duran ms que los espasmos musculares.  Los espasmos pueden o no ser dolorosos. Pueden durar algunos segundos o mucho ms. CAUSAS  No es frecuente que los calambres se deban a un trastorno subyacente grave. En muchos casos, la causa de los calambres y los espasmos es desconocida. Algunas causas frecuentes son:   Esfuerzo excesivo.   El uso excesivo del msculo por movimientos repetitivos (hacer lo mismo una y Elmon Kirschner).   Permanecer en cierta posicin durante un largo perodo de [place].   Preparacin, forma o tcnica inadecuada al realizar un deporte o [place].   Deshidratacin.   Traumatismos.   Efectos secundarios de algunos medicamentos.  Niveles anormalmente bajos de las sales e iones en la sangre (electrolitos), especialmente el potasio y el calcio. Pueden ocurrir cuando se toman pldoras para Garment/textile technologist (diurticos) o en las mujeres embarazadas.  Algunos problemas mdicos subyacentes pueden hacer que sea ms propenso a desarrollar calambres o espasmos. Estos incluyen, pero no se limitan a:   Diabetes.   Enfermedad de Parkinson.   Trastornos hormonales, tales como problemas de la tiroides.   El consumo excesivo de alcohol.   Enfermedades especficas de [employer], las articulaciones  y los huesos.   Enfermedad vascular en la que no llega suficiente sangre a los msculos.  INSTRUCCIONES PARA EL CUIDADO EN EL HOGAR   Mantngase bien hidratado. Beba gran cantidad de lquido para mantener  la orina de tono claro o color amarillo plido.  Puede ser til Community education officer, Neurosurgeon y Media planner el msculo afectado.  Para los msculos tensos o apretados, use una toalla caliente, una almohadilla trmica o agua caliente de la ducha dirigida a la zona afectada.  Si est dolorido o siente dolor despus de un calambre o espasmo, aplique hielo en el rea afectada para Runner, broadcasting/film/video.  Ponga el hielo en una bolsa plstica.  Colquese una toalla entre la piel y la bolsa de hielo.  Deje el hielo en el lugar durante 15 a 20 minutos, 3 a 4 veces por da.  Los medicamentos que se utilizan para tratar las causas conocidas de los calambres o espasmos pueden reducir su frecuencia o gravedad. Tome slo medicamentos de venta libre o recetados, segn las indicaciones del mdico. SOLICITE ATENCIN MDICA SI:  Los calambres o espasmos empeoran, ocurren con ms frecuencia o no mejoran con Physiological scientist.  ASEGRESE DE QUE:   Comprende estas instrucciones.  Controlar su enfermedad.  Solicitar ayuda de inmediato si no mejora o si empeora.   Esta informacin no tiene Marine scientist el consejo del mdico. Asegrese de hacerle al mdico cualquier pregunta que tenga.   Document Released: 08/21/2005 Document Revised: 03/08/2013 Elsevier Interactive Patient Education [employer].

## 2015-11-26 NOTE — ED Provider Notes (Signed)
CSN: [id number]     Arrival date & time 11/26/15  1306 History   None    Chief Complaint  Patient presents with  . Neck Pain   (Consider location/radiation/quality/duration/timing/severity/associated sxs/prior Treatment) HPI 45 y/o female with right trapezius muscle discomfort for over a month. Was recently seen in UC and had her cholesterol medication changed. No improvement of symptoms at this time. Denies any leg pain at all. No other muscle pain. Recent hx of CVE. Treated symptomatically. Has appointment with neurology clinic 1/18. PCP 1/9 Past Medical History  Diagnosis Date  . Headache   . Stroke [facility])     09/2015   Past Surgical History  Procedure Laterality Date  . Tee without cardioversion N/A 10/13/2015    Procedure: TRANSESOPHAGEAL ECHOCARDIOGRAM (TEE);  Surgeon: Lelon Perla, MD;  Location: [facility] ENDOSCOPY;  Service: Cardiovascular;  Laterality: N/A;  . Right arm surgery     Family History  Problem Relation Age of Onset  . Asthma Neg Hx   . Cancer Neg Hx   . Diabetes Neg Hx   . Hyperlipidemia Neg Hx   . Heart failure Neg Hx   . Hypertension Neg Hx   . Migraines Neg Hx   . Migraines Neg Hx   . Rashes / Skin problems Neg Hx   . Seizures Neg Hx   . Stroke Neg Hx   . Thyroid disease Neg Hx    Social History  Substance Use Topics  . Smoking status: Never Smoker   . Smokeless tobacco: Never Used  . Alcohol Use: No   OB History    No data available     Review of Systems ROS +'ve right neck muscle pain  Denies: HEADACHE, NAUSEA, ABDOMINAL PAIN, CHEST PAIN, CONGESTION, DYSURIA, SHORTNESS OF BREATH  Allergies  Tramadol  Home Medications   Prior to Admission medications   Medication Sig Start Date End Date Taking? Authorizing Provider  aspirin 325 MG tablet Take 1 tablet (325 mg total) by mouth daily. 10/13/15  Yes Carly Montey Hora, MD  pravastatin (PRAVACHOL) 40 MG tablet Take 1 tablet (40 mg total) by mouth daily. 11/22/15  Yes Melony Overly, MD   cyclobenzaprine (FLEXERIL) 10 MG tablet Take 1 tablet (10 mg total) by mouth 2 (two) times daily as needed for muscle spasms. 11/26/15   Konrad Felix, PA   Meds Ordered and Administered this Visit  Medications - No data to display  BP 126/74 mmHg  Pulse 99  Temp(Src) 98.3 F (36.8 C) (Oral)  Resp 20  SpO2 98%  LMP 11/20/2015 (Approximate) No data found.   Physical Exam  Constitutional: She appears well-developed and well-nourished.  HENT:  Head: Normocephalic and atraumatic.  Right Ear: External ear normal.  Left Ear: External ear normal.  Mouth/Throat: Oropharynx is clear and moist. No oropharyngeal exudate.  Eyes: Conjunctivae are normal.  Neck: Trachea normal and full passive range of motion without pain. Decreased range of motion present.  Tenderness with palpable spasm right trapezius.  No neurological changes.     ED Course  Procedures (including critical care time)  Labs Review Labs Reviewed - No data to display  Imaging Review No results found.   Visual Acuity Review  Right Eye Distance:   Left Eye Distance:   Bilateral Distance:    Right Eye Near:   Left Eye Near:    Bilateral Near:         MDM   1. Muscle spasm    No indication for  change of medications at this time. Will use muscle relaxer (flexeril). Also application of heat. Keep appointments as they are already made.  Return if there are new or worsening of symptoms.     Konrad Felix, PA 11/26/15 1436

## 2015-11-26 NOTE — ED Notes (Addendum)
Reports having CVA November.  States since then, she doesn't feel well; c/o upper posterior neck pain, bilateral neck pain radiating into trapezius area and across upper back.  C/O painful movements.  Has continued with slight HA since CVA, though much improved.  Denies any lasting effects from CVA.  Has been taking Rx ASA as prescribed.  Was seen 12/26 & 12/28 for neck pain.

## 2015-12-04 ENCOUNTER — Ambulatory Visit: Payer: Medicaid Other | Admitting: Family Medicine

## 2015-12-11 ENCOUNTER — Ambulatory Visit (INDEPENDENT_AMBULATORY_CARE_PROVIDER_SITE_OTHER): Payer: Medicaid Other | Admitting: Family Medicine

## 2015-12-11 ENCOUNTER — Encounter: Payer: Self-pay | Admitting: Family Medicine

## 2015-12-11 VITALS — BP 120/85 | HR 93 | Temp 97.8°F | Resp 18 | Ht 63.0 in | Wt 127.0 lb

## 2015-12-11 DIAGNOSIS — T148 Other injury of unspecified body region: Secondary | ICD-10-CM

## 2015-12-11 DIAGNOSIS — Z7689 Persons encountering health services in other specified circumstances: Secondary | ICD-10-CM

## 2015-12-11 DIAGNOSIS — I639 Cerebral infarction, unspecified: Secondary | ICD-10-CM | POA: Diagnosis not present

## 2015-12-11 DIAGNOSIS — IMO0001 Reserved for inherently not codable concepts without codable children: Secondary | ICD-10-CM

## 2015-12-11 DIAGNOSIS — Z7189 Other specified counseling: Secondary | ICD-10-CM

## 2015-12-11 MED ORDER — PRAVASTATIN SODIUM 40 MG PO TABS: 40.0000 mg | ORAL_TABLET | Freq: Every day | ORAL | Status: DC

## 2015-12-11 MED ORDER — ASPIRIN 325 MG PO TABS: 325.0000 mg | ORAL_TABLET | Freq: Every day | ORAL | Status: DC

## 2015-12-11 NOTE — Patient Instructions (Signed)
Document Released: 09/08/2009 Document Revised: 12/02/2014 Elsevier Interactive Patient Education 2016 [place] cervical (Cervical Sprain) La distensin cervical se produce cuando los tejidos (ligamentos) del cuello se estiran o se rompen. CUIDADOS EN EL HOGAR   Aplique hielo sobre la zona lesionada.  Ponga el hielo en una bolsa plstica.  Colquese una toalla entre la piel y la bolsa de hielo.  Deje el hielo durante 15 - 20 minutos y aplquelo 3 - 4 veces por Training and development officer.  Es posible que le hayan indicado el uso de un collarn. Este collarn impide que el cuello se mueva mientras se [place].  No se lo quite excepto que se lo indique el mdico.  Si tiene el cabello largo, mantngalo fuera del collarn.  Consulte a su mdico antes de cambiar la posicin del collarn. Es posible que necesite cambiar la posicin con el tiempo para sentirse ms cmodo.  Si le permiten quitarse el collarn para lavarlo o darse un bao, siga las indicaciones de su mdico acerca de cmo hacerlo con seguridad.  Mantenga el collarn limpio pasando un pao con agua y [place]. Squelo bien. Si el collarn tiene almohadillas removibles, qutelas cada 1-2 das para lavarlas a mano con agua y [place]. Deje que se sequen al aire. Debe secarlas bien antes de volver a colocarlas en el collarn.  No conduzca vehculos mientras [place] el collarn.  Slo tome los medicamentos que le haya indicado su mdico.  Cumpla con las visitas al mdico segn las indicaciones.  Cumpla con las sesiones de fisioterapia, segn las indicaciones.  Ajuste su mesa de trabajo de modo que tenga una buena postura al trabajar.  Evite las posiciones y actividades que [employer] sntomas.  Haga un precalentamiento y elongacin adecuados antes de la [place]. SOLICITE AYUDA SI:  El dolor no se alivia con los [employer].  No puede disminuir las dosis de medicamentos para el dolor luego de un tiempo, segn lo planificado.  Su  nivel de actividad no mejora segn lo esperado. SOLICITE AYUDA DE INMEDIATO SI:   Tiene sangrado.  Siente Engineer, site.  Tiene alguna reaccin a los medicamentos.  Tiene sntomas nuevos que no puede explicar.  Pierde la sensibilidad (adormecimiento) o no Social worker del cuerpo (parlisis).  Siente hormigueo o debilidad en alguna parte del cuerpo.  Los sntomas empeoran. Los sntomas son:  Dolor, sensibilidad, rigidez, inflamacin(hinchazn), o sensacin de ardor en el cuello.  Siente dolor cuando le tocan el cuello.  Dolor en el hombro o la zona superior de la espalda.  Capacidad limitada para mover el cuello.  Dolor de [place].  Mareos.  Debilidad, falta de sensibilidad o sensacin hormigueos en las manos o los brazos.  Espasmos musculares.  Dificultades para tragar o comer. ASEGRESE DE QUE:   Comprende estas instrucciones.  Controlar su afeccin.  Recibir ayuda de inmediato si no mejora o si empeora.   Esta informacin no tiene Marine scientist el consejo del mdico. Asegrese de hacerle al mdico cualquier pregunta que tenga.   Document Released: 10/31/2011 Document Revised: 07/14/2013 Elsevier Interactive Patient Education 2016 [place] alimentacin cardiosaludable (Heart-Healthy Eating Plan) Muchos factores influyen en la salud cardaca, entre ellos, los hbitos de alimentacin y de ejercicio fsico. El riesgo cardaco (coronario) aumenta con los niveles anormales de grasa (lpidos) en la [place]. La planificacin de las comidas cardiosaludables incluye limitar las grasas poco saludables, aumentar las grasas saludables y Field seismologist otros cambios pequeos en la dieta, por [place], Cisco  un peso corporal saludable para ayudar a que los niveles de lpidos se mantengan dentro de un rango normal. EN QU CONSISTE EL PLAN?  El mdico le recomienda que:  No consuma ms del __________% de las caloras totales en su ingesta  diaria de grasa.  Limite su ingesta de grasas saturadas a menos del ______% de las caloras totales diarias.  Limite la cantidad de colesterol en su dieta a menos de _________mg Darlin Coco. QU TIPOS DE GRASAS DEBO ELEGIR?  Elija grasas saludables con mayor frecuencia. Elija las grasas monoinsaturadas y poliinsaturadas, como el aceite de oliva y canola, las semillas de [place], las nueces, las almendras y las semillas.  Consuma ms grasas omega-3. Las mejores opciones incluyen salmn, caballa, sardinas, atn, aceite de lino y semillas de lino molidas. Propngase comer pescado al [employer] veces por semana.  Limite el consumo de grasas saturadas. Estas se encuentran principalmente en los productos de origen animal, como las carnes, la [place] y la crema. Las grasas saturadas de origen vegetal incluyen aceite de palma, de palmiste y de coco.  Evite los alimentos con aceites parcialmente hidrogenados. Estos contienen grasas trans. Entre los ejemplos de alimentos con grasas trans se incluyen margarinas en barra, algunas margarinas untables, galletas dulces o saladas y otros productos horneados. [place]?  Lea las etiquetas de los alimentos detenidamente para identificar los que contienen grasas trans o altas cantidades de grasas saturadas.  Llene la mitad del plato con verduras y ensaladas de hojas verdes. Coma 4 o 5porciones de verduras Market researcher. Una porcin de verduras equivale a 1taza de verduras de hoja crudas, taza de verduras en trozos crudas o cocidas, o taza de jugo de verduras.  Llene un cuarto del plato con cereales integrales. Busque la palabra "integral" en Equities trader de la lista de ingredientes.  Llene un cuarto del plato con alimentos con protenas magras.  Coma 4 o 5porciones de frutas por da. Una porcin de frutas equivale a una fruta mediana entera; taza de fruta disecada; taza de frutas frescas, congeladas o enlatadas, o taza de jugo  100% de fruta.  Consuma ms alimentos con fibra soluble, por ejemplo, manzanas, brcoli, zanahorias, frijoles, guisantes y [place]. Trate de consumir de 20a 30g de [employer].  Consuma ms comida casera y menos de restaurante, de buf y comida rpida.  Limite o evite el alcohol.  Limite los alimentos con alto contenido de almidn y Location manager.  Evite las comidas fritas.  Cocine los alimentos utilizando mtodos que no sean la fritura. Las opciones de coccin ms [place] son Teacher, music, Adult nurse, Interior and spatial designer y asar a Administrator, arts. Otras sugerencias para reducir las grasas incluyen lo siguiente:  Quite la piel de las aves.  Quite todas las grasas visibles de las carnes.  Espume la grasa de los guisos, las sopas y las salsas antes de servirlos.  Cocine al vapor las verduras en agua o caldo.  Baje de peso si es necesario. Perder solo del 5 al 10% de su peso inicial puede ayudarle a mejorar su estado de salud general y a Producer, television/film/video, como la diabetes y las enfermedades cardacas.  Aumente el consumo de frutos secos, legumbres y semillas a 4 o 5porciones por semana. Una porcin de frijoles o legumbres secos equivale a taza despus de su coccin, una porcin de frutos secos equivale a 1onzas y [place] porcin de semillas equivale a onza o 1cucharada.  Es posible que deba controlar la ingesta de sal (sodio), especialmente  si tiene hipertensin arterial. Hable con el mdico o el nutricionista para obtener ms informacin sobre cmo reducir [employer]. QU ALIMENTOS PUEDO COMER? Cereales Panes, incluido el pan francs, blanco, pita, de [place], de pasas de [place], de centeno, de avena e [place]. Tortillas que no estn fritas ni elaboradas con manteca de cerdo ni grasas trans. Panecillos bajos en grasas, incluidos los panes para perros calientes y [place], y los bollitos tipo ingls. Galletas. Muffins. Waffles. Panqueques. Palomitas de maz con bajo contenido calrico. Cereales integrales.  Pan sin levadura. Tostada Melba. Pretzels. Palitos de pan. Galletas. Galletitas y TEFL teacher en grasas, entre ellas, las que tienen forma de [place], las [place], el pan cimo, las [place], las que tienen forma de [place] y las de centeno. Arroz y pastas, incluido el arroz integral y las pastas elaboradas con cereales integrales. [place] verduras. Frutas Todas las frutas, pero limite el [place]. [place]itchie y [place] fuentes de protenas Carne de res, [place], cerdo y cordero magras sin grasa. Pollo y pavo sin piel. Todos los pescados y [employer]. Pato salvaje, conejo, faisn y venado. Claras de huevo o sustitutos del huevo bajos en colesterol. Porotos, guisantes, lentejas secos y tofu.Semillas y la mayora de los frutos secos. Lcteos Quesos descremados y semidescremados, entre ellos, ricota, queso en hebras y Artist. Leche descremada o al 1% que sea lquida, en polvo o evaporada. [place]. Yogur descremado o bajo en grasas. Bebidas Agua mineral. Bebidas gaseosas dietticas. Dulces y postres Sorbetes y helados de fruta. [place], [place], [place], jalea y [place]. Merengues y gelatinas. Caramelos de [employer], como caramelos duros, caramelos de goma, pastillas de goma, mentas, malvaviscos y pequeas cantidades de chocolate amargo. Torta ngel. Coma todos los dulces y postres con moderacin. Grasas y Naval architect no hidrogenadas (sin grasas trans). Aceites vegetales, incluido el de soja, ssamo, girasol, [place], man, crtamo, maz, canola y semillas de algodn. Alios para ensalada o mayonesa elaborados con aceite vegetal. [place] grasas y los aceites agregados que [place] para Audiological scientist, Teacher, music, preparar ensaladas y las cremas untables. Otros [place] en polvo. T o caf. Todos los alios y condimentos. Los artculos mencionados arriba pueden no ser [employer] de las bebidas o los alimentos recomendados. Comunquese con el  nutricionista para conocer ms opciones. QU ALIMENTOS NO SE RECOMIENDAN? Cereales Panes elaborados con grasas saturadas o trans, aceites o Mattel. Croissants. Panecillos de mantequilla. Panes de queso. Panecillos dulces. Rosquillas. Palomitas de maz con mantequilla. Fideos chow mein. Galletitas con [employer] de grasas, como las que contienen queso o [place]. Carnes y otras fuentes de protenas Carnes grasas, como perros calientes, [place] de res, salchichas, puntas de [place], [place], asado de [place] o [place], y carnero. Fiambres altos en grasas, como salame y [place]. Caviar. Pato y ganso domsticos. Vsceras, como riones, hgado, [place], sesos, [place] de ave, chinchulines y Training and development officer. Lcteos Crema, crema agria, queso crema y [place] cottage con crema. Quesos elaborados con Mattel, incluido el queso [place] (bleu), [place], [place], [place], [place], [place], suizo, cheddar, camembert y [place]. Leche entera o al 2% que sea [place], evaporada o condensada. Suero de [name]. Salsa de crema o queso alta en grasas. Yogur elaborado con Mattel. Bebidas Refrescos regulares y bebidas con agregado de azcar. Dulces y [employer]. Pudin. Galletas. Tortas que no sean la torta ngel. Caramelos que contengan chocolate con leche o chocolate blanco, grasa hidrogenada, mantequilla, coco o ingredientes desconocidos. Almbares con mantequilla. Helados o bebidas elaboradas con  helado con alto contenido de grasas. Grasas y aceites Salsas que [place], [place] de carne o materia grasa. Manteca de cacao, aceites hidrogenados, aceite de palma, aceite de coco, aceite de palmiste. A menudo, estos se encuentran en los productos horneados, los caramelos, las comidas fritas, las cremas no lcteas y las coberturas batidas. Grasas y materias grasas slidas, incluida la grasa del tocino, el cerdo [place], la [place] de cerdo y Community education officer. Sustitutos de crema no  lctea, como cremas para caf y sustitutos de crema agria. Alios para ensaladas elaborados con aceites desconocidos, queso o crema agria. Los artculos mencionados arriba pueden no ser [employer] de las bebidas y los alimentos que se Higher education careers adviser. Comunquese con el nutricionista para obtener ms informacin.   Esta informacin no tiene Marine scientist el consejo del mdico. Asegrese de hacerle al mdico cualquier pregunta que tenga.   Document Released: 09/08/2009 Document Revised: 12/02/2014 Elsevier Interactive Patient Education 2016 [place] alimentacin cardiosaludable (Heart-Healthy Eating Plan) Muchos factores influyen en la salud cardaca, entre ellos, los hbitos de alimentacin y de ejercicio fsico. El riesgo cardaco (coronario) aumenta con los niveles anormales de grasa (lpidos) en la [place]. La planificacin de las comidas cardiosaludables incluye limitar las grasas poco saludables, aumentar las grasas saludables y Field seismologist otros cambios pequeos en la dieta, por ejemplo, mantener un peso corporal saludable para ayudar a que los niveles de lpidos se mantengan dentro de un rango normal. EN QU CONSISTE EL PLAN?  El mdico le recomienda que:  No consuma ms del __________% de las caloras totales en su ingesta diaria de grasa.  Limite su ingesta de grasas saturadas a menos del ______% de las caloras totales diarias.  Limite la cantidad de colesterol en su dieta a menos de _________mg Darlin Coco. QU TIPOS DE GRASAS DEBO ELEGIR?  Elija grasas saludables con mayor frecuencia. Elija las grasas monoinsaturadas y poliinsaturadas, como el aceite de oliva y canola, las semillas de [place], las nueces, las almendras y las semillas.  Consuma ms grasas omega-3. Las mejores opciones incluyen salmn, caballa, sardinas, atn, aceite de lino y semillas de lino molidas. Propngase comer pescado al [employer] veces por semana.  Limite el consumo de grasas saturadas.  Estas se encuentran principalmente en los productos de origen animal, como las carnes, la [place] y la crema. Las grasas saturadas de origen vegetal incluyen aceite de palma, de palmiste y de coco.  Evite los alimentos con aceites parcialmente hidrogenados. Estos contienen grasas trans. Entre los ejemplos de alimentos con grasas trans se incluyen margarinas en barra, algunas margarinas untables, galletas dulces o saladas y otros productos horneados. [place]?  Lea las etiquetas de los alimentos detenidamente para identificar los que contienen grasas trans o altas cantidades de grasas saturadas.  Llene la mitad del plato con verduras y ensaladas de hojas verdes. Coma 4 o 5porciones de verduras Market researcher. Una porcin de verduras equivale a 1taza de verduras de hoja crudas, taza de verduras en trozos crudas o cocidas, o taza de jugo de verduras.  Llene un cuarto del plato con cereales integrales. Busque la palabra "integral" en Equities trader de la lista de ingredientes.  Llene un cuarto del plato con alimentos con protenas magras.  Coma 4 o 5porciones de frutas por da. Una porcin de frutas equivale a una fruta mediana entera; taza de fruta disecada; taza de frutas frescas, congeladas o enlatadas, o taza de jugo 100% de fruta.  Consuma ms alimentos  con fibra soluble, por ejemplo, manzanas, brcoli, zanahorias, frijoles, guisantes y [place]. Trate de consumir de 20a 30g de [employer].  Consuma ms comida casera y menos de restaurante, de buf y comida rpida.  Limite o evite el alcohol.  Limite los alimentos con alto contenido de almidn y Location manager.  Evite las comidas fritas.  Cocine los alimentos utilizando mtodos que no sean la fritura. Las opciones de coccin ms [place] son Teacher, music, Adult nurse, Interior and spatial designer y asar a Administrator, arts. Otras sugerencias para reducir las grasas incluyen lo siguiente:  Quite la piel de las aves.  Quite todas las grasas  visibles de las carnes.  Espume la grasa de los guisos, las sopas y las salsas antes de servirlos.  Cocine al vapor las verduras en agua o caldo.  Baje de peso si es necesario. Perder solo del 5 al 10% de su peso inicial puede ayudarle a mejorar su estado de salud general y a Producer, television/film/video, como la diabetes y las enfermedades cardacas.  Aumente el consumo de frutos secos, legumbres y semillas a 4 o 5porciones por semana. Una porcin de frijoles o legumbres secos equivale a taza despus de su coccin, una porcin de frutos secos equivale a 1onzas y [place] porcin de semillas equivale a onza o 1cucharada.  Es posible que deba controlar la ingesta de sal (sodio), especialmente si tiene hipertensin arterial. Hable con el mdico o el nutricionista para obtener ms informacin sobre cmo Designer, jewellery. QU ALIMENTOS PUEDO COMER? Cereales Panes, incluido el pan francs, blanco, pita, de [place], de pasas de [place], de centeno, de avena e [place]. Tortillas que no estn fritas ni elaboradas con manteca de cerdo ni grasas trans. Panecillos bajos en grasas, incluidos los panes para perros calientes y [place], y los bollitos tipo ingls. Galletas. Muffins. Waffles. Panqueques. Palomitas de maz con bajo contenido calrico. Cereales integrales. Pan sin levadura. Tostada Melba. Pretzels. Palitos de pan. Galletas. Galletitas y TEFL teacher en grasas, entre ellas, las que tienen forma de [place], las [place], el pan cimo, las [place], las que tienen forma de [place] y las de centeno. Arroz y pastas, incluido el arroz integral y las pastas elaboradas con cereales integrales. [place] verduras. Frutas Todas las frutas, pero limite el [place]. [place] y [place] fuentes de protenas Carne de res, [place], cerdo y cordero magras sin grasa. Pollo y pavo sin piel. Todos los pescados y [employer]. Pato salvaje, conejo, faisn y venado. Claras de huevo o sustitutos del huevo bajos en  colesterol. Porotos, guisantes, lentejas secos y tofu.Semillas y la mayora de los frutos secos. Lcteos Quesos descremados y semidescremados, entre ellos, ricota, queso en hebras y Artist. Leche descremada o al 1% que sea lquida, en polvo o evaporada. [place]. Yogur descremado o bajo en grasas. Bebidas Agua mineral. Bebidas gaseosas dietticas. Dulces y postres Sorbetes y helados de fruta. [place], [place], [place], jalea y [place]. Merengues y gelatinas. Caramelos de [employer], como caramelos duros, caramelos de goma, pastillas de goma, mentas, malvaviscos y pequeas cantidades de chocolate amargo. Torta ngel. Coma todos los dulces y postres con moderacin. Grasas y Naval architect no hidrogenadas (sin grasas trans). Aceites vegetales, incluido el de soja, ssamo, girasol, [place], man, crtamo, maz, canola y semillas de algodn. Alios para ensalada o mayonesa elaborados con aceite vegetal. [place] grasas y los aceites agregados que [place] para Audiological scientist, Teacher, music, preparar ensaladas y las cremas untables. Otros [place] en polvo. T o caf. [employer]IG[employer] y  condimentos. Los artculos mencionados arriba pueden no ser [employer] de las bebidas o los alimentos recomendados. Comunquese con el nutricionista para conocer ms opciones. QU ALIMENTOS NO SE RECOMIENDAN? Cereales Panes elaborados con grasas saturadas o trans, aceites o Mattel. Croissants. Panecillos de mantequilla. Panes de queso. Panecillos dulces. Rosquillas. Palomitas de maz con mantequilla. Fideos chow mein. Galletitas con [employer] de grasas, como las que contienen queso o [place]. Carnes y otras fuentes de protenas Carnes grasas, como perros calientes, [place] de res, salchichas, puntas de [place], [place], asado de [place] o [place], y carnero. Fiambres altos en grasas, como salame y [place]. Caviar. Pato y ganso domsticos. Vsceras, como  riones, hgado, [place], sesos, [place] de ave, chinchulines y Training and development officer. Lcteos Crema, crema agria, queso crema y [place] cottage con crema. Quesos elaborados con Mattel, incluido el queso [place] (bleu), [place], [place], [place], [place], [place], suizo, cheddar, camembert y [place]. Leche entera o al 2% que sea [place], evaporada o condensada. Suero de [name]. Salsa de crema o queso alta en grasas. Yogur elaborado con Mattel. Bebidas Refrescos regulares y bebidas con agregado de azcar. Dulces y [employer]. Pudin. Galletas. Tortas que no sean la torta ngel. Caramelos que contengan chocolate con leche o chocolate blanco, grasa hidrogenada, mantequilla, coco o ingredientes desconocidos. Almbares con mantequilla. Helados o bebidas elaboradas con helado con alto contenido de grasas. Grasas y aceites Salsas que [place], [place] de carne o materia grasa. Manteca de cacao, aceites hidrogenados, aceite de palma, aceite de coco, aceite de palmiste. A menudo, estos se encuentran en los productos horneados, los caramelos, las comidas fritas, las cremas no lcteas y las coberturas batidas. Grasas y materias grasas slidas, incluida la grasa del tocino, el cerdo [place], la [place] de cerdo y Community education officer. Sustitutos de crema no lctea, como cremas para caf y sustitutos de crema agria. Alios para ensaladas elaborados con aceites desconocidos, queso o crema agria. Los artculos mencionados arriba pueden no ser [employer] de las bebidas y los alimentos que se Higher education careers adviser. Comunquese con el nutricionista para obtener ms informacin.   Esta informacin no tiene Marine scientist el consejo del mdico. Asegrese de hacerle al mdico cualquier pregunta que tenga.   Document Released: 09/08/2009 Document Revised: 12/02/2014 Elsevier Interactive Patient Education [employer].

## 2015-12-11 NOTE — Progress Notes (Signed)
Patient here to Establish care.  Patient complains of intermittent pain in the right side of her neck which radiates to the shoulder. Patient states her shoulder becomes hot to the touch and has a burning sensation. Pain is scaled at a 7.  Patient takes medications at night.

## 2015-12-12 NOTE — Progress Notes (Signed)
Patient ID: Carla [name], female   DOB: 1971-05-03, 45 y.o.   MRN: [id number]   Carla [name], is a 45 y.o. female  [id number]  [id number]  DOB - [birth year]  CC:  Chief Complaint  Patient presents with  . Establish Care       HPI: Carla [name] is a 45 y.o. female here to establish care. She was hospitalized from 10-10-10/19 2016 with a cerebral infarction due to unknown mechanism. She is being followed for this by Dr. Pearlean Brownie, neurologist. She was referred her to establish care with a PCP. Since that time she complains of pain in her right neck and arm. She was seen in ED or urgent care on 12/26, 12/28, and 01/01 for this neck and arm pain and was prescribed Flexeril of muscle spasms of the neck.She continues to have the neck and arm pain. She was on no medications prior to her CVA. Is currently on ASA and pravastatin for secondard prevention. She has been prescribed prn Flexeril for her neck spasms. She denies tobacco, alcohol or illicit drug use. She request information regarding  A low cholesterol diet. Her health maintenance needs were reviewed and will be addressed at a later visit. She has had her flu shot for the year.   Allergies  Allergen Reactions  . Tramadol Nausea Only   Past Medical History  Diagnosis Date  . Headache   . Stroke [facility])     09/2015   Current Outpatient Prescriptions on File Prior to Visit  Medication Sig Dispense Refill  . cyclobenzaprine (FLEXERIL) 10 MG tablet Take 1 tablet (10 mg total) by mouth 2 (two) times daily as needed for muscle spasms. 20 tablet 0  . [DISCONTINUED] atorvastatin (LIPITOR) 40 MG tablet Take 1 tablet (40 mg total) by mouth daily at 6 PM. 30 tablet 2   No current facility-administered medications on file prior to visit.   Family History  Problem Relation Age of Onset  . Asthma Neg Hx   . Cancer Neg Hx   . Diabetes Neg Hx   . Hyperlipidemia Neg Hx   . Heart failure Neg Hx   . Hypertension Neg Hx   . Migraines Neg Hx   .  Migraines Neg Hx   . Rashes / Skin problems Neg Hx   . Seizures Neg Hx   . Stroke Neg Hx   . Thyroid disease Neg Hx    Social History   Social History  . Marital Status: Single    Spouse Name: N/A  . Number of Children: N/A  . Years of Education: N/A   Occupational History  . Not on file.   Social History Main Topics  . Smoking status: Never Smoker   . Smokeless tobacco: Never Used  . Alcohol Use: No  . Drug Use: No  . Sexual Activity: Yes    Birth Control/ Protection: None   Other Topics Concern  . Not on file   Social History Narrative    Review of Systems: Constitutional: Negative for fever, chills, appetite change, weight loss,  Fatigue. Skin: Negative for rashes or lesions of concern. HENT: Negative for ear pain, ear discharge.nose bleeds Eyes: Negative for pain, discharge, redness, itching and visual disturbance. Neck: Negative for pain, stiffness Respiratory: Negative for cough, shortness of breath,   Cardiovascular: Negative for chest pain, palpitations and leg swelling. Gastrointestinal: Negative for abdominal pain, nausea, vomiting, diarrhea, constipations Genitourinary: Negative for dysuria, urgency, frequency, hematuria,  Musculoskeletal: Negative for back pain, joint pain, joint  swelling,  and gait problem.Negative for weakness.Positive for right neck and arm discomfort Neurological: Negative for tremors, seizures, syncope,   light-headedness, numbness and headaches. Positive for occassional dizziness Hematological: Negative for easy bruising or bleeding Psychiatric/Behavioral: Negative for depression, anxiety, decreased concentration, confusion   Objective:   Filed Vitals:   12/11/15 1447  BP: 120/85  Pulse: 93  Temp: 97.8 F (36.6 C)  Resp: 18    Physical Exam: Constitutional: Patient appears well-developed and well-nourished. No distress. HENT: Normocephalic, atraumatic, External right and left ear normal. Oropharynx is clear and moist.   Eyes: Conjunctivae and EOM are normal. PERRLA, no scleral icterus. Neck: Normal ROM. Neck supple. No lymphadenopathy, No thyromegaly. CVS: RRR, S1/S2 +, no murmurs, no gallops, no rubs Pulmonary: Effort and breath sounds normal, no stridor, rhonchi, wheezes, rales.  Abdominal: Soft. Normoactive BS,, no distension, tenderness, rebound or guarding.  Musculoskeletal: Normal range of motion. No edema and no tenderness.  Neuro: Alert.Normal muscle tone coordination. Non-focal Skin: Skin is warm and dry. No rash noted. Not diaphoretic. No erythema. No pallor. Psychiatric: Normal mood and affect. Behavior, judgment, thought content normal.  Lab Results  Component Value Date   WBC 9.5 11/20/2015   HGB 13.3 11/20/2015   HCT 40.0 11/20/2015   MCV 90.5 11/20/2015   PLT 299 11/20/2015   Lab Results  Component Value Date   CREATININE 0.78 11/20/2015   BUN 6 11/20/2015   NA 141 11/20/2015   K 3.9 11/20/2015   CL 109 11/20/2015   CO2 25 11/20/2015    Lab Results  Component Value Date   HGBA1C 5.6 10/12/2015   Lipid Panel     Component Value Date/Time   CHOL 207* 10/12/2015 0638   TRIG 89 10/12/2015 0638   HDL 50 10/12/2015 0638   CHOLHDL 4.1 10/12/2015 0638   VLDL 18 10/12/2015 0638   LDLCALC 139* 10/12/2015 0638       Assessment and plan:   1. Encounter to establish care -I have reviewed information presented by the patient with the help of an interpreter and have reviewed pertinent information from her chart.   2. Cerebrovascular accident (CVA), unspecified mechanism (HCC) -Follow-up with Dr. Pearlean Brownie as planned -Continue medications as prescribed by him. -I have provided information in Spanish as to low fat, low cholesterol diet.   3. Strain neck -Discussed in detail a good sleeping position for neck strain -Have provided neck exercise handout    Return if symptoms worsen or fail to improve.  The patient was given clear instructions to go to ER or return to medical  center if symptoms don't improve, worsen or new problems develop. The patient verbalized understanding.    Henrietta Hoover FNP  12/12/2015, 9:54 AM

## 2015-12-20 ENCOUNTER — Encounter (HOSPITAL_COMMUNITY): Payer: Self-pay | Admitting: *Deleted

## 2015-12-20 ENCOUNTER — Emergency Department (HOSPITAL_COMMUNITY)
Admission: EM | Admit: 2015-12-20 | Discharge: 2015-12-20 | Disposition: A | Discharge status: Home / Self Care | Payer: Medicaid Other | Source: Home / Self Care | Attending: Family Medicine | Admitting: Family Medicine

## 2015-12-20 DIAGNOSIS — S39012D Strain of muscle, fascia and tendon of lower back, subsequent encounter: Secondary | ICD-10-CM | POA: Diagnosis not present

## 2015-12-20 MED ORDER — METAXALONE 800 MG PO TABS: 800.0000 mg | ORAL_TABLET | Freq: Three times a day (TID) | ORAL | Status: DC

## 2015-12-20 NOTE — ED Notes (Signed)
Pt  Reports  Upper back  Pain       For  Several    Days       With  Slight  Headache   As  Well            denys  Any  Injury    Ambulated  With  Steady  Fluid  Gait

## 2015-12-20 NOTE — ED Provider Notes (Signed)
CSN: [id number]     Arrival date & time 12/20/15  1300 History   First MD Initiated Contact with Patient 12/20/15 1310     Chief Complaint  Patient presents with  . Back Pain   (Consider location/radiation/quality/duration/timing/severity/associated sxs/prior Treatment) Patient is a 45 y.o. female presenting with back pain. The history is provided by the patient.  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 Headache   . Stroke [facility])     09/2015   Past Surgical History  Procedure Laterality Date  . Tee without cardioversion N/A 10/13/2015    Procedure: TRANSESOPHAGEAL ECHOCARDIOGRAM (TEE);  Surgeon: Lelon Perla, MD;  Location: [facility] ENDOSCOPY;  Service: Cardiovascular;  Laterality: N/A;  . Right arm surgery     Family History  Problem Relation Age of Onset  . Asthma Neg Hx   . Cancer Neg Hx   . Diabetes Neg Hx   . Hyperlipidemia Neg Hx   . Heart failure Neg Hx   . Hypertension Neg Hx   . Migraines Neg Hx   . Migraines Neg Hx   . Rashes / Skin problems Neg Hx   . Seizures Neg Hx   . Stroke Neg Hx   . Thyroid disease Neg Hx    Social History  Substance Use Topics  . Smoking status: Never Smoker   . Smokeless tobacco: Never Used  . Alcohol Use: No   OB History    No data available     Review of Systems  Constitutional: Negative.  Negative for fever.  Respiratory: Negative.   Cardiovascular: Negative.   Gastrointestinal: Negative.  Negative for abdominal pain.  Genitourinary: Negative.  Negative for dysuria.  Musculoskeletal: Positive for back pain. Negative for joint swelling and gait problem.  Skin: Negative.    All other systems reviewed and are negative.   Allergies  Tramadol  Home Medications   Prior to Admission medications   Medication Sig Start Date End Date Taking? Authorizing Provider  aspirin 325 MG tablet Take 1 tablet (325 mg total) by mouth daily. 12/11/15   Micheline Chapman, NP  cyclobenzaprine (FLEXERIL) 10 MG tablet Take 1 tablet (10 mg total) by mouth 2 (two) times daily as needed for muscle spasms. 11/26/15   Konrad Felix, PA  pravastatin (PRAVACHOL) 40 MG tablet Take 1 tablet (40 mg total) by mouth daily. 12/11/15   Micheline Chapman, NP   Meds Ordered and Administered this Visit  Medications - No data to display  BP 102/73 mmHg  Pulse 105  Temp(Src) 98.3 F (36.8 C) (Oral)  Resp 16  SpO2 98%  LMP 11/20/2015 (Approximate) No data found.   Physical Exam  Constitutional: She is oriented to person, place, and time. She appears well-developed and well-nourished. No distress.  Abdominal: Soft. Bowel sounds are normal.  Musculoskeletal: She exhibits tenderness.       Lumbar back: She exhibits tenderness, pain and spasm. She exhibits normal range of motion, no swelling, no edema and normal pulse.  Neurological: She is alert and oriented to person, place, and time.  Skin: Skin is warm and dry.  Nursing note and vitals reviewed.  ED Course  Procedures (including critical care time)  Labs Review Labs Reviewed - No data to display  Imaging Review No results found.   Visual Acuity Review  Right Eye Distance:   Left Eye Distance:   Bilateral Distance:    Right Eye Near:   Left Eye Near:    Bilateral Near:         MDM  No diagnosis found. Meds ordered this encounter  Medications  . metaxalone (SKELAXIN) 800 MG tablet    Sig: Take 1 tablet (800 mg total) by mouth 3 (three) times daily. For back pain    Dispense:  30 tablet    Refill:  0       Billy Fischer, MD 12/20/15 1345

## 2015-12-26 ENCOUNTER — Telehealth: Payer: Self-pay | Admitting: Neurology

## 2015-12-26 NOTE — Telephone Encounter (Signed)
Rn call patients sister Theodoro Clock about her sisters medication. RN stated Dr.Sethi cannot give advice on tylenol and amoxicillin because he did not prescribed it. Rn stated is new patient and has an appt this week with Dr.Sethi for follow up. Rn stated to Tamar(pts sister ) that pt needs to call the Md office that prescribed the amoxicillin and tylenol. Pts sister will let her sister know that.

## 2015-12-26 NOTE — Telephone Encounter (Signed)
Pt called and state that she was given Amoxicillin 500mg  and Acetominophen. She wants to know if its ok that she takes these medications. Please call and advise [phone]

## 2015-12-26 NOTE — Telephone Encounter (Signed)
Unable to leave vm, pt phone not valid.

## 2015-12-28 ENCOUNTER — Telehealth (HOSPITAL_COMMUNITY): Payer: Self-pay | Admitting: Emergency Medicine

## 2015-12-28 ENCOUNTER — Encounter: Payer: Self-pay | Admitting: Neurology

## 2015-12-28 ENCOUNTER — Ambulatory Visit (INDEPENDENT_AMBULATORY_CARE_PROVIDER_SITE_OTHER): Payer: Medicaid Other | Admitting: Neurology

## 2015-12-28 VITALS — BP 117/81 | HR 87 | Ht 63.0 in | Wt 126.2 lb

## 2015-12-28 DIAGNOSIS — I639 Cerebral infarction, unspecified: Secondary | ICD-10-CM | POA: Diagnosis not present

## 2015-12-28 NOTE — Patient Instructions (Signed)
I had a long d/w patient  Via spanish language interpreter about her recent stroke, risk for recurrent stroke/TIAs, personally independently reviewed imaging studies and stroke evaluation results and answered questions.Continue aspirin 325 mg daily  for secondary stroke prevention and maintain strict control of hypertension with blood pressure goal below 130/90,   and lipids with LDL cholesterol goal below 70 mg/dL. I also advised the patient to eat a healthy diet with plenty of whole grains, cereals, fruits and vegetables, exercise regularly and maintain ideal body weight. Check transcranial Doppler bubble study for a small PFO not visualized on the TEE. Followup in the future with Cecille Rubin, nurse practitioner in 6 months or call earlier if necessary.  Stroke Prevention Some medical conditions and behaviors are associated with an increased chance of having a stroke. You may prevent a stroke by making healthy choices and managing medical conditions. HOW CAN I REDUCE MY RISK OF HAVING A STROKE?   Stay physically active. Get at least 30 minutes of activity on most or all days.  Do not smoke. It may also be helpful to avoid exposure to secondhand smoke.  Limit alcohol use. Moderate alcohol use is considered to be:  No more than 2 drinks per day for men.  No more than 1 drink per day for nonpregnant women.  Eat healthy foods. This involves:  Eating 5 or more servings of fruits and vegetables a day.  Making dietary changes that address high blood pressure (hypertension), high cholesterol, diabetes, or obesity.  Manage your cholesterol levels.  Making food choices that are high in fiber and low in saturated fat, trans fat, and cholesterol may control cholesterol levels.  Take any prescribed medicines to control cholesterol as directed by your health care provider.  Manage your diabetes.  Controlling your carbohydrate and sugar intake is recommended to manage diabetes.  Take any  prescribed medicines to control diabetes as directed by your health care provider.  Control your hypertension.  Making food choices that are low in salt (sodium), saturated fat, trans fat, and cholesterol is recommended to manage hypertension.  Ask your health care provider if you need treatment to lower your blood pressure. Take any prescribed medicines to control hypertension as directed by your health care provider.  If you are 12-42 years of age, have your blood pressure checked every 3-5 years. If you are 14 years of age or older, have your blood pressure checked every year.  Maintain a healthy weight.  Reducing calorie intake and making food choices that are low in sodium, saturated fat, trans fat, and cholesterol are recommended to manage weight.  Stop drug abuse.  Avoid taking birth control pills.  Talk to your health care provider about the risks of taking birth control pills if you are over 47 years old, smoke, get migraines, or have ever had a blood clot.  Get evaluated for sleep disorders (sleep apnea).  Talk to your health care provider about getting a sleep evaluation if you snore a lot or have excessive sleepiness.  Take medicines only as directed by your health care provider.  For some people, aspirin or blood thinners (anticoagulants) are helpful in reducing the risk of forming abnormal blood clots that can lead to stroke. If you have the irregular heart rhythm of atrial fibrillation, you should be on a blood thinner unless there is a good reason you cannot take them.  Understand all your medicine instructions.  Make sure that other conditions (such as anemia or atherosclerosis) are addressed.  SEEK IMMEDIATE MEDICAL CARE IF:   You have sudden weakness or numbness of the face, arm, or leg, especially on one side of the body.  Your face or eyelid droops to one side.  You have sudden confusion.  You have trouble speaking (aphasia) or understanding.  You have  sudden trouble seeing in one or both eyes.  You have sudden trouble walking.  You have dizziness.  You have a loss of balance or coordination.  You have a sudden, severe headache with no known cause.  You have new chest pain or an irregular heartbeat. Any of these symptoms may represent a serious problem that is an emergency. Do not wait to see if the symptoms will go away. Get medical help at once. Call your local emergency services (911 in [place] Do not drive yourself to the hospital.   This information is not intended to replace advice given to you by your health care provider. Make sure you discuss any questions you have with your health care provider.   Document Released: 12/19/2004 Document Revised: 12/02/2014 Document Reviewed: 05/14/2013 Elsevier Interactive Patient Education 2016 [place] en grasas y colesterol (Fat and Cholesterol Restricted Diet) El exceso de grasas y colesterol en la dieta puede causar problemas de [place]. Esta dieta lo ayudar a Theatre manager las grasas y Freight forwarder en los niveles normales para evitar enfermarse. QU TIPOS DE GRASAS DEBO ELEGIR?  Elija grasas monosaturadas y polinsaturadas. Estas se encuentran en alimentos como el aceite de oliva, aceite de canola, semillas de lino, nueces, almendras y semillas.  Consuma ms grasas omega-3. Las mejores opciones incluyen salmn, caballa, sardinas, atn, aceite de lino y semillas de lino molidas.  Limite el consumo de grasas saturadas, que se encuentran en productos de origen animal, como carnes, mantequilla y crema. Tambin pueden estar en productos vegetales, como aceite de palma, de palmiste y de coco.   Evite los alimentos con aceites parcialmente hidrogenados. Estos contienen grasas trans. Entre los ejemplos de alimentos con grasas trans se incluyen margarinas en barra, algunas margarinas untables, galletas dulces o saladas y otros productos horneados. [place]?   Lea las etiquetas de los alimentos. Busque las palabras "grasas trans" y "grasas saturadas".  Al preparar una comida:  Llene la mitad del plato con verduras y ensaladas de hojas verdes.  Llene un cuarto del plato con cereales integrales. Busque la palabra "integral" en Equities trader de la lista de ingredientes.  Llene un cuarto del plato con alimentos con protenas magras.  [place] a dos porciones por da. Elija frutas en lugar de jugos.  Coma ms alimentos con fibra soluble, por ejemplo, manzanas, brcoli, zanahorias, frijoles, guisantes y [place]. Trate de consumir de 20a 30g (gramos) de Conservator, museum/gallery.  Coma ms comidas caseras. Coma menos en los restaurantes y los bares.  Limite o evite el alcohol.  Limite los alimentos con alto contenido de almidn y Location manager.  Limite el consumo de alimentos fritos.  Cocine los alimentos sin frerlos. Las opciones de coccin ms [place] son Teacher, music, Adult nurse, Interior and spatial designer y asar a Administrator, arts.  Baje de peso si es necesario. Aunque pierda Automatic Data, esto puede ser importante para la salud general. Tambin puede ayudar a prevenir enfermedades como diabetes y enfermedad cardaca. QU ALIMENTOS PUEDO COMER? Cereales Cereales integrales, como los panes de salvado o [place], las [place], los cereales y las pastas. Avena sin endulzar, trigo, [place], Delfin Gant arroz integral. Tortillas de harina de maz o  de salvado. Verduras Verduras frescas o congeladas (crudas, al vapor, asadas o grilladas). Ensaladas de hojas verdes. Lambert Mody Lambert Mody frescas, en conserva (en su jugo natural) o frutas congeladas. Carnes y otros productos con protenas Carne de res molida (al 85% o ms [place]), carne de res de animales alimentados con pastos o carne de res sin la grasa. Pollo o pavo sin piel. Carne de pollo o de [place]. Cerdo sin la grasa. Todos los pescados y frutos de mar. Huevos. Porotos, guisantes o lentejas secos. Frutos secos o semillas  sin sal. Frijoles secos o en lata sin sal. Lcteos Productos lcteos con bajo contenido de grasas, como [place] o al 1%, quesos reducidos en grasas o al 2%, ricota con bajo contenido de grasas o [employer], o yogur natural con bajo contenido de [place]. Grasas y [employer] untables que no contengan grasas trans. [place] y condimentos para ensaladas livianos o reducidos en grasas. [place]. Aceites de oliva, canola, ssamo o crtamo. Mantequilla natural de cacahuate o almendra (elija la que no tenga agregado de aceite o azcar). Los artculos mencionados arriba pueden no ser [employer] de las bebidas o los alimentos recomendados. Comunquese con el nutricionista para conocer ms opciones. QU ALIMENTOS NO SE RECOMIENDAN? Cereales Pan blanco. Pastas blancas. Arroz blanco. Pan de maz. Bagels, pasteles y croissants. Galletas saladas que contengan grasas trans. Verduras Papas blancas. [place] con crema o fritas. Verduras en [place]. Lambert Mody Frutas secas. Fruta enlatada en almbar liviano o espeso. Jugo de frutas. Carnes y otros productos con protenas [place] de carne con Lobbyist. Costillas, alas de pollo, tocineta, salchicha, mortadela, salame, chinchulines, tocino, perros calientes, salchichas alemanas y embutidos envasados. Hgado y otros rganos. Lcteos Leche entera o al 2%, crema, mezcla de [place] y crema, y queso crema. Quesos enteros. Yogur entero o endulzado. Quesos con toda su grasa. [place]remas no lcteas y coberturas batidas. Quesos procesados, quesos para untar o cuajadas. Dulces y postres Jarabe de maz, azcares, miel y Control and instrumentation engineer. Caramelos. Mermelada y [place]. Chrissie Noa. Cereales endulzados. Galletas, pasteles, bizcochuelos, donas, muffins y helado. Grasas y aceites [place], [place] en barra, Ivor de [place], [place], [place] clarificada o grasa de tocino. Aceites de coco, de palmiste o de palma. Bebidas Alcohol. Bebidas endulzadas (como  refrescos, limonadas y bebidas frutales o ponches). Los artculos mencionados arriba pueden no ser [employer] de las bebidas y los alimentos que se Higher education careers adviser. Comunquese con el nutricionista para obtener ms informacin.   Esta informacin no tiene Marine scientist el consejo del mdico. Asegrese de hacerle al mdico cualquier pregunta que tenga.   Document Released: 05/12/2012 Document Revised: 12/02/2014 Elsevier Interactive Patient Education [employer].

## 2015-12-28 NOTE — ED Notes (Signed)
Carla [name] from [place] [facility] [address] called and reports [facility] will not cover Skelaxin He suggested Tizanidine 4mg  TID.... Pr Dr Juventino Slovak.... Ok to dispense #30 w/no refills

## 2015-12-28 NOTE — Progress Notes (Signed)
[name] [address] [place]. [place] [address] [phone]       OFFICE FOLLOW-UP NOTE  Carla [name] Date of Birth:  09-04-1971 Medical Record Number:  [id number]   HPI: 18 year Hispanic lady who is from [place] is seen today for the first office follow-up visit for hospital admission for stroke in November 2016. As patient speaks limited [place] and hence a Spanish language interpreter was present throughout this visit. Carla [name] is an 45 y.o. female hx of headaches presenting to ED with headache, speech deficits and right sided weakness. Patient reports having severe headache 8 days ago associated with emesis. The headache resolved but she noted speech deficits and right sided weakness and paresthesias since then. Went to her PCP who sent her to the ED. Denies any prior CVA or TIAs. Does note history of blurred vision in the past. No history of MS. No history of heart palpitations. No family history of blood clotting. MRI brain imaging reviewed, shows possible multiple subacute left MCA infarcts. Her last known well was unclear. Modified Rankin: Rankin Score=0. Patient was not administered TPA secondary to unclear LSW, symptoms resolved. She was admitted for further evaluation and treatment. MRI scan of the brain showed traced diffusion signal in the posterior left MCA territory consistent with MCA branch infarct. MRA of the brain showed slight diminished peripheral branches in the left MCA but no large vessel stenosis or occlusion. Carotid ultrasound showed no significant extracranial stenosis. Transthoracic echo showed normal ejection fraction. Transesophageal echocardiogram showed no cardiac source of embolism or PFO. Antiphospholipid antibodies and lupus anticoagulant were both negative. Lipid profile showed elevated total cholesterol 207 and LDL 139 mg percent. Hemoglobin A1c was 5.6. Patient was started on aspirin as well as protocol. She states her speech  difficulty and right-sided weakness completely improved. She still has some intermittent headaches which are mild. Headaches do not last long and she does not specifically need to take any medications for the headaches. She did have follow-up lipid profile checked 3 weeks ago by her primary physician but results are not known. She is tolerating Pravachol well without side effects and aspirin without bleeding or bruising. She is return back to work without any restrictions but she does feel that she tires more easily and has to take frequent short breaks during a workup. ROS:   14 system review of systems is positive for  weight loss, blurred vision, feeling cold, increased thirst, allergies, insomnia, headache and all other systems negative PMH:  Past Medical History  Diagnosis Date  . Stroke [facility])     09/2015    Social History:  Social History   Social History  . Marital Status: Single    Spouse Name: N/A  . Number of Children: N/A  . Years of Education: N/A   Occupational History  . Not on file.   Social History Main Topics  . Smoking status: Never Smoker   . Smokeless tobacco: Never Used  . Alcohol Use: No  . Drug Use: No  . Sexual Activity: Yes    Birth Control/ Protection: None   Other Topics Concern  . Not on file   Social History Narrative    Medications:   Current Outpatient Prescriptions on File Prior to Visit  Medication Sig Dispense Refill  . aspirin 325 MG tablet Take 1 tablet (325 mg total) by mouth daily. 30 tablet 2  . cyclobenzaprine (FLEXERIL) 10 MG tablet Take 1 tablet (10 mg total) by mouth 2 (  two) times daily as needed for muscle spasms. 20 tablet 0  . metaxalone (SKELAXIN) 800 MG tablet Take 1 tablet (800 mg total) by mouth 3 (three) times daily. For back pain 30 tablet 0  . pravastatin (PRAVACHOL) 40 MG tablet Take 1 tablet (40 mg total) by mouth daily. 30 tablet 2  . [DISCONTINUED] atorvastatin (LIPITOR) 40 MG tablet Take 1 tablet (40 mg total) by  mouth daily at 6 PM. 30 tablet 2   No current facility-administered medications on file prior to visit.    Allergies:   Allergies  Allergen Reactions  . Tramadol Nausea Only    Physical Exam General: well developed, well nourished, seated, in no evident distress Head: head normocephalic and atraumatic.  Neck: supple with no carotid or supraclavicular bruits Cardiovascular: regular rate and rhythm, no murmurs Musculoskeletal: no deformity Skin:  no rash/petichiae Vascular:  Normal pulses all extremities Filed Vitals:   12/28/15 0937  BP: 117/81  Pulse: 87   Neurologic Exam Mental Status: Awake and fully alert. Oriented to place and time. Recent and remote memory intact. Attention span, concentration and fund of knowledge appropriate. Mood and affect appropriate.  Cranial Nerves: Fundoscopic exam reveals sharp disc margins. Pupils equal, briskly reactive to light. Extraocular movements full without nystagmus. Visual fields full to confrontation. Hearing intact. Facial sensation intact. Face, tongue, palate moves normally and symmetrically.  Motor: Normal bulk and tone. Normal strength in all tested extremity muscles. Sensory.: intact to touch ,pinprick .position and vibratory sensation.  Coordination: Rapid alternating movements normal in all extremities. Finger-to-nose and heel-to-shin performed accurately bilaterally. Gait and Station: Arises from chair without difficulty. Stance is normal. Gait demonstrates normal stride length and balance . Able to heel, toe and tandem walk without difficulty.  Reflexes: 1+ and symmetric. Toes downgoing.   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    PLAN: I had a long d/w patient  via spanish language interpreter about her recent stroke, risk for recurrent stroke/TIAs, personally  independently reviewed imaging studies and stroke evaluation results and answered questions.Continue aspirin 325 mg daily  for secondary stroke prevention and maintain strict control of hypertension with blood pressure goal below 130/90,   and lipids with LDL cholesterol goal below 70 mg/dL. I also advised the patient to eat a healthy diet with plenty of whole grains, cereals, fruits and vegetables, exercise regularly and maintain ideal body weight. Check transcranial Doppler bubble study for a small PFO not visualized on the TEE. Followup in the future with Darrol Angel, nurse practitioner in 6 months or call earlier if necessary. [name], MD Greater than 50% of time during this 25 minute visit was spent on counseling,explanation of diagnosis, planning of further management, discussion with patient and family and coordination of care  Note: This document was prepared with digital dictation and possible smart phrase technology. Any transcriptional errors that result from this process are unintentional

## 2016-01-02 ENCOUNTER — Telehealth: Payer: Self-pay | Admitting: Neurology

## 2016-01-02 NOTE — Telephone Encounter (Signed)
Called patient to let her know that the office would be calling her in next few weeks to to get her doppler scheduled here at [facility]. I had to leave patient a message on her voice mail.

## 2016-02-06 ENCOUNTER — Encounter (HOSPITAL_COMMUNITY): Payer: Self-pay | Admitting: Emergency Medicine

## 2016-02-06 ENCOUNTER — Emergency Department (HOSPITAL_COMMUNITY)
Admission: EM | Admit: 2016-02-06 | Discharge: 2016-02-06 | Disposition: A | Discharge status: Home / Self Care | Payer: Medicaid Other | Source: Home / Self Care | Attending: Family Medicine | Admitting: Family Medicine

## 2016-02-06 DIAGNOSIS — G44209 Tension-type headache, unspecified, not intractable: Secondary | ICD-10-CM | POA: Diagnosis not present

## 2016-02-06 DIAGNOSIS — J069 Acute upper respiratory infection, unspecified: Secondary | ICD-10-CM

## 2016-02-06 DIAGNOSIS — T148XXA Other injury of unspecified body region, initial encounter: Secondary | ICD-10-CM

## 2016-02-06 DIAGNOSIS — R0982 Postnasal drip: Secondary | ICD-10-CM | POA: Diagnosis not present

## 2016-02-06 DIAGNOSIS — T148 Other injury of unspecified body region: Secondary | ICD-10-CM | POA: Diagnosis not present

## 2016-02-06 DIAGNOSIS — R110 Nausea: Secondary | ICD-10-CM

## 2016-02-06 MED ORDER — DICLOFENAC SODIUM 1 % TD GEL: TRANSDERMAL | Status: DC

## 2016-02-06 MED ORDER — ONDANSETRON HCL 4 MG PO TABS: 4.0000 mg | ORAL_TABLET | Freq: Three times a day (TID) | ORAL | Status: DC | PRN

## 2016-02-06 NOTE — ED Provider Notes (Signed)
CSN: [id number]     Arrival date & time 02/06/16  1309 History   First MD Initiated Contact with Patient 02/06/16 1441     Chief Complaint  Patient presents with  . Influenza   (Consider location/radiation/quality/duration/timing/severity/associated sxs/prior Treatment) HPI Comments: Pleasant 45 year old Hispanic female who speaks English states that for the past 3 days she has felt well in general. She is a bit nonspecific in this regard. She has a mild headache it is intermittent. The pain is primarily to the right hemicranium and neck. She states that there is a sensation of hot alternating with cold to the right side of the head and neck.  Denies fever or chills. Denies earache, denies sore throat. She does have an occasional nausea after eating but no vomiting. She endorses the fact that both her husband and son are sick with apparent viruses and fevers. Patient states that she has had a stroke in the past. The apparent residual effects is minor weakness to the right side of the neck. She states sometimes her head will lean to the right. She demonstrates excellent muscle tone, strength and movement of all 4 extremities.   Past Medical History  Diagnosis Date  . Stroke [facility])     09/2015   Past Surgical History  Procedure Laterality Date  . Tee without cardioversion N/A 10/13/2015    Procedure: TRANSESOPHAGEAL ECHOCARDIOGRAM (TEE);  Surgeon: Lelon Perla, MD;  Location: [facility] ENDOSCOPY;  Service: Cardiovascular;  Laterality: N/A;  . Right arm surgery     Family History  Problem Relation Age of Onset  . Asthma Neg Hx   . Cancer Neg Hx   . Diabetes Neg Hx   . Hyperlipidemia Neg Hx   . Heart failure Neg Hx   . Hypertension Neg Hx   . Migraines Neg Hx   . Rashes / Skin problems Neg Hx   . Seizures Neg Hx   . Stroke Neg Hx   . Thyroid disease Neg Hx   . Leukemia Mother    Social History  Substance Use Topics  . Smoking status: Never Smoker   . Smokeless tobacco: Never Used  .  Alcohol Use: No   OB History    No data available     Review of Systems  Constitutional: Negative for fever, chills, activity change and fatigue.  HENT: Negative for congestion, ear pain, rhinorrhea, sore throat and trouble swallowing.   Respiratory: Negative for cough, shortness of breath and wheezing.   Cardiovascular: Negative for chest pain and palpitations.  Gastrointestinal: Positive for nausea. Negative for vomiting and abdominal pain.  Genitourinary: Negative.   Musculoskeletal: Positive for myalgias and neck pain. Negative for neck stiffness.  Skin: Negative.   Neurological: Positive for numbness and headaches. Negative for tremors, syncope, facial asymmetry and speech difficulty.  Psychiatric/Behavioral: Negative.   All other systems reviewed and are negative.   Allergies  Tramadol  Home Medications   Prior to Admission medications   Medication Sig Start Date End Date Taking? Authorizing Provider  aspirin 325 MG tablet Take 1 tablet (325 mg total) by mouth daily. 12/11/15   Micheline Chapman, NP  diclofenac sodium (VOLTAREN) 1 % GEL Applied to the muscles of the neck and shoulder 4 times a day as needed for pain and soreness. 02/06/16   Janne Napoleon, NP  ibuprofen (ADVIL,MOTRIN) 200 MG tablet Take 200 mg by mouth every 6 (six) hours as needed.    Historical Provider, MD  ondansetron (ZOFRAN) 4 MG tablet Take  1 tablet (4 mg total) by mouth every 8 (eight) hours as needed for nausea. May cause constipation 02/06/16   Janne Napoleon, NP  pravastatin (PRAVACHOL) 40 MG tablet Take 1 tablet (40 mg total) by mouth daily. 12/11/15   Micheline Chapman, NP   Meds Ordered and Administered this Visit  Medications - No data to display  BP 134/76 mmHg  Pulse 91  Temp(Src) 98.1 F (36.7 C) (Oral)  Resp 14  SpO2 98% No data found.   Physical Exam  Constitutional: She is oriented to person, place, and time. She appears well-developed and well-nourished. No distress.  HENT:  Head:  Normocephalic and atraumatic.  Bilateral TMs are normal. Oropharynx with minimal erythema, cobblestoning and moderate amount of clear PND. No exudates or swelling.  Eyes: Conjunctivae and EOM are normal.  Neck: Normal range of motion. Neck supple.  There is tenderness to the right paracervical musculature, right splenius capitis and right trapezius ridge. Is also tenderness to the right parietal and occipital muscles of the scalp. No external lesions. No swelling. No cervical spine tenderness, deformity, swelling or discoloration.  Cardiovascular: Normal rate, regular rhythm, normal heart sounds and intact distal pulses.   Pulmonary/Chest: Effort normal and breath sounds normal. No respiratory distress. She has no wheezes. She has no rales.  Abdominal: Soft. There is no tenderness.  Musculoskeletal: Normal range of motion. She exhibits no edema.  Lymphadenopathy:    She has no cervical adenopathy.  Neurological: She is alert and oriented to person, place, and time. No cranial nerve deficit. She exhibits normal muscle tone.  Skin: Skin is warm and dry.  Psychiatric: She has a normal mood and affect.  Nursing note and vitals reviewed.   ED Course  Procedures (including critical care time)  Labs Review Labs Reviewed - No data to display  Imaging Review No results found.   Visual Acuity Review  Right Eye Distance:   Left Eye Distance:   Bilateral Distance:    Right Eye Near:   Left Eye Near:    Bilateral Near:         MDM   1. URI (upper respiratory infection)   2. PND (post-nasal drip)   3. Tension headache   4. Muscle strain   5. Nausea    For drainage and recommend taking other Zyrtec, Claritin or Allegra. If you develop a sore throat may use Cepacol lozenges. Use the Voltaren gel to the right side of your neck 4 times a day as needed for pain Zofran as directed for nausea. Drink plenty of fluids and stay well-hydrated. Meds ordered this encounter  Medications   . ibuprofen (ADVIL,MOTRIN) 200 MG tablet    Sig: Take 200 mg by mouth every 6 (six) hours as needed.  . ondansetron (ZOFRAN) 4 MG tablet    Sig: Take 1 tablet (4 mg total) by mouth every 8 (eight) hours as needed for nausea. May cause constipation    Dispense:  12 tablet    Refill:  0    Order Specific Question:  Supervising Provider    Answer:  Billy Fischer [phone]  . diclofenac sodium (VOLTAREN) 1 % GEL    Sig: Applied to the muscles of the neck and shoulder 4 times a day as needed for pain and soreness.    Dispense:  100 g    Refill:  0    Order Specific Question:  Supervising Provider    Answer:  Ihor Gully D [phone]  Janne Napoleon, NP 02/06/16 [phone]

## 2016-02-06 NOTE — ED Notes (Signed)
Patient reports a 3 day history of headache, abdominal pain, nausea, no vomiting, no appetite.  Denies fever.  Patient reports other family members are sick with similar symptoms.

## 2016-02-06 NOTE — Discharge Instructions (Signed)
Nuseas y vmitos (Nausea and Vomiting)  Naseas significa que tiene ganas de vomitar. Elvmitoes un reflejo por el que los contenidos del estmago salen por la boca.  CUIDADOS EN EL HOGAR  Tome los medicamentos como le indic el mdico.  No se esfuerce por comer. Sin embargo, es necesario que tome lquidos.  Si tiene ganas de comer, [place] dieta normal, segn las indicaciones del mdico.  Coma arroz, trigo, patatas, pan, carnes magras, yogur, frutas y vegetales.  Evite los alimentos [employer].  Beba gran cantidad de lquidos para mantener la orina de tono claro o color amarillo plido.  Consulte a su mdico como reponer la prdida de lquidos (rehidratacin). Los signos de prdida de lquidos (deshidratacin) son:  Catering manager sed.  Tiene los labios y la boca secos.  Est mareado.  La orina es [place].  Orina menos que lo normal.  Se siente confundido.  La respiracin o la frecuencia cardaca son rpidas. SOLICITE AYUDA DE INMEDIATO SI:  Observa sangre en su vmito.  La materia fecal (heces)es negra o de color rojo.  Siente un dolor de cabeza muy intenso o el cuello rgido.  Se siente confundido.  Siente dolor muy fuerte en el vientre (abdominal).  Tiene dolor en el pecho o dificultad para respirar.  No ha orinado durante 8 horas.  Tiene la piel fra y pegajosa.  Sigue vomitando despus de 24  48 horas.  Tiene fiebre. ASEGRESE QUE:  Comprende estas instrucciones.  Controlar la enfermedad.  Puede solicitar ayuda de inmediato si los sntomas no mejoran, o si empeoran.   Esta informacin no tiene Marine scientist el consejo del mdico. Asegrese de hacerle al mdico cualquier pregunta que tenga.   Document Released: 05/01/2010 Document Revised: 02/03/2012 Elsevier Interactive Patient Education 2016 [place] tracto respiratorio superior, adultos (Upper Respiratory Infection, Adult) For drainage and recommend taking  other Zyrtec, Claritin or Allegra. If you develop a sore throat may use Cepacol lozenges. Use the Voltaren gel to the right side of your neck 4 times a day as needed for pain Zofran as directed for nausea. Drink plenty of fluids and stay well-hydrated. La mayora de las infecciones del tracto respiratorio superior estn causadas por un virus. Un infeccin del tracto respiratorio superior afecta la nariz, la garganta y las vas respiratorias superiores. El tipo ms comn de infeccin del tracto respiratorio superior es el resfro comn. [place]gil los medicamentos solamente como se lo haya indicado el mdico.  A fin de Best boy de garganta, haga grgaras con solucin salina templada o consuma caramelos para la tos, como se lo haya indicado el mdico.  Use un humidificador de vapor clido o inhale el vapor de la ducha para aumentar la humedad del aire. Esto facilitar la respiracin.  Beba suficiente lquido para mantener el pis (orina) claro o de color [place]ar[place] plido.  Tome sopas y caldos transparentes.  Siga una dieta saludable.  Descanse todo lo que sea necesario.  Regrese al [place] cuando la fiebre haya desaparecido o el mdico le diga que puede [place].  Es posible que deba quedarse en su casa durante un tiempo prolongado, para no transmitir la infeccin a los dems.  Union usar un barbijo y lavarse las manos con frecuencia para evitar el contagio del virus.  Si tiene asma, use el inhalador con mayor frecuencia.  No consuma ningn producto que contenga tabaco, lo que incluye cigarrillos, tabaco de Higher education careers adviser o Psychologist, sport and exercise.  Si necesita ayuda para dejar de fumar, consulte al mdico. SOLICITE AYUDA SI:  Siente que empeora o que no mejora.  Los medicamentos no logran [employer].  Tiene escalofros.  La dificultad para Museum/gallery exhibitions officer.  Tiene mucosidad marrn o roja.  Tiene una secrecin amarilla o marrn de la  Lawyer.  Le duele la cara, especialmente al inclinarse hacia adelante.  Tiene fiebre.  Tiene los ganglios del cuello hinchados.  Siente dolor al tragar.  Tiene zonas blancas en la parte de atrs de la garganta. SOLICITE AYUDA DE INMEDIATO SI:   Los siguientes sntomas son muy intensos o constantes:  Dolor de [place].  Dolor de odos.  Dolor en la frente, detrs de los ojos y por encima de los pmulos (dolor sinusal).  Dolor en el pecho.  Tiene enfermedad pulmonar prolongada (crnica) y cualquiera de estos sntomas:  Sibilancias.  Tos prolongada.  Tos con sangre.  Cambio en la mucosidad habitual.  Presenta rigidez en el cuello.  Tiene cambios en:  La visin.  La audicin.  El pensamiento.  El [place] de nimo. ASEGRESE DE QUE:   Comprende estas instrucciones.  Controlar su afeccin.  Recibir ayuda de inmediato si no mejora o si empeora.   Esta informacin no tiene Marine scientist el consejo del mdico. Asegrese de hacerle al mdico cualquier pregunta que tenga.   Document Released: 04/15/2011 Document Revised: 03/28/2015 Elsevier Interactive Patient Education 2016 [place] tensional (Tension Headache) Una cefalea tensional es un dolor o presin que se siente en la frente y los lados de la cabeza. Estas cefaleas pueden durar de 41minutos a varios das. CUIDADOS EN EL HOGAR Control del [employer] de venta libre y los recetados solamente como se lo haya indicado el mdico.  Cuando sienta dolor de cabeza acustese en un cuarto oscuro y tranquilo.  Si se lo indican, aplquese hielo en la zona de la cabeza y el cuello:  Ponga el hielo en una bolsa plstica.  Coloque una toalla entre la piel y la bolsa de hielo.  Coloque el hielo durante 28minutos, 2 a 3veces por Training and development officer.  Utilice una almohadilla trmica o tome una ducha caliente para aplicar calor en la zona de la cabeza y el cuello segn las indicaciones del  mdico. Comida y bebida  Mantenga un horario para las comidas.  No beba mucho alcohol.  No consuma mucha cafena ni deje de consumirla por completo. Instrucciones generales  Concurra a todas las visitas de control como se lo haya indicado el mdico. Esto es importante.  Lleve un registro diario para Neurosurgeon si ciertas cosas provocan los dolores de [place]. Por ejemplo, escriba los siguientes datos:  Lo que usted come y Buyer, retail.  Cunto tiempo duerme.  Algn cambio en su dieta o en los medicamentos.  Pruebe recibir Engineer, production o hacer otras cosas que lo ayuden a Nurse, children's.  Disminuya el nivel de estrs.  Sintese con la espalda recta. No contraiga (tensione) los msculos.  No consuma productos que contengan tabaco. Estos incluyen cigarrillos, tabaco para mascar y Psychologist, sport and exercise. Si necesita ayuda para dejar de fumar, consulte al mdico.  Haga ejercicios con regularidad tal como se lo indic el mdico.  Duerma lo suficiente. Es Software engineer, entre 7 y 9horas de sueo. SOLICITE AYUDA SI:  Los medicamentos no logran [employer].  Tiene un dolor de cabeza que es diferente del dolor de cabeza habitual.  Tiene Tree surgeon estomacal (nuseas) o vomita.  Tiene fiebre. SOLICITE  AYUDA DE INMEDIATO SI:  La cefalea es muy intensa.  Sigue vomitando.  Presenta rigidez en el cuello.  Tiene dificultad para ver.  Tiene dificultad para hablar.  Siente dolor en el ojo o en el odo.  Sus msculos estn dbiles, o pierde el control muscular.  Pierde el equilibrio o tiene problemas para Writer.  Siente que se desvanece (pierde el conocimiento) o se desmaya.  Se siente confundido.   Esta informacin no tiene Marine scientist el consejo del mdico. Asegrese de hacerle al mdico cualquier pregunta que tenga.   Document Released: 02/03/2012 Document Revised: 08/02/2015 Elsevier Interactive Patient Education [employer].

## 2016-02-14 ENCOUNTER — Encounter (HOSPITAL_COMMUNITY): Payer: Self-pay | Admitting: Emergency Medicine

## 2016-02-14 ENCOUNTER — Emergency Department (HOSPITAL_COMMUNITY)
Admission: EM | Admit: 2016-02-14 | Discharge: 2016-02-14 | Disposition: A | Discharge status: Home / Self Care | Payer: Medicaid Other | Source: Home / Self Care | Attending: Emergency Medicine | Admitting: Emergency Medicine

## 2016-02-14 ENCOUNTER — Other Ambulatory Visit (HOSPITAL_COMMUNITY)
Admission: RE | Admit: 2016-02-14 | Discharge: 2016-02-14 | Disposition: A | Discharge status: Home / Self Care | Payer: Medicaid Other | Source: Ambulatory Visit | Attending: Emergency Medicine | Admitting: Emergency Medicine

## 2016-02-14 DIAGNOSIS — N898 Other specified noninflammatory disorders of vagina: Secondary | ICD-10-CM

## 2016-02-14 DIAGNOSIS — Z113 Encounter for screening for infections with a predominantly sexual mode of transmission: Secondary | ICD-10-CM | POA: Diagnosis present

## 2016-02-14 DIAGNOSIS — M6248 Contracture of muscle, other site: Secondary | ICD-10-CM

## 2016-02-14 DIAGNOSIS — N760 Acute vaginitis: Secondary | ICD-10-CM | POA: Diagnosis present

## 2016-02-14 DIAGNOSIS — M62838 Other muscle spasm: Secondary | ICD-10-CM

## 2016-02-14 MED ORDER — DIAZEPAM 5 MG PO TABS: 5.0000 mg | ORAL_TABLET | Freq: Every evening | ORAL | Status: DC | PRN

## 2016-02-14 MED ORDER — HYDROCORTISONE 2.5 % EX OINT: TOPICAL_OINTMENT | CUTANEOUS | Status: DC

## 2016-02-14 NOTE — ED Provider Notes (Signed)
CSN: [id number]     Arrival date & time 02/14/16  1312 History   First MD Initiated Contact with Patient 02/14/16 1427     Chief Complaint  Patient presents with  . Neck Pain   (Consider location/radiation/quality/duration/timing/severity/associated sxs/prior Treatment) HPI She is a 45 year old woman here for evaluation of right-sided neck pain. Is been an ongoing issue for her since her subacute strokes were identified. She states it has been doing okay, but has been worsening over the last several weeks. She does report several life stressors including a hospitalized brother with colon cancer.  Pain is located in the right neck and right shoulder. It does not radiate. No weakness or numbness in the arms. She reports some mild discomfort in the left shoulder, but more on the right side. She is doing hot showers, which she states do help quite a bit.  She has been on several muscle relaxers in the past without improvement. She's also tried diclofenac gel without improvement.  She is also reporting some vaginal and full for irritation after sex. She states that for a day or 2 after having sex she will have itching of the vulvar area. She denies any discharge. She is sexually active with her husband. She is concerned about infection as her husband had been living in [place] until recently. He was also recently diagnosed with primary herpetic gingivostomatitis.  Past Medical History  Diagnosis Date  . Stroke [facility])     09/2015   Past Surgical History  Procedure Laterality Date  . Tee without cardioversion N/A 10/13/2015    Procedure: TRANSESOPHAGEAL ECHOCARDIOGRAM (TEE);  Surgeon: Lelon Perla, MD;  Location: [facility] ENDOSCOPY;  Service: Cardiovascular;  Laterality: N/A;  . Right arm surgery     Family History  Problem Relation Age of Onset  . Asthma Neg Hx   . Cancer Neg Hx   . Diabetes Neg Hx   . Hyperlipidemia Neg Hx   . Heart failure Neg Hx   . Hypertension Neg Hx   . Migraines Neg Hx    . Rashes / Skin problems Neg Hx   . Seizures Neg Hx   . Stroke Neg Hx   . Thyroid disease Neg Hx   . Leukemia Mother    Social History  Substance Use Topics  . Smoking status: Never Smoker   . Smokeless tobacco: Never Used  . Alcohol Use: No   OB History    No data available     Review of Systems As in history of present illness Allergies  Tramadol  Home Medications   Prior to Admission medications   Medication Sig Start Date End Date Taking? Authorizing Provider  aspirin 325 MG tablet Take 1 tablet (325 mg total) by mouth daily. 12/11/15   Micheline Chapman, NP  diazepam (VALIUM) 5 MG tablet Take 1 tablet (5 mg total) by mouth at bedtime as needed for muscle spasms. 02/14/16   Melony Overly, MD  hydrocortisone 2.5 % ointment Apply to itchy skin twice a day as needed 02/14/16   Melony Overly, MD  ibuprofen (ADVIL,MOTRIN) 200 MG tablet Take 200 mg by mouth every 6 (six) hours as needed.    Historical Provider, MD  ondansetron (ZOFRAN) 4 MG tablet Take 1 tablet (4 mg total) by mouth every 8 (eight) hours as needed for nausea. May cause constipation 02/06/16   Janne Napoleon, NP  pravastatin (PRAVACHOL) 40 MG tablet Take 1 tablet (40 mg total) by mouth daily. 12/11/15   Vaughan Basta  Reyes Ivan, NP   Meds Ordered and Administered this Visit  Medications - No data to display  BP 127/72 mmHg  Pulse 84  Temp(Src) 98.5 F (36.9 C) (Oral)  Resp 12  SpO2 97% No data found.   Physical Exam  Constitutional: She is oriented to person, place, and time. She appears well-developed and well-nourished. No distress.  HENT:  Mouth/Throat: Oropharynx is clear and moist. No oropharyngeal exudate.  Neck: Normal range of motion. Neck supple.  The right sternocleidomastoid muscle is quite tight and tender. She has mild spasm and tenderness in the right trapezius as well. Less so in the left trapezius. 5 out of 5 strength in bilateral upper extremities. 2+ radial pulse bilaterally. Sensation grossly intact.   Cardiovascular: Normal rate.   Pulmonary/Chest: Effort normal.  Neurological: She is alert and oriented to person, place, and time.    ED Course  Procedures (including critical care time)  Labs Review Labs Reviewed  HSV 1 ANTIBODY, IGG  HSV 2 ANTIBODY, IGG  HIV ANTIBODY (ROUTINE TESTING)  RPR  URINE CYTOLOGY ANCILLARY ONLY    Imaging Review No results found.   MDM   1. Muscle spasms of neck   2. Vaginal irritation    We'll treat muscle spasms with heat, massage, ibuprofen, and diazepam at bedtime. I suspect her vaginal irritation is due to friction during sexual encounters. I recommended the use of lubricant. I also provided a prescription for hydrocortisone cream to use as needed externally for itching. Blood and urine collected for STD testing. Follow-up as needed.    Melony Overly, MD 02/14/16 [phone]

## 2016-02-14 NOTE — Discharge Instructions (Signed)
The pain in your neck is coming from tight muscles. This is likely from the changes and stress in your life. Use the diazepam at bedtime as needed to help with the pain and muscle tightness. This medicine will make you sleepy. Apply heat as often as you can. This can be a hot shower, hot rag, or heating pad. Gentle massage with over-the-counter icy hot or BenGay will also be beneficial.  I suspect the vaginal itching you get it is related to irritation of the skin after sex. We have collected blood in urine for testing today. We will get the results back in 2-3 days. Try using the hydrocortisone ointment twice a day as needed for itching. Only apply this to the outside skin.  Follow-up as needed.  El dolor en el cuello viene de los msculos apretados. Esto es probable de los cambios y Dealer en su vida. Use el diazepam a la hora de acostarse cuando sea necesario para ayudar con el dolor y la tensin muscular. Este USAA har dormir. Aplique calor lo ms que pueda. Esto puede ser Ardelia Mems ducha caliente, trapo caliente o almohadilla de calefaccin. Masaje suave con over-the-counter glido caliente o BenGay tambin ser beneficioso.  Sospecho que la picazn vaginal que se obtiene est relacionada con la irritacin de la piel despus del sexo. Hemos recogido [employer] orina para pruebas hoy. Obtendremos los [place] [employer] 2-3 das. Trate de usar el ungento de hidrocortisona dos veces al da segn sea necesario para Engineer, agricultural. Slo aplique esto a la piel exterior.  Seguimiento segn sea necesario.

## 2016-02-14 NOTE — ED Notes (Signed)
Complains of right neck pain for 3 weeks

## 2016-02-15 ENCOUNTER — Ambulatory Visit: Payer: Medicaid Other | Admitting: Nurse Practitioner

## 2016-02-15 LAB — URINE CYTOLOGY ANCILLARY ONLY
Chlamydia: NEGATIVE
NEISSERIA GONORRHEA: NEGATIVE
TRICH (WINDOWPATH): NEGATIVE

## 2016-02-15 LAB — HSV 1 ANTIBODY, IGG: HSV 1 Glycoprotein G Ab, IgG: 52.4 index — ABNORMAL HIGH (ref 0.00–0.90)

## 2016-02-19 ENCOUNTER — Ambulatory Visit (INDEPENDENT_AMBULATORY_CARE_PROVIDER_SITE_OTHER): Payer: Medicaid Other | Admitting: Neurology

## 2016-02-19 ENCOUNTER — Encounter: Payer: Self-pay | Admitting: Neurology

## 2016-02-19 VITALS — BP 119/81 | HR 101 | Ht 63.0 in | Wt 120.0 lb

## 2016-02-19 DIAGNOSIS — M542 Cervicalgia: Secondary | ICD-10-CM

## 2016-02-19 DIAGNOSIS — I639 Cerebral infarction, unspecified: Secondary | ICD-10-CM | POA: Diagnosis not present

## 2016-02-19 LAB — RPR: RPR Ser Ql: NONREACTIVE

## 2016-02-19 LAB — HIV ANTIBODY (ROUTINE TESTING W REFLEX): HIV SCREEN 4TH GENERATION: NONREACTIVE

## 2016-02-19 LAB — HSV 2 ANTIBODY, IGG

## 2016-02-19 LAB — HSV 1 ANTIBODY, IGG: HSV 1 GLYCOPROTEIN G AB, IGG: 52.4 {index} — AB (ref 0.00–0.90)

## 2016-02-19 MED ORDER — CYCLOBENZAPRINE HCL 5 MG PO TABS: 2.5000 mg | ORAL_TABLET | Freq: Three times a day (TID) | ORAL | Status: DC
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
## 2016-02-21 ENCOUNTER — Telehealth (HOSPITAL_COMMUNITY): Payer: Self-pay | Admitting: Emergency Medicine

## 2016-02-21 NOTE — ED Notes (Signed)
x1 attempt  Called [phone]; NA Need to give lab results from recent visit on 3/22  Per Dr. 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 No treatment needed unless having episodes of blisters. Recheck as needed.  Gonorrhea/chlamydia/trichomonas tests were negative, as were HIV and syphilis (rpr) tests. LM  Will try later.

## 2016-02-22 LAB — MISC LABCORP TEST (SEND OUT): LABCORP TEST CODE: 164897

## 2016-02-28 NOTE — ED Notes (Signed)
x2 attempt  LM on pt's VM; notified her that she can pull her labs from [place]; to call if she has any further questions or concerns  Need to give lab results from recent visit on 3/22  Per Dr. 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 No treatment needed unless having episodes of blisters. Recheck as needed.  Gonorrhea/chlamydia/trichomonas tests were negative, as were HIV and syphilis (rpr) tests. LM

## 2016-03-05 ENCOUNTER — Encounter (HOSPITAL_COMMUNITY): Payer: Self-pay | Admitting: Emergency Medicine

## 2016-03-05 ENCOUNTER — Emergency Department (HOSPITAL_COMMUNITY)
Admission: EM | Admit: 2016-03-05 | Discharge: 2016-03-06 | Disposition: A | Discharge status: Home / Self Care | Payer: Medicaid Other | Attending: Emergency Medicine | Admitting: Emergency Medicine

## 2016-03-05 DIAGNOSIS — X58XXXA Exposure to other specified factors, initial encounter: Secondary | ICD-10-CM | POA: Insufficient documentation

## 2016-03-05 DIAGNOSIS — Y9289 Other specified places as the place of occurrence of the external cause: Secondary | ICD-10-CM | POA: Insufficient documentation

## 2016-03-05 DIAGNOSIS — R51 Headache: Secondary | ICD-10-CM | POA: Insufficient documentation

## 2016-03-05 DIAGNOSIS — R519 Headache, unspecified: Secondary | ICD-10-CM

## 2016-03-05 DIAGNOSIS — Z79899 Other long term (current) drug therapy: Secondary | ICD-10-CM | POA: Insufficient documentation

## 2016-03-05 DIAGNOSIS — Z3202 Encounter for pregnancy test, result negative: Secondary | ICD-10-CM | POA: Insufficient documentation

## 2016-03-05 DIAGNOSIS — S161XXA Strain of muscle, fascia and tendon at neck level, initial encounter: Secondary | ICD-10-CM | POA: Insufficient documentation

## 2016-03-05 DIAGNOSIS — Z7982 Long term (current) use of aspirin: Secondary | ICD-10-CM | POA: Insufficient documentation

## 2016-03-05 DIAGNOSIS — R112 Nausea with vomiting, unspecified: Secondary | ICD-10-CM | POA: Insufficient documentation

## 2016-03-05 DIAGNOSIS — R5383 Other fatigue: Secondary | ICD-10-CM | POA: Insufficient documentation

## 2016-03-05 DIAGNOSIS — Y998 Other external cause status: Secondary | ICD-10-CM | POA: Insufficient documentation

## 2016-03-05 DIAGNOSIS — Y9389 Activity, other specified: Secondary | ICD-10-CM | POA: Insufficient documentation

## 2016-03-05 NOTE — ED Provider Notes (Signed)
CSN: [id number]     Arrival date & time 03/05/16  1445 History   By signing my name below, I, Altamease Oiler, attest that this documentation has been prepared under the direction and in the presence of Malvin Johns, MD. Electronically Signed: Altamease Oiler, ED Scribe. 03/06/2016. 4:35 AM   Chief Complaint  Patient presents with  . Headache   HPI Carla [name] is a 45 y.o. female with history of stroke who presents to the Emergency Department complaining of a right-sided headache with onset last night. The headache lasted for about 4 hours before she took ibuprofen and went to sleep. The pain had resolved when she woke up this morning. She also complains of right sided neck pain since she had a stroke 4 months ago. The pain radiates down the right arm and into the shoulder. She has an MRI ordered and is scheduled to be seen by her neurologist for the pain in 2 weeks.  Associated symptoms include fatigue for 2 weeks, nausea, 2 episodes of emesis. Pt denies numbness, weakness, vision change, gait difficulty, speech difficulty, fever, cough, chest pain, and abdominal pain .   Past Medical History  Diagnosis Date  . Stroke [facility])     09/2015   Past Surgical History  Procedure Laterality Date  . Tee without cardioversion N/A 10/13/2015    Procedure: TRANSESOPHAGEAL ECHOCARDIOGRAM (TEE);  Surgeon: Lelon Perla, MD;  Location: [facility] ENDOSCOPY;  Service: Cardiovascular;  Laterality: N/A;  . Right arm surgery     Family History  Problem Relation Age of Onset  . Asthma Neg Hx   . Cancer Neg Hx   . Diabetes Neg Hx   . Hyperlipidemia Neg Hx   . Heart failure Neg Hx   . Hypertension Neg Hx   . Migraines Neg Hx   . Rashes / Skin problems Neg Hx   . Seizures Neg Hx   . Stroke Neg Hx   . Thyroid disease Neg Hx   . Leukemia Mother    Social History  Substance Use Topics  . Smoking status: Never Smoker   . Smokeless tobacco: Never Used  . Alcohol Use: No   OB History    No data available      Review of Systems  Constitutional: Positive for fatigue. Negative for unexpected weight change.  Eyes: Negative for visual disturbance.  Respiratory: Negative for cough and shortness of breath.   Cardiovascular: Negative for chest pain.  Gastrointestinal: Positive for nausea and vomiting. Negative for abdominal pain.  Musculoskeletal: Positive for neck pain. Negative for gait problem.  Neurological: Positive for headaches. Negative for speech difficulty, weakness and numbness.  All other systems reviewed and are negative.     Allergies  Tramadol  Home Medications   Prior to Admission medications   Medication Sig Start Date End Date Taking? Authorizing Provider  aspirin 325 MG tablet Take 1 tablet (325 mg total) by mouth daily. 12/11/15  Yes Micheline Chapman, NP  cyclobenzaprine (FLEXERIL) 5 MG tablet Take 0.5 tablets (2.5 mg total) by mouth 3 (three) times daily. 02/19/16  Yes Garvin Fila, MD  Multiple Vitamin (MULTIVITAMIN WITH MINERALS) TABS tablet Take 1 tablet by mouth daily.   Yes Historical Provider, MD  pravastatin (PRAVACHOL) 40 MG tablet Take 1 tablet (40 mg total) by mouth daily. Patient not taking: Reported on 03/06/2016 12/11/15   Micheline Chapman, NP   BP 141/96 mmHg  Pulse 80  Temp(Src) 98.1 F (36.7 C) (Oral)  Resp 18  SpO2 100%  LMP 01/22/2016 Physical Exam  Constitutional: She is oriented to person, place, and time. She appears well-developed and well-nourished. No distress.  HENT:  Head: Normocephalic and atraumatic.  Eyes: EOM are normal.  Neck: Normal range of motion. Neck supple.  Mild tenderness to the right sternocleidomastoid muscle. There is no bony tenderness to the cervical spine  Cardiovascular: Normal rate, regular rhythm and normal heart sounds.   Pulmonary/Chest: Effort normal and breath sounds normal.  Abdominal: Soft. She exhibits no distension. There is no tenderness.  Musculoskeletal: Normal range of motion.  Neurological: She is  alert and oriented to person, place, and time. She has normal strength. No cranial nerve deficit or sensory deficit. GCS eye subscore is 4. GCS verbal subscore is 5. GCS motor subscore is 6.  Finger-nose intact, no pronator drift  Skin: Skin is warm and dry.  Psychiatric: She has a normal mood and affect. Judgment normal.  Nursing note and vitals reviewed.   ED Course  Procedures (including critical care time) DIAGNOSTIC STUDIES: Oxygen Saturation is 100% on RA,  normal by my interpretation.    COORDINATION OF CARE: 11:41 PM Discussed treatment plan which includes lab work with pt at bedside and pt agreed to plan.  Labs Review Labs Reviewed  BASIC METABOLIC PANEL - Abnormal; Notable for the following:    Glucose, Bld 107 (*)    All other components within normal limits  CBC WITH DIFFERENTIAL/PLATELET - Abnormal; Notable for the following:    WBC 11.6 (*)    Hemoglobin 11.5 (*)    HCT 35.7 (*)    Neutro Abs 8.1 (*)    All other components within normal limits  URINALYSIS, ROUTINE W REFLEX MICROSCOPIC (NOT AT [facility]) - Abnormal; Notable for the following:    Hgb urine dipstick SMALL (*)    Ketones, ur 40 (*)    All other components within normal limits  URINE MICROSCOPIC-ADD ON - Abnormal; Notable for the following:    Squamous Epithelial / LPF 0-5 (*)    Bacteria, UA RARE (*)    All other components within normal limits  PREGNANCY, URINE    Imaging Review No results found. I have personally reviewed and evaluated these lab results as part of my medical decision-making.   EKG Interpretation None      MDM   Final diagnoses:  Headache, unspecified headache type  Neck strain, initial encounter    Patient presents after she had a headache last night. She hasn't had any headache today. She doesn't have any neurologic deficits. There is no concerns for an acute stroke. She does have some right-sided neck pain which seems to be musculoskeletal. She states it's been there  since she had her prior stroke several months ago. She is scheduled to get an MRI of the area. It's tender over the sternocleidomastoid muscle and it's worse with movement. There is no radicular symptoms or associated weakness or numbness to the extremities. She is currently asymptomatic. She was discharged home in good condition. She did complain of some fatigue so some basic labs were checked as well as a urinalysis. These results are non-concerning. She was discharged from good condition. She was encouraged to follow-up with her primary care physician or return here as needed for any worsening symptoms.  I personally performed the services described in this documentation, which was scribed in my presence.  The recorded information has been reviewed and considered.    Malvin Johns, MD 03/06/16 [phone]

## 2016-03-05 NOTE — ED Notes (Signed)
Pt states around 8pm last night she had a sudden onset of a headache that was throbbing. Pt states this am she took 2 motrin and headache went away but pt states she had a stroke in November so wanted to come to "be checked to make sure she wasn't having another one.". Denies any vision changes or dizziness. No neuro deficits noted at triage.

## 2016-03-06 LAB — CBC WITH DIFFERENTIAL/PLATELET
BASOS ABS: 0 10*3/uL (ref 0.0–0.1)
BASOS PCT: 0 %
Eosinophils Absolute: 0.1 10*3/uL (ref 0.0–0.7)
Eosinophils Relative: 1 %
HEMATOCRIT: 35.7 % — AB (ref 36.0–46.0)
HEMOGLOBIN: 11.5 g/dL — AB (ref 12.0–15.0)
Lymphocytes Relative: 23 %
Lymphs Abs: 2.6 10*3/uL (ref 0.7–4.0)
MCH: 28.7 pg (ref 26.0–34.0)
MCHC: 32.2 g/dL (ref 30.0–36.0)
MCV: 89 fL (ref 78.0–100.0)
MONOS PCT: 6 %
Monocytes Absolute: 0.7 10*3/uL (ref 0.1–1.0)
NEUTROS ABS: 8.1 10*3/uL — AB (ref 1.7–7.7)
NEUTROS PCT: 70 %
Platelets: 336 10*3/uL (ref 150–400)
RBC: 4.01 MIL/uL (ref 3.87–5.11)
RDW: 13 % (ref 11.5–15.5)
WBC: 11.6 10*3/uL — ABNORMAL HIGH (ref 4.0–10.5)

## 2016-03-06 LAB — URINE MICROSCOPIC-ADD ON

## 2016-03-06 LAB — BASIC METABOLIC PANEL
ANION GAP: 8 (ref 5–15)
BUN: 12 mg/dL (ref 6–20)
CALCIUM: 9 mg/dL (ref 8.9–10.3)
CHLORIDE: 104 mmol/L (ref 101–111)
CO2: 26 mmol/L (ref 22–32)
Creatinine, Ser: 0.9 mg/dL (ref 0.44–1.00)
GFR calc non Af Amer: >60 mL/min (ref >60)
Glucose, Bld: 107 mg/dL — ABNORMAL HIGH (ref 65–99)
POTASSIUM: 3.7 mmol/L (ref 3.5–5.1)
Sodium: 138 mmol/L (ref 135–145)

## 2016-03-06 LAB — URINALYSIS, ROUTINE W REFLEX MICROSCOPIC
BILIRUBIN URINE: NEGATIVE
Glucose, UA: NEGATIVE mg/dL
Ketones, ur: 40 mg/dL — AB
Leukocytes, UA: NEGATIVE
NITRITE: NEGATIVE
Protein, ur: NEGATIVE mg/dL
SPECIFIC GRAVITY, URINE: 1.016 (ref 1.005–1.030)
pH: 7 (ref 5.0–8.0)

## 2016-03-06 LAB — PREGNANCY, URINE: Preg Test, Ur: NEGATIVE

## 2016-03-06 NOTE — ED Notes (Signed)
Pt verbalized understanding of discharge instructions and follow-up care. Vital signs stable at discharge. 

## 2016-03-06 NOTE — Discharge Instructions (Signed)
Distensin cervical (Cervical Sprain) Una distensin cervical es una lesin en el cuello, en la que los tejidos fuertes y fibrosos (ligamentos) que unen los huesos del cuello, se distienden o se rompen. Una distensin cervical puede ser desde muy leve a muy grave. En los casos graves pueden hacer que las vrtebras del cuello se vuelvan inestables. Esto puede causar un dao en la mdula espinal y puede dar Environmental consultant a graves problemas del [place]. La cantidad de tiempo que demora la mejora de una distensin cervical depende de la causa y de la extensin de la lesin. [place] veces se cura en 1 a 3 semanas. CAUSAS  Las distensiones graves pueden ser causadas por:   Lesiones por deportes de contacto (como en el ftbol Scientist, forensic, rugby, [place], hockey, automovilismo, gimnasia, buceo, artes [employer] y boxeo).  Colisiones en vehculos de motor.  Lesiones de Buyer, retail cervical. Esta es una lesin por movimiento brusco de adelante hacia atrs de la cabeza y el cuello.  Cadas. [place] distensiones cervicales leves pueden ser:   Adoptar posiciones incmodas, como sostener el telfono entre la oreja y [place].  Sentarse en una silla que no ofrece el soporte adecuado.  Trabajar en una mesa de computadora mal diseada.  Las [employer] que requieren mirar hacia arriba o hacia abajo durante largos perodos. SNTOMAS   Dolor, sensibilidad, rigidez, o sensacin de ardor en la parte anterior, posterior o lateral del cuello. Este malestar puede aparecer inmediatamente despus de la lesin o puede desarrollarse lentamente y no empezar hasta 24 horas o ms despus de la lesin.  Dolor o sensibilidad que se siente directamente en la parte media posterior del cuello.  Dolor en el hombro o la zona superior de la espalda.  Capacidad limitada para mover el cuello.  Dolor de [place].  Mareos.  Debilidad, entumecimiento u hormigueo en las manos o los brazos.  Espasmos  musculares.  Dificultades para tragar o comer.  Sensibilidad e hinchazn en el cuello. [place] veces, el mdico puede diagnosticar este problema mediante la historia clnica y un examen fsico. Su mdico le preguntar acerca de lesiones previas y problemas conocidos como artritis en el cuello. Podrn tomarle radiografas para determinar si hay otros problemas, como enfermedades en los huesos del cuello. Tambin puede ser [employer] realizar otras pruebas, como tomografas computadas o Health visitor.  TRATAMIENTO  El tratamiento depende de la gravedad de la distensin. Las distensiones leves se pueden tratar con reposo, manteniendo el cuello en su lugar (inmovilizacin) y usando medicamentos para Conservation officer, historic buildings. Las distensiones graves deben ser inmediatamente inmovilizadas. Ser necesario completar el tratamiento para Best boy, los espasmos musculares y otros sntomas, y puede incluir.  Medicamentos como calmantes para el dolor, anestsicos o relajantes musculares.  Fisioterapia. Esto puede incluir ejercicios de elongacin, fortalecimiento y Chiropodist de Advertising copywriter. Los ejercicios y [place] mejor postura pueden ayudar a estabilizar el cuello, fortalecer los msculos y evitar que los sntomas vuelvan a Arts administrator. INSTRUCCIONES PARA EL CUIDADO EN EL HOGAR   Aplique hielo sobre la zona lesionada.  Ponga el hielo en una bolsa plstica.  Colquese una toalla entre la piel y la bolsa de hielo.  Deje el hielo durante 15 - 20 minutos y aplquelo 3 - 4 veces por Training and development officer.  Si la lesin fue grave, le indicarn el uso de un collarn cervical. El collarn cervical es un collar de dos piezas para impedir que el cuello se mueva [place] se [place].  Nose quite el collarn excepto que se lo indique su mdico.  Si tiene el cabello largo, mantngalo fuera del collarn.  Consulte a su mdico antes de hacerle ajustes. Los [employer] pueden ser requeridos con el tiempo para  Garment/textile technologist confort y reducir la presin sobre la barbilla o en la parte posterior de la cabeza.  Si le permiten quitarse el collarn para lavarlo o darse un bao, siga las indicaciones de su mdico acerca de cmo hacerlo con seguridad.  Mantenga el collarn limpio pasando un pao con agua y [place] y secndolo bien. Si el collarn tiene almohadillas removibles, qutelas cada 1-2 das para lavarlas a mano con agua y [place]. Deje que se sequen al aire. Debe secarlas bien antes de volver a colocarlas en el collarn.  Si le permiten quitarse el collarn para lavarlo y darse un bao, lave y seque la piel del cuello. Controle su piel para detectar irritacin o llagas. Si las tiene, informe a su mdico.  No conduzca vehculos mientras [place] el collarn.  Slo tome medicamentos de venta libre o recetados para Glass blower/designer, el malestar o bajar la fiebre, segn las indicaciones de su mdico.  Cumpla con todas las visitas de control, segn le indique su mdico.  Cumpla con todas las sesiones de fisioterapia, segn le indique su mdico.  Haga los ajustes necesarios en su lugar de [place] para favorecer una buena postura.  Evite las posiciones y actividades que [employer] sntomas.  Haga precalentamiento y elongue antes de comenzar una actividad para Customer service manager. SOLICITE ATENCIN MDICA SI:   El dolor no se alivia con los [employer].  No puede disminuir la dosis de analgsicos segn lo planificado.  Su nivel de actividad no mejora segn lo esperado. SOLICITE ATENCIN MDICA DE INMEDIATO SI:   Presenta cualquier hemorragia.  Siente Higher education careers adviser.  Tiene signos de reaccin alrgica a los medicamentos.  Los sntomas empeoran.  Le aparecen sntomas nuevos e inexplicables.  Siente adormecimiento, hormigueo, debilidad o parlisis en alguna parte del cuerpo. ASEGRESE DE QUE:   Comprende estas instrucciones.  Controlar su afeccin.  Recibir ayuda de inmediato si no mejora o  si empeora.   Esta informacin no tiene Marine scientist el consejo del mdico. Asegrese de hacerle al mdico cualquier pregunta que tenga.   Document Released: 02/07/2009 Document Revised: 09/01/2013 Elsevier Interactive Patient Education 2016 [place] general sin causa (General Headache Without Cause) El dolor de cabeza es un dolor o Tree surgeon que se siente en la zona de la cabeza o del cuello. Hay muchas causas y tipos de dolores de [place]. En algunos casos, es posible que no se encuentre la causa.  CUIDADOS EN EL HOGAR  Control del [employer] de venta libre y los recetados solamente como se lo haya indicado el mdico.  Cuando sienta dolor de cabeza acustese en un cuarto oscuro y tranquilo.  Si se lo indican, aplique hielo sobre la cabeza y la zona del cuello:  Ponga el hielo en una bolsa plstica.  Coloque una toalla entre la piel y la bolsa de hielo.  Coloque el hielo durante 43minutos, 2 a 3veces por Training and development officer.  Utilice una almohadilla trmica o tome una ducha con agua caliente para aplicar calor en la cabeza y la zona del cuello como se lo haya indicado el [place] luces tenues si le [employer] luces brillantes o sus dolores de cabeza empeoran. Comida y bebida  Mantenga un  horario para las comidas.  Beba menos alcohol.  Consuma menos o deje de tomar cafena. Instrucciones generales  Concurra a todas las visitas de control como se lo haya indicado el mdico. Esto es importante.  Lleve un registro diario para Neurosurgeon si ciertas cosas provocan los dolores de [place]. Por ejemplo, escriba los siguientes datos:  Lo que usted come y Buyer, retail.  Cunto tiempo duerme.  Algn cambio en su dieta o en los medicamentos.  Realice actividades relajantes, como recibir [place].  Disminuya el nivel de estrs.  Sintese con la espalda recta. No contraiga (tensione) los msculos.  No consuma productos que contengan tabaco.  Estos incluyen cigarrillos, tabaco para mascar y Psychologist, sport and exercise. Si necesita ayuda para dejar de fumar, consulte al mdico.  Haga ejercicios con regularidad tal como se lo indic el mdico.  Duerma lo suficiente. Esto a menudo significa entre 7 y 9horas de sueo. SOLICITE AYUDA SI:  Los medicamentos no logran [employer].  Tiene un dolor de cabeza que es diferente a los otros dolores de [place].  Tiene malestar estomacal (nuseas) o vomita.  Tiene fiebre. SOLICITE AYUDA DE INMEDIATO SI:   El dolor de [place].  Sigue vomitando.  Presenta rigidez en el cuello.  Tiene dificultad para ver.  Tiene dificultad para hablar.  Siente dolor en el ojo o en el odo.  Sus msculos estn dbiles, o pierde el control muscular.  Pierde el equilibrio o tiene problemas para Writer.  Siente que se desvanece (pierde el conocimiento) o se desmaya.  Se siente confundido.   Esta informacin no tiene Marine scientist el consejo del mdico. Asegrese de hacerle al mdico cualquier pregunta que tenga.   Document Released: 02/03/2012 Document Revised: 08/02/2015 Elsevier Interactive Patient Education [employer].

## 2016-03-11 ENCOUNTER — Ambulatory Visit (INDEPENDENT_AMBULATORY_CARE_PROVIDER_SITE_OTHER): Payer: Medicaid Other | Admitting: Family Medicine

## 2016-03-11 ENCOUNTER — Encounter: Payer: Self-pay | Admitting: Family Medicine

## 2016-03-11 VITALS — BP 113/73 | HR 76 | Temp 97.8°F | Ht 63.0 in | Wt 115.0 lb

## 2016-03-11 DIAGNOSIS — Z800 Family history of malignant neoplasm of digestive organs: Secondary | ICD-10-CM

## 2016-03-11 DIAGNOSIS — Z1239 Encounter for other screening for malignant neoplasm of breast: Secondary | ICD-10-CM | POA: Diagnosis not present

## 2016-03-11 DIAGNOSIS — I6350 Cerebral infarction due to unspecified occlusion or stenosis of unspecified cerebral artery: Secondary | ICD-10-CM

## 2016-03-11 DIAGNOSIS — H8110 Benign paroxysmal vertigo, unspecified ear: Secondary | ICD-10-CM

## 2016-03-11 MED ORDER — ASPIRIN 325 MG PO TABS: 325.0000 mg | ORAL_TABLET | Freq: Every day | ORAL | Status: DC

## 2016-03-11 MED ORDER — PRAVASTATIN SODIUM 40 MG PO TABS: 40.0000 mg | ORAL_TABLET | Freq: Every day | ORAL | Status: DC

## 2016-03-11 NOTE — Progress Notes (Signed)
Patient ID: Carla [name], female   DOB: 28-Sep-1971, 45 y.o.   MRN: [id number]   Carla [name], is a 45 y.o. female  [id number]  [id number]  DOB - [birth year]  CC:  Chief Complaint  Patient presents with  . follow up    worried because she is loosing weight, no appetite, worried about brother who has cancer, needs refill on meds, went to ER with pain in right neck area still has discomfort today       HPI: Carla [name] is a 45 y.o. female here for refill of ASA 325 and pravastatin 40 for secondary prevention of 2nd CVA. She is concerned about loss of appetite and weight loss over the last month. She has recently found out her brother has colon cancer at age 14. Her main complaint is of right head and neck pain. She was recently seen and treated for same by Dr. Pearlean Brownie.   Health Maintenance: Screening for colon cancer and mammogram entered. Will return for PAP and Tdap.   Allergies  Allergen Reactions  . Tramadol Nausea Only   Past Medical History  Diagnosis Date  . Stroke [facility])     09/2015   Current Outpatient Prescriptions on File Prior to Visit  Medication Sig Dispense Refill  . Multiple Vitamin (MULTIVITAMIN WITH MINERALS) TABS tablet Take 1 tablet by mouth daily.    . cyclobenzaprine (FLEXERIL) 5 MG tablet Take 0.5 tablets (2.5 mg total) by mouth 3 (three) times daily. (Patient not taking: Reported on 03/11/2016) 30 tablet 1  . [DISCONTINUED] atorvastatin (LIPITOR) 40 MG tablet Take 1 tablet (40 mg total) by mouth daily at 6 PM. 30 tablet 2   No current facility-administered medications on file prior to visit.   Family History  Problem Relation Age of Onset  . Asthma Neg Hx   . Cancer Neg Hx   . Diabetes Neg Hx   . Hyperlipidemia Neg Hx   . Heart failure Neg Hx   . Hypertension Neg Hx   . Migraines Neg Hx   . Rashes / Skin problems Neg Hx   . Seizures Neg Hx   . Stroke Neg Hx   . Thyroid disease Neg Hx   . Leukemia Mother    Social History   Social History   . Marital Status: Single    Spouse Name: N/A  . Number of Children: N/A  . Years of Education: N/A   Occupational History  . Not on file.   Social History Main Topics  . Smoking status: Never Smoker   . Smokeless tobacco: Never Used  . Alcohol Use: No  . Drug Use: No  . Sexual Activity: Yes    Birth Control/ Protection: None   Other Topics Concern  . Not on file   Social History Narrative    Review of Systems: Constitutional: Negative for fever, chills. Positive for appetite change, weight loss,  Fatigue. Skin: Negative for rashes or lesions of concern. HENT: Negative for ear pain, ear discharge.nose bleeds Eyes: Negative for pain, discharge, redness, itching and visual disturbance. Neck: Negative for pain, stiffness Respiratory: Negative for cough, shortness of breath,   Cardiovascular: Negative for chest pain, palpitations and leg swelling. Gastrointestinal: Negative for abdominal pain, nausea, vomiting, diarrhea, constipations Genitourinary: Negative for dysuria, urgency, frequency, hematuria,  Musculoskeletal: Negative for back pain, joint pain, joint  swelling, and gait problem.Negative for weakness. Neurological: Negative for dizziness, tremors, seizures, syncope,   light-headedness, numbness. Positive for neck pain and headache Hematological: Negative for easy bruising  or bleeding Psychiatric/Behavioral: Negative for depression, anxiety, decreased concentration, confusion   Objective:   Filed Vitals:   03/11/16 1532  BP: 113/73  Pulse: 76  Temp: 97.8 F (36.6 C)    Physical Exam: Constitutional: Patient appears well-developed and well-nourished. No distress. HENT: Normocephalic, atraumatic, External right and left ear normal. Oropharynx is clear and moist.  Eyes: Conjunctivae and EOM are normal. PERRLA, no scleral icterus. Neck: Normal ROM. Neck supple. No lymphadenopathy, No thyromegaly. CVS: RRR, S1/S2 +, no murmurs, no gallops, no rubs Pulmonary:  Effort and breath sounds normal, no stridor, rhonchi, wheezes, rales.  Abdominal: Soft. Normoactive BS,, no distension, tenderness, rebound or guarding.  Musculoskeletal: Normal range of motion. No edema and no tenderness.  Neuro: Alert.Normal muscle tone coordination. Non-focal Skin: Skin is warm and dry. No rash noted. Not diaphoretic. No erythema. No pallor. Psychiatric: Normal mood and affect. Behavior, judgment, thought content normal.  Lab Results  Component Value Date   WBC 11.6* 03/06/2016   HGB 11.5* 03/06/2016   HCT 35.7* 03/06/2016   MCV 89.0 03/06/2016   PLT 336 03/06/2016   Lab Results  Component Value Date   CREATININE 0.90 03/06/2016   BUN 12 03/06/2016   NA 138 03/06/2016   K 3.7 03/06/2016   CL 104 03/06/2016   CO2 26 03/06/2016    Lab Results  Component Value Date   HGBA1C 5.6 10/12/2015   Lipid Panel     Component Value Date/Time   CHOL 207* 10/12/2015 0638   TRIG 89 10/12/2015 0638   HDL 50 10/12/2015 0638   CHOLHDL 4.1 10/12/2015 0638   VLDL 18 10/12/2015 0638   LDLCALC 139* 10/12/2015 0638       Assessment and plan:     1. Family history of colon cancer  - Ambulatory referral to Gastroenterology  2. Breast cancer screening - MM DIGITAL SCREENING BILATERAL; Future  3. 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  Dispense: 30 tablet; Refill: 2 - aspirin 325 MG tablet; Take 1 tablet (325 mg total) by mouth daily.  Dispense: 30 tablet; Refill: 2  4. Need for Cervical cancer screening -Return in next few weeks for PAP smear   Return in about 6 months (around 09/10/2016).  The patient was given clear instructions to go to ER or return to medical center if symptoms don't improve, worsen or new problems develop. The patient verbalized understanding.    Henrietta Hoover FNP  03/11/2016, 4:09 PM

## 2016-03-11 NOTE — Patient Instructions (Addendum)
Schedule for PAP smear for cervical cancer in the next few weeks. I am putting in a referral for colon cancer screening and breast cancer screening.  Watch weight and let me know if you continue to lose weight. Follow-up with neurologist about headaches.

## 2016-03-24 ENCOUNTER — Emergency Department (HOSPITAL_COMMUNITY): Payer: Medicaid Other

## 2016-03-24 ENCOUNTER — Emergency Department (HOSPITAL_COMMUNITY)
Admission: EM | Admit: 2016-03-24 | Discharge: 2016-03-24 | Disposition: A | Discharge status: Home / Self Care | Payer: Medicaid Other | Attending: Emergency Medicine | Admitting: Emergency Medicine

## 2016-03-24 ENCOUNTER — Encounter (HOSPITAL_COMMUNITY): Payer: Self-pay | Admitting: Emergency Medicine

## 2016-03-24 ENCOUNTER — Encounter (HOSPITAL_COMMUNITY): Payer: Self-pay | Admitting: *Deleted

## 2016-03-24 ENCOUNTER — Ambulatory Visit (HOSPITAL_COMMUNITY)
Admission: EM | Admit: 2016-03-24 | Discharge: 2016-03-24 | Disposition: A | Discharge status: Nursing Home (Includes ICF) | Payer: Medicaid Other | Attending: Emergency Medicine | Admitting: Emergency Medicine

## 2016-03-24 DIAGNOSIS — Z7982 Long term (current) use of aspirin: Secondary | ICD-10-CM | POA: Insufficient documentation

## 2016-03-24 DIAGNOSIS — I493 Ventricular premature depolarization: Secondary | ICD-10-CM | POA: Diagnosis not present

## 2016-03-24 DIAGNOSIS — E785 Hyperlipidemia, unspecified: Secondary | ICD-10-CM | POA: Insufficient documentation

## 2016-03-24 DIAGNOSIS — Z8673 Personal history of transient ischemic attack (TIA), and cerebral infarction without residual deficits: Secondary | ICD-10-CM | POA: Insufficient documentation

## 2016-03-24 DIAGNOSIS — Z79899 Other long term (current) drug therapy: Secondary | ICD-10-CM | POA: Diagnosis not present

## 2016-03-24 DIAGNOSIS — R079 Chest pain, unspecified: Secondary | ICD-10-CM

## 2016-03-24 DIAGNOSIS — R0600 Dyspnea, unspecified: Secondary | ICD-10-CM

## 2016-03-24 DIAGNOSIS — R002 Palpitations: Secondary | ICD-10-CM

## 2016-03-24 HISTORY — DX: Hyperlipidemia, unspecified: E78.5

## 2016-03-24 LAB — BASIC METABOLIC PANEL
Anion gap: 11 (ref 5–15)
BUN: 9 mg/dL (ref 6–20)
CHLORIDE: 108 mmol/L (ref 101–111)
CO2: 20 mmol/L — AB (ref 22–32)
Calcium: 8.9 mg/dL (ref 8.9–10.3)
Creatinine, Ser: 0.72 mg/dL (ref 0.44–1.00)
GFR calc Af Amer: >60 mL/min (ref >60)
GFR calc non Af Amer: >60 mL/min (ref >60)
GLUCOSE: 110 mg/dL — AB (ref 65–99)
POTASSIUM: 3.8 mmol/L (ref 3.5–5.1)
SODIUM: 139 mmol/L (ref 135–145)

## 2016-03-24 LAB — CBC WITH DIFFERENTIAL/PLATELET
Basophils Absolute: 0 10*3/uL (ref 0.0–0.1)
Basophils Relative: 0 %
EOS PCT: 0 %
Eosinophils Absolute: 0 10*3/uL (ref 0.0–0.7)
HEMATOCRIT: 38.1 % (ref 36.0–46.0)
HEMOGLOBIN: 12.6 g/dL (ref 12.0–15.0)
LYMPHS ABS: 1.1 10*3/uL (ref 0.7–4.0)
LYMPHS PCT: 8 %
MCH: 29.8 pg (ref 26.0–34.0)
MCHC: 33.1 g/dL (ref 30.0–36.0)
MCV: 90.1 fL (ref 78.0–100.0)
Monocytes Absolute: 0.6 10*3/uL (ref 0.1–1.0)
Monocytes Relative: 5 %
Neutro Abs: 11.8 10*3/uL — ABNORMAL HIGH (ref 1.7–7.7)
Neutrophils Relative %: 87 %
Platelets: 310 10*3/uL (ref 150–400)
RBC: 4.23 MIL/uL (ref 3.87–5.11)
RDW: 13.4 % (ref 11.5–15.5)
WBC: 13.6 10*3/uL — AB (ref 4.0–10.5)

## 2016-03-24 LAB — TROPONIN I: Troponin I: <0.03 ng/mL (ref <0.031)

## 2016-03-24 LAB — D-DIMER, QUANTITATIVE (NOT AT ARMC): D DIMER QUANT: 0.29 ug{FEU}/mL (ref 0.00–0.50)

## 2016-03-24 MED ORDER — SODIUM CHLORIDE 0.9 % IV SOLN
Freq: Once | INTRAVENOUS | Status: AC
  Administered 2016-03-24: 16:00:00 via INTRAVENOUS

## 2016-03-24 NOTE — ED Notes (Signed)
Patient transported to X-ray 

## 2016-03-24 NOTE — ED Notes (Signed)
[facility] notified of need for pt transport.  Report called to [place], ED Agricultural consultant.

## 2016-03-24 NOTE — ED Provider Notes (Signed)
CSN: [id number]     Arrival date & time 03/24/16  1646 History   First MD Initiated Contact with Patient 03/24/16 1649     Chief Complaint  Patient presents with  . Irregular Heart Beat     The history is provided by the patient. No language interpreter was used.   Carla [name] is a 45 y.o. female who presents to the Emergency Department complaining of palpitations.  She presents from urgent care for evaluation of palpitations. This morning when she awoke she reports feeling a pounding and irregular heartbeats with some mild chest discomfort. Symptoms are worse with breathing and she does have some associated shortness of breath. It was worse after church today around noon and she presented to urgent care. She reports that overall things are improving. She denies any fevers, cough, abdominal pain, vomiting, diarrhea, leg swelling or pain. No prior similar symptoms. She has a prior history of discogenic stroke and takes aspirin daily. The counter medicines. She drinks one cup of coffee daily. No energy drinks or supplements.  Past Medical History  Diagnosis Date  . Stroke ([place])     09/2015  . Hyperlipidemia    Past Surgical History  Procedure Laterality Date  . Tee without cardioversion N/A 10/13/2015    Procedure: TRANSESOPHAGEAL ECHOCARDIOGRAM (TEE);  Surgeon: Lelon Perla, MD;  Location: [facility] ENDOSCOPY;  Service: Cardiovascular;  Laterality: N/A;  . Right arm surgery     Family History  Problem Relation Age of Onset  . Asthma Neg Hx   . Cancer Neg Hx   . Diabetes Neg Hx   . Hyperlipidemia Neg Hx   . Heart failure Neg Hx   . Hypertension Neg Hx   . Migraines Neg Hx   . Rashes / Skin problems Neg Hx   . Seizures Neg Hx   . Stroke Neg Hx   . Thyroid disease Neg Hx   . Leukemia Mother    Social History  Substance Use Topics  . Smoking status: Never Smoker   . Smokeless tobacco: Never Used  . Alcohol Use: No   OB History    No data available     Review of Systems  All  other systems reviewed and are negative.     Allergies  Tramadol  Home Medications   Prior to Admission medications   Medication Sig Start Date End Date Taking? Authorizing Provider  aspirin 325 MG tablet Take 1 tablet (325 mg total) by mouth daily. 03/11/16  Yes Micheline Chapman, NP  Multiple Vitamin (MULTIVITAMIN WITH MINERALS) TABS tablet Take 1 tablet by mouth daily.   Yes Historical Provider, MD  pravastatin (PRAVACHOL) 40 MG tablet Take 1 tablet (40 mg total) by mouth daily. 03/11/16  Yes Micheline Chapman, NP  cyclobenzaprine (FLEXERIL) 5 MG tablet Take 0.5 tablets (2.5 mg total) by mouth 3 (three) times daily. Patient not taking: Reported on 03/11/2016 02/19/16   Garvin Fila, MD   BP 119/68 mmHg  Pulse 91  Temp(Src) 97.8 F (36.6 C) (Oral)  Resp 21  SpO2 99%  LMP 02/28/2016 (Approximate) Physical Exam  Constitutional: She is oriented to person, place, and time. She appears well-developed and well-nourished.  HENT:  Head: Normocephalic and atraumatic.  Cardiovascular: Normal rate.   No murmur heard. Regularly irregular heartbeat  Pulmonary/Chest: Effort normal and breath sounds normal. No respiratory distress.  Abdominal: Soft. There is no tenderness. There is no rebound and no guarding.  Musculoskeletal: She exhibits no edema or tenderness.  Neurological: She is alert and oriented to person, place, and time.  Skin: Skin is warm and dry.  Psychiatric: She has a normal mood and affect. Her behavior is normal.  Nursing note and vitals reviewed.   ED Course  Procedures (including critical care time) Labs Review Labs Reviewed  BASIC METABOLIC PANEL - Abnormal; Notable for the following:    CO2 20 (*)    Glucose, Bld 110 (*)    All other components within normal limits  CBC WITH DIFFERENTIAL/PLATELET - Abnormal; Notable for the following:    WBC 13.6 (*)    Neutro Abs 11.8 (*)    All other components within normal limits  TROPONIN I  D-DIMER, QUANTITATIVE (NOT  AT [facility])    Imaging Review Dg Chest 2 View  03/24/2016  CLINICAL DATA:  Left-sided chest pain. History of sickle cell anemia. EXAM: CHEST  2 VIEW COMPARISON:  CT chest 09/27/2015 FINDINGS: The heart size and mediastinal contours are within normal limits. Both lungs are clear. The visualized skeletal structures are unremarkable. IMPRESSION: No active cardiopulmonary disease. Electronically Signed   By: Kathreen Devoid   On: 03/24/2016 18:14   I have personally reviewed and evaluated these images and lab results as part of my medical decision-making.   EKG Interpretation None      MDM   Final diagnoses:  Heart palpitations  PVC's (premature ventricular contractions)    Patient here from urgent care for evaluation of palpitations. Prehospital EKG reviewed that demonstrates a sinus rhythm with multiple PVCs. During her stay in the department her symptoms improved. She did continue to have occasional PVCs on monitor. Presentation is not consistent with ACS, CHF, PE. Discussed outpatient cardiology follow-up for PVCs and palpitations. Home care and return precautions were discussed.    Quintella Reichert, MD 03/24/16 [phone]

## 2016-03-24 NOTE — Discharge Instructions (Signed)
Monitor Holter (Holter Monitoring) El monitor Holter es un pequeo dispositivo que se [place] para detectar el ritmo cardaco anormal. Se lo sujeta a la ropa y, [place] cables, se lo conecta a discos planos Therapist, music (electrodos) que se Therapist, music. Se lleva puesto de manera ininterrumpida durante 24 a 48horas. INSTRUCCIONES PARA EL CUIDADO EN EL HOGAR  Lleve puesto el monitor Holter en todo momento, incluso mientras hace ejercicio y duerme, durante el tiempo que el mdico se lo haya indicado.  Asegrese de que el monitor Holter est bien sujeto a la ropa o cerca del cuerpo, como el mdico lo haya recomendado.  No moje el monitor ni los cables.  No no se aplique locin ni humectante para el cuerpo en el pecho.  Mantenga la piel seca.  Lleve un diario de las actividades cotidianas, por ejemplo, caminar y Optometrist las tareas diarias. Si siente que el ritmo cardaco es anormal o que el corazn aletea o se saltea latidos:  Registre lo que est haciendo cuando esto sucede.  Registre la hora del da a la que se presentan los sntomas.  Devuelva el monitor Holter como se lo haya indicado el mdico.  Concurra a todas las visitas de control como se lo haya indicado el mdico. Esto es importante. SOLICITE ATENCIN MDICA DE INMEDIATO SI:  Se siente mareado o se desmaya.  Tiene dificultad para respirar.  Tiene dolor en el pecho, la parte superior del brazo o la [place].  Tiene Higher education careers adviser y la piel est plida, fra o hmeda.  Siente que la frecuencia cardaca no es la habitual o es anormal.   Esta informacin no tiene Marine scientist el consejo del mdico. Asegrese de hacerle al mdico cualquier pregunta que tenga.   Document Released: 09/08/2009 Document Revised: 12/02/2014 Elsevier Interactive Patient Education 2016 [place] (Palpitations) Es la sensacin de sentir que el latido cardaco es irregular o es ms rpido que lo normal. Se siente  como un aleteo o que falta un latido. Generalmente no es un problema grave. Sin embargo, en algunos casos podra ser necesario hacer ms estudios diagnsticos. CAUSAS  Las causas de las palpitaciones pueden ser:  Georgiann Hahn.  El consumo de cafena u otros estimulantes, como pastillas para Horticulturist, commercial o bebidas energizantes.  Alcohol.  Situaciones de estrs y [place].  La actividad fsica extenuante.  Fatiga.  Algunos medicamentos.  Enfermedad cardaca, especialmente si tiene antecedentes de ritmo cardaco irregular (arritmia), como fibrilacin auricular, aleteo auricular o taquicardia supraventricular.  El uso incorrecto de un marcapasos o Estate agent. DIAGNSTICO  Para hallar la causa de las palpitaciones, el mdico le har una historia clnica y un examen fsico. El mdico tambin puede hacerle un estudio llamado electrocardiograma (ECG) ambulatorio. El ECG registra el patrn de los latidos cardacos durante un perodo de 24horas. Tambin pueden hacerle otros estudios, por ejemplo:  Ecocardiograma transtorcico (ETT). Durante IT trainer, se usan ondas sonoras para evaluar cmo fluye la sangre por el corazn.  Ecocardiograma transesofgico (ETE).  Monitoreo cardaco. Este estudio permite que el mdico controle la frecuencia y el ritmo cardaco en tiempo real.  Monitor Holter. Es un dispositivo porttil que [employer] latidos cardacos y [place] a Retail buyer las arritmias cardacas. Le permite al [employer] registrar la actividad [place], si es necesario.  Pruebas de estrs por ejercicio o por medicamentos que aceleran los latidos cardacos. [place] palpitaciones depende de la causa y puede variar mucho. En la [employer]  no se requiere Community education officer, Nurse, children's y [employer] sntomas. Otras causas, como la fibrilacin auricular, el aleteo auricular o la taquicardia supraventricular generalmente requieren  Lexicographer. INSTRUCCIONES PARA EL CUIDADO EN EL HOGAR   Evite:  Bebidas que contengan cafena como el caf, el t, los refrescos, las pastillas para Horticulturist, commercial y las bebidas energizantes.  Chocolate.  Alcohol.  Si fuma, abandone el hbito.  Reduzca los niveles de estrs y [place]. Algunas cosas que pueden ayudarlo a relajarse son:  Un mtodo para controlar el cuerpo con la mente, por ejemplo, controlar los latidos (biorregulacin).  El yoga.  La meditacin.  La actividad fsica como natacin, trote o caminatas.  Descanse y duerma lo suficiente. SOLICITE ATENCIN MDICA SI:   Contina con latidos cardacos rpidos o irregulares durante ms de 24 horas.  Las [employer] suceden con ms frecuencia. SOLICITE ATENCIN MDICA DE INMEDIATO SI:  Siente falta de aire o dolor en el pecho.  Sufre un dolor intenso de [place].  Se siente mareado o se desmaya. ASEGRESE DE QUE:  Comprende estas instrucciones.  Controlar su afeccin.  Recibir ayuda de inmediato si no mejora o si empeora.   Esta informacin no tiene Marine scientist el consejo del mdico. Asegrese de hacerle al mdico cualquier pregunta que tenga.   Document Released: 08/21/2005 Document Revised: 11/16/2013 Elsevier Interactive Patient Education [employer].

## 2016-03-24 NOTE — ED Notes (Signed)
Report given to [facility] Eduard Clos).

## 2016-03-24 NOTE — ED Notes (Signed)
Pt returns from xray placed back on monitoring.

## 2016-03-24 NOTE — ED Notes (Signed)
From urgent care; intermittent chest pain and a "funny feeling" in her chest that lasts just a minute or two and then goes away with rest. EKG done at [facility], showed many PVC's. Given 324mg  ASA and transported to ED.

## 2016-03-24 NOTE — ED Notes (Signed)
Dr. Ralene Bathe at bedside; states EKG from UC is in the system and new one not needed unless she experiences pain.

## 2016-03-24 NOTE — ED Notes (Signed)
Reports intermittent left chest pain today with sensation of heart beating hard.  Had elevated HR (103 per pt) and elevated BP @ [facility] earlier today.  C/O feeling occasional SOB.  HR irregular with palpation of pulse.

## 2016-03-24 NOTE — ED Provider Notes (Signed)
HPI  SUBJECTIVE:  Carla [name] is a 45 y.o. female who presents with intermittent left-sided chest pain that she describes as pressure lasting minutes starting this morning. She reports palpitations, shortness of breath with it.  symptoms are worse with walking, better with rest. She has not tried anything for this. No radiation to her neck, arm, or through to her back. No diaphoresis, nausea. no positional component. No fevers. No coughing, wheezing, other shortness of breath. No abdominal pain. no presyncope, syncope. No leg pain, swelling, hemoptysis, exogenous estrogen, surgery in past 4 weeks, recent immobilization. She takes aspirin 325 dailyas she is status post stroke. She states her last dose of aspirin was at 6 PM last night. She has never had symptoms like this before. Past medical history of stroke, hypercholesterolemia, sickle cell anemia, collagen vascular disease. negative for hyperthyroidism, PE, DVT, smoking, MI, diabetes, hypertension, cancer. Family history negative for MI. LMP: 3/11. PMD: Dr. Benjiman Core  Obtained all history through the language line.  Past Medical History  Diagnosis Date  . Stroke ([place])     09/2015  . Hyperlipidemia     Past Surgical History  Procedure Laterality Date  . Tee without cardioversion N/A 10/13/2015    Procedure: TRANSESOPHAGEAL ECHOCARDIOGRAM (TEE);  Surgeon: Lelon Perla, MD;  Location: [facility] ENDOSCOPY;  Service: Cardiovascular;  Laterality: N/A;  . Right arm surgery      Family History  Problem Relation Age of Onset  . Asthma Neg Hx   . Cancer Neg Hx   . Diabetes Neg Hx   . Hyperlipidemia Neg Hx   . Heart failure Neg Hx   . Hypertension Neg Hx   . Migraines Neg Hx   . Rashes / Skin problems Neg Hx   . Seizures Neg Hx   . Stroke Neg Hx   . Thyroid disease Neg Hx   . Leukemia Mother     Social History  Substance Use Topics  . Smoking status: Never Smoker   . Smokeless tobacco: Never Used  . Alcohol Use: No    No  current facility-administered medications for this encounter.  Current outpatient prescriptions:  .  aspirin 325 MG tablet, Take 1 tablet (325 mg total) by mouth daily., Disp: 30 tablet, Rfl: 2 .  pravastatin (PRAVACHOL) 40 MG tablet, Take 1 tablet (40 mg total) by mouth daily., Disp: 30 tablet, Rfl: 2 .  cyclobenzaprine (FLEXERIL) 5 MG tablet, Take 0.5 tablets (2.5 mg total) by mouth 3 (three) times daily. (Patient not taking: Reported on 03/11/2016), Disp: 30 tablet, Rfl: 1 .  Multiple Vitamin (MULTIVITAMIN WITH MINERALS) TABS tablet, Take 1 tablet by mouth daily., Disp: , Rfl:  .  [DISCONTINUED] atorvastatin (LIPITOR) 40 MG tablet, Take 1 tablet (40 mg total) by mouth daily at 6 PM., Disp: 30 tablet, Rfl: 2  Allergies  Allergen Reactions  . Tramadol Nausea Only     ROS  As noted in HPI.   Physical Exam  BP 138/96 mmHg  Pulse 74  Temp(Src) 98.1 F (36.7 C) (Oral)  SpO2 99%  LMP 02/28/2016 (Approximate)  Constitutional: Well developed, well nourished, no acute distress Eyes: PERRL, EOMI, conjunctiva normal bilaterally HENT: Normocephalic, atraumatic,mucus membranes moist Respiratory: Clear to auscultation bilaterally, no rales, no wheezing, no rhonchi Cardiovascular: Irregular,  no murmurs, no gallops, no rubs GI: Soft, nondistended, normal bowel sounds, nontender, no rebound, no guarding Back: no CVAT skin: No rash, skin intact Musculoskeletal: calves symmetric, nontender, No edema, no deformities Neurologic: Alert & oriented x 3,  CN II-XII grossly intact, no motor deficits, sensation grossly intact Psychiatric: Speech and behavior appropriate   ED Course   Medications - No data to display  Orders Placed This Encounter  Procedures  . EKG 12-Lead    Standing Status: Standing     Number of Occurrences: 1     Standing Expiration Date:   . ED EKG    Standing Status: Standing     Number of Occurrences: 1     Standing Expiration Date:     Order Specific Question:   Reason for Exam    Answer:  Chest Pain   No results found for this or any previous visit (from the past 24 hour(s)). No results found.  ED Clinical Impression  Chest pain, unspecified chest pain type  Dyspnea   ED Assessment/Plan  EKG: Sinus tachycardia, rate 103, quadgiminy. Normal axis, normal intervals. No hypertrophy. No ST elevation. PVCs are new compared to previous EKG from 2016.  Patient has no ischemic changes on her current EKG, however the PVCs are new. Her symptoms are concerning for ACS with the risk factor of hypercholesterolemia. Less likely PE, PNA. Doubt dissection.   Patient took her own aspirin 325 mg while in the department. Her vitals are acceptable ransferring to the ED via EMS for further workup. she was placed on a cardiac monitor and IV fluids were started.  Using the language line, explained rationale for transfer to the ED via EMS. Patient agrees with plan.  *This clinic note was created using Dragon dictation software. Therefore, there may be occasional mistakes despite careful proofreading.  ?  Melynda Ripple, MD 03/24/16 [phone]

## 2016-03-25 ENCOUNTER — Ambulatory Visit (INDEPENDENT_AMBULATORY_CARE_PROVIDER_SITE_OTHER): Payer: Medicaid Other | Admitting: Physician Assistant

## 2016-03-25 ENCOUNTER — Encounter: Payer: Self-pay | Admitting: Physician Assistant

## 2016-03-25 VITALS — BP 120/80 | HR 92 | Ht 63.0 in | Wt 114.0 lb

## 2016-03-25 DIAGNOSIS — I493 Ventricular premature depolarization: Secondary | ICD-10-CM | POA: Insufficient documentation

## 2016-03-25 DIAGNOSIS — E785 Hyperlipidemia, unspecified: Secondary | ICD-10-CM | POA: Diagnosis not present

## 2016-03-25 DIAGNOSIS — R002 Palpitations: Secondary | ICD-10-CM

## 2016-03-25 MED ORDER — METOPROLOL TARTRATE 25 MG PO TABS: 12.5000 mg | ORAL_TABLET | Freq: Two times a day (BID) | ORAL | Status: DC

## 2016-03-25 NOTE — Patient Instructions (Signed)
Your physician recommends that you schedule a follow-up appointment in: 1 Month with Dr Oval Linsey  Your physician has recommended that you wear a 24 hour holter monitor this will be put on at our [facility]. Holter monitors are medical devices that record the heart's electrical activity. Doctors most often use these monitors to diagnose arrhythmias. Arrhythmias are problems with the speed or rhythm of the heartbeat. The monitor is a small, portable device. You can wear one while you do your normal daily activities. This is usually used to diagnose what is causing palpitations/syncope (passing out).  Your physician has recommended you make the following change in your medication: START Metoprolol 12.5 mg twice a day

## 2016-03-25 NOTE — Progress Notes (Signed)
Patient ID: Carla [name], female   DOB: [birth year], 44 y.o.   MRN: [id number]    Date:  03/25/2016   ID:  Carla [name], DOB September 14, 1971, MRN [id number]  PCP:  Concepcion Living, NP  Primary Cardiologist:  New. Will assign to Dr. Duke Salvia  Chief Complaint  Patient presents with  . Follow-up    Shortness of breath, then heart starts pounding, occ dizziness     History of Present Illness: Carla [name] is a 45 y.o. female history of stroke November 2016 and hyperlipidemia. She cleans houses for living. She has no known family history of coronary disease or heart problems in general. She had a 2-D echocardiogram November 2016, during her stroke workup, which revealed normal ejection fraction and grade 1 diastolic dysfunction and mild aortic valve regurgitation. Mild LVH.  She was seen in the emergency room yesterday for regular heartbeat/palpitations. She was noted to have frequent PVCs.  She presents today for follow-up of her ER visit yesterday. she reports that yesterday after church her heart started beating rapidly and 11:30. Says it was hard to breathe at that time. She had some nausea afterwards.  She denies any dizziness as well as vomiting, fever, chest pain, shortness of breath, orthopnea, dizziness, PND, cough, congestion, abdominal pain, hematochezia, melena, lower extremity edema, claudication.  Her troponin was negative CO2 was a little bit low.chest x-ray was negative for anything acute.   Wt Readings from Last 3 Encounters:  03/25/16 114 lb (51.71 kg)  03/11/16 115 lb (52.164 kg)  02/19/16 120 lb (54.432 kg)     Past Medical History  Diagnosis Date  . Stroke ([facility])     09/2015  . Hyperlipidemia     Current Outpatient Prescriptions  Medication Sig Dispense Refill  . aspirin 325 MG tablet Take 1 tablet (325 mg total) by mouth daily. 30 tablet 2  . Multiple Vitamin (MULTIVITAMIN WITH MINERALS) TABS tablet Take 1 tablet by mouth daily.    . pravastatin (PRAVACHOL) 40 MG tablet Take  1 tablet (40 mg total) by mouth daily. 30 tablet 2  . metoprolol tartrate (LOPRESSOR) 25 MG tablet Take 0.5 tablets (12.5 mg total) by mouth 2 (two) times daily. 30 tablet 6  . [DISCONTINUED] atorvastatin (LIPITOR) 40 MG tablet Take 1 tablet (40 mg total) by mouth daily at 6 PM. 30 tablet 2   No current facility-administered medications for this visit.   Family History  Problem Relation Age of Onset  . Asthma Neg Hx   . Cancer Neg Hx   . Diabetes Neg Hx   . Hyperlipidemia Neg Hx   . Heart failure Neg Hx   . Hypertension Neg Hx   . Migraines Neg Hx   . Rashes / Skin problems Neg Hx   . Seizures Neg Hx   . Stroke Neg Hx   . Thyroid disease Neg Hx   . Leukemia Mother      Allergies:    Allergies  Allergen Reactions  . Tramadol Nausea Only    Social History:  The patient  reports that she has never smoked. She has never used smokeless tobacco. She reports that she does not drink alcohol or use illicit drugs.   Family history:   Family History  Problem Relation Age of Onset  . Asthma Neg Hx   . Cancer Neg Hx   . Diabetes Neg Hx   . Hyperlipidemia Neg Hx   . Heart failure Neg Hx   . Hypertension Neg Hx   . Migraines  Neg Hx   . Rashes / Skin problems Neg Hx   . Seizures Neg Hx   . Stroke Neg Hx   . Thyroid disease Neg Hx   . Leukemia Mother     ROS:  Please see the history of present illness.  All other systems reviewed and negative.   PHYSICAL EXAM: VS:  BP 120/80 mmHg  Pulse 92  Ht 5\' 3"  (1.6 m)  Wt 114 lb (51.71 kg)  BMI 20.20 kg/m2  LMP 02/28/2016 (Approximate) Well nourished, well developed, in no acute distress HEENT: Pupils are equal round react to light accommodation extraocular movements are intact.  Neck: no JVDNo cervical lymphadenopathy. Cardiac: Regular rate and rhythm without murmurs rubs or gallops. Lungs:  clear to auscultation bilaterally, no wheezing, rhonchi or rales Abd: soft, nontender, positive bowel sounds all quadrants, no  hepatosplenomegaly Ext: no lower extremity edema.  2+ radial and dorsalis pedis pulses. Skin: warm and dry Neuro:  Grossly normal  EKG from April 30 in the emergency room:  Normal sinus rhythm with frequent PVCs    ASSESSMENT AND PLAN:  Problem List Items Addressed This Visit    Premature ventricular contractions   Relevant Medications   metoprolol tartrate (LOPRESSOR) 25 MG tablet   Hyperlipidemia   Relevant Medications   metoprolol tartrate (LOPRESSOR) 25 MG tablet    Other Visit Diagnoses    Palpitation    -  Primary    Relevant Orders    Holter monitor - 24 hour      Patient's a 45 year old female with no prior cardiac history who did have a stroke November 2016. She presents for follow-up from an emergency room visit yesterday at which time she had been complaining of palpitations and rapid heartbeat. She was found to have frequent PVCs on her EKG but otherwise normal sinus rhythm with no ischemic changes. Chest x-ray was negative for anything acute. Troponin was negative. We'll start 12.5 mg Lopressor twice daily and do a 48-hour Holter monitor. Follow-up with Dr. Duke Salvia in 1 month

## 2016-03-28 ENCOUNTER — Ambulatory Visit: Payer: Medicaid Other | Admitting: Family Medicine

## 2016-04-02 ENCOUNTER — Ambulatory Visit (INDEPENDENT_AMBULATORY_CARE_PROVIDER_SITE_OTHER): Payer: Medicaid Other

## 2016-04-02 DIAGNOSIS — R002 Palpitations: Secondary | ICD-10-CM

## 2016-04-06 ENCOUNTER — Encounter (HOSPITAL_COMMUNITY): Payer: Self-pay | Admitting: Emergency Medicine

## 2016-04-06 ENCOUNTER — Ambulatory Visit (HOSPITAL_COMMUNITY)
Admission: EM | Admit: 2016-04-06 | Discharge: 2016-04-06 | Disposition: A | Discharge status: Home / Self Care | Payer: Medicaid Other | Attending: Emergency Medicine | Admitting: Emergency Medicine

## 2016-04-06 DIAGNOSIS — E785 Hyperlipidemia, unspecified: Secondary | ICD-10-CM | POA: Insufficient documentation

## 2016-04-06 DIAGNOSIS — Z79899 Other long term (current) drug therapy: Secondary | ICD-10-CM | POA: Diagnosis not present

## 2016-04-06 DIAGNOSIS — R51 Headache: Secondary | ICD-10-CM | POA: Insufficient documentation

## 2016-04-06 DIAGNOSIS — R5383 Other fatigue: Secondary | ICD-10-CM | POA: Insufficient documentation

## 2016-04-06 DIAGNOSIS — Z8673 Personal history of transient ischemic attack (TIA), and cerebral infarction without residual deficits: Secondary | ICD-10-CM | POA: Insufficient documentation

## 2016-04-06 DIAGNOSIS — M6281 Muscle weakness (generalized): Secondary | ICD-10-CM | POA: Diagnosis not present

## 2016-04-06 DIAGNOSIS — R634 Abnormal weight loss: Secondary | ICD-10-CM | POA: Insufficient documentation

## 2016-04-06 DIAGNOSIS — R52 Pain, unspecified: Secondary | ICD-10-CM | POA: Diagnosis not present

## 2016-04-06 DIAGNOSIS — Z7982 Long term (current) use of aspirin: Secondary | ICD-10-CM | POA: Insufficient documentation

## 2016-04-06 DIAGNOSIS — R519 Headache, unspecified: Secondary | ICD-10-CM

## 2016-04-06 LAB — CBC WITH DIFFERENTIAL/PLATELET
BASOS ABS: 0 10*3/uL (ref 0.0–0.1)
Basophils Relative: 0 %
EOS ABS: 0 10*3/uL (ref 0.0–0.7)
EOS PCT: 0 %
HCT: 41.7 % (ref 36.0–46.0)
HEMOGLOBIN: 13.4 g/dL (ref 12.0–15.0)
LYMPHS ABS: 2.5 10*3/uL (ref 0.7–4.0)
LYMPHS PCT: 19 %
MCH: 29.1 pg (ref 26.0–34.0)
MCHC: 32.1 g/dL (ref 30.0–36.0)
MCV: 90.5 fL (ref 78.0–100.0)
Monocytes Absolute: 1.1 10*3/uL — ABNORMAL HIGH (ref 0.1–1.0)
Monocytes Relative: 8 %
NEUTROS PCT: 73 %
Neutro Abs: 9.2 10*3/uL — ABNORMAL HIGH (ref 1.7–7.7)
PLATELETS: 376 10*3/uL (ref 150–400)
RBC: 4.61 MIL/uL (ref 3.87–5.11)
RDW: 13.3 % (ref 11.5–15.5)
WBC: 12.8 10*3/uL — AB (ref 4.0–10.5)

## 2016-04-06 LAB — COMPREHENSIVE METABOLIC PANEL
ALK PHOS: 63 U/L (ref 38–126)
ALT: 22 U/L (ref 14–54)
ANION GAP: 9 (ref 5–15)
AST: 21 U/L (ref 15–41)
Albumin: 4.1 g/dL (ref 3.5–5.0)
BUN: 6 mg/dL (ref 6–20)
CHLORIDE: 102 mmol/L (ref 101–111)
CO2: 29 mmol/L (ref 22–32)
Calcium: 9.9 mg/dL (ref 8.9–10.3)
Creatinine, Ser: 0.72 mg/dL (ref 0.44–1.00)
GFR calc Af Amer: >60 mL/min (ref >60)
GFR calc non Af Amer: >60 mL/min (ref >60)
GLUCOSE: 111 mg/dL — AB (ref 65–99)
POTASSIUM: 3.8 mmol/L (ref 3.5–5.1)
SODIUM: 140 mmol/L (ref 135–145)
TOTAL PROTEIN: 7.1 g/dL (ref 6.5–8.1)
Total Bilirubin: 1 mg/dL (ref 0.3–1.2)

## 2016-04-06 LAB — CK: Total CK: 82 U/L (ref 38–234)

## 2016-04-06 MED ORDER — ZOLPIDEM TARTRATE 5 MG PO TABS: 5.0000 mg | ORAL_TABLET | Freq: Every evening | ORAL | Status: DC | PRN

## 2016-04-06 NOTE — ED Notes (Signed)
The patient presented to the [facility] with a complaint of generalized body aches and a headache x 2 days. The patient stated that she was just started on Atorvastatin 10 mg and feels that this could be the cause.

## 2016-04-06 NOTE — Discharge Instructions (Signed)
The muscle soreness and fatigue is likely coming from the medication change. Please take a 1/2 tablet once a day of the atorvastatin for 2 weeks. After that, go back to one tablet daily. Make sure you keep your appointments for the mammogram and colonoscopy. Use the Ambien at bedtime as needed to help you sleep. Your blood pressure is a little elevated today. This is likely due to anxiety and poor sleep. We will call you with the results of your blood work in 2-3 days. Follow-up as needed.  El dolor muscular y la fatiga es probable que proviene del cambio de Conservation officer, nature. Por favor tome una tableta de 1/2 una vez al da de la atorvastatina durante 2 semanas. Despus de eso, vuelva a una tableta diariamente. Asegrese de [employer] sus citas para la mamografa y la colonoscopia. Utilice el Ambien a la hora de [employer] segn sea necesario para ayudarle a dormir. Su presin arterial es un poco elevada hoy. Esto es probablemente debido a la ansiedad y el sueo pobre. Le llamaremos con los resultados de su anlisis de [place] en 2-3 das. Seguimiento segn sea necesario.

## 2016-04-06 NOTE — ED Provider Notes (Signed)
CSN: [id number]     Arrival date & time 04/06/16  1446 History   First MD Initiated Contact with Patient 04/06/16 1527     Chief Complaint  Patient presents with  . Headache  . Generalized Body Aches   (Consider location/radiation/quality/duration/timing/severity/associated sxs/prior Treatment) HPI  She is a 45 year old woman here for evaluation of body aches and fatigue. She states since a medication change on Thursday she has been having muscle soreness, fatigue, and headaches. She was changed from Pravachol to atorvastatin right before her symptoms started. She denies any chest pain or dizziness. The muscle soreness and headaches come and go. No vision changes, difficulty with speech or swallowing, or focal weakness.  She also reports weight loss over the last several months. Her brother was recently diagnosed with colon cancer. She does have a mammogram and colonoscopy scheduled. She also reports poor sleep at night. She states she wakes up in the middle of the night and cannot fall back asleep due to anxiety.  Past Medical History  Diagnosis Date  . Stroke ([place])     09/2015  . Hyperlipidemia    Past Surgical History  Procedure Laterality Date  . Tee without cardioversion N/A 10/13/2015    Procedure: TRANSESOPHAGEAL ECHOCARDIOGRAM (TEE);  Surgeon: Lelon Perla, MD;  Location: [facility] ENDOSCOPY;  Service: Cardiovascular;  Laterality: N/A;  . Right arm surgery     Family History  Problem Relation Age of Onset  . Asthma Neg Hx   . Cancer Neg Hx   . Diabetes Neg Hx   . Hyperlipidemia Neg Hx   . Heart failure Neg Hx   . Hypertension Neg Hx   . Migraines Neg Hx   . Rashes / Skin problems Neg Hx   . Seizures Neg Hx   . Stroke Neg Hx   . Thyroid disease Neg Hx   . Leukemia Mother    Social History  Substance Use Topics  . Smoking status: Never Smoker   . Smokeless tobacco: Never Used  . Alcohol Use: No   OB History    No data available     Review of Systems As in history  of present illness Allergies  Tramadol  Home Medications   Prior to Admission medications   Medication Sig Start Date End Date Taking? Authorizing Provider  aspirin 325 MG tablet Take 1 tablet (325 mg total) by mouth daily. 03/11/16  Yes Micheline Chapman, NP  atorvastatin (LIPITOR) 10 MG tablet Take 10 mg by mouth daily.   Yes Historical Provider, MD  metoprolol tartrate (LOPRESSOR) 25 MG tablet Take 0.5 tablets (12.5 mg total) by mouth 2 (two) times daily. 03/25/16  Yes Brett Canales, PA-C  Multiple Vitamin (MULTIVITAMIN WITH MINERALS) TABS tablet Take 1 tablet by mouth daily.   Yes Historical Provider, MD  zolpidem (AMBIEN) 5 MG tablet Take 1 tablet (5 mg total) by mouth at bedtime as needed for sleep. 04/06/16   Melony Overly, MD   Meds Ordered and Administered this Visit  Medications - No data to display  BP 152/97 mmHg  Pulse 84  Temp(Src) 98.8 F (37.1 C) (Oral)  Resp 16  SpO2 99%  LMP 02/28/2016 (Approximate) No data found.   Physical Exam  Constitutional: She is oriented to person, place, and time. She appears well-developed and well-nourished. No distress.  Eyes: EOM are normal. Pupils are equal, round, and reactive to light.  Neck: Neck supple.  Cardiovascular: Normal rate, regular rhythm and normal heart sounds.  No murmur heard. Pulmonary/Chest: Effort normal and breath sounds normal. No respiratory distress. She has no wheezes. She has no rales.  Musculoskeletal: She exhibits no tenderness.  Lymphadenopathy:    She has no cervical adenopathy.  Neurological: She is alert and oriented to person, place, and time. No cranial nerve deficit. She exhibits normal muscle tone. Coordination normal.    ED Course  Procedures (including critical care time)  Labs Review Labs Reviewed  CBC WITH DIFFERENTIAL/PLATELET  COMPREHENSIVE METABOLIC PANEL  CK    Imaging Review No results found.   MDM   1. Muscle weakness   2. Other fatigue   3. Weight loss   4. Headache,  unspecified headache type    I suspect at least some of her symptoms are coming from the medication change to atorvastatin. I recommended that she take half a tablet daily for 2 weeks, then increase to 1 tablet daily. I've encouraged her to keep her appointments for mammogram and colonoscopy. I did provide a prescription for Ambien, #15 tablets, to use as needed for sleep. Follow-up as needed.    Melony Overly, MD 04/06/16 [phone]

## 2016-04-08 ENCOUNTER — Ambulatory Visit (INDEPENDENT_AMBULATORY_CARE_PROVIDER_SITE_OTHER): Payer: Medicaid Other | Admitting: Family Medicine

## 2016-04-08 ENCOUNTER — Encounter: Payer: Self-pay | Admitting: Family Medicine

## 2016-04-08 ENCOUNTER — Other Ambulatory Visit (HOSPITAL_COMMUNITY)
Admission: RE | Admit: 2016-04-08 | Discharge: 2016-04-08 | Disposition: A | Discharge status: Home / Self Care | Payer: Medicaid Other | Source: Ambulatory Visit | Attending: Family Medicine | Admitting: Family Medicine

## 2016-04-08 VITALS — BP 130/77 | HR 89 | Temp 98.2°F | Ht 63.0 in | Wt 110.0 lb

## 2016-04-08 DIAGNOSIS — Z01419 Encounter for gynecological examination (general) (routine) without abnormal findings: Secondary | ICD-10-CM | POA: Diagnosis not present

## 2016-04-08 DIAGNOSIS — Z01411 Encounter for gynecological examination (general) (routine) with abnormal findings: Secondary | ICD-10-CM | POA: Diagnosis present

## 2016-04-08 DIAGNOSIS — Z1151 Encounter for screening for human papillomavirus (HPV): Secondary | ICD-10-CM | POA: Insufficient documentation

## 2016-04-08 NOTE — Progress Notes (Signed)
Patient ID: Carla [name], female   DOB: 05-19-1971, 45 y.o.   MRN: [id number]   Carla [name], is a 45 y.o. female  [id number]  [id number]  DOB - 13-Jun-1971  CC:  Chief Complaint  Patient presents with  . pap smear    concerned that she feels nervous some       HPI: Carla [name] is a 45 y.o. female here to for PAP smear and Tdap. She was seen within last month and was asked to return for this. Has been a long time since she had a PAP. She is unsure when she had a Tdap last.ROS and PE deferred other than pelvic exam.  Allergies  Allergen Reactions  . Tramadol Nausea Only   Past Medical History  Diagnosis Date  . Stroke ([facility])     09/2015  . Hyperlipidemia    Current Outpatient Prescriptions on File Prior to Visit  Medication Sig Dispense Refill  . aspirin 325 MG tablet Take 1 tablet (325 mg total) by mouth daily. 30 tablet 2  . atorvastatin (LIPITOR) 10 MG tablet Take 10 mg by mouth daily.    . metoprolol tartrate (LOPRESSOR) 25 MG tablet Take 0.5 tablets (12.5 mg total) by mouth 2 (two) times daily. 30 tablet 6  . Multiple Vitamin (MULTIVITAMIN WITH MINERALS) TABS tablet Take 1 tablet by mouth daily.    Marland Kitchen zolpidem (AMBIEN) 5 MG tablet Take 1 tablet (5 mg total) by mouth at bedtime as needed for sleep. (Patient not taking: Reported on 04/08/2016) 15 tablet 0  . [DISCONTINUED] pravastatin (PRAVACHOL) 40 MG tablet Take 1 tablet (40 mg total) by mouth daily. 30 tablet 2   No current facility-administered medications on file prior to visit.   Family History  Problem Relation Age of Onset  . Asthma Neg Hx   . Cancer Neg Hx   . Diabetes Neg Hx   . Hyperlipidemia Neg Hx   . Heart failure Neg Hx   . Hypertension Neg Hx   . Migraines Neg Hx   . Rashes / Skin problems Neg Hx   . Seizures Neg Hx   . Stroke Neg Hx   . Thyroid disease Neg Hx   . Leukemia Mother    Social History   Social History  . Marital Status: Single    Spouse Name: N/A  . Number of Children: N/A   . Years of Education: N/A   Occupational History  . Not on file.   Social History Main Topics  . Smoking status: Never Smoker   . Smokeless tobacco: Never Used  . Alcohol Use: No  . Drug Use: No  . Sexual Activity: Yes    Birth Control/ Protection: None   Other Topics Concern  . Not on file   Social History Narrative   PE:  Alert, oriented, appropriate, skin warm and dry. There is mild inflammation of the cervix, which is extremely retroverted. There is no unusual discharge. There is no CMT or adnexal tenderness.  Objective:   Filed Vitals:   04/08/16 1037  BP: 130/77  Pulse: 89  Temp: 98.2 F (36.8 C)     Lab Results  Component Value Date   WBC 12.8* 04/06/2016   HGB 13.4 04/06/2016   HCT 41.7 04/06/2016   MCV 90.5 04/06/2016   PLT 376 04/06/2016   Lab Results  Component Value Date   CREATININE 0.72 04/06/2016   BUN 6 04/06/2016   NA 140 04/06/2016   K 3.8 04/06/2016   CL 102  04/06/2016   CO2 29 04/06/2016    Lab Results  Component Value Date   HGBA1C 5.6 10/12/2015   Lipid Panel     Component Value Date/Time   CHOL 207* 10/12/2015 0638   TRIG 89 10/12/2015 0638   HDL 50 10/12/2015 0638   CHOLHDL 4.1 10/12/2015 0638   VLDL 18 10/12/2015 0638   LDLCALC 139* 10/12/2015 0638       Assessment and plan:   1. Encounter for routine gynecological examination, only  - Cytology - PAP [place]   No Follow-up on file.  The patient was given clear instructions to go to ER or return to medical center if symptoms don't improve, worsen or new problems develop. The patient verbalized understanding.    Henrietta Hoover FNP  04/08/2016, 10:52 AM

## 2016-04-09 ENCOUNTER — Emergency Department (HOSPITAL_COMMUNITY): Payer: Medicaid Other

## 2016-04-09 ENCOUNTER — Telehealth (HOSPITAL_COMMUNITY): Payer: Self-pay | Admitting: Emergency Medicine

## 2016-04-09 ENCOUNTER — Emergency Department (HOSPITAL_COMMUNITY)
Admission: EM | Admit: 2016-04-09 | Discharge: 2016-04-10 | Disposition: A | Discharge status: Home / Self Care | Payer: Medicaid Other | Attending: Emergency Medicine | Admitting: Emergency Medicine

## 2016-04-09 ENCOUNTER — Encounter (HOSPITAL_COMMUNITY): Payer: Self-pay | Admitting: *Deleted

## 2016-04-09 DIAGNOSIS — Z7982 Long term (current) use of aspirin: Secondary | ICD-10-CM | POA: Insufficient documentation

## 2016-04-09 DIAGNOSIS — R519 Headache, unspecified: Secondary | ICD-10-CM

## 2016-04-09 DIAGNOSIS — Z8673 Personal history of transient ischemic attack (TIA), and cerebral infarction without residual deficits: Secondary | ICD-10-CM | POA: Diagnosis not present

## 2016-04-09 DIAGNOSIS — E785 Hyperlipidemia, unspecified: Secondary | ICD-10-CM | POA: Diagnosis not present

## 2016-04-09 DIAGNOSIS — G8929 Other chronic pain: Secondary | ICD-10-CM | POA: Diagnosis not present

## 2016-04-09 DIAGNOSIS — Z79899 Other long term (current) drug therapy: Secondary | ICD-10-CM | POA: Diagnosis not present

## 2016-04-09 DIAGNOSIS — R51 Headache: Secondary | ICD-10-CM | POA: Insufficient documentation

## 2016-04-09 LAB — CBC WITH DIFFERENTIAL/PLATELET
BASOS PCT: 0 %
Basophils Absolute: 0 10*3/uL (ref 0.0–0.1)
Eosinophils Absolute: 0.1 10*3/uL (ref 0.0–0.7)
Eosinophils Relative: 1 %
HEMATOCRIT: 39.2 % (ref 36.0–46.0)
Hemoglobin: 12.8 g/dL (ref 12.0–15.0)
LYMPHS PCT: 29 %
Lymphs Abs: 3 10*3/uL (ref 0.7–4.0)
MCH: 29.2 pg (ref 26.0–34.0)
MCHC: 32.7 g/dL (ref 30.0–36.0)
MCV: 89.5 fL (ref 78.0–100.0)
MONO ABS: 1 10*3/uL (ref 0.1–1.0)
MONOS PCT: 9 %
NEUTROS ABS: 6.4 10*3/uL (ref 1.7–7.7)
Neutrophils Relative %: 61 %
Platelets: 321 10*3/uL (ref 150–400)
RBC: 4.38 MIL/uL (ref 3.87–5.11)
RDW: 13.1 % (ref 11.5–15.5)
WBC: 10.5 10*3/uL (ref 4.0–10.5)

## 2016-04-09 NOTE — ED Notes (Signed)
Called pt and notified of recent lab results from visit 5/13 Pt ID'd properly... Reports feeling better and sx have subsided somewhat  Per Dr. Bridgett Larsson,  Clinical staff, please let patient know her blood work is normal  Adv pt if sx are not getting better to return  Pt verb understanding

## 2016-04-09 NOTE — ED Provider Notes (Signed)
CSN: [id number]     Arrival date & time 04/09/16  1757 History  By signing my name below, I, Altamease Oiler, attest that this documentation has been prepared under the direction and in the presence of Quintella Reichert, MD. Electronically Signed: Altamease Oiler, ED Scribe. 04/09/2016. 11:28 PM  Chief Complaint  Patient presents with  . Headache    The history is provided by the patient. No language interpreter was used.   Carla [name] is a 45 y.o. female with PMHx of stroke and HLD who presents to the Emergency Department complaining of a increased, intermittent, 10/10 in severity, right sided headache with onset 2 days ago. The episodes of pain last for approximately 20 minutes at a time.  Pt states that she has a chronic right sided headache since a stroke last November but the pain has been worse at night for the last 2 days. The pain is at the right side of the head and face. She associates the increase in pain with starting Lipitor 5 days ago and Lopressor 1 weeks ago. She also states that since starting the new medication. Pt denies fever, confusion,  vision change, vomiting, numbness, weakness, chest pain, body aches.   Past Medical History  Diagnosis Date  . Stroke ([place])     09/2015  . Hyperlipidemia    Past Surgical History  Procedure Laterality Date  . Tee without cardioversion N/A 10/13/2015    Procedure: TRANSESOPHAGEAL ECHOCARDIOGRAM (TEE);  Surgeon: Lelon Perla, MD;  Location: [facility] ENDOSCOPY;  Service: Cardiovascular;  Laterality: N/A;  . Right arm surgery     Family History  Problem Relation Age of Onset  . Asthma Neg Hx   . Cancer Neg Hx   . Diabetes Neg Hx   . Hyperlipidemia Neg Hx   . Heart failure Neg Hx   . Hypertension Neg Hx   . Migraines Neg Hx   . Rashes / Skin problems Neg Hx   . Seizures Neg Hx   . Stroke Neg Hx   . Thyroid disease Neg Hx   . Leukemia Mother    Social History  Substance Use Topics  . Smoking status: Never Smoker   . Smokeless  tobacco: Never Used  . Alcohol Use: No   OB History    No data available     Review of Systems  All other systems reviewed and are negative.  Allergies  Tramadol  Home Medications   Prior to Admission medications   Medication Sig Start Date End Date Taking? Authorizing Provider  aspirin 325 MG tablet Take 1 tablet (325 mg total) by mouth daily. 03/11/16  Yes Micheline Chapman, NP  atorvastatin (LIPITOR) 10 MG tablet Take 10 mg by mouth daily.   Yes Historical Provider, MD  metoprolol tartrate (LOPRESSOR) 25 MG tablet Take 0.5 tablets (12.5 mg total) by mouth 2 (two) times daily. 03/25/16  Yes Brett Canales, PA-C  Multiple Vitamin (MULTIVITAMIN WITH MINERALS) TABS tablet Take 1 tablet by mouth daily.    Historical Provider, MD  zolpidem (AMBIEN) 5 MG tablet Take 1 tablet (5 mg total) by mouth at bedtime as needed for sleep. Patient not taking: Reported on 04/08/2016 04/06/16   Melony Overly, MD   BP 136/89 mmHg  Pulse 86  Temp(Src) 98.4 F (36.9 C) (Oral)  Resp 12  Ht 5\' 4"  (1.626 m)  Wt 113 lb 7 oz (51.455 kg)  BMI 19.46 kg/m2  SpO2 100%  LMP 02/28/2016 (Approximate) Physical Exam  Constitutional: She  is oriented to person, place, and time. She appears well-developed and well-nourished.  HENT:  Head: Normocephalic and atraumatic.  TMs clear bilaterally, no facial tenderness to palpation.   Eyes: EOM are normal. Pupils are equal, round, and reactive to light.  Neck: Neck supple.  Cardiovascular: Normal rate and regular rhythm.   No murmur heard. Pulmonary/Chest: Effort normal and breath sounds normal. No respiratory distress.  Abdominal: Soft. There is no tenderness. There is no rebound and no guarding.  Musculoskeletal: She exhibits no edema or tenderness.  Neurological: She is alert and oriented to person, place, and time. No cranial nerve deficit.  5/5 strength in all 4 extremities   Skin: Skin is warm and dry.  Psychiatric: She has a normal mood and affect. Her behavior  is normal.  Nursing note and vitals reviewed.   ED Course  Procedures (including critical care time) DIAGNOSTIC STUDIES: Oxygen Saturation is 100% on RA,  normal by my interpretation.    COORDINATION OF CARE: 11:26 PM Discussed treatment plan which includes lab work and CT head without contrast with pt at bedside and pt agreed to plan.  Labs Review Labs Reviewed  COMPREHENSIVE METABOLIC PANEL - Abnormal; Notable for the following:    Glucose, Bld 107 (*)    Total Protein 6.4 (*)    All other components within normal limits  CBC WITH DIFFERENTIAL/PLATELET    Imaging Review Ct Head Wo Contrast  04/10/2016  CLINICAL DATA:  45 year old female with right-sided headache for 2 days. EXAM: CT HEAD WITHOUT CONTRAST TECHNIQUE: Contiguous axial images were obtained from the base of the skull through the vertex without intravenous contrast. COMPARISON:  CT dated 01/01/2015 and MRI dated 10/11/2015 FINDINGS: The ventricles and the sulci are appropriate in size for the patient's age. There is no intracranial hemorrhage. No midline shift or mass effect identified. The gray-white matter differentiation is preserved. The visualized paranasal sinuses and mastoid air cells are well aerated. The calvarium is intact. IMPRESSION: No acute intracranial pathology. Electronically Signed   By: Anner Crete M.D.   On: 04/10/2016 01:12   I have personally reviewed and evaluated these images and lab results as part of my medical decision-making.   EKG Interpretation None      MDM   Final diagnoses:  Nonintractable episodic headache, unspecified headache type   Pt here for evaluation of episodic headache, resolved on evaluation in the ED.  Presentation is not c/w SAH, CVA, meningitis, temporal arteritis.  Discussed with patient home care with tylenol/ibuprofen, outpatient follow up, return precautions.     I personally performed the services described in this documentation, which was scribed in my  presence. The recorded information has been reviewed and is accurate.    Quintella Reichert, MD 04/10/16 [phone]

## 2016-04-09 NOTE — Discharge Instructions (Signed)
Dolor de cabeza general sin causa (General Headache Without Cause) El dolor de cabeza es un dolor o malestar que se siente en la zona de la cabeza o del cuello. Puede no tener una causa especfica. Hay muchas causas y tipos de dolores de [place]. Los dolores de cabeza ms comunes son los siguientes:  Cefalea tensional.  Cefaleas migraosas.  Cefalea en brotes.  Cefaleas diarias crnicas. INSTRUCCIONES PARA EL CUIDADO EN EL HOGAR  Controle su afeccin para ver si hay cambios. Siga estos pasos para Aeronautical engineer afeccin: Control del [employer] medicamentos de venta libre y los recetados solamente como se lo haya indicado el mdico.  Cuando sienta dolor de cabeza acustese en un cuarto oscuro y tranquilo.  Si se lo indican, aplique hielo sobre la cabeza y la zona del cuello:  Ponga el hielo en una bolsa plstica.  Coloque una toalla entre la piel y la bolsa de hielo.  Coloque el hielo durante 67minutos, 2 a 3veces por Training and development officer.  Utilice una almohadilla trmica o tome una ducha con agua caliente para aplicar calor en la cabeza y la zona del cuello como se lo haya indicado el [place] luces tenues si le [employer] luces brillantes o sus dolores de cabeza empeoran. Comida y bebida  Mantenga un horario para las comidas.  Limite el consumo de bebidas alcohlicas.  Consuma menos cantidad de cafena o deje de tomarla. Instrucciones generales  Concurra a todas las visitas de control como se lo haya indicado el mdico. Esto es importante.  Lleve un diario de los dolores de cabeza para Neurosurgeon qu factores pueden desencadenarlos. Por ejemplo, escriba los siguientes datos:  Lo que usted come y Buyer, retail.  Cunto tiempo duerme.  Algn cambio en su dieta o en los medicamentos.  Pruebe algunas tcnicas de relajacin, como los [place].  Limite el estrs.  Sintese con la espalda recta y no tense los msculos.  No consuma productos que contengan tabaco, incluidos  cigarrillos, tabaco de Higher education careers adviser o cigarrillos electrnicos. Si necesita ayuda para dejar de fumar, consulte al mdico.  Haga actividad fsica habitualmente como se lo haya indicado el mdico.  Tenga un horario fijo para dormir. Duerma entre 7 y 9horas o la cantidad de horas que le haya recomendado el mdico. SOLICITE ATENCIN MDICA SI:   Los medicamentos no Dealer los sntomas.  Tiene un dolor de cabeza que es diferente del dolor de cabeza habitual.  Tiene nuseas o vmitos.  Tiene fiebre. SOLICITE ATENCIN MDICA DE INMEDIATO SI:   El dolor se hace cada vez ms intenso.  Ha vomitado repetidas veces.  Presenta rigidez en el cuello.  Sufre prdida de la visin.  Tiene problemas para hablar.  Siente dolor en el ojo o en el odo.  Presenta debilidad muscular o prdida del control muscular.  Pierde el equilibrio o tiene problemas para Writer.  Sufre mareos o se desmaya.  Se siente confundido.   Esta informacin no tiene Marine scientist el consejo del mdico. Asegrese de hacerle al mdico cualquier pregunta que tenga.   Document Released: 08/21/2005 Document Revised: 08/02/2015 Elsevier Interactive Patient Education [employer].

## 2016-04-09 NOTE — ED Notes (Signed)
Pt in c/o HA onset x 1 mth, pt reports worsening pain x 3 days, pt reports worse pain in the afternoon, pt c/o denies vision changes, denies slurred speech, pt ambulatory, no facial droop present, A&O x4, pt reports worsening HA with new medication for her heart, pt has Metoprolol, Atorvastatin, & 325mg  EC ASA with her in ED

## 2016-04-10 LAB — COMPREHENSIVE METABOLIC PANEL
ALBUMIN: 3.6 g/dL (ref 3.5–5.0)
ALK PHOS: 61 U/L (ref 38–126)
ALT: 17 U/L (ref 14–54)
ANION GAP: 10 (ref 5–15)
AST: 18 U/L (ref 15–41)
BILIRUBIN TOTAL: 0.7 mg/dL (ref 0.3–1.2)
BUN: 10 mg/dL (ref 6–20)
CALCIUM: 9.3 mg/dL (ref 8.9–10.3)
CO2: 25 mmol/L (ref 22–32)
Chloride: 104 mmol/L (ref 101–111)
Creatinine, Ser: 0.8 mg/dL (ref 0.44–1.00)
GFR calc non Af Amer: >60 mL/min (ref >60)
GLUCOSE: 107 mg/dL — AB (ref 65–99)
Potassium: 3.7 mmol/L (ref 3.5–5.1)
SODIUM: 139 mmol/L (ref 135–145)
Total Protein: 6.4 g/dL — ABNORMAL LOW (ref 6.5–8.1)

## 2016-04-10 LAB — CYTOLOGY - PAP

## 2016-04-10 MED ORDER — ACETAMINOPHEN 325 MG PO TABS
ORAL_TABLET | ORAL | Status: AC
  Filled 2016-04-10: qty 2

## 2016-04-10 NOTE — ED Notes (Signed)
Pt discharged/ See downtime note

## 2016-04-16 ENCOUNTER — Telehealth: Payer: Self-pay | Admitting: Cardiovascular Disease

## 2016-04-16 NOTE — Telephone Encounter (Signed)
Pt upcoming appt confirmed. Pt has had headache but unsure if related to a SE of her meds or not. She was recommeneded to take tylenol - i noted this was OK as long as no concerns for liver issues. (noted dx of hemangioma of liver) Pt states recent workup by PCP and he noted no problems w/ her liver & normal hepatic fxn. Pt had hepatitis testing performing fall of last year which indicated neg for Hep A, B, C.  Pt thinks she is having some problems w/ metoprolol, fatigue and "feeling hot" in afternoon, but notes she has been missing doses and not taking consistently. Advised for now to take as instructed. We addressed her dosing several times and she is aware to do a half tablet of the metoprolol twice daily (morning and evening). She is aware to call if new concerns in interim but to follow up as scheduled on 1st of June w/ Dr. Oval Linsey.

## 2016-04-16 NOTE — Telephone Encounter (Signed)
New Message  Pt having symptoms from her med-metoprolol requested to speak w/ rN- has appt 6/1 w/ Oval Linsey. Please call back and discuss.

## 2016-04-18 ENCOUNTER — Ambulatory Visit (HOSPITAL_COMMUNITY)
Admission: EM | Admit: 2016-04-18 | Discharge: 2016-04-18 | Disposition: A | Discharge status: Home / Self Care | Payer: Medicaid Other | Attending: Family Medicine | Admitting: Family Medicine

## 2016-04-18 ENCOUNTER — Encounter (HOSPITAL_COMMUNITY): Payer: Self-pay | Admitting: *Deleted

## 2016-04-18 DIAGNOSIS — F419 Anxiety disorder, unspecified: Secondary | ICD-10-CM

## 2016-04-18 DIAGNOSIS — M791 Myalgia, unspecified site: Secondary | ICD-10-CM

## 2016-04-18 NOTE — ED Notes (Signed)
Pt  Reports     Symptoms   Of  Back  And  Upper  Neck pain      With  Symptoms  X  3  Weeks      Pt reports   The  Pain is  intermittant        She  Reports  Episodes  Of  anxiety  /  Trembling  As  Well        Pt   Is    Awake  And  Alert and  Oriented   She  denys  Any  Chest pain or  Shortness  Of  breath

## 2016-04-18 NOTE — Discharge Instructions (Signed)
Dolor muscular - Adultos (Muscle Pain, Adult) Stop taking the  atorvastatin Las causas del dolor muscular (mialgia) pueden ser [place], entre ellas:  Uso excesivo del msculo o distensin muscular, en especial si la persona no est en buen estado fsico. Esta es la causa ms comn del dolor muscular.  Lesiones.  [place].  Virus, como el de la gripe.  Enfermedades infecciosas.  Fibromialgia, que es una afeccin crnica que causa dolor de [place], fatiga y dolor muscular con la palpacin.  Enfermedades autoinmunes, como el lupus.  Determinados medicamentos, como los inhibidores de la ECA y las estatinas. El dolor muscular puede ser leve o intenso. La mayora de las veces, el dolor dura solo un corto perodo y desaparece sin tratamiento. Para diagnosticar la causa del dolor muscular, el mdico le preguntar los antecedentes mdicos. Esto significa que le preguntar cundo comenz Physiological scientist y qu ha sucedido desde ese momento. Si el dolor muscular no comenz hace mucho tiempo, el mdico puede esperar antes de hacer diversas pruebas. Si el dolor muscular comenz hace mucho tiempo, el mdico puede decidir que es ms conveniente hacer pruebas de inmediato. Si el mdico cree que Physiological scientist puede estar causado por una enfermedad, es posible que necesite hacer pruebas adicionales para descartar determinadas afecciones.  El tratamiento del dolor muscular depende de la causa. El cuidado en el hogar a menudo es suficiente para Stage manager. El mdico tambin puede recetarle un medicamento antinflamatorio. INSTRUCCIONES PARA EL CUIDADO EN EL HOGAR Controle su afeccin para ver si hay cambios. Las siguientes indicaciones ayudarn a Chief Strategy Officer que pueda sentir:  [employer] medicamentos de venta libre o recetados solamente segn las indicaciones del mdico.  Aplique hielo en el msculo dolorido:  Ponga el hielo en una bolsa plstica.  Colquese una toalla entre  la piel y la bolsa de hielo.  Aplique el hielo de 3 a 4 veces por da durante 15 a 56minutos.  Puede alternar la colocacin de compresas calientes y fras en el msculo segn lo que le haya indicado el mdico.  Si el uso excesivo del msculo le est causando dolor muscular, disminuya las actividades hasta que el dolor desaparezca.  Recuerde que es normal sentir algo de dolor muscular despus de comenzar un programa de entrenamiento. Generalmente duelen aquellos msculos que no se utilizan con frecuencia.  Si usted no hace actividad fsica con frecuencia, los ejercicios deben ser suaves y regulares.  Para reducir el riesgo de dolor muscular, haga un precalentamiento antes de la actividad fsica.  No siga haciendo actividad fsica si el dolor es muy intenso. Este tipo de dolor podra indicar que se ha lesionado un msculo. SOLICITE ATENCIN MDICA SI:  El Marketing executive empeora y los medicamentos no surten [place].  Tiene dolor muscular que dura ms de 3das.  Tiene una erupcin cutnea o fiebre junto con el dolor muscular.  Tiene dolor muscular despus de una picadura de garrapata.  Tiene dolor muscular mientras hace actividad fsica, aunque est en buen estado fsico.  Tiene enrojecimiento, sensibilidad o hinchazn junto con el dolor muscular.  Tiene dolor muscular despus de comenzar un medicamento nuevo o de cambiar la dosis de un medicamento. SOLICITE ATENCIN MDICA DE INMEDIATO SI:  Tiene dificultad para respirar.  Presenta dificultad para tragar.  Tiene dolor muscular junto con rigidez en el cuello, fiebre y vmitos.  Tiene debilidad muscular intensa o no puede mover una parte del cuerpo. ASEGRESE DE QUE:   Comprende estas instrucciones.  Controlar su afeccin.  Recibir ayuda de inmediato si no mejora o si empeora.   Esta informacin no tiene Marine scientist el consejo del mdico. Asegrese de hacerle al mdico cualquier pregunta que tenga.   Document  Released: 02/18/2008 Document Revised: 12/02/2014 Elsevier Interactive Patient Education 2016 [place] de ansiedad generalizada (Generalized Anxiety Disorder) El trastorno de ansiedad generalizada es un trastorno mental. Interfiere en las funciones vitales, incluyendo las [place], el trabajo y la escuela.  Es diferente de la ansiedad normal que todas las personas experimentan en algn momento de su vida en respuesta a sucesos y Chief Executive Officer. En verdad, la ansiedad normal nos ayuda a prepararnos y Brewing technologist acontecimientos y actividades de la vida. La ansiedad normal desaparece despus de que el evento o la actividad ha finalizado.  El trastorno de ansiedad generalizada no est necesariamente relacionada con eventos o actividades especficas. Tambin causa un exceso de ansiedad en proporcin a sucesos o actividades especficas. En este trastorno la ansiedad es difcil de Chief Technology Officer. Los sntomas pueden variar de leves a muy graves. Las personas que sufren de trastorno de ansiedad generalizada pueden tener intensas olas de ansiedad con sntomas fsicos (ataques de pnico).  SNTOMAS  La ansiedad y la preocupacin asociada a este trastorno son difciles de Chief Technology Officer. Esta ansiedad y la preocupacin estn relacionados con muchos eventos de la vida y sus actividades y tambin ocurre durante ms [employer] que no ocurre, durante 6 meses o ms. Las personas que la sufren pueden tener tres o ms de los siguientes sntomas (uno o ms en los nios):   Customer service manager.  Dificultades de concentracin.   Irritabilidad.  Tensin muscular  Dificultad para dormirse o sueo poco satisfactorio. DIAGNSTICO  Se diagnostica a travs de una evaluacin realizada por el mdico. El mdico le har preguntas acerca de su estado de nimo, sntomas fsicos y sucesos de [place] vida. Le har preguntas sobre su historia clnica, el consumo de alcohol o drogas, incluyendo los  medicamentos recetados. [employer] un examen fsico e indicar anlisis de [place]. Ciertas enfermedades y el uso de determinadas sustancias pueden causar sntomas similares a este trastorno. Su mdico lo puede derivar a Teaching laboratory technician en salud mental para una evaluacin ms profunda.Belva Crome  Las terapias siguientes se utilizan en el tratamiento de este trastorno:   Medicamentos - Se recetan antidepresivos para el control diario a Barrister's clerk. Pueden indicarse tambin medicamentos para combatir la [place] graves, especialmente cuando ocurren ataques de pnico.   Terapia conversada (psicoterapia) Ciertos tipos de psicoterapia pueden ser tiles en el tratamiento del trastorno de ansiedad generalizada, proporcionando apoyo, educacin y Fish farm manager. Una forma de psicoterapia llamada terapia cognitivo-conductual puede ensearle formas saludables de pensar y Firefighter a los eventos y actividades de la vida diaria.  Tcnicasde manejo del estrs- Estas tcnicas incluyen el yoga, la meditacin y el ejercicio y pueden ser muy tiles cuando se practican con regularidad. Un especialista en salud mental puede ayudar a determinar qu tratamiento es mejor para usted. Algunas personas obtienen mejora con una terapia. Sin embargo, Producer, television/film/video requieren una combinacin de terapias.    Esta informacin no tiene Marine scientist el consejo del mdico. Asegrese de hacerle al mdico cualquier pregunta que tenga.   Document Released: 03/08/2013 Document Revised: 12/02/2014 Elsevier Interactive Patient Education [employer].

## 2016-04-18 NOTE — ED Provider Notes (Signed)
CSN: [id number]     Arrival date & time 04/18/16  1708 History   First MD Initiated Contact with Patient 04/18/16 1800     Chief Complaint  Patient presents with  . Back Pain   (Consider location/radiation/quality/duration/timing/severity/associated sxs/prior Treatment) HPI Comments: 45 year old Spanish [name] visits the urgent care for the fourth time in 3 weeks. One of those visits was to the emergency department. She has various complaints although the first one was that of a possible stroke. She was placed on atorvastatin, metoprolol and enteric-coated aspirin. Since she has been taking the atorvastatin she has had muscle aches and pains and feels bad in general. She would like to stop taking that medication and would like some advice on her medicines.  She is also complaining of pain in the left parathoracic musculature. No known injury. She states the pain is quite mild and is a little worse with some movements.   Past Medical History  Diagnosis Date  . Stroke ([place])     09/2015  . Hyperlipidemia    Past Surgical History  Procedure Laterality Date  . Tee without cardioversion N/A 10/13/2015    Procedure: TRANSESOPHAGEAL ECHOCARDIOGRAM (TEE);  Surgeon: Lelon Perla, MD;  Location: [facility] ENDOSCOPY;  Service: Cardiovascular;  Laterality: N/A;  . Right arm surgery     Family History  Problem Relation Age of Onset  . Asthma Neg Hx   . Cancer Neg Hx   . Diabetes Neg Hx   . Hyperlipidemia Neg Hx   . Heart failure Neg Hx   . Hypertension Neg Hx   . Migraines Neg Hx   . Rashes / Skin problems Neg Hx   . Seizures Neg Hx   . Stroke Neg Hx   . Thyroid disease Neg Hx   . Leukemia Mother    Social History  Substance Use Topics  . Smoking status: Never Smoker   . Smokeless tobacco: Never Used  . Alcohol Use: No   OB History    No data available     Review of Systems  Constitutional: Negative for fever, chills and activity change.  HENT: Negative.   Respiratory: Negative.   Negative for cough and shortness of breath.   Cardiovascular: Negative for chest pain, palpitations and leg swelling.  Gastrointestinal: Negative.   Genitourinary: Negative.   Musculoskeletal: Positive for myalgias and back pain.  Skin: Negative.   Neurological: Positive for weakness. Negative for syncope, facial asymmetry and speech difficulty.  Psychiatric/Behavioral: The patient is nervous/anxious.     Allergies  Tramadol  Home Medications   Prior to Admission medications   Medication Sig Start Date End Date Taking? Authorizing Provider  aspirin 325 MG tablet Take 1 tablet (325 mg total) by mouth daily. 03/11/16   Micheline Chapman, NP  atorvastatin (LIPITOR) 10 MG tablet Take 10 mg by mouth daily.    Historical Provider, MD  metoprolol tartrate (LOPRESSOR) 25 MG tablet Take 0.5 tablets (12.5 mg total) by mouth 2 (two) times daily. 03/25/16   Brett Canales, PA-C  Multiple Vitamin (MULTIVITAMIN WITH MINERALS) TABS tablet Take 1 tablet by mouth daily.    Historical Provider, MD  zolpidem (AMBIEN) 5 MG tablet Take 1 tablet (5 mg total) by mouth at bedtime as needed for sleep. Patient not taking: Reported on 04/08/2016 04/06/16   Melony Overly, MD   Meds Ordered and Administered this Visit  Medications - No data to display  BP 123/84 mmHg  Pulse 96  Temp(Src) 97.6 F (36.4  C) (Oral)  Resp 16  SpO2 99%  LMP 02/28/2016 (Approximate) No data found.   Physical Exam  Constitutional: She is oriented to person, place, and time. She appears well-developed and well-nourished. No distress.  HENT:  Head: Normocephalic and atraumatic.  Eyes: Conjunctivae and EOM are normal.  Neck: Normal range of motion. Neck supple.  Cardiovascular: Normal rate, regular rhythm and normal heart sounds.   Pulmonary/Chest: Effort normal and breath sounds normal. No respiratory distress. She has no wheezes. She has no rales.  Musculoskeletal: She exhibits no edema.  Minor tenderness to the left parathoracic  musculature.  Neurological: She is alert and oriented to person, place, and time. She exhibits normal muscle tone.  Skin: Skin is warm and dry.  Psychiatric: She has a normal mood and affect.  Nursing note and vitals reviewed.   ED Course  Procedures (including critical care time)  Labs Review Labs Reviewed - No data to display  Imaging Review No results found.   Visual Acuity Review  Right Eye Distance:   Left Eye Distance:   Bilateral Distance:    Right Eye Near:   Left Eye Near:    Bilateral Near:         MDM   1. Myalgia   2. Anxiety    Stop taking the  Atorvastatin Keep appointment with your PCP next week. Reassurance Patient has anxiety regarding her health condition and medications.    Janne Napoleon, NP 04/18/16 [phone]

## 2016-04-23 ENCOUNTER — Encounter (HOSPITAL_COMMUNITY): Payer: Self-pay | Admitting: *Deleted

## 2016-04-23 ENCOUNTER — Emergency Department (HOSPITAL_COMMUNITY)
Admission: EM | Admit: 2016-04-23 | Discharge: 2016-04-23 | Disposition: A | Discharge status: Home / Self Care | Payer: Medicaid Other | Attending: Emergency Medicine | Admitting: Emergency Medicine

## 2016-04-23 ENCOUNTER — Emergency Department (HOSPITAL_COMMUNITY): Payer: Medicaid Other

## 2016-04-23 DIAGNOSIS — I493 Ventricular premature depolarization: Secondary | ICD-10-CM | POA: Diagnosis not present

## 2016-04-23 DIAGNOSIS — Z79899 Other long term (current) drug therapy: Secondary | ICD-10-CM | POA: Insufficient documentation

## 2016-04-23 DIAGNOSIS — E785 Hyperlipidemia, unspecified: Secondary | ICD-10-CM | POA: Diagnosis not present

## 2016-04-23 DIAGNOSIS — Z8673 Personal history of transient ischemic attack (TIA), and cerebral infarction without residual deficits: Secondary | ICD-10-CM | POA: Insufficient documentation

## 2016-04-23 DIAGNOSIS — R55 Syncope and collapse: Secondary | ICD-10-CM

## 2016-04-23 DIAGNOSIS — M545 Low back pain: Secondary | ICD-10-CM | POA: Insufficient documentation

## 2016-04-23 DIAGNOSIS — Z7982 Long term (current) use of aspirin: Secondary | ICD-10-CM | POA: Diagnosis not present

## 2016-04-23 LAB — BASIC METABOLIC PANEL
Anion gap: 7 (ref 5–15)
BUN: 7 mg/dL (ref 6–20)
CALCIUM: 9.3 mg/dL (ref 8.9–10.3)
CO2: 27 mmol/L (ref 22–32)
Chloride: 106 mmol/L (ref 101–111)
Creatinine, Ser: 0.71 mg/dL (ref 0.44–1.00)
GFR calc Af Amer: >60 mL/min (ref >60)
GLUCOSE: 120 mg/dL — AB (ref 65–99)
POTASSIUM: 4 mmol/L (ref 3.5–5.1)
SODIUM: 140 mmol/L (ref 135–145)

## 2016-04-23 LAB — URINALYSIS, ROUTINE W REFLEX MICROSCOPIC
Bilirubin Urine: NEGATIVE
Glucose, UA: NEGATIVE mg/dL
Ketones, ur: NEGATIVE mg/dL
Nitrite: NEGATIVE
Protein, ur: NEGATIVE mg/dL
SPECIFIC GRAVITY, URINE: 1.012 (ref 1.005–1.030)
pH: 8 (ref 5.0–8.0)

## 2016-04-23 LAB — CBC
HEMATOCRIT: 37.8 % (ref 36.0–46.0)
Hemoglobin: 12 g/dL (ref 12.0–15.0)
MCH: 28.5 pg (ref 26.0–34.0)
MCHC: 31.7 g/dL (ref 30.0–36.0)
MCV: 89.8 fL (ref 78.0–100.0)
Platelets: 313 10*3/uL (ref 150–400)
RBC: 4.21 MIL/uL (ref 3.87–5.11)
RDW: 13.4 % (ref 11.5–15.5)
WBC: 9.3 10*3/uL (ref 4.0–10.5)

## 2016-04-23 LAB — I-STAT TROPONIN, ED: Troponin i, poc: 0 ng/mL (ref 0.00–0.08)

## 2016-04-23 LAB — URINE MICROSCOPIC-ADD ON: Bacteria, UA: NONE SEEN

## 2016-04-23 LAB — CBG MONITORING, ED: GLUCOSE-CAPILLARY: 109 mg/dL — AB (ref 65–99)

## 2016-04-23 NOTE — ED Provider Notes (Signed)
CSN: [id number]     Arrival date & time 04/23/16  1306 History   First MD Initiated Contact with Patient 04/23/16 1658     Chief Complaint  Patient presents with  . Near Syncope    (Consider location/radiation/quality/duration/timing/severity/associated sxs/prior Treatment) Patient is a 45 y.o. female presenting with near-syncope. The history is provided by the patient and medical records. No language interpreter was used.  Near Syncope Associated symptoms include myalgias. Pertinent negatives include no abdominal pain, chest pain, congestion, coughing, fever, headaches, nausea, rash or vomiting.  Carla [name] is a 45 y.o. female  with a PMH of prior stroke without deficits in 09/2015, HLD, PVC's on metoprolol who presents to the Emergency Department complaining of heart palpitations that began this morning. Patient states that she was working when her face and hands got pale and her heart was beating quickly. She felt like she was going to pass out. She sat down, ate some chocolate and in approximately 15 minutes she felt much improved. She also admits to myalgias, especially of her right back. She is on atorvastatin. Seen at urgent care on 5/25 for muscle aches where she was told to d/c statin until she sees cards, however she was very worried that she may have a stroke if she d/c's this medication, so she has still been taking it regularly. She is followed by cardiology and has an appointment on June 1st. Denies fever, chest pain, sob, abdominal pain, n/v, visual changes, dizziness. Only complaint at present is myalgias.    Past Medical History  Diagnosis Date  . Stroke ([place])     09/2015  . Hyperlipidemia    Past Surgical History  Procedure Laterality Date  . Tee without cardioversion N/A 10/13/2015    Procedure: TRANSESOPHAGEAL ECHOCARDIOGRAM (TEE);  Surgeon: Lelon Perla, MD;  Location: [facility] ENDOSCOPY;  Service: Cardiovascular;  Laterality: N/A;  . Right arm surgery     Family  History  Problem Relation Age of Onset  . Asthma Neg Hx   . Cancer Neg Hx   . Diabetes Neg Hx   . Hyperlipidemia Neg Hx   . Heart failure Neg Hx   . Hypertension Neg Hx   . Migraines Neg Hx   . Rashes / Skin problems Neg Hx   . Seizures Neg Hx   . Stroke Neg Hx   . Thyroid disease Neg Hx   . Leukemia Mother    Social History  Substance Use Topics  . Smoking status: Never Smoker   . Smokeless tobacco: Never Used  . Alcohol Use: No   OB History    No data available     Review of Systems  Constitutional: Negative for fever.  HENT: Negative for congestion.   Eyes: Negative for visual disturbance.  Respiratory: Negative for cough and shortness of breath.   Cardiovascular: Positive for palpitations and near-syncope. Negative for chest pain and leg swelling.  Gastrointestinal: Negative for nausea, vomiting and abdominal pain.  Genitourinary: Negative for dysuria.  Musculoskeletal: Positive for myalgias.  Skin: Negative for rash.  Neurological: Negative for dizziness and headaches.      Allergies  Tramadol  Home Medications   Prior to Admission medications   Medication Sig Start Date End Date Taking? Authorizing Provider  aspirin 325 MG tablet Take 1 tablet (325 mg total) by mouth daily. 03/11/16  Yes Micheline Chapman, NP  atorvastatin (LIPITOR) 10 MG tablet Take 10 mg by mouth daily.   Yes Historical Provider, MD  metoprolol tartrate (LOPRESSOR)  25 MG tablet Take 0.5 tablets (12.5 mg total) by mouth 2 (two) times daily. 03/25/16  Yes Brett Canales, PA-C  zolpidem (AMBIEN) 5 MG tablet Take 1 tablet (5 mg total) by mouth at bedtime as needed for sleep. Patient not taking: Reported on 04/08/2016 04/06/16   Melony Overly, MD   BP 135/85 mmHg  Pulse 64  Temp(Src) 98.5 F (36.9 C) (Oral)  Resp 16  Ht 5\' 5"  (1.651 m)  Wt 49.896 kg  BMI 18.31 kg/m2  SpO2 100%  LMP 04/23/2016 Physical Exam  Constitutional: She is oriented to person, place, and time. She appears  well-developed and well-nourished. No distress.  Alert and in no acute distress  HENT:  Head: Normocephalic and atraumatic.  Cardiovascular: Normal rate, regular rhythm, normal heart sounds and intact distal pulses.  Exam reveals no gallop and no friction rub.   No murmur heard. Pulmonary/Chest: Effort normal and breath sounds normal. No respiratory distress. She has no wheezes. She has no rales. She exhibits no tenderness.  Abdominal: Soft. She exhibits no distension. There is no tenderness.  Musculoskeletal: Normal range of motion. She exhibits no edema.  Diffuse tenderness to left back, no midline tenderness. No overlying skin changes.   Neurological: She is alert and oriented to person, place, and time. No cranial nerve deficit.  Skin: Skin is warm and dry. She is not diaphoretic.  Nursing note and vitals reviewed.   ED Course  Procedures (including critical care time) Labs Review Labs Reviewed  BASIC METABOLIC PANEL - Abnormal; Notable for the following:    Glucose, Bld 120 (*)    All other components within normal limits  URINALYSIS, ROUTINE W REFLEX MICROSCOPIC (NOT AT [facility]) - Abnormal; Notable for the following:    APPearance CLOUDY (*)    Hgb urine dipstick LARGE (*)    Leukocytes, UA TRACE (*)    All other components within normal limits  URINE MICROSCOPIC-ADD ON - Abnormal; Notable for the following:    Squamous Epithelial / LPF 0-5 (*)    All other components within normal limits  CBG MONITORING, ED - Abnormal; Notable for the following:    Glucose-Capillary 109 (*)    All other components within normal limits  CBC  I-STAT TROPOININ, ED    Imaging Review Dg Chest 2 View  04/23/2016  CLINICAL DATA:  Left-sided chest pain and near syncope EXAM: CHEST  2 VIEW COMPARISON:  March 24, 2016 FINDINGS: There is no edema or consolidation. The heart size and pulmonary vascularity are normal. No adenopathy. There is evidence of old trauma involving the proximal right humerus  with localized osteoporosis in this area, stable. No pneumothorax. IMPRESSION: No edema or consolidation. Electronically Signed   By: Lowella Grip III M.D.   On: 04/23/2016 18:40   I have personally reviewed and evaluated these images and lab results as part of my medical decision-making.   EKG Interpretation   Date/Time:  Tuesday Apr 23 2016 13:27:24 EDT Ventricular Rate:  97 PR Interval:  126 QRS Duration: 76 QT Interval:  332 QTC Calculation: 421 R Axis:   62 Text Interpretation:  Sinus rhythm with occasional Premature ventricular  complexes Septal infarct , age undetermined Abnormal ECG Confirmed by  ZAMMIT  MD, Broadus John [id number]) on 04/23/2016 7:01:22 PM      MDM   Final diagnoses:  Near syncope  Premature ventricular contractions   Amybeth Eifler presents with episode of flushing, palpitations which improved over 15 minutes with rest and chocolate. She  has history of PVC's and is followed by cardiology.   Labs: Trop negative, CBC, BMP, CBG, UA reviewed and reassuring.  Imaging: CXR with no acute findings.  EKG reviewed with attending.  Patient re-evaluated and has had no chest pain or recurrent symptoms while in my care. Exam/history/labs not c/w ACS, CHF, PE.   A&P: Continue taking metoprolol twice daily as directed. Quit taking atorvastatin as this is a common cause of myalgias. Patient has a scheduled follow-up appointment with cardiology, Dr. Oval Linsey on 6/01 (2 days). This is an appropriate timeframe for outpatient follow-up. Patient informed to let cardiologist know about myalgias and d/c of statin. Let them know of event today as well. Strict return precautions were discussed with patient. The importance of follow-up care was discussed. All questions answered. This plan was included in discharge summary information for her to take to her cardiologist.  Patient discussed with Dr. Roderic Palau who agrees with treatment plan.   [facility] Peityn Payton, PA-C 04/23/16 1928  Milton Ferguson, MD 04/23/16 [phone]

## 2016-04-23 NOTE — Discharge Instructions (Signed)
Continue to take your metoprolol twice daily as directed. Quit taking you atorvastatin until you see your cardiologist on 6/01 - let the cardiologist know that you were experiencing muscle aches (myalgias) with this medication.  Increase hydration.  Return to ER for chest pain, difficulty breathing, new or worsening symptoms, any additional concerns.

## 2016-04-23 NOTE — ED Notes (Addendum)
Pt reports being at work & her color changed to white & she felt faint, reports some relief with eating chocolate, pt denies n/v/d, pt reports heart palpitations, pt states, "It is the medication I am taking." pt reports polyuria, denies hematuria, pt A&O x4, pt states, "It is my medication that makes me feel this way. It is the Atorvastatin making me feel this way."

## 2016-04-25 ENCOUNTER — Encounter: Payer: Self-pay | Admitting: Cardiovascular Disease

## 2016-04-25 ENCOUNTER — Ambulatory Visit (INDEPENDENT_AMBULATORY_CARE_PROVIDER_SITE_OTHER): Payer: Medicaid Other | Admitting: Cardiovascular Disease

## 2016-04-25 VITALS — BP 130/82 | HR 98 | Ht 65.0 in | Wt 109.0 lb

## 2016-04-25 DIAGNOSIS — I639 Cerebral infarction, unspecified: Secondary | ICD-10-CM | POA: Diagnosis not present

## 2016-04-25 DIAGNOSIS — I493 Ventricular premature depolarization: Secondary | ICD-10-CM | POA: Insufficient documentation

## 2016-04-25 DIAGNOSIS — E785 Hyperlipidemia, unspecified: Secondary | ICD-10-CM | POA: Diagnosis not present

## 2016-04-25 HISTORY — DX: Ventricular premature depolarization: I49.3

## 2016-04-25 MED ORDER — NEBIVOLOL HCL 5 MG PO TABS: 5.0000 mg | ORAL_TABLET | Freq: Every day | ORAL | Status: DC

## 2016-04-25 NOTE — Progress Notes (Signed)
Cardiology Office Note   Date:  04/25/2016   ID:  Carla [name], DOB 09-28-1971, MRN [id number]  PCP:  Concepcion Living, NP  Cardiologist:   Chilton Si, MD   Chief Complaint  Patient presents with  . Follow-up    1 month  pt c/o anxiety and panic attacks--fatigue, loss of apetite, occasionally feels her body shaking and pain in face (feels like the muscles in her face are thumping)--all of the Sx. started after taking medicine     History of Present Illness: Carla [name] is a 45 y.o. female with hyperlipidemia and prior stroke who presents for palpitations.  Carla [name] was seen in the emergency department on 03/24/16 with palpitations. She was noted to have frequent PVCs. Troponin was negative and chest x-ray was unremarkable.  She followed up with Jones Skene, PA-C, the following day, where she was started on metoprolol for PVCs. She also had a 24-hour Holter that revealed 15% PVCs.  Carla [name] had an echo at the time of her stroke in 11 2016 that revealed LVEF 55-60% with grade 1 diastolic dysfunction. There is mild aortic regurgitation.  Since starting the metoprolol she feels like her whole body has changed.  She has no appetite, sleeps all the time and has diffuse myalgias.  She also feels very panicky and shaky.  She does have fewer palpitations.  Since starting the medication she only notes 2-3 episodes of palpitations.  Each episodes lasts 7-8 minutes.  Prior to starting metoprolol it occurred daily, multiple times per day.  Carla [name] stopped taking atorvastatin 1 week ago and continues to have myalgias.     Past Medical History  Diagnosis Date  . Stroke (HCC)     09/2015  . Hyperlipidemia   . 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 Tee without cardioversion N/A 10/13/2015    Procedure: TRANSESOPHAGEAL ECHOCARDIOGRAM (TEE);  Surgeon: Lewayne Bunting, MD;  Location: [facility] ENDOSCOPY;  Service: Cardiovascular;  Laterality: N/A;  .  Right arm surgery       Current Outpatient Prescriptions  Medication Sig Dispense Refill  . aspirin 325 MG tablet Take 1 tablet (325 mg total) by mouth daily. 30 tablet 2  . zolpidem (AMBIEN) 5 MG tablet Take 1 tablet (5 mg total) by mouth at bedtime as needed for sleep. 15 tablet 0  . nebivolol (BYSTOLIC) 5 MG tablet Take 1 tablet (5 mg total) by mouth daily. 30 tablet 5  . [DISCONTINUED] pravastatin (PRAVACHOL) 40 MG tablet Take 1 tablet (40 mg total) by mouth daily. 30 tablet 2   No current facility-administered medications for this visit.    Allergies:   Tramadol    Social History:  The patient  reports that she has never smoked. She has never used smokeless tobacco. She reports that she does not drink alcohol or use illicit drugs.   Family History:  The patient's family history includes Leukemia in her mother. There is no history of Asthma, Cancer, Diabetes, Hyperlipidemia, Heart failure, Hypertension, Migraines, Rashes / Skin problems, Seizures, Stroke, or Thyroid disease.    ROS:  Please see the history of present illness.   Otherwise, review of systems are positive for weight loss.   All other systems are reviewed and negative.    PHYSICAL EXAM: VS:  BP 130/82 mmHg  Pulse 98  Ht 5\' 5"  (1.651 m)  Wt 109 lb (49.442 kg)  BMI 18.14 kg/m2  LMP 04/23/2016 , BMI Body mass  index is 18.14 kg/(m^2). GENERAL:  Well appearing HEENT:  Pupils equal round and reactive, fundi not visualized, oral mucosa unremarkable NECK:  No jugular venous distention, waveform within normal limits, carotid upstroke brisk and symmetric, no bruits, no thyromegaly LYMPHATICS:  No cervical adenopathy LUNGS:  Clear to auscultation bilaterally HEART:  Mostly regular with occasional ectopy.   PMI not displaced or sustained,S1 and S2 within normal limits, no S3, no S4, no clicks, no rubs, no murmurs ABD:  Flat, positive bowel sounds normal in frequency in pitch, no bruits, no rebound, no guarding, no midline  pulsatile mass, no hepatomegaly, no splenomegaly EXT:  2 plus pulses throughout, no edema, no cyanosis no clubbing SKIN:  No rashes no nodules NEURO:  Cranial nerves II through XII grossly intact, motor grossly intact throughout PSYCH:  Cognitively intact, oriented to person place and time    EKG:  EKG is not ordered today. The ekg ordered 04/24/16 demonstrates  sinus rhythm. Rate 97 bpm. Frequent PVCs.  Echo 10/12/15: Study Conclusions  - Left ventricle: The cavity size was normal. Wall thickness was  increased in a pattern of mild LVH. Systolic function was normal.  The estimated ejection fraction was in the range of 55% to 60%.  Although no diagnostic regional wall motion abnormality was  identified, this possibility cannot be completely excluded on the  basis of this study. Doppler parameters are consistent with  abnormal left ventricular relaxation (grade 1 diastolic  dysfunction). - Aortic valve: There was mild regurgitation.  TEE 10/13/15:  LVEF 55-60%.  Negative bubble study.  24 Hour Holter Monitor 04/02/16:  Quality: Fair. Baseline artifact. Predominant rhythm: sinus rhythm with frequent PVCs Average heart rate: 95bpm Max heart rate: 140 bpm Min heart rate: 72 bpm  Ventricular ectopics: 19,584  Percentage of ectopic beats: 15.6% Morphology: polymorphic with one dominant morphology   Recent Labs: 04/09/2016: ALT 17 04/23/2016: BUN 7; Creatinine, Ser 0.71; Hemoglobin 12.0; Platelets 313; Potassium 4.0; Sodium 140    Lipid Panel    Component Value Date/Time   CHOL 207* 10/12/2015 0638   TRIG 89 10/12/2015 0638   HDL 50 10/12/2015 0638   CHOLHDL 4.1 10/12/2015 0638   VLDL 18 10/12/2015 0638   LDLCALC 139* 10/12/2015 0638      Wt Readings from Last 3 Encounters:  04/25/16 109 lb (49.442 kg)  04/23/16 110 lb (49.896 kg)  04/09/16 113 lb 7 oz (51.455 kg)      ASSESSMENT AND PLAN:  # PVCs: Metoprolol seems to be helping.  However, she has many  symptoms since starting this medication. Therefore we will stop metoprolol. We will start nebivolol 5 mg daily instead.   Once we find a medication that she can tolerate we will repeat her 24-hour Holter to assess her PVC burden. If it is greater than 10% we will refer her to electrophysiology for consideration of ablation. She does not currently have any evidence of systolic dysfunction on her echo.   # Hyperlipidemia: Carla [name] has a history of stroke and was started on atorvastatin. Her cholesterol levels have not very elevated. This medication was stopped in the hopes it would improve her myalgias. I'm still suspicious that this is the main culprit of the myalgias, as metoprolol is very unlikely to cause the symptom. Given that we're starting another new medication we will not add a statin at this time. At her follow-up appointment we will consider adding low-dose rosuvastatin.  # Prior stroke: Continue aspirin 325mg  daily.  Consider restarting statin  at follow up.   Current medicines are reviewed at length with the patient today.  The patient does not have concerns regarding medicines.  The following changes have been made:  no change  Labs/ tests ordered today include:  No orders of the defined types were placed in this encounter.     Disposition:   FU with Danh Bayus C. Duke Salvia, MD, [facility] in 1 month.   This note was written with the assistance of speech recognition software.  Please excuse any transcriptional errors.  Signed, Ahna Konkle C. Duke Salvia, MD, [facility]  04/25/2016 6:10 PM    [facility]

## 2016-04-25 NOTE — Patient Instructions (Signed)
Medication Instructions:  STOP METOPROLOL   START BYSTOLIC 5 MG DAILY  Labwork: NONE  Testing/Procedures: NONE  Follow-Up: Your physician recommends that you schedule a follow-up appointment in: ABOUT 1 MONTH   If you need a refill on your cardiac medications before your next appointment, please call your pharmacy.

## 2016-04-26 ENCOUNTER — Telehealth: Payer: Self-pay | Admitting: *Deleted

## 2016-04-26 MED ORDER — PROPRANOLOL HCL 20 MG PO TABS: 20.0000 mg | ORAL_TABLET | Freq: Two times a day (BID) | ORAL | Status: DC

## 2016-04-26 NOTE — Telephone Encounter (Signed)
Insurance required PA for Bysotlic Patient must try and fail two of the preferred medications Per Dr Oval Linsey will try Propranolol 20 mg twice a day, patient has tried Metoprolol Dr Oval Linsey called patient and left message  RX sent to [facility]

## 2016-04-30 ENCOUNTER — Encounter (HOSPITAL_COMMUNITY): Payer: Self-pay | Admitting: Emergency Medicine

## 2016-04-30 ENCOUNTER — Ambulatory Visit (HOSPITAL_COMMUNITY)
Admission: EM | Admit: 2016-04-30 | Discharge: 2016-04-30 | Disposition: A | Discharge status: Home / Self Care | Payer: Medicaid Other | Attending: Emergency Medicine | Admitting: Emergency Medicine

## 2016-04-30 DIAGNOSIS — R251 Tremor, unspecified: Secondary | ICD-10-CM

## 2016-04-30 DIAGNOSIS — R634 Abnormal weight loss: Secondary | ICD-10-CM | POA: Diagnosis not present

## 2016-04-30 LAB — POCT I-STAT, CHEM 8
BUN: 8 mg/dL (ref 6–20)
CALCIUM ION: 1.22 mmol/L (ref 1.12–1.23)
CREATININE: 0.6 mg/dL (ref 0.44–1.00)
Chloride: 103 mmol/L (ref 101–111)
GLUCOSE: 101 mg/dL — AB (ref 65–99)
HCT: 41 % (ref 36.0–46.0)
Hemoglobin: 13.9 g/dL (ref 12.0–15.0)
Potassium: 4 mmol/L (ref 3.5–5.1)
SODIUM: 141 mmol/L (ref 135–145)
TCO2: 28 mmol/L (ref 0–100)

## 2016-04-30 MED ORDER — MEGESTROL ACETATE 20 MG PO TABS: 20.0000 mg | ORAL_TABLET | Freq: Every day | ORAL | Status: DC

## 2016-04-30 NOTE — ED Notes (Signed)
Pt states she feels shaky and weak all day every day.  She feels the worst in the morning where she states she is very pale. She has been suffering from panic attacks as well.

## 2016-04-30 NOTE — Discharge Instructions (Signed)
Your symptoms in the morning are coming from not eating enough. Please eat 5 small meals over the course of the day. I sent a prescription for Megace to your pharmacy. Take 1 pill a day. This will help with your appetite. If you start to feel worse after starting it, you can stop the medicine. Follow-up as needed.

## 2016-04-30 NOTE — ED Provider Notes (Signed)
CSN: [id number]     Arrival date & time 04/30/16  1304 History   First MD Initiated Contact with Patient 04/30/16 1416     Chief Complaint  Patient presents with  . Shaking  . Fatigue   (Consider location/radiation/quality/duration/timing/severity/associated sxs/prior Treatment) HPI  She is a 45 year old woman here for evaluation of shaking. She's been having a number of difficulties since being started on medication for frequent PVCs. She saw her cardiologist last week and her metoprolol was changed to bystolic. She states she had to pay [id number] for the bystolic.  There is a note in her chart that the cardiologists change this to propranolol, which her insurance will pay for.  I have discussed this with the patient with the assistance of an interpreter.  She states she does feel a little bit better with this new medicine. Her muscle aches are better.  She continues to have shaking and tremulousness in the mornings. This is sometimes associated with a paleness to her skin and feeling fatigued or lightheaded. She will also have some discomfort in her stomach and nausea.  She also describes feeling a shaking sensation in her chest.  She states she does note that she has problems with anxiety, and is not sure what is going on. She has also been losing weight. This is due to decreased appetite. She is also very concerned about what she can eat given her history of stroke.  Past Medical History  Diagnosis Date  . Stroke ([place])     09/2015  . Hyperlipidemia   . 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 Tee without cardioversion N/A 10/13/2015    Procedure: TRANSESOPHAGEAL ECHOCARDIOGRAM (TEE);  Surgeon: Lelon Perla, MD;  Location: [facility] ENDOSCOPY;  Service: Cardiovascular;  Laterality: N/A;  . Right arm surgery     Family History  Problem Relation Age of Onset  . Asthma Neg Hx   . Cancer Neg Hx   . Diabetes Neg Hx   . Hyperlipidemia Neg Hx   . Heart failure  Neg Hx   . Hypertension Neg Hx   . Migraines Neg Hx   . Rashes / Skin problems Neg Hx   . Seizures Neg Hx   . Stroke Neg Hx   . Thyroid disease Neg Hx   . Leukemia Mother    Social History  Substance Use Topics  . Smoking status: Never Smoker   . Smokeless tobacco: Never Used  . Alcohol Use: No   OB History    No data available     Review of Systems As in history of present illness Allergies  Atorvastatin and Tramadol  Home Medications   Prior to Admission medications   Medication Sig Start Date End Date Taking? Authorizing Provider  aspirin 325 MG tablet Take 1 tablet (325 mg total) by mouth daily. 03/11/16  Yes Micheline Chapman, NP  nebivolol (BYSTOLIC) 5 MG tablet Take 5 mg by mouth daily.   Yes Historical Provider, MD  megestrol (MEGACE) 20 MG tablet Take 1 tablet (20 mg total) by mouth daily. 04/30/16   Melony Overly, MD  propranolol (INDERAL) 20 MG tablet Take 1 tablet (20 mg total) by mouth 2 (two) times daily. 04/26/16   Skeet Latch, MD   Meds Ordered and Administered this Visit  Medications - No data to display  BP 129/65 mmHg  Pulse 70  Temp(Src) 98.6 F (37 C) (Oral)  Resp 18  SpO2 100%  LMP 04/23/2016 (  Exact Date) No data found.   Physical Exam  Constitutional: She is oriented to person, place, and time. She appears well-developed and well-nourished. No distress.  Neck: Neck supple.  Cardiovascular: Normal rate, regular rhythm and normal heart sounds.   No murmur heard. Pulmonary/Chest: Effort normal and breath sounds normal. No respiratory distress. She has no wheezes. She has no rales.  Neurological: She is alert and oriented to person, place, and time.    ED Course  Procedures (including critical care time)  Labs Review Labs Reviewed  POCT I-STAT, CHEM 8 - Abnormal; Notable for the following:    Glucose, Bld 101 (*)    All other components within normal limits    Imaging Review No results found.   MDM   1. Weight loss   2.  Shakiness    I suspect her symptoms are coming from dips in her blood sugar due to an inadequate diet. We have discussed the importance of eating regular small meals during the day. I've provided a prescription for low-dose Megace to help with her appetite. Provided reassurance as well as clarification of her medications. Follow-up as needed.    Melony Overly, MD 04/30/16 1534

## 2016-05-01 ENCOUNTER — Encounter: Payer: Self-pay | Admitting: Nurse Practitioner

## 2016-05-01 ENCOUNTER — Ambulatory Visit (INDEPENDENT_AMBULATORY_CARE_PROVIDER_SITE_OTHER): Payer: Medicaid Other | Admitting: Nurse Practitioner

## 2016-05-01 VITALS — BP 122/76 | HR 84 | Ht 65.0 in | Wt 111.8 lb

## 2016-05-01 DIAGNOSIS — I639 Cerebral infarction, unspecified: Secondary | ICD-10-CM | POA: Diagnosis not present

## 2016-05-01 DIAGNOSIS — G43909 Migraine, unspecified, not intractable, without status migrainosus: Secondary | ICD-10-CM

## 2016-05-01 DIAGNOSIS — M542 Cervicalgia: Secondary | ICD-10-CM | POA: Diagnosis not present

## 2016-05-01 DIAGNOSIS — E785 Hyperlipidemia, unspecified: Secondary | ICD-10-CM | POA: Diagnosis not present
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
## 2016-05-01 NOTE — Patient Instructions (Addendum)
.  Continue aspirin 325 mg daily for secondary stroke prevention  maintain strict control of lipids with LDL cholesterol goal below 70 mg/dL.  Blood pressure is well controlled today 0000000 continue Bystolic Eat a healthy diet with plenty of whole grains, cereals, fruits and vegetables, exercise regularly and maintain ideal body weight Follow up in 6 months

## 2016-05-02 NOTE — Progress Notes (Signed)
I agree with the above plan 

## 2016-05-04 ENCOUNTER — Emergency Department (HOSPITAL_COMMUNITY)
Admission: EM | Admit: 2016-05-04 | Discharge: 2016-05-04 | Disposition: A | Discharge status: Home / Self Care | Payer: Medicaid Other | Attending: Emergency Medicine | Admitting: Emergency Medicine

## 2016-05-04 ENCOUNTER — Emergency Department (HOSPITAL_COMMUNITY): Payer: Medicaid Other

## 2016-05-04 DIAGNOSIS — F419 Anxiety disorder, unspecified: Secondary | ICD-10-CM | POA: Insufficient documentation

## 2016-05-04 DIAGNOSIS — R002 Palpitations: Secondary | ICD-10-CM | POA: Diagnosis present

## 2016-05-04 DIAGNOSIS — E785 Hyperlipidemia, unspecified: Secondary | ICD-10-CM | POA: Diagnosis not present

## 2016-05-04 DIAGNOSIS — Z7982 Long term (current) use of aspirin: Secondary | ICD-10-CM | POA: Insufficient documentation

## 2016-05-04 DIAGNOSIS — Z8673 Personal history of transient ischemic attack (TIA), and cerebral infarction without residual deficits: Secondary | ICD-10-CM | POA: Diagnosis not present

## 2016-05-04 LAB — URINALYSIS, ROUTINE W REFLEX MICROSCOPIC
Bilirubin Urine: NEGATIVE
Glucose, UA: NEGATIVE mg/dL
Hgb urine dipstick: NEGATIVE
Ketones, ur: NEGATIVE mg/dL
Leukocytes, UA: NEGATIVE
Nitrite: NEGATIVE
Protein, ur: NEGATIVE mg/dL
Specific Gravity, Urine: 1.008 (ref 1.005–1.030)
pH: 7 (ref 5.0–8.0)

## 2016-05-04 LAB — BASIC METABOLIC PANEL
Anion gap: 8 (ref 5–15)
BUN: 11 mg/dL (ref 6–20)
CO2: 24 mmol/L (ref 22–32)
Calcium: 9.2 mg/dL (ref 8.9–10.3)
Chloride: 105 mmol/L (ref 101–111)
Creatinine, Ser: 0.69 mg/dL (ref 0.44–1.00)
GFR calc Af Amer: >60 mL/min (ref >60)
GFR calc non Af Amer: >60 mL/min (ref >60)
Glucose, Bld: 129 mg/dL — ABNORMAL HIGH (ref 65–99)
Potassium: 4 mmol/L (ref 3.5–5.1)
Sodium: 137 mmol/L (ref 135–145)

## 2016-05-04 LAB — I-STAT TROPONIN, ED: Troponin i, poc: 0 ng/mL (ref 0.00–0.08)

## 2016-05-04 LAB — CBC
HCT: 38.9 % (ref 36.0–46.0)
Hemoglobin: 12.4 g/dL (ref 12.0–15.0)
MCH: 28.2 pg (ref 26.0–34.0)
MCHC: 31.9 g/dL (ref 30.0–36.0)
MCV: 88.4 fL (ref 78.0–100.0)
Platelets: 410 10*3/uL — ABNORMAL HIGH (ref 150–400)
RBC: 4.4 MIL/uL (ref 3.87–5.11)
RDW: 13 % (ref 11.5–15.5)
WBC: 10 10*3/uL (ref 4.0–10.5)

## 2016-05-04 MED ORDER — LORAZEPAM 1 MG PO TABS
0.5000 mg | ORAL_TABLET | Freq: Once | ORAL | Status: AC
  Administered 2016-05-04: 0.5 mg via ORAL
  Filled 2016-05-04: qty 1

## 2016-05-04 NOTE — ED Notes (Signed)
Pt. Stated, I feel confused

## 2016-05-04 NOTE — ED Provider Notes (Signed)
CSN: [id number]     Arrival date & time 05/04/16  1100 History   First MD Initiated Contact with Patient 05/04/16 1134     Chief Complaint  Patient presents with  . Palpitations  . Generalized Body Aches     (Consider location/radiation/quality/duration/timing/severity/associated sxs/prior Treatment) HPI   45 year old female with palpitations. Onset this morning. Patient states that every morning for the past month she was woken up and felt anxious. She feels like her heart is racing. She feels like she cannot take a deep breath. She does not necessarily feel short of breath though. Denies any chest pain. These symptoms often improve over the period of couple hours and she normally feels better later in the day. She has had some recent medication changes and is concerned that her symptoms may related to this. No fevers or chills. No unusual leg pain or swelling.  Past Medical History  Diagnosis Date  . Stroke ([place])     09/2015  . Hyperlipidemia   . 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 Tee without cardioversion N/A 10/13/2015    Procedure: TRANSESOPHAGEAL ECHOCARDIOGRAM (TEE);  Surgeon: Lelon Perla, MD;  Location: [facility] ENDOSCOPY;  Service: Cardiovascular;  Laterality: N/A;  . Right arm surgery     Family History  Problem Relation Age of Onset  . Asthma Neg Hx   . Cancer Neg Hx   . Diabetes Neg Hx   . Hyperlipidemia Neg Hx   . Heart failure Neg Hx   . Hypertension Neg Hx   . Migraines Neg Hx   . Rashes / Skin problems Neg Hx   . Seizures Neg Hx   . Stroke Neg Hx   . Thyroid disease Neg Hx   . Leukemia Mother    Social History  Substance Use Topics  . Smoking status: Never Smoker   . Smokeless tobacco: Never Used  . Alcohol Use: No   OB History    No data available     Review of Systems  All systems reviewed and negative, other than as noted in HPI.   Allergies  Atorvastatin and Tramadol  Home Medications   Prior  to Admission medications   Medication Sig Start Date End Date Taking? Authorizing Provider  aspirin 325 MG tablet Take 1 tablet (325 mg total) by mouth daily. 03/11/16  Yes Micheline Chapman, NP  nebivolol (BYSTOLIC) 5 MG tablet Take 5 mg by mouth daily.   Yes Historical Provider, MD  sulindac (CLINORIL) 200 MG tablet Take 200 mg by mouth 2 (two) times daily.   Yes Historical Provider, MD   BP 120/81 mmHg  Pulse 91  Temp(Src) 99.5 F (37.5 C)  Resp 13  Ht 5' (1.524 m)  Wt 113 lb (51.256 kg)  BMI 22.07 kg/m2  SpO2 100%  LMP 04/23/2016 (Exact Date) Physical Exam  Constitutional: She appears well-developed and well-nourished. No distress.  HENT:  Head: Normocephalic and atraumatic.  Eyes: Conjunctivae are normal. Right eye exhibits no discharge. Left eye exhibits no discharge.  Neck: Neck supple.  Cardiovascular: Normal rate, regular rhythm and normal heart sounds.  Exam reveals no gallop and no friction rub.   No murmur heard. Pulmonary/Chest: Effort normal and breath sounds normal. No respiratory distress.  Abdominal: Soft. She exhibits no distension. There is no tenderness.  Musculoskeletal: She exhibits no edema or tenderness.  Lower extremities symmetric as compared to each other. No calf tenderness. Negative Homan's. No palpable cords.  Neurological: She is alert.  Skin: Skin is warm and dry.  Psychiatric: She has a normal mood and affect. Her behavior is normal. Thought content normal.  Nursing note and vitals reviewed.   ED Course  Procedures (including critical care time) Labs Review Labs Reviewed  BASIC METABOLIC PANEL - Abnormal; Notable for the following:    Glucose, Bld 129 (*)    All other components within normal limits  CBC - Abnormal; Notable for the following:    Platelets 410 (*)    All other components within normal limits  URINALYSIS, ROUTINE W REFLEX MICROSCOPIC (NOT AT [facility])  Randolm Idol, ED    Imaging Review Dg Chest 2 View  05/04/2016   CLINICAL DATA:  Left chest and back pain. EXAM: CHEST  2 VIEW COMPARISON:  Apr 23, 2016 FINDINGS: The heart size and mediastinal contours are within normal limits. Both lungs are clear. The visualized skeletal structures are unremarkable. IMPRESSION: No active cardiopulmonary disease. Electronically Signed   By: Dorise Bullion III M.D   On: 05/04/2016 11:55   I have personally reviewed and evaluated these images and lab results as part of my medical decision-making.   EKG Interpretation   Date/Time:  Saturday May 04 2016 11:08:28 EDT Ventricular Rate:  99 PR Interval:  122 QRS Duration: 74 QT Interval:  320 QTC Calculation: 410 R Axis:   68 Text Interpretation:  Sinus rhythm with Premature supraventricular  complexes Otherwise normal ECG Confirmed by Wilson Singer  MD, Delphine Sizemore ([id number]) on  05/04/2016 12:46:56 PM      MDM   Final diagnoses:  Palpitations  Anxiety    45 year old female with palpitations. She is having somewhat frequent PVCs. Suspect she may be symptomatic from this. She'll says strikes me is very anxious. Workup fairly unremarkable. Have a low suspicion for emergent alternative process.It has been determined that no acute conditions requiring further emergency intervention are present at this time. The patient has been advised of the diagnosis and plan. I reviewed any labs and imaging including any potential incidental findings. We have discussed signs and symptoms that warrant return to the ED and they are listed in the discharge instructions.      Virgel Manifold, MD 05/04/16 1250

## 2016-05-04 NOTE — Discharge Instructions (Signed)
Palpitaciones (Palpitations) Es la sensacin de sentir que el latido cardaco es irregular o es ms rpido que lo normal. Se siente como un aleteo o que falta un latido. Generalmente no es un problema grave. Sin embargo, en algunos casos podra ser necesario hacer ms estudios diagnsticos. CAUSAS  Las causas de las palpitaciones pueden ser:  Georgiann Hahn.  El consumo de cafena u otros estimulantes, como pastillas para Horticulturist, commercial o bebidas energizantes.  Alcohol.  Situaciones de estrs y [place].  La actividad fsica extenuante.  Fatiga.  Algunos medicamentos.  Enfermedad cardaca, especialmente si tiene antecedentes de ritmo cardaco irregular (arritmia), como fibrilacin auricular, aleteo auricular o taquicardia supraventricular.  El uso incorrecto de un marcapasos o Estate agent. DIAGNSTICO  Para hallar la causa de las palpitaciones, el mdico le har una historia clnica y un examen fsico. El mdico tambin puede hacerle un estudio llamado electrocardiograma (ECG) ambulatorio. El ECG registra el patrn de los latidos cardacos durante un perodo de 24horas. Tambin pueden hacerle otros estudios, por ejemplo:  Ecocardiograma transtorcico (ETT). Durante IT trainer, se usan ondas sonoras para evaluar cmo fluye la sangre por el corazn.  Ecocardiograma transesofgico (ETE).  Monitoreo cardaco. Este estudio permite que el mdico controle la frecuencia y el ritmo cardaco en tiempo real.  Monitor Holter. Es un dispositivo porttil que [employer] latidos cardacos y [place] a Retail buyer las arritmias cardacas. Le permite al [employer] registrar la actividad Dietrich, si es necesario.  Pruebas de estrs por ejercicio o por medicamentos que aceleran los latidos cardacos. [place] palpitaciones depende de la causa y puede variar mucho. En la [employer] no se requiere otro tratamiento que esperar, Nurse, children's y Magazine features editor  los sntomas. Otras causas, como la fibrilacin auricular, el aleteo auricular o la taquicardia supraventricular generalmente requieren Lexicographer. INSTRUCCIONES PARA EL CUIDADO EN EL HOGAR   Evite:  Bebidas que contengan cafena como el caf, el t, los refrescos, las pastillas para Horticulturist, commercial y las bebidas energizantes.  Chocolate.  Alcohol.  Si fuma, abandone el hbito.  Reduzca los niveles de estrs y [place]. Algunas cosas que pueden ayudarlo a relajarse son:  Un mtodo para controlar el cuerpo con la mente, por ejemplo, controlar los latidos (biorregulacin).  El yoga.  La meditacin.  La actividad fsica como natacin, trote o caminatas.  Descanse y duerma lo suficiente. SOLICITE ATENCIN MDICA SI:   Contina con latidos cardacos rpidos o irregulares durante ms de 24 horas.  Las [employer] suceden con ms frecuencia. SOLICITE ATENCIN MDICA DE INMEDIATO SI:  Siente falta de aire o dolor en el pecho.  Sufre un dolor intenso de [place].  Se siente mareado o se desmaya. ASEGRESE DE QUE:  Comprende estas instrucciones.  Controlar su afeccin.  Recibir ayuda de inmediato si no mejora o si empeora.   Esta informacin no tiene Marine scientist el consejo del mdico. Asegrese de hacerle al mdico cualquier pregunta que tenga.   Document Released: 08/21/2005 Document Revised: 11/16/2013 Elsevier Interactive Patient Education 2016 [place] ang[place]ia (Panic Attacks) Las crisis de [place] son ataques repentinos y [place], miedo o Tree surgeon extremos. Es posible que ocurran sin motivo, cuando est relajado, ansioso o cuando duerme. Las crisis de [place] pueden ocurrir por algunas de estas razones:   En ocasiones, las personas sanas presentan crisis de [place] en situaciones extremas, potencialmente mortales, como la guerra o los desastres naturales. La ansiedad normal es un mecanismo de defensa del  cuerpo que nos ayuda a  Firefighter ante situaciones de peligro (respuesta de defensa o huida).  Con frecuencia, las crisis de [place] aparecen acompaadas de trastornos de ansiedad, como trastorno de pnico, trastorno de ansiedad social, trastorno de ansiedad generalizada y fobias. Los trastornos de ansiedad provocan ansiedad excesiva o incontrolable. Sus relaciones y actividades pueden verse Technical sales engineer.  En ocasiones, las crisis de ansiedad se presentan con otras enfermedades mentales, como la depresin y el trastorno por estrs postraumtico.  Algunas enfermedades, medicamentos recetados y drogas pueden provocar crisis de [place]. SNTOMAS  Las crisis de [place] comienzan repentinamente, Artist punto mximo a los 20 minutos y se presentan junto con cuatro o ms de los siguientes sntomas:  Latidos cardacos acelerados o frecuencia cardaca elevada (palpitaciones).  Sudoracin.  Temblores o sacudidas.  Dificultad para respirar o sensacin de asfixia.  Sensacin de [employer].  Dolor o Adult nurse.  Nuseas o sensacin extraa en el estmago.  Mareos, sensacin de desvanecimiento o de desmayo.  Escalofros o sofocos.  Hormigueos o adormecimiento en [place] y los pies.  Sensacin de [employer] no son reales o de que no es usted mismo.  Temor a perder el control o el juicio.  Temor a Corporate treasurer. Algunos de estos sntomas pueden parecerse a enfermedades graves. Por ejemplo, es posible que piense que tendr un ataque cardaco. Aunque las crisis de [place] pueden ser muy atemorizantes, no son potencialmente mortales. DIAGNSTICO  Las crisis de [place] se diagnostican con una evaluacin que realiza el mdico. Su mdico le realizar preguntas sobre los sntomas, como cundo y dnde ocurrieron. Tambin le preguntar sobre su historia clnica y [place] consumo de alcohol y drogas, incluidos los medicamentos recetados. Es posible que su mdico le indique anlisis de sangre u otros  estudios para Teacher, English as a foreign language graves. El mdico podr derivarlo a un profesional de la salud mental para que le realice una evaluacin ms profunda. TRATAMIENTO   En general, las personas sanas que registran una o dos crisis de [place] bajo una situacin extrema, potencialmente mortal, no requerirn Clinical research associate.  El [place] de las crisis de [place] asociadas con trastornos de ansiedad u otras enfermedades mentales, generalmente, requiere orientacin por parte de un profesional de la salud mental medicamentos, o bien la combinacin de [place]. Su mdico le ayudar a Leisure centre manager tratamiento para usted.  Las crisis de [place] asociadas a enfermedades fsicas, generalmente, desaparecen con el tratamiento de la enfermedad. Si un medicamento recetado le causa crisis de [place], consulte a su mdico si debe suspenderlo, disminuir la dosis o sustituirlo por otro medicamento.  Las crisis de [place] asociadas al consumo de drogas o alcohol desaparecen con la abstinencia. Algunos adultos necesitan ayuda profesional para dejar de beber o de consumir drogas. INSTRUCCIONES PARA EL CUIDADO EN EL HOGAR   Tome todos los medicamentos como le indic el mdico.  Planifique y concurra a todas las visitas de control, segn le indique el mdico. Es importante que concurra a todas las visitas. SOLICITE ATENCIN MDICA SI:  No puede tomar los [employer] se lo han indicado.  Los sntomas no mejoran o empeoran. SOLICITE ATENCIN MDICA DE INMEDIATO SI:   Experimenta sntomas de crisis de [place] diferentes de los que presenta habitualmente.  Tiene pensamientos serios acerca de lastimarse a usted mismo o daar a Producer, television/film/video.  Toma medicamentos para las crisis de [place] y presenta efectos secundarios graves. ASEGRESE DE QUE:  Comprende estas instrucciones.  Controlar su afeccin.  Recibir ayuda de inmediato si no mejora o si empeora.   Esta informacin no tiene Hydrologist el consejo del mdico. Asegrese de hacerle al mdico cualquier pregunta que tenga.   Document Released: 11/11/2005 Document Revised: 11/16/2013 Elsevier Interactive Patient Education [employer].

## 2016-05-04 NOTE — ED Notes (Signed)
Pt. Stated, i started taking a new medicine Sulindac200mg . , I've taken it twice and today I feel my heart racing and my body feels bad.

## 2016-05-07 ENCOUNTER — Ambulatory Visit (INDEPENDENT_AMBULATORY_CARE_PROVIDER_SITE_OTHER): Payer: Medicaid Other | Admitting: Cardiovascular Disease

## 2016-05-07 VITALS — BP 113/75 | HR 98 | Ht 61.0 in | Wt 110.2 lb

## 2016-05-07 DIAGNOSIS — I493 Ventricular premature depolarization: Secondary | ICD-10-CM | POA: Diagnosis not present

## 2016-05-07 DIAGNOSIS — E785 Hyperlipidemia, unspecified: Secondary | ICD-10-CM

## 2016-05-07 MED ORDER — DILTIAZEM HCL ER COATED BEADS 120 MG PO CP24: 120.0000 mg | ORAL_CAPSULE | Freq: Every day | ORAL | Status: DC

## 2016-05-07 MED ORDER — MEGESTROL ACETATE 400 MG/10ML PO SUSP: 400.0000 mg | Freq: Every day | ORAL | Status: DC

## 2016-05-07 NOTE — Progress Notes (Signed)
Cardiology Office Note   Date:  05/08/2016   ID:  Carla [name], DOB August 04, 1971, MRN [id number]  PCP:  Concepcion Living, NP  Cardiologist:   Chilton Si, MD   Chief Complaint  Patient presents with  . Follow-up    1 Month, pt denied chest pain and SOB     History of Present Illness: Carla [name] is a 45 y.o. female with hyperlipidemia and prior stroke who presents for palpitations.  Carla [name] was seen in the emergency department on 03/24/16 with palpitations. She was noted to have frequent PVCs. Troponin was negative and chest x-ray was unremarkable.  She followed up with Carla Skene, PA-C, the following day, where she was started on metoprolol for PVCs. She also had a 24-hour Holter that revealed 15% PVCs.  Carla [name] had an echo at the time of her stroke in 09/2015 that revealed LVEF 55-60% with grade 1 diastolic dysfunction. There is mild aortic regurgitation.  She did not tolerate metoprolol due to myalgias and shaking, though it did limit her palpitations.  She was seen in clinic on 6/1 and switched from metoprolol to nebivolol.    Since her last appointment Carla [name] only had one episode of palpitations.  She no longer has muscle aches but she now has panic attacks.  Each morning she awakens feeling anxious, nervous and jittery.  She attributes this to the nebivolol.  She has not taken it for two days.  Also, it is not covered by her insurance so it was very expensive.  She denies chest pain, shortness of breath, lower extremity edema, orthopnea or PND.  Past Medical History  Diagnosis Date  . Stroke ([facility])     09/2015  . Hyperlipidemia   . 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 Tee without cardioversion N/A 10/13/2015    Procedure: TRANSESOPHAGEAL ECHOCARDIOGRAM (TEE);  Surgeon: Lewayne Bunting, MD;  [facility] ENDOSCOPY;  Service: Cardiovascular;  Laterality: N/A;  . Right arm surgery       Current Outpatient  Prescriptions  Medication Sig Dispense Refill  . aspirin 325 MG tablet Take 1 tablet (325 mg total) by mouth daily. 30 tablet 2  . atorvastatin (LIPITOR) 10 MG tablet Take 10 mg by mouth daily.    Marland Kitchen diltiazem (CARDIZEM CD) 120 MG 24 hr capsule Take 1 capsule (120 mg total) by mouth daily. 90 capsule 3  . meclizine (ANTIVERT) 25 MG tablet Take 1 tablet (25 mg total) by mouth 3 (three) times daily as needed for dizziness. 15 tablet 0  . megestrol (MEGACE) 400 MG/10ML suspension Take 10 mLs (400 mg total) by mouth daily. 240 mL 0  . [DISCONTINUED] pravastatin (PRAVACHOL) 40 MG tablet Take 1 tablet (40 mg total) by mouth daily. 30 tablet 2  . [DISCONTINUED] zolpidem (AMBIEN) 5 MG tablet Take 1 tablet (5 mg total) by mouth at bedtime as needed for sleep. 15 tablet 0   No current facility-administered medications for this visit.    Allergies:   Atorvastatin and Tramadol    Social History:  The patient  reports that she has never smoked. She has never used smokeless tobacco. She reports that she does not drink alcohol or use illicit drugs.   Family History:  The patient's family history includes Leukemia in her mother. There is no history of Asthma, Cancer, Diabetes, Hyperlipidemia, Heart failure, Hypertension, Migraines, Rashes / Skin problems, Seizures, Stroke, or Thyroid disease.    ROS:  Please  see the history of present illness.   Otherwise, review of systems are positive for weight loss.   All other systems are reviewed and negative.    PHYSICAL EXAM: VS:  BP 113/75 mmHg  Pulse 98  Ht 5\' 1"  (1.549 m)  Wt 110 lb 3.2 oz (49.986 kg)  BMI 20.83 kg/m2  LMP 04/23/2016 (Exact Date) , BMI Body mass index is 20.83 kg/(m^2). GENERAL:  Well appearing HEENT:  Pupils equal round and reactive, fundi not visualized, oral mucosa unremarkable NECK:  No jugular venous distention, waveform within normal limits, carotid upstroke brisk and symmetric, no bruits, no thyromegaly LYMPHATICS:  No cervical  adenopathy LUNGS:  Clear to auscultation bilaterally HEART:  Mostly regular with frequent ectopy.   PMI not displaced or sustained,S1 and S2 within normal limits, no S3, no S4, no clicks, no rubs, no murmurs ABD:  Flat, positive bowel sounds normal in frequency in pitch, no bruits, no rebound, no guarding, no midline pulsatile mass, no hepatomegaly, no splenomegaly EXT:  2 plus pulses throughout, no edema, no cyanosis no clubbing SKIN:  No rashes no nodules NEURO:  Cranial nerves II through XII grossly intact, motor grossly intact throughout PSYCH:  Cognitively intact, oriented to person place and time  EKG:  EKG is not ordered today. The ekg ordered 04/24/16 demonstrates  sinus rhythm. Rate 97 bpm. Frequent PVCs.  Echo 10/12/15: Study Conclusions  - Left ventricle: The cavity size was normal. Wall thickness was  increased in a pattern of mild LVH. Systolic function was normal.  The estimated ejection fraction was in the range of 55% to 60%.  Although no diagnostic regional wall motion abnormality was  identified, this possibility cannot be completely excluded on the  basis of this study. Doppler parameters are consistent with  abnormal left ventricular relaxation (grade 1 diastolic  dysfunction). - Aortic valve: There was mild regurgitation.  TEE 10/13/15:  LVEF 55-60%.  Negative bubble study.  24 Hour Holter Monitor 04/02/16:  Quality: Fair. Baseline artifact. Predominant rhythm: sinus rhythm with frequent PVCs Average heart rate: 95bpm Max heart rate: 140 bpm Min heart rate: 72 bpm  Ventricular ectopics: 19,584  Percentage of ectopic beats: 15.6% Morphology: polymorphic with one dominant morphology   Recent Labs: 05/08/2016: ALT 26; BUN 10; Creatinine, Ser 0.70; Hemoglobin 14.6; Platelets 394; Potassium 4.0; Sodium 139    Lipid Panel    Component Value Date/Time   CHOL 207* 10/12/2015 0638   TRIG 89 10/12/2015 0638   HDL 50 10/12/2015 0638   CHOLHDL 4.1  10/12/2015 0638   VLDL 18 10/12/2015 0638   LDLCALC 139* 10/12/2015 0638      Wt Readings from Last 3 Encounters:  05/08/16 110 lb 3.2 oz (49.986 kg)  05/07/16 110 lb 3.2 oz (49.986 kg)  05/04/16 113 lb (51.256 kg)      ASSESSMENT AND PLAN:  # PVCs: Ms. Legate thinks that nebivolol was helpful, but it made her feel anxious.  She also did not tolerate metoprolol.  We will start diltiazem CD 120 mg daily.  Once we find a medication that she can tolerate we will repeat her 24-hour Holter to assess her PVC burden. If it is greater than 10% we will refer her to electrophysiology for consideration of ablation. She does not currently have any evidence of systolic dysfunction on her echo.    # Hyperlipidemia: Ms. Allingham has a history of stroke and was started on atorvastatin. This was stopped due to concern that it was causing myalgias and  then restarted once it was determined that the metoprolol was causing these symptoms.  We will repeat lipids and LFTs at her follow up appointment.    # Prior stroke: Continue aspirin 325mg  daily and atorvastatin.  Current medicines are reviewed at length with the patient today.  The patient does not have concerns regarding medicines.  The following changes have been made:  Stop nebivolol.  Start diltiazem  Labs/ tests ordered today include:  No orders of the defined types were placed in this encounter.     Disposition:   FU with Hanley Rispoli C. Duke Salvia, MD, [facility] in 1 month.   This note was written with the assistance of speech recognition software.  Please excuse any transcriptional errors.  Signed, Shalom Mcguiness C. Duke Salvia, MD, [facility]  05/08/2016 10:33 PM    [facility]

## 2016-05-07 NOTE — Patient Instructions (Signed)
Medication Instructions:   STOP BYSTOLIC  START DILTIAZEM CD 120 MG ONCE DAILY  START MEGACE 10 ML ONCE DAILY  Follow-Up:  Your physician recommends that you schedule a follow-up appointment in: [facility]

## 2016-05-08 ENCOUNTER — Emergency Department (HOSPITAL_COMMUNITY): Payer: Medicaid Other

## 2016-05-08 ENCOUNTER — Emergency Department (HOSPITAL_COMMUNITY)
Admission: EM | Admit: 2016-05-08 | Discharge: 2016-05-09 | Disposition: A | Discharge status: Home / Self Care | Payer: Medicaid Other | Attending: Emergency Medicine | Admitting: Emergency Medicine

## 2016-05-08 ENCOUNTER — Encounter: Payer: Self-pay | Admitting: Cardiovascular Disease

## 2016-05-08 ENCOUNTER — Encounter (HOSPITAL_COMMUNITY): Payer: Self-pay | Admitting: Family Medicine

## 2016-05-08 DIAGNOSIS — Z8673 Personal history of transient ischemic attack (TIA), and cerebral infarction without residual deficits: Secondary | ICD-10-CM | POA: Diagnosis not present

## 2016-05-08 DIAGNOSIS — Z7982 Long term (current) use of aspirin: Secondary | ICD-10-CM | POA: Insufficient documentation

## 2016-05-08 DIAGNOSIS — R791 Abnormal coagulation profile: Secondary | ICD-10-CM | POA: Insufficient documentation

## 2016-05-08 DIAGNOSIS — R51 Headache: Secondary | ICD-10-CM | POA: Diagnosis not present

## 2016-05-08 DIAGNOSIS — H81399 Other peripheral vertigo, unspecified ear: Secondary | ICD-10-CM | POA: Diagnosis not present

## 2016-05-08 DIAGNOSIS — R42 Dizziness and giddiness: Secondary | ICD-10-CM | POA: Diagnosis present

## 2016-05-08 LAB — I-STAT CHEM 8, ED
BUN: 10 mg/dL (ref 6–20)
Calcium, Ion: 1.19 mmol/L (ref 1.12–1.23)
Chloride: 102 mmol/L (ref 101–111)
Creatinine, Ser: 0.7 mg/dL (ref 0.44–1.00)
Glucose, Bld: 201 mg/dL — ABNORMAL HIGH (ref 65–99)
HEMATOCRIT: 43 % (ref 36.0–46.0)
HEMOGLOBIN: 14.6 g/dL (ref 12.0–15.0)
POTASSIUM: 4 mmol/L (ref 3.5–5.1)
SODIUM: 139 mmol/L (ref 135–145)
TCO2: 24 mmol/L (ref 0–100)

## 2016-05-08 LAB — COMPREHENSIVE METABOLIC PANEL
ALBUMIN: 3.7 g/dL (ref 3.5–5.0)
ALK PHOS: 58 U/L (ref 38–126)
ALT: 26 U/L (ref 14–54)
AST: 24 U/L (ref 15–41)
Anion gap: 9 (ref 5–15)
BUN: 9 mg/dL (ref 6–20)
CALCIUM: 9.4 mg/dL (ref 8.9–10.3)
CHLORIDE: 105 mmol/L (ref 101–111)
CO2: 22 mmol/L (ref 22–32)
CREATININE: 0.78 mg/dL (ref 0.44–1.00)
GFR calc non Af Amer: >60 mL/min (ref >60)
GLUCOSE: 205 mg/dL — AB (ref 65–99)
Potassium: 3.9 mmol/L (ref 3.5–5.1)
SODIUM: 136 mmol/L (ref 135–145)
Total Bilirubin: 1.2 mg/dL (ref 0.3–1.2)
Total Protein: 6.8 g/dL (ref 6.5–8.1)

## 2016-05-08 LAB — APTT: APTT: 30 s (ref 24–37)

## 2016-05-08 LAB — CBC
HEMATOCRIT: 41 % (ref 36.0–46.0)
HEMOGLOBIN: 13.2 g/dL (ref 12.0–15.0)
MCH: 28.3 pg (ref 26.0–34.0)
MCHC: 32.2 g/dL (ref 30.0–36.0)
MCV: 88 fL (ref 78.0–100.0)
Platelets: 394 10*3/uL (ref 150–400)
RBC: 4.66 MIL/uL (ref 3.87–5.11)
RDW: 13.3 % (ref 11.5–15.5)
WBC: 13.2 10*3/uL — AB (ref 4.0–10.5)

## 2016-05-08 LAB — DIFFERENTIAL
BASOS PCT: 0 %
Basophils Absolute: 0 10*3/uL (ref 0.0–0.1)
Eosinophils Absolute: 0 10*3/uL (ref 0.0–0.7)
Eosinophils Relative: 0 %
Lymphocytes Relative: 14 %
Lymphs Abs: 1.8 10*3/uL (ref 0.7–4.0)
MONO ABS: 0.7 10*3/uL (ref 0.1–1.0)
Monocytes Relative: 5 %
NEUTROS PCT: 81 %
Neutro Abs: 10.7 10*3/uL — ABNORMAL HIGH (ref 1.7–7.7)

## 2016-05-08 LAB — CBG MONITORING, ED: GLUCOSE-CAPILLARY: 92 mg/dL (ref 65–99)

## 2016-05-08 LAB — PROTIME-INR
INR: 1.11 (ref 0.00–1.49)
Prothrombin Time: 14.5 seconds (ref 11.6–15.2)

## 2016-05-08 LAB — I-STAT BETA HCG BLOOD, ED (MC, WL, AP ONLY): I-stat hCG, quantitative: <5 m[IU]/mL (ref <5)

## 2016-05-08 LAB — I-STAT TROPONIN, ED: Troponin i, poc: 0 ng/mL (ref 0.00–0.08)

## 2016-05-08 MED ORDER — SODIUM CHLORIDE 0.9 % IV BOLUS (SEPSIS)
1000.0000 mL | Freq: Once | INTRAVENOUS | Status: AC
  Administered 2016-05-08: 1000 mL via INTRAVENOUS

## 2016-05-08 MED ORDER — MECLIZINE HCL 25 MG PO TABS: 25.0000 mg | ORAL_TABLET | Freq: Three times a day (TID) | ORAL | Status: DC | PRN

## 2016-05-08 MED ORDER — MECLIZINE HCL 25 MG PO TABS
25.0000 mg | ORAL_TABLET | Freq: Once | ORAL | Status: AC
  Administered 2016-05-08: 25 mg via ORAL
  Filled 2016-05-08: qty 1

## 2016-05-08 MED ORDER — ACETAMINOPHEN 325 MG PO TABS
650.0000 mg | ORAL_TABLET | Freq: Once | ORAL | Status: AC
  Administered 2016-05-08: 650 mg via ORAL
  Filled 2016-05-08: qty 2

## 2016-05-08 NOTE — ED Notes (Signed)
CHECKED [facility] 92 RN AMY INFORMED

## 2016-05-08 NOTE — ED Notes (Signed)
Patient ambulated to restroom and tolerated fair.  Patient does complain of dizziness when she walks.

## 2016-05-08 NOTE — ED Notes (Signed)
Pt here for dizziness that started Saturday. sts she was seen here Saturday for the same. sts her colored changed and she has been loosing. Weight.

## 2016-05-08 NOTE — ED Provider Notes (Signed)
CSN: [id number]     Arrival date & time 05/08/16  0912 History   First MD Initiated Contact with Patient 05/08/16 0940     Chief Complaint  Patient presents with  . Dizziness     (Consider location/radiation/quality/duration/timing/severity/associated sxs/prior Treatment) HPI  45 year old female presents with dizziness for the past 4 days. Patient states that it feels like everything is spinning. This is intermittent, most noticed when standing up or walking. Has a prior history of vertigo but does not have any medicine. Feels somewhat like prior history of vertigo. Also complains of an occipital and right-sided headache. She has had headaches like this for the past 2 months, intermittently. Had a stroke in November 2016 but at that time she had a severe headache, vomiting, and right-sided numbness. The symptoms are not present now. This headache is only a 5/10. However she still feels dizzy whenever she stands. Feels okay at rest. Chronic history of frequent PVCs but currently these are nonsymptomatic. No chest pain. No neck pain or stiffness. No weakness/numbness.  Past Medical History  Diagnosis Date  . Stroke ([place])     09/2015  . Hyperlipidemia   . 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 Tee without cardioversion N/A 10/13/2015    Procedure: TRANSESOPHAGEAL ECHOCARDIOGRAM (TEE);  Surgeon: Lelon Perla, MD;  Location: [facility] ENDOSCOPY;  Service: Cardiovascular;  Laterality: N/A;  . Right arm surgery     Family History  Problem Relation Age of Onset  . Asthma Neg Hx   . Cancer Neg Hx   . Diabetes Neg Hx   . Hyperlipidemia Neg Hx   . Heart failure Neg Hx   . Hypertension Neg Hx   . Migraines Neg Hx   . Rashes / Skin problems Neg Hx   . Seizures Neg Hx   . Stroke Neg Hx   . Thyroid disease Neg Hx   . Leukemia Mother    Social History  Substance Use Topics  . Smoking status: Never Smoker   . Smokeless tobacco: Never Used  . Alcohol Use:  No   OB History    No data available     Review of Systems  Constitutional: Negative for fever.  Cardiovascular: Negative for chest pain and palpitations.  Gastrointestinal: Negative for nausea and vomiting.  Musculoskeletal: Negative for neck pain and neck stiffness.  Neurological: Positive for dizziness and headaches.  All other systems reviewed and are negative.     Allergies  Atorvastatin and Tramadol  Home Medications   Prior to Admission medications   Medication Sig Start Date End Date Taking? Authorizing Provider  aspirin 325 MG tablet Take 1 tablet (325 mg total) by mouth daily. 03/11/16   Micheline Chapman, NP  atorvastatin (LIPITOR) 10 MG tablet Take 10 mg by mouth daily.    Historical Provider, MD  diltiazem (CARDIZEM CD) 120 MG 24 hr capsule Take 1 capsule (120 mg total) by mouth daily. 05/07/16   Skeet Latch, MD  megestrol (MEGACE) 400 MG/10ML suspension Take 10 mLs (400 mg total) by mouth daily. 05/07/16   Skeet Latch, MD   BP 126/82 mmHg  Pulse 64  Temp(Src) 98 F (36.7 C) (Oral)  Resp 18  Wt 110 lb 3.2 oz (49.986 kg)  SpO2 99%  LMP 04/23/2016 (Exact Date) Physical Exam  Constitutional: She is oriented to person, place, and time. She appears well-developed and well-nourished. No distress.  HENT:  Head: Normocephalic and atraumatic.  Right Ear: External  ear normal.  Left Ear: External ear normal.  Nose: Nose normal.  Eyes: EOM are normal. Pupils are equal, round, and reactive to light. Right eye exhibits no discharge. Left eye exhibits no discharge.  Neck: Normal range of motion. Neck supple.  No meningismus  Cardiovascular: Normal rate, regular rhythm and normal heart sounds.   Pulmonary/Chest: Effort normal and breath sounds normal.  Abdominal: Soft. She exhibits no distension. There is no tenderness.  Neurological: She is alert and oriented to person, place, and time.  CN 3-12 grossly intact. 5/5 strength in all 4 extremities. Grossly normal  sensation. Normal finger to nose  Skin: Skin is warm and dry. She is not diaphoretic.  Nursing note and vitals reviewed.   ED Course  Procedures (including critical care time) Labs Review Labs Reviewed  CBC - Abnormal; Notable for the following:    WBC 13.2 (*)    All other components within normal limits  DIFFERENTIAL - Abnormal; Notable for the following:    Neutro Abs 10.7 (*)    All other components within normal limits  COMPREHENSIVE METABOLIC PANEL - Abnormal; Notable for the following:    Glucose, Bld 205 (*)    All other components within normal limits  I-STAT CHEM 8, ED - Abnormal; Notable for the following:    Glucose, Bld 201 (*)    All other components within normal limits  PROTIME-INR  APTT  I-STAT TROPOININ, ED  CBG MONITORING, ED  I-STAT BETA HCG BLOOD, ED ([facility], [facility], [facility] ONLY)    Imaging Review Ct Head Wo Contrast  05/08/2016  CLINICAL DATA:  Intermittent headaches, primarily on the right. Recent transient confusion EXAM: CT HEAD WITHOUT CONTRAST TECHNIQUE: Contiguous axial images were obtained from the base of the skull through the vertex without intravenous contrast. COMPARISON:  Apr 10, 2016 FINDINGS: The ventricles are normal in size and configuration. There is no intracranial mass, hemorrhage, extra-axial fluid collection, or midline shift. Gray-white compartments appear normal. No acute infarct evident. Bony calvarium appears intact. Mastoid air cells are clear. No intraorbital lesions are evident. There is a small calcification in the subcutaneous soft tissues over the superior right orbit, stable. IMPRESSION: Small calcification in the subcutaneous soft tissues over the superior right orbit, stable. No intracranial mass, hemorrhage, or focal gray - white compartment/acute appearing infarct. Electronically Signed   By: Lowella Grip III M.D.   On: 05/08/2016 10:50   Mr Brain Wo Contrast  05/08/2016  CLINICAL DATA:  Occipital headache.  Dizziness. EXAM: MRI HEAD  WITHOUT CONTRAST TECHNIQUE: Multiplanar, multiecho pulse sequences of the brain and surrounding structures were obtained without intravenous contrast. COMPARISON:  MRI 10/11/2015 FINDINGS: Ventricle size normal.  Cerebral volume normal. Negative for acute infarct. Hyperintensity in the left corona radiata has contracted since the prior study. Other areas of T2 and diffusion hyperintensity in the left parietal operculum cortex no longer visualized. Small hyperintensity in the left frontal subcortical white matter has contracted. These are most compatible with areas of chronic infarction. Brainstem and cerebellum normal Negative for intracranial hemorrhage or fluid collection. Negative for mass or edema.  No shift of the midline structures. Paranasal sinuses clear. Normal orbit bilaterally. Pituitary and skull base normal. Circle of Willis vessels are patent. IMPRESSION: Significant improvement in left hemispheric lesions felt to be resolving infarcts. Demyelinating disease considered significantly less likely. No acute abnormality Electronically Signed   By: Franchot Gallo M.D.   On: 05/08/2016 13:47   I have personally reviewed and evaluated these images and  lab results as part of my medical decision-making.   EKG Interpretation   Date/Time:  Wednesday May 08 2016 09:22:04 EDT Ventricular Rate:  114 PR Interval:  116 QRS Duration: 78 QT Interval:  310 QTC Calculation: 427 R Axis:   72 Text Interpretation:  Sinus tachycardia with Premature supraventricular  complexes and with frequent Premature ventricular complexes Cannot rule  out Anterior infarct , age undetermined Abnormal ECG no significant change  since May 04 2016 Confirmed by Regenia Skeeter MD, [place] [phone]) on 05/08/2016  9:34:10 AM      MDM   Final diagnoses:  Peripheral vertigo, unspecified laterality    Patient is likely suffering from peripheral vertigo. She feels much better after Antivert. She ambulated without difficulty. Given  her prior history of stroke 7 months ago, MRI obtained to rule out posterior circulation stroke. This is unremarkable except for chronic findings from her old stroke. No acute neurologic findings. Discharge with Antivert and follow-up with her neurologist. Discussed return precautions.    Sherwood Gambler, MD 05/08/16 1540

## 2016-05-11 ENCOUNTER — Other Ambulatory Visit: Payer: Self-pay

## 2016-05-11 ENCOUNTER — Emergency Department (HOSPITAL_COMMUNITY)
Admission: EM | Admit: 2016-05-11 | Discharge: 2016-05-11 | Disposition: A | Discharge status: Home / Self Care | Payer: Medicaid Other | Attending: Emergency Medicine | Admitting: Emergency Medicine

## 2016-05-11 ENCOUNTER — Encounter (HOSPITAL_COMMUNITY): Payer: Self-pay | Admitting: *Deleted

## 2016-05-11 ENCOUNTER — Emergency Department (HOSPITAL_COMMUNITY): Payer: Medicaid Other

## 2016-05-11 DIAGNOSIS — Z79899 Other long term (current) drug therapy: Secondary | ICD-10-CM | POA: Diagnosis not present

## 2016-05-11 DIAGNOSIS — Z7982 Long term (current) use of aspirin: Secondary | ICD-10-CM | POA: Insufficient documentation

## 2016-05-11 DIAGNOSIS — R079 Chest pain, unspecified: Secondary | ICD-10-CM

## 2016-05-11 DIAGNOSIS — Z8673 Personal history of transient ischemic attack (TIA), and cerebral infarction without residual deficits: Secondary | ICD-10-CM | POA: Insufficient documentation

## 2016-05-11 DIAGNOSIS — R42 Dizziness and giddiness: Secondary | ICD-10-CM

## 2016-05-11 DIAGNOSIS — I493 Ventricular premature depolarization: Secondary | ICD-10-CM | POA: Insufficient documentation

## 2016-05-11 LAB — BASIC METABOLIC PANEL
Anion gap: 8 (ref 5–15)
BUN: 8 mg/dL (ref 6–20)
CHLORIDE: 109 mmol/L (ref 101–111)
CO2: 21 mmol/L — ABNORMAL LOW (ref 22–32)
CREATININE: 0.79 mg/dL (ref 0.44–1.00)
Calcium: 9.1 mg/dL (ref 8.9–10.3)
Glucose, Bld: 138 mg/dL — ABNORMAL HIGH (ref 65–99)
Potassium: 3.9 mmol/L (ref 3.5–5.1)
SODIUM: 138 mmol/L (ref 135–145)

## 2016-05-11 LAB — CBC
HEMATOCRIT: 38.8 % (ref 36.0–46.0)
HEMOGLOBIN: 12.4 g/dL (ref 12.0–15.0)
MCH: 28.1 pg (ref 26.0–34.0)
MCHC: 32 g/dL (ref 30.0–36.0)
MCV: 87.8 fL (ref 78.0–100.0)
Platelets: 333 10*3/uL (ref 150–400)
RBC: 4.42 MIL/uL (ref 3.87–5.11)
RDW: 13.2 % (ref 11.5–15.5)
WBC: 12.3 10*3/uL — AB (ref 4.0–10.5)

## 2016-05-11 LAB — I-STAT TROPONIN, ED: Troponin i, poc: 0 ng/mL (ref 0.00–0.08)

## 2016-05-11 NOTE — ED Notes (Signed)
Pt c/o dizziness onset x 2 mths worse in the morning & heart palpitations, pt c/o mid chest onset yesterday, pt c/o n/v, with x 1 vomiting episodes today, pt rcvd 324 mg ASA pta, pt denies pain upon arrival to ED, pt A&O x4

## 2016-05-11 NOTE — Discharge Instructions (Signed)
Continue taking your meclizine as directed. As we discussed, you may take this 3x a day to help better control your dizziness. Please follow-up with Dr. Leonie Man next week as scheduled. Also recommend that you follow-up with your primary care doctor. Return here for new concerns.

## 2016-05-11 NOTE — ED Provider Notes (Signed)
CSN: [id number]     Arrival date & time 05/11/16  [id number] History   First MD Initiated Contact with Patient 05/11/16 0932     Chief Complaint  Patient presents with  . Chest Pain  . Dizziness     (Consider location/radiation/quality/duration/timing/severity/associated sxs/prior Treatment) Patient is a 45 y.o. female presenting with chest pain and dizziness. The history is provided by the patient and medical records.  Chest Pain Associated symptoms: dizziness and nausea   Dizziness Associated symptoms: chest pain and nausea     45 year old female with history stroke, hyperlipidemia, PVCs, presenting to the ED for multiple complaints.  Patient reports for the past 2 months she has had dizziness in the morning upon waking. She states this is a "room spinning" sensation. She reports this will last for approximately 20-30 minutes before resolving. She states during this time she generally has some heart palpitations. States she has nausea and occasional vomiting when dizziness occurs. States yesterday she did have a short episode of mid chest pain which resolved after 5 minutes. She denies any neck/arm pain, diaphoresis, numbness, or focal weakness.  She has no known cardiac history aside from PVCs. No family cardiac history. Patient is not a smoker. Patient was seen in the ED 3 days ago for similar complaints of dizziness. She had an MRI at that time to rule out cerebellar circulation stroke, MRI was negative for acute findings. She was instructed to follow-up with her outpatient neurologist, however she has not done so. She reports she does have a prescription for meclizine at home, however she only takes this once a day as she feels "it is too much medication for her".  Patient is requesting a hospital admission for 2 days so that we can "check everything". Patient denies any current chest pain or dizziness at the time of my assessment. She states she is just concerned because she continues having  persistent dizziness and palpitations.    Past Medical History  Diagnosis Date  . Stroke ([place])     09/2015  . Hyperlipidemia   . 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 Tee without cardioversion N/A 10/13/2015    Procedure: TRANSESOPHAGEAL ECHOCARDIOGRAM (TEE);  Surgeon: Lelon Perla, MD;  Location: [facility] ENDOSCOPY;  Service: Cardiovascular;  Laterality: N/A;  . Right arm surgery     Family History  Problem Relation Age of Onset  . Asthma Neg Hx   . Cancer Neg Hx   . Diabetes Neg Hx   . Hyperlipidemia Neg Hx   . Heart failure Neg Hx   . Hypertension Neg Hx   . Migraines Neg Hx   . Rashes / Skin problems Neg Hx   . Seizures Neg Hx   . Stroke Neg Hx   . Thyroid disease Neg Hx   . Leukemia Mother    Social History  Substance Use Topics  . Smoking status: Never Smoker   . Smokeless tobacco: Never Used  . Alcohol Use: No   OB History    No data available     Review of Systems  Cardiovascular: Positive for chest pain.  Gastrointestinal: Positive for nausea.  Neurological: Positive for dizziness.  All other systems reviewed and are negative.     Allergies  Atorvastatin and Tramadol  Home Medications   Prior to Admission medications   Medication Sig Start Date End Date Taking? Authorizing Provider  aspirin 325 MG tablet Take 1 tablet (325 mg total) by mouth daily.  03/11/16   Micheline Chapman, NP  atorvastatin (LIPITOR) 10 MG tablet Take 10 mg by mouth daily.    Historical Provider, MD  diltiazem (CARDIZEM CD) 120 MG 24 hr capsule Take 1 capsule (120 mg total) by mouth daily. 05/07/16   Skeet Latch, MD  meclizine (ANTIVERT) 25 MG tablet Take 1 tablet (25 mg total) by mouth 3 (three) times daily as needed for dizziness. 05/08/16   Sherwood Gambler, MD  megestrol (MEGACE) 400 MG/10ML suspension Take 10 mLs (400 mg total) by mouth daily. 05/07/16   Skeet Latch, MD   BP 112/91 mmHg  Pulse 29  Resp 21  Ht 5\' 5"  (1.651 m)   SpO2 100%  LMP 04/23/2016 (Exact Date)   Physical Exam  Constitutional: She is oriented to person, place, and time. She appears well-developed and well-nourished. No distress.  HENT:  Head: Normocephalic and atraumatic.  Mouth/Throat: Oropharynx is clear and moist.  Eyes: Conjunctivae and EOM are normal. Pupils are equal, round, and reactive to light.  EOMs fully intact, no nystagmus  Neck: Normal range of motion. Neck supple.  Cardiovascular: Normal rate, regular rhythm and normal heart sounds.   Pulmonary/Chest: Effort normal and breath sounds normal. No respiratory distress. She has no wheezes.  Abdominal: Soft. Bowel sounds are normal. There is no tenderness. There is no guarding.  Musculoskeletal: Normal range of motion. She exhibits no edema.  Neurological: She is alert and oriented to person, place, and time.  AAOx3, answering questions and following commands appropriately; equal strength UE and LE bilaterally; CN grossly intact; moves all extremities appropriately without ataxia; no focal neuro deficits or facial asymmetry appreciated  Skin: Skin is warm and dry. She is not diaphoretic.  Psychiatric: Her mood appears anxious.  Appears very anxious  Nursing note and vitals reviewed.   ED Course  Procedures (including critical care time) Labs Review Labs Reviewed  BASIC METABOLIC PANEL - Abnormal; Notable for the following:    CO2 21 (*)    Glucose, Bld 138 (*)    All other components within normal limits  CBC - Abnormal; Notable for the following:    WBC 12.3 (*)    All other components within normal limits  I-STAT TROPOININ, ED    Imaging Review Dg Chest 2 View  05/11/2016  CLINICAL DATA:  Palpitations EXAM: CHEST  2 VIEW COMPARISON:  05/04/2016 chest radiograph. FINDINGS: Stable cardiomediastinal silhouette with normal heart size. No pneumothorax. No pleural effusion. Lungs appear clear, with no acute consolidative airspace disease and no pulmonary edema. IMPRESSION:  No active cardiopulmonary disease. Electronically Signed   By: Ilona Sorrel M.D.   On: 05/11/2016 11:01   I have personally reviewed and evaluated these images and lab results as part of my medical decision-making.  ED ECG REPORT   Date: 05/11/2016  Rate: 101  Rhythm: sinus tachycardia with PVC's noted  QRS Axis: normal  Intervals: normal  ST/T Wave abnormalities: normal  Conduction Disutrbances:none  Narrative Interpretation:   Old EKG Reviewed: unchanged  I have personally reviewed the EKG tracing and agree with the computerized printout as noted.   MDM   Final diagnoses:  Dizziness  PVC's (premature ventricular contractions)  Chest pain, unspecified chest pain type   45 year old female here with dizziness, palpitations, an episode of chest pain that occurred yesterday. Patient reports long-standing history of vertigo. She was seen in the ED for this 3 days ago and had a negative MRI. She reports symptoms occur in the morning, lasting 20-30 minutes.  Meclizine does seem to help, however she only takes this once daily. Patient is afebrile, nontoxic. She is neurologically intact today.  She denies any current symptoms.  Her EKG is sinus tachycardia with PVCs noted which appear chronic for her.  Patient's labs today are reassuring. Her chest x-ray is clear. She has not had any chest pain since yesterday. Her vitals have remained stable.  Patient's dizziness and PVCs have been ongoing for the past several months.  She remains without active dizziness here in ED, however based on her history her symptoms seem consistent with vertigo. She did have a stroke in November 2016, however given her negative evaluation and MRI 3 days ago I do not feel this needs to be repeated at this time.  I had a long discussion with patient regarding her symptoms, she did not appear to understand that vertigo can be a recurring problem.  Patient will be d/c home to follow-up with her neurologist. She reports she has  an appointment scheduled for Monday. Continue Antivert as needed for dizziness.  Discussed plan with patient, he/she acknowledged understanding and agreed with plan of care.  Return precautions given for new or worsening symptoms.  Larene Pickett, PA-C 05/11/16 [place], MD 05/11/16 [phone]

## 2016-05-13 ENCOUNTER — Telehealth: Payer: Self-pay | Admitting: *Deleted

## 2016-05-13 NOTE — Telephone Encounter (Signed)
Pt want a sooner appt. Please advise [phone]

## 2016-05-13 NOTE — Telephone Encounter (Signed)
I called and # left, was spanish only.  Will try son?  I spoke to [employer].  He stated that she is not wanting to eat.  ? Medication.  The only thing that we prescribe is aspirin 325mg  po daily.  I stated that for that problem to see pcp, IM for eval.  ? Depression? Medications?  I relayed that she has appt with pcp 06-10-16.  Sharon Seller.  He verbalized understanding.   If needs to see [place] for neuro to please call us back.

## 2016-05-16 ENCOUNTER — Ambulatory Visit: Payer: Medicaid Other | Admitting: Cardiovascular Disease

## 2016-05-16 ENCOUNTER — Ambulatory Visit: Payer: Medicaid Other | Admitting: Nurse Practitioner

## 2016-05-16 ENCOUNTER — Telehealth: Payer: Self-pay | Admitting: Cardiovascular Disease

## 2016-05-16 DIAGNOSIS — I493 Ventricular premature depolarization: Secondary | ICD-10-CM

## 2016-05-16 NOTE — Telephone Encounter (Signed)
Patient walked in requesting a different medication in place of diltiazem 120mg  QD which was changed from bystolic 5mg  QD on 05/07/16 d/t patient's complaints of feeling anxious while on beta-blocker. She was noted to not tolerate metoprolol either, which was Rx'ed for management of PVCs. She states diltiazem makes her shake.  Spoke with Dr. Oval Linsey who did not recommend med changes unless patient felt she could tolerate bystolic or metoprolol better than diltiazem. She also states a referral to EP could be placed if patient was agreeable.   Spoke with patient/son (translating). Explained MD's advice. Patient would like to resume bystolic 5mg  and have EP consult.   Med list updated & referral ordered in EPIC.  MD notified of patient's preferences.

## 2016-05-22 ENCOUNTER — Encounter: Payer: Self-pay | Admitting: Hematology and Oncology

## 2016-05-22 ENCOUNTER — Telehealth: Payer: Self-pay | Admitting: Cardiovascular Disease

## 2016-05-22 ENCOUNTER — Ambulatory Visit (INDEPENDENT_AMBULATORY_CARE_PROVIDER_SITE_OTHER): Payer: Medicaid Other | Admitting: Pharmacist Clinician (PhC)/ Clinical Pharmacy Specialist

## 2016-05-22 VITALS — Ht 61.0 in | Wt 108.2 lb

## 2016-05-22 DIAGNOSIS — E785 Hyperlipidemia, unspecified: Secondary | ICD-10-CM | POA: Diagnosis not present

## 2016-05-22 DIAGNOSIS — I493 Ventricular premature depolarization: Secondary | ICD-10-CM

## 2016-05-22 NOTE — Assessment & Plan Note (Signed)
In having a 45 minute conversation with Carla [name] and her husband, I suspect that the lower leg and joint pain may be due to the atorvastatin.  I asked her to stop this for the next 3 weeks to see if the leg aches improve.  She is to call the office later in July and let us know.  If she is doing better we can consider a trial of ezetimibe for her cholesterol.

## 2016-05-22 NOTE — Assessment & Plan Note (Signed)
She has tried 3 different medications (metoprolol, nebivolol and diltiazem).  Of the 3, she believes the metoprolol caused her the least problems.  She felt dizziness often after taking nebivolol and she just didn't feel good with the diltiazem.  Will have her continue on with the metoprolol 12.5 mg bid and this was explained in Spanish multiple times.    I also asked her to contact her neurologist about trying a low dose aspirin to see if any of her abdominal/back pain issues may be related to full dose aspirin.  She denies any black stool or other signs of GI bleeding.    I also explained that the citalopram may cause her some drowsiness or dizziness for a few weeks, but if she can tolerate it, the medication will probably help with her anxieties.  Explained that it is a long-term fix and will not work if she just takes one tablet as needed.  Also encouraged her to try the megesterol daily to see if this will help her appetite and weight loss issues.    Patient was given drug information on metoprolol, megesterol, citalopram and aspirin in Spanish and admonished not stress over the lists of possible side effects, as most of these are rare.

## 2016-05-22 NOTE — Telephone Encounter (Signed)
Returned call. Patient had numerous medication related questions. Thinks she is having SEs from meds. She came in 1 week ago and was recommended by provider to discontinue diltiazem and restart her bystolic. She reports continued dizziness, fatigue, and shaking. It was not clear through my conversation with the patient whether she has discontinued the diltiazem as indicated. She is also having myalgias, which may or may not be related to lipitor. Pt's husband Roselie Awkward was put on phone and I spoke w him. He related that she has several bottles of medication, she is not sure if she is taking. Some are not on our list - among these, metoprolol, citalopram, ondansetron.  Due to difficulty of phone communication/language barrier, advised pt it wuld be beneficial to have her come in to office and see someone about medication clarification. I sought OK from [place] to add pt to a pharmD med mgmt visit today - she acknowledged availability. Pt added to calendar. I called back and confirmed appt details. Relayed instruction to come to the [place] office this afternoon @2 :30pm and to bring all medications to visit. Roselie Awkward acknowledged on behalf of patient.

## 2016-05-22 NOTE — Telephone Encounter (Signed)
Noted  

## 2016-05-22 NOTE — Patient Instructions (Signed)
Metoprolol 25 mg  - 1/2 pastilla 2 veces al dia Aspirin 325 mg - 1 pastilla cada dia Citalopram 10 mg - 1 pastilla en el noche Megesterol - 1 pastilla cada dia En 3 semanas, llama sorbe atorvastatin    Metoprolol tablets Qu es este medicamento? El METOPROLOL es un betabloqueante. Los betabloqueantes reducen la carga de [place] y lo ayudan a latir ms regularmente. Este medicamento se [place] para tratar la alta presin sangunea y para Theatre manager. Adems, puede ser til despus de un ataque cardiaco o para prevenir un ataque cardiaco adicional. Este medicamento puede ser utilizado para otros usos; si tiene alguna pregunta consulte con su proveedor de atencin mdica o con su farmacutico. Qu le debo informar a mi profesional de la salud antes de tomar este medicamento? Necesita saber si usted presenta alguno de los [employer] o situaciones: -diabetes -enfermedades cardiacas o vasculares, tales como frecuencia cardiaca lenta, empeoramiento de insuficiencia cardiaca, bloqueo cardiaco, sndrome del seno enfermo o enfermedad de [place] -enfermedad renal -enfermedad heptica -enfermedad pulmonar o respiratoria, como asma o enfisema -feocromocitoma -enfermedad tiroidea -una reaccin alrgica o inusual al metoprolol, a otros betabloqueantes, medicamentos, alimentos, colorantes o conservantes -si est embarazada o buscando quedar embarazada -si est amamantando a un beb Cmo debo utilizar este medicamento? Tome este medicamento por va oral con un vaso de agua. Siga las instrucciones de la etiqueta del [place]. Tome este medicamento inmediatamente despus de las comidas. Tome sus dosis a intervalos regulares. No tome su medicamento con una frecuencia mayor a la indicada. No suspenda este medicamento de [employer]. Puede causarle serios problemas cardiacos. Hable con su pediatra para informarse acerca del uso de este medicamento en nios. Puede  requerir atencin especial. Sobredosis: Pngase en contacto inmediatamente con un centro toxicolgico o una sala de urgencia si usted cree que haya tomado demasiado medicamento. ATENCIN: [employer] es solo para usted. No comparta este medicamento con nadie. Qu sucede si me olvido de una dosis? Si olvida una dosis, tmela lo antes posible. Si es casi la hora de la prxima dosis, tome slo esa dosis. No tome dosis adicionales o dobles. Qu puede interactuar con este medicamento? Esta medicina puede interactuar con los siguientes medicamentos: -ciertos medicamentos para la presin sangunea, enfermedad cardiaca, pulso cardiaco irregular -ciertos medicamentos para la depresin, tales como inhibidores de la monoamina oxidasa (IMAO), fluoxetina o paroxetina -clonidina -dobutamina -epinefrina -isoproterenol -reserpina Puede ser que esta lista no menciona todas las posibles interacciones. Informe a su profesional de KB Home	[place] de [employer] productos a base de hierbas, medicamentos de [place] o suplementos nutritivos que est tomando. Si usted fuma, consume bebidas alcohlicas o si utiliza drogas ilegales, indqueselo tambin a su profesional de KB Home	[place]. Algunas sustancias pueden interactuar con su medicamento. A qu debo estar atento al usar Coca-Cola? Visite a su mdico o a su profesional de la salud para chequear su evolucin peridicamente. Comunquese con su mdico de inmediato si sus sntomas empeoran. Controle su presin sangunea y su frecuencia cardiaca regularmente. Pregunte a su profesional de la salud cules deben ser su frecuencia cardiaca y su presin sangunea y cundo deber comunicarse con l/ella. Puede experimentar somnolencia o mareos. No conduzca ni utilice maquinaria, ni haga nada que Associate Professor en estado de alerta hasta que sepa cmo le afecta este medicamento. No se ponga de pie ni se siente rpidamente, especialmente si es una persona de edad avanzada.  Esto reduce el riesgo de mareos o [employer].  Comunquese con su mdico si estos sntomas continan. El alcohol puede interferir con el efecto del medicamento. Evite consumir bebidas alcohlicas. Qu efectos secundarios puedo tener al [employer] este medicamento? Efectos secundarios que debe informar a su mdico o a Barrister's clerk de la salud tan pronto como sea posible: -Chief of Staff como erupcin cutnea, picazn o urticarias -manos o pies fros o entumecidos -depresin -dificultad al respirar -desmayos -fiebre con dolor de garganta -latidos cardiacos irregulares, dolor en el pecho -aumento de peso rpido -piernas o tobillos hinchados Efectos secundarios que, por lo general, no requieren Geophysical data processor (debe informarlos a su mdico o a Barrister's clerk de la salud si persisten o si son molestos): -ansiedad o nerviosismo -cambios del deseo o capacidad sexual -piel seca -dolor de cabeza -pesadillas o dificultad para dormir -prdida de la memoria a corto plaza -Higher education careers adviser o diarrea -cansancio inusual Puede ser que esta lista no menciona todos los posibles efectos secundarios. Comunquese a su mdico por asesoramiento mdico [employer]. Usted puede informar los efectos secundarios a la FDA por telfono al 1-800-FDA-1088. Dnde debo guardar mi medicina? Mantngala fuera del alcance de los nios. Gurdela a [employer], entre 15 y 34 grados C (82 y 79 grados F). Deseche todo el medicamento que no haya utilizado, despus de la fecha de vencimiento. ATENCIN: Este folleto es un resumen. Puede ser que no cubra toda la posible informacin. Si usted tiene preguntas acerca de esta medicina, consulte con su mdico, su farmacutico o su profesional de Technical sales engineer.    2016, Elsevier/Gold Standard. (2015-01-03 00:00:00)  Megestrol tablets Qu es este medicamento? El MEGESTROL pertenece a un grupo de medicamentos denominados progesteronas. Las tabletas de  megestrol se utilizan en el tratamiento del cncer de mama y del cncer endometrial avanzado. Este medicamento puede ser utilizado para otros usos; si tiene alguna pregunta consulte con su proveedor de atencin mdica o con su farmacutico. Qu le debo informar a mi profesional de la salud antes de tomar este medicamento? Necesita saber si usted presenta alguno de los siguientes problemas o situaciones: -problemas de la glndula suprarrenal -antecedentes de problemas de coagulacin de la sangre en los vasos sanguneos de las piernas, pulmones u otras partes del cuerpo -diabetes -enfermedad renal -enfermedad heptica -derrame cerebral -una reaccin alrgica o inusual al megestrol, a otros medicamentos, alimentos, colorantes o conservantes -si est embarazada o buscando quedar embarazada -si est amamantando a un beb Cmo debo utilizar este medicamento? Tome este medicamento por va oral. Siga las instrucciones de la etiqueta del [place]. No tome su medicamento con una frecuencia mayor a la indicada. Tome sus dosis a intervalos regulares. No deje de tomarlo excepto si as lo indica su mdico o su profesional de KB Home	[place]. Hable con su pediatra para informarse acerca del uso de este medicamento en nios. Puede requerir atencin especial. Sobredosis: Pngase en contacto inmediatamente con un centro toxicolgico o una sala de urgencia si usted cree que haya tomado demasiado medicamento. ATENCIN: [employer] es solo para usted. No comparta este medicamento con nadie. Qu sucede si me olvido de una dosis? Si olvida una dosis, tmela lo antes posible. Si es casi la hora de la prxima dosis, tome slo esa dosis. No tome dosis adicionales o dobles. Qu puede interactuar con este medicamento? No tome esta medicina con ninguno de los siguientes medicamentos: -dofetilida Esta medicina tambin puede interactuar con los siguientes  medicamentos: -carbamazepina -indinavir -fenobarbital -fenitona -primidona -rifampicina -warfarina Puede ser que esta lista no menciona todas las posibles  interacciones. Informe a su profesional de KB Home	[place] de [employer] productos a base de hierbas, medicamentos de [place] o suplementos nutritivos que est tomando. Si usted fuma, consume bebidas alcohlicas o si utiliza drogas ilegales, indqueselo tambin a su profesional de KB Home	[place]. Algunas sustancias pueden interactuar con su medicamento. A qu debo estar atento al usar Coca-Cola? Visite a su mdico o a su profesional de la salud para chequear su evolucin peridicamente. Contine tomando este medicamento aun cuando se sienta mejor. Puede ser necesario que utilice este medicamento durante 2 meses en forma regular antes de saber si est actuando. Si es Art therapist y est en edad de Media planner, utilice un mtodo anticonceptivo eficaz mientras est tomando este medicamento. Las mujeres que estn embarazadas o amamantando a un beb, no deben Solicitor. Existe la posibilidad de efectos secundarios graves a un beb sin nacer o un lactante. Para ms informacin hable con su profesional de la salud o su Development worker, international aid. Si es diabtico, este medicamento puede afectar sus niveles de Dispensing optician. Chequear su nivel de azcar en la sangre y consulte a su mdico o su profesional de la salud si nota algn cambio. Qu efectos secundarios puedo tener al [employer] este medicamento? Efectos secundarios que debe informar a su mdico o a Barrister's clerk de la salud tan pronto como sea posible: -dificultad al respirar o falta de aliento -dolor en el pecho -mareos -retencin de lquidos -aumento de la presin sangunea -dolor o hinchazn en las piernas -nuseas y vmito -erupcin cutnea y picazn -debilidad Efectos secundarios que, por lo general, no requieren Geophysical data processor (debe informarlos a su mdico o a Barrister's clerk de la salud si  persisten o si son molestos): -problemas de sangrado menstrual importante -sofocos o enrojecimiento -aumento de apetito -cambios en estados de nimo -sudoracin -aumento de peso Puede ser que esta lista no menciona todos los posibles efectos secundarios. Comunquese a su mdico por asesoramiento mdico [employer]. Usted puede informar los efectos secundarios a la FDA por telfono al 1-800-FDA-1088. Dnde debo guardar mi medicina? Mantngala fuera del alcance de los nios. Gurdela a una temperatura ambiente controlada de entre 15 y 75 grados C (15 y 47 grados F). Protjala del calor. No la exponga a temperaturas superiores a los 40 grados C (104 grados F). Deseche los medicamentos que no haya utilizado, despus de la fecha de vencimiento. ATENCIN: Este folleto es un resumen. Puede ser que no cubra toda la posible informacin. Si usted tiene preguntas acerca de esta medicina, consulte con su mdico, su farmacutico o su profesional de Technical sales engineer.    2016, Elsevier/Gold Standard. (2015-01-03 00:00:00)  Aspirin capsules or tablets extended release Qu es este medicamento? La ASPIRINA es un analgsico. Genene Churn para tratar el dolor leve y la [place]. Este medicamento tambin se [place] como indicado por su mdico para prevenir y tratar ataques cardiacos, para prevenir derrames cerebrales y para tratar la artritis o inflamacin. Este medicamento puede ser utilizado para otros usos; si tiene alguna pregunta consulte con su proveedor de atencin mdica o con su farmacutico. Qu le debo informar a mi profesional de la salud antes de tomar este medicamento? Necesita saber si usted presenta alguno de los siguientes problemas o situaciones: -anemia -asma -problemas de sangrado -nios con varicela, gripe u otras infecciones virales -diabetes -gota -si consume bebidas alcohlicas con frecuencia -enfermedad renal -enfermedad heptica -nivel bajo de vitamina K -lupus -fuma  tabaco -lceras estomacales u otros problemas estomacales -una reaccin  alrgica o inusual a la aspirina, al colorante tartrazina, a otros medicamentos, colorantes o conservantes -si est embarazada o buscando quedar embarazada -si est amamantando a un beb Cmo debo utilizar este medicamento? Tome este medicamento por va oral con un vaso de agua. Siga las instrucciones de la etiqueta o del envase del medicamento. No mastique, triture ni corte este medicamento. Puede tomar este medicamento con o sin alimentos. Si le produce malestar estomacal, tmelo con alimentos. No tome su medicamento con una frecuencia mayor a la indicada. Hable con su pediatra para informarse acerca del uso de este medicamento en nios. Aunque este medicamento ha sido recetado a nios tan menores como de 12 aos de edad para condiciones selectivas, las precauciones se aplican. Los nios y adolescentes no deben usar este medicamento para tratar los sntomas de la varicela o gripe a menos que su mdico lo haya indicado. Los pacientes de ms de 65 aos de edad pueden presentar reacciones ms fuertes y Designer, industrial/product dosis [employer] Sobredosis: Pngase en contacto inmediatamente con un centro toxicolgico o una sala de urgencia si usted cree que haya tomado demasiado medicamento. ATENCIN: [employer] es solo para usted. No comparta este medicamento con nadie. Qu sucede si me olvido de una dosis? Si est tomando [employer] un horario regular y Charissa Bash dosis, tmela lo antes posible. Si es casi la hora de la prxima dosis, tome slo esa dosis. No tome dosis adicionales o dobles. Qu puede interactuar con este medicamento? No tome esta medicina con ninguno de los siguientes medicamentos: -cidofovir -quetorolac -probenecid Esta medicina tambin puede interactuar con los siguientes medicamentos: -alcohol -alendronato -subsalicilato de bismuto -flavocoxid -suplementos a base de hierbas que contienen altamisa, ajo,  jengibre, ginkgo biloba, castao de [place] -medicamentos para la diabetes o glaucoma, tales como acetazolamida, metazolamida -medicamentos para la gota -medicamentos utilizados para tratar o prevenir cogulos sanguneos, tales como enoxaparina, heparina, ticlopidina, warfarina -otras aspirinas o medicamentos tipo aspirina -los [place], medicamentos para Conservation officer, historic buildings o inflamacin, como ibuprofeno o naproxeno -pemetrexed -sulfinpirazona -vacuna viva contra la varicela Puede ser que esta lista no menciona todas las posibles interacciones. Informe a su profesional de KB Home	[place] de [employer] productos a base de hierbas, medicamentos de [place] o suplementos nutritivos que est tomando. Si usted fuma, consume bebidas alcohlicas o si utiliza drogas ilegales, indqueselo tambin a su profesional de KB Home	[place]. Algunas sustancias pueden interactuar con su medicamento. A qu debo estar atento al usar Coca-Cola? Si se est tratando por dolor, informe a su mdico o su profesional de la salud si el dolor persiste durante ms de 10 das, si [place] un dolor nuevo o de tipo diferente. Si observa enrojecimiento o hinchazn, informe a su mdico. Adems, consulte con su mdico si la fiebre persiste durante ms de 3 das. Slo tome este medicamento para prevenir ataques cardiacos o la coagulacin sangunea si se la receta su mdico o su profesional de [employer] Home	[place]. Evite tomar aspirina o medicamentos tipo aspirina con este medicamento. El tomar aspirina en exceso puede ser muy peligroso. Lea siempre las etiquetas cuidadosamente. Este medicamento puede irritar el estmago o causar problemas de sangrado. No fume ni ingiera alcohol mientras est tomando este medicamento. Para evitar que se le irrite la garganta, trate de no acostarse durante 30 minutos despus de tomar Coca-Cola. Si est programado cualquier procedimiento mdico o dental, informe a su proveedor de atencin mdica que est tomando [employer]. Puede ser necesario dejar de usar [place] [employer]  medicamento antes de un procedimiento. Este medicamentos se puede [place] para Agricultural engineer. Si toma medicamentos para migraas por 10 das o ms en un mes, las migraas se Scientist, research (medical). Mantenga un diario de das de dolor de [place] y el uso de [place]. Consulte a su profesional de la salud si los ataques de migraas ocurren con ms frecuencia. Qu efectos secundarios puedo tener al [employer] este medicamento? Efectos secundarios que debe informar a su mdico o a Barrister's clerk de la salud tan pronto como sea posible: -Chief of Staff como erupcin cutnea, picazn o urticarias, hinchazn de la cara, labios o lengua -problemas respiratorios -zumbido en los odos cambios en la audicin -confusin -sensacin general de estar enfermo o sntomas gripales -dolor para tragar -enrojecimiento, formacin de ampollas, descamacin o distensin de la piel, inclusive dentro de la boca o de la Lawyer -signos y sntomas de sangrado tales como heces de color oscuro, con sangre o con aspecto alquitranado; orina roja o marrn oscura; escupir sangre o material marrn que parece granos de caf; puntos rojos en la piel; sangrado o magulladuras inusuales de los ojos, encas o Lawyer -dificultad para Garment/textile technologist o cambios en el volumen de orina -cansancio o debilidad inusual -color amarillento de los ojos o la piel Efectos secundarios que, por lo general, no requieren atencin mdica (debe informarlos a su mdico o a Barrister's clerk de la salud si persisten o si son molestos): -diarrea o estreimiento -dolor de cabeza -nuseas, vmito -gases, acidez estomacal Puede ser que esta lista no menciona todos los posibles efectos secundarios. Comunquese a su mdico por asesoramiento mdico [employer]. Usted puede informar los efectos secundarios a la FDA por telfono al 1-800-FDA-1088. Dnde debo guardar mi medicina? Mantngala fuera del alcance  de los nios. Gurdela a [employer], entre 15 y 41 grados C (2 y 85 grados F). Protjala del calor y de la humedad. No utilice este medicamento si tiene un [employer] a vinagre. Deseche todo el medicamento que no haya utilizado, despus de la fecha de vencimiento. ATENCIN: Este folleto es un resumen. Puede ser que no cubra toda la posible informacin. Si usted tiene preguntas acerca de esta medicina, consulte con su mdico, su farmacutico o su profesional de Technical sales engineer.    2016, Elsevier/Gold Standard. (2015-01-04 00:00:00)  Citalopram tablets Qu es este medicamento? El CITALOPRAM es un medicamento para la depresin. Este medicamento puede ser utilizado para otros usos; si tiene alguna pregunta consulte con su proveedor de atencin mdica o con su farmacutico. Qu le debo informar a mi profesional de la salud antes de tomar este medicamento? Necesita saber si usted presenta alguno de los siguientes problemas o situaciones: -trastorno bipolar o antecedentes familiares del trastorno bipolar -diabetes -glaucoma -enfermedad cardiaca -antecedentes de pulso cardiaco irregular -enfermedad renal o heptica -niveles bajos de magnesio o potasio en la sangre -recibe tratamiento electroconvulsivo -convulsiones -ideas suicidas o intento de suicidio previo -una reaccin alrgica o inusual al citalopram, al escitalopram, otros medicamentos, alimentos, colorantes o conservadores -si est embarazada o buscando quedar embarazada -si est amamantando a un beb Cmo debo utilizar este medicamento? Tome este medicamento por va oral con un vaso de agua. Siga las instrucciones de la etiqueta del [place]. Este medicamento se puede tomar con o sin alimentos. Tome sus dosis a intervalos regulares. No tome su medicamento con una frecuencia mayor a la indicada. No deje de tomar Coca-Cola repentinamente a menos que as indique su mdico. El detener este medicamento demasiado rpido puede  causar  efectos secundarios graves o puede empeorar su condicin. Su farmacutico le dar una [place] del medicamento especial con cada receta y relleno. Asegrese de leer esta informacin cada vez cuidadosamente. Hable con su pediatra para informarse acerca del uso de este medicamento en nios. Puede requerir Sales executive. Los pacientes mayores de 60 aos de edad pueden presentar reacciones ms fuertes y Designer, industrial/product dosis [employer] Sobredosis: Pngase en contacto inmediatamente con un centro toxicolgico o una sala de urgencia si usted cree que haya tomado demasiado medicamento. ATENCIN: [employer] es solo para usted. No comparta este medicamento con nadie. Qu sucede si me olvido de una dosis? Si olvida una dosis, tmela lo antes posible. Si es casi la hora de la prxima dosis, tome slo esa dosis. No tome dosis adicionales o dobles. Qu puede interactuar con este medicamento? No tome esta medicina con ninguno de los siguientes medicamentos: -ciertos medicamentos para infecciones micticas como fluconazol, itraconazol, quetoconazol, posaconazol, voriconazol -cisapride -dofetilida -dronedarona -escitalopram -linezolid -IMAOs, tales como Carbex, Eldepryl, Marplan, Nardil y Parnate -azul de metileno (Arts administrator) -pimozida -tioridazina -ziprasidona Esta medicina tambin puede interactuar con los siguientes medicamentos: -alcohol -aspirina o medicamentos tipo aspirina -carbamazepina -ciertos medicamentos para la depresin, ansiedad o trastornos psicticos -ciertos medicamentos usados para tratar infecciones, tales como cloroquina, Ambulance person, eritromicina, pentamidina -ciertos medicamentos para migraas, tales como almotriptn, eletriptn, frovatriptn, naratriptn, rizatriptn, sumatriptn, zolmitriptn -ciertos medicamentos para dormir -ciertos medicamentos que tratan o previenen cogulos sanguneos, como warfarina, enoxaparina,  dalteparina -cimetidina -diurticos -fentanilo -litio -metadiba -metoprolol -los [place], medicamentos para el dolor o inflamacin, tales como ibuprofeno o naproxeno -omeprazol -otros medicamentos que prolongan el intervalo QT (causa un ritmo cardiaco anormal) -procarbazina -rasagilina -suplementos como hierba de [place], kava kava, valeriana -tramadol -triptfano Puede ser que esta lista no menciona todas las posibles interacciones. Informe a su profesional de KB Home	[place] de [employer] productos a base de hierbas, medicamentos de [place] o suplementos nutritivos que est tomando. Si usted fuma, consume bebidas alcohlicas o si utiliza drogas ilegales, indqueselo tambin a su profesional de KB Home	[place]. Algunas sustancias pueden interactuar con su medicamento. A qu debo estar atento al usar Coca-Cola? Informe a su mdico si sus sntomas no mejoran o si empeoran. Visite a su mdico o a su profesional de la salud para chequear su evolucin peridicamente. Debido que puede ser necesario tomar este medicamento durante varias semanas para que sea posible observar sus efectos en forma [place], es importante que sigue su tratamiento como recetado por su mdico. Los pacientes y sus familias deben estar atentos si empeora la depresin o ideas suicidas. Tambin est atento a cambios repentinos o severos de emocin, tales como el sentirse ansioso, agitado, lleno de pnico, irritable, hostil, agresivo, impulsivo, inquietud severa, demasiado excitado y hiperactivo o dificultad para conciliar el sueo. Si esto ocurre, especialmente al comenzar con el tratamiento o al cambiar de dosis, comunquese con su profesional de KB Home	[place]. Puede experimentar somnolencia o mareos. No conduzca ni utilice maquinaria, ni haga nada que Associate Professor en estado de alerta hasta que sepa cmo le afecta este medicamento. No se siente ni se ponga de pie con rapidez, especialmente si es un paciente de edad avanzada. Esto  reduce el riesgo de mareos o [employer]. El alcohol puede interferir con el efecto de este medicamento. Evite consumir bebidas alcohlicas. Se le podr secar la boca. Masticar chicle sin azcar, chupar caramelos duros y beber agua en abundancia le ayudar a mantener la boca hmeda. Qu efectos secundarios puedo tener al [employer]  este medicamento? Efectos secundarios que debe informar a su mdico o a Barrister's clerk de la salud tan pronto como sea posible: -Chief of Staff como erupcin cutnea, picazn o urticarias, hinchazn de la cara, labios o lengua -dolor en el pecho -confusin -mareos -pulso cardiaco rpido, irregular -habla rpidamente y presenta acciones o sentimientos agitados y fuera de control -sensacin de Youth worker o aturdimiento, cadas -alucinaciones, prdida del contacto con la realidad -convulsiones -falta de aliento -ideas suicidas u otros cambios de estado de nimo -sangrado o magulladuras inusuales Efectos secundarios que, por lo general, no requieren Geophysical data processor (debe informarlos a su mdico o a su profesional de la salud si persisten o si son molestos): -visin borrosa -cambios en el apetito -cambios en el deseo sexual o capacidad -dolor de cabeza -aumento de la sudoracin -nuseas -dificultad para dormir Puede ser que esta lista no menciona todos los posibles efectos secundarios. Comunquese a su mdico por asesoramiento mdico [employer] Usted puede informar los efectos secundarios a la FDA por telfono al 1-800-FDA-1088. Dnde debo guardar mi medicina? Mantngala fuera del alcance de los nios. Gurdela a [employer], entre 15 y 82 grados C (98 y 87 grados F). Deseche todo el medicamento que no haya utilizado, despus de la fecha de vencimiento. ATENCIN: Este folleto es un resumen. Puede ser que no cubra toda la posible informacin. Si usted tiene preguntas acerca de esta medicina, consulte con su mdico, su farmacutico o su  profesional de Technical sales engineer.    2016, Elsevier/Gold Standard. (2015-01-03 00:00:00)

## 2016-05-22 NOTE — Progress Notes (Signed)
05/22/2016 Carla [name] [birth year] [id number]   HPI:  Carla [name] is a 45 y.o. female patient of Dr Oval Linsey who presents today with concerns about her cholesterol and other medications.  She is from [place] and is here with her husband, who speaks some English, as well as an Astronomer.  She has been having complaints for several months, but feels as though nobody is paying attention to her concerns.  She complains of pain around her abdomen at about the diaphragm level, as well as aches in her knees and calves.  She also has some chronic anxiety and was prescribed citalopram 10 mg, but only took one dose and when did not notice any response, quit taking.  Also was prescribed megesterol to help with her unexplained weight loss.   She comes in today with about 10-12 pill bottles, although only her med list only shows 5 active medications.  She has switched up several types of medications enough times that we are not really sure what she is taking.    She was originally started on metoprolol 12.5 mg bid for her frequent PVC's.  This was switched to Bystolic, then diltiazem then back to Bystolic over the past month.  Not sure if she has actually been taking any of these.  She does not notice any irregular beats, but does feel a strong pounding of her heart that lasts for a few minutes.  This happens no more than once daily, although she will notice it most days of the week.    Current Medications: atorvastatin 20 mg qd, aspirin 325 mg qd, ??  Risk Factors: CVA November 2016, unexplained weight loss (may be due to continuous mid-torso pain?)  Diet:  Not eating well, has had a few episodes of nausea/vomiting.  States that the ondansetron actually made her hurt worse.  It was explained that her increased pain around her lower ribs may have just been muscle strain from the vomiting.    Current Outpatient Prescriptions  Medication Sig Dispense Refill  . aspirin 325 MG tablet Take 1 tablet (325 mg  total) by mouth daily. 30 tablet 2  . atorvastatin (LIPITOR) 10 MG tablet Take 10 mg by mouth daily.    . meclizine (ANTIVERT) 25 MG tablet Take 1 tablet (25 mg total) by mouth 3 (three) times daily as needed for dizziness. 15 tablet 0  . megestrol (MEGACE) 400 MG/10ML suspension Take 10 mLs (400 mg total) by mouth daily. 240 mL 0  . nebivolol (BYSTOLIC) 5 MG tablet Take 5 mg by mouth daily.    . [DISCONTINUED] pravastatin (PRAVACHOL) 40 MG tablet Take 1 tablet (40 mg total) by mouth daily. 30 tablet 2  . [DISCONTINUED] zolpidem (AMBIEN) 5 MG tablet Take 1 tablet (5 mg total) by mouth at bedtime as needed for sleep. 15 tablet 0   No current facility-administered medications for this visit.    Allergies  Allergen Reactions  . Atorvastatin Other (See Comments)    Muscle aches  . Tramadol Nausea Only    Past Medical History  Diagnosis Date  . Stroke ([place])     09/2015  . Hyperlipidemia   . 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    Tommy Medal PharmD CPP [facility]

## 2016-05-22 NOTE — Telephone Encounter (Signed)
New Message  Pt calling to speak w/ RN to see  if she can fly with her condition. Please call back and discuss.

## 2016-06-04 ENCOUNTER — Encounter: Payer: Medicaid Other | Admitting: Cardiology

## 2016-06-04 NOTE — Progress Notes (Signed)
This encounter was created in error - please disregard.

## 2016-06-05 ENCOUNTER — Encounter: Payer: Self-pay | Admitting: Cardiology

## 2016-06-06 ENCOUNTER — Ambulatory Visit: Payer: Medicaid Other | Admitting: Hematology and Oncology

## 2016-06-10 ENCOUNTER — Ambulatory Visit: Payer: Medicaid Other | Admitting: Family Medicine

## 2016-06-11 ENCOUNTER — Ambulatory Visit: Payer: Medicaid Other | Admitting: Cardiovascular Disease

## 2016-06-17 ENCOUNTER — Other Ambulatory Visit: Payer: Self-pay

## 2016-06-17 MED ORDER — NEBIVOLOL HCL 5 MG PO TABS: 5.0000 mg | ORAL_TABLET | Freq: Every day | ORAL | 0 refills | Status: DC

## 2016-06-19 ENCOUNTER — Ambulatory Visit: Payer: Medicaid Other | Admitting: Cardiovascular Disease

## 2016-06-20 ENCOUNTER — Encounter: Payer: Self-pay | Admitting: *Deleted

## 2016-06-26 ENCOUNTER — Ambulatory Visit: Payer: Medicaid Other | Admitting: Nurse Practitioner

## 2016-10-31 ENCOUNTER — Ambulatory Visit: Payer: Medicaid Other | Admitting: Nurse Practitioner

## 2016-11-01 ENCOUNTER — Encounter: Payer: Self-pay | Admitting: Nurse Practitioner

## 2017-05-31 IMAGING — MR MR ABDOMEN WO/W CM
9 of 17 series · 22 of 48 positions shown · IV contrast (multihance)
Comparison: None.

CLINICAL DATA: Multiple liver lesions on CTA chest, favor
hemangiomas

EXAM:
MRI ABDOMEN WITHOUT AND WITH CONTRAST
TECHNIQUE: Multiplanar multisequence MR imaging of the abdomen was performed
both before and after the administration of intravenous contrast.
CONTRAST:  13mL MULTIHANCE GADOBENATE DIMEGLUMINE 529 MG/ML IV SOLN

[Series 3: T2 · coronal · 5.0mm · 0.70mm/px · 2 of 36 slices shown (1 of 2)]
[im 1/36]
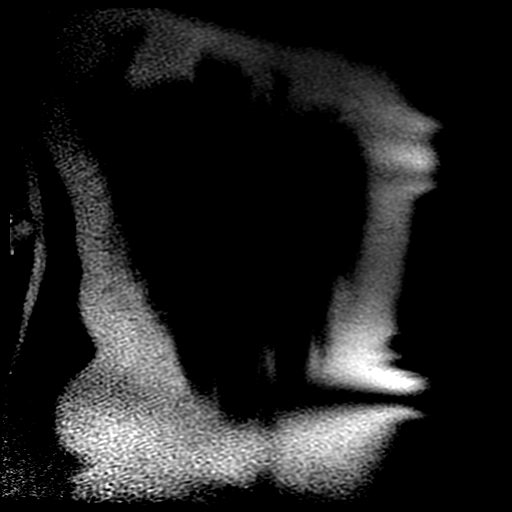
[im 36/36]
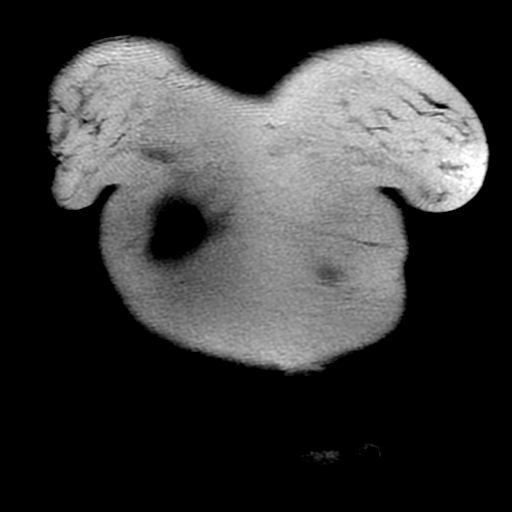

[Series 4: T2 · axial · 5.0mm · 0.74mm/px · z∈[-59,+145]mm · 3 of 42 slices shown (2 of 2)]
[im 1/42]
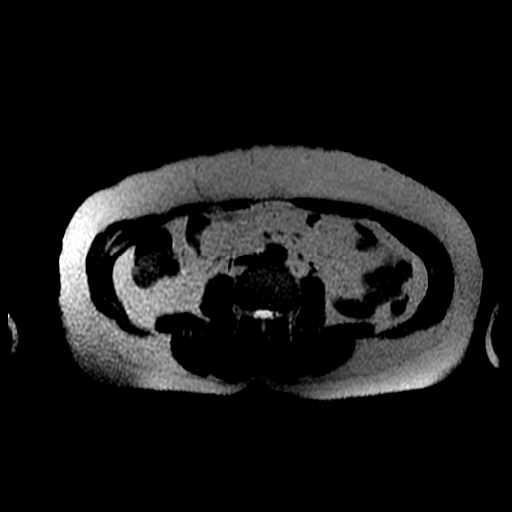
[im 21/42]
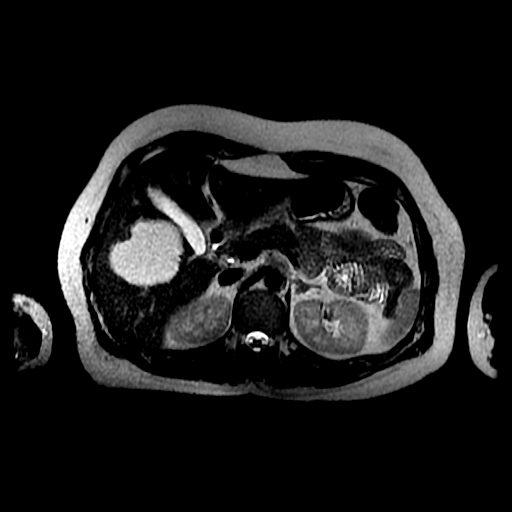
[im 42/42]
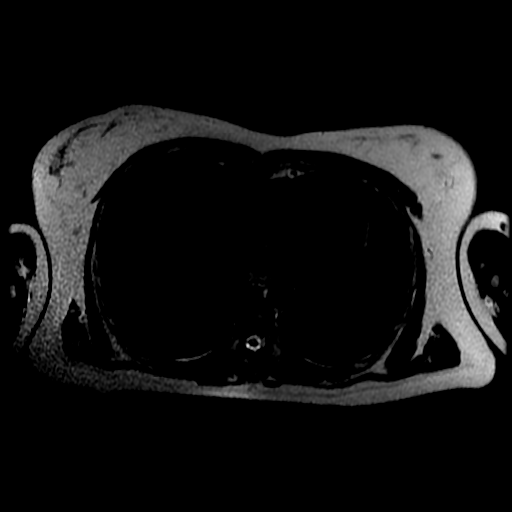

[Series 5: T2 fat-sat · axial · 5.0mm · 0.74mm/px · z∈[-59,+145]mm · 2 of 42 slices shown]
[im 1/42]
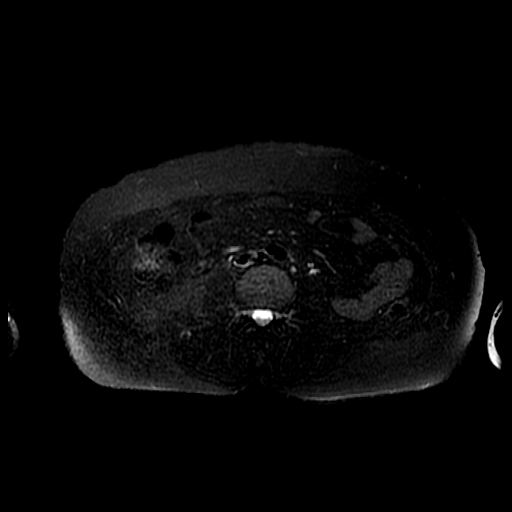
[im 42/42]
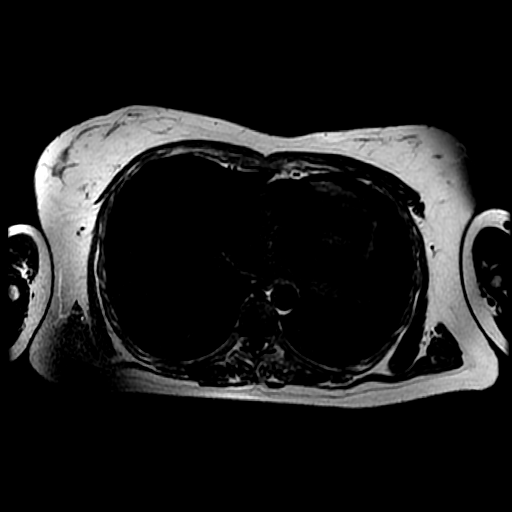

[Series 6: DWI b500 · axial · 5.0mm · 1.48mm/px · z∈[-59,+145]mm · 3 of 83 slices shown]
[im 1/83]
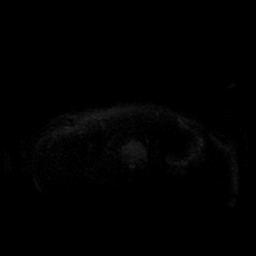
[im 42/83]
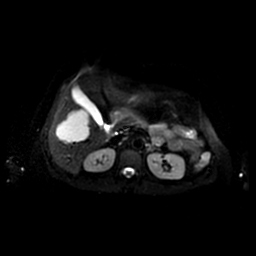
[im 83/83]
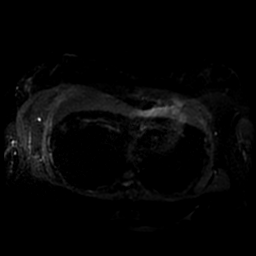

[Series 11: ax dualecho · axial · 5.0mm · 0.74mm/px · z∈[-53,+151]mm · 3 of 84 slices shown]
[im 1/84]
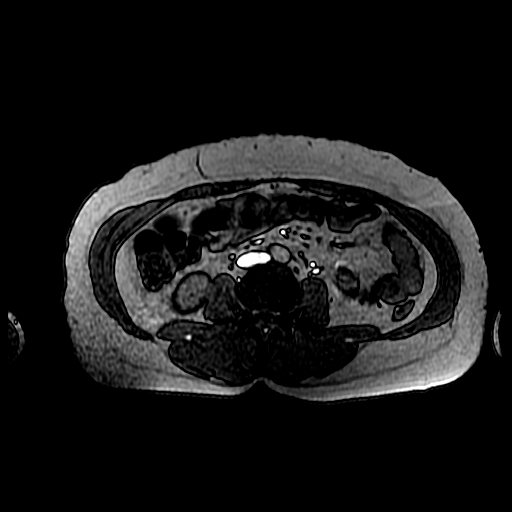
[im 42/84]
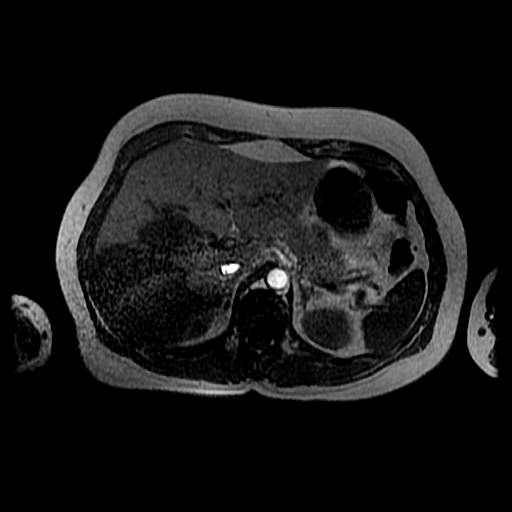
[im 84/84]
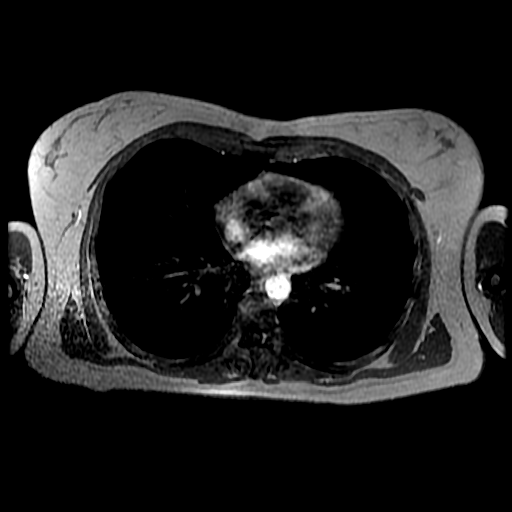

[Series 13: T1 dynamic post-contrast · coronal · 5.0mm · 0.70mm/px · 3 of 88 slices shown]
[im 1/88]
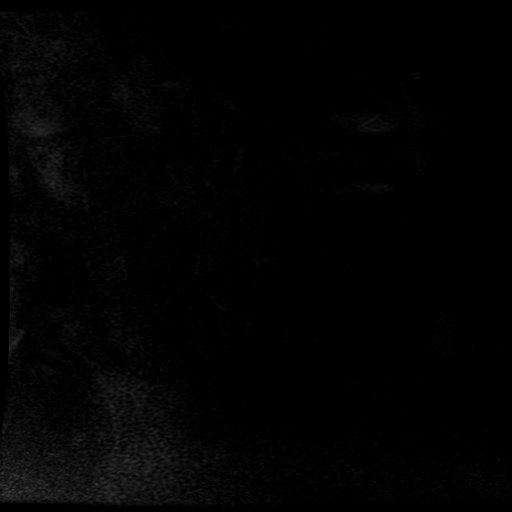
[im 44/88]
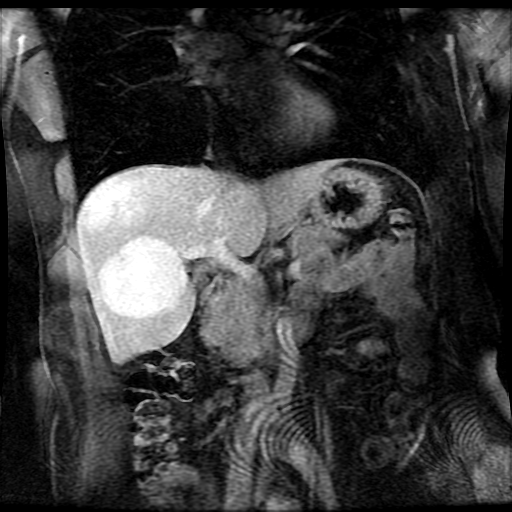
[im 88/88]
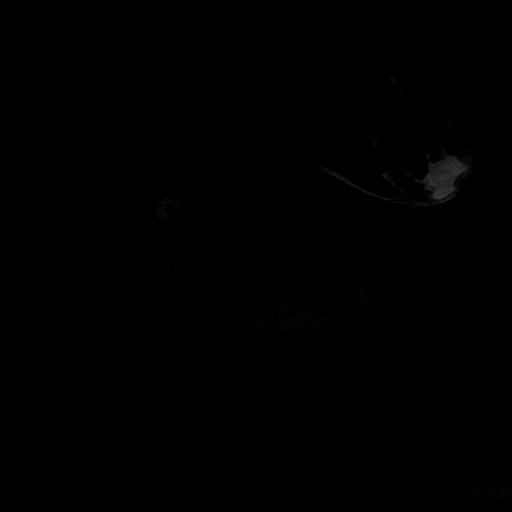

[Series 14: T1 dynamic · axial · 5.0mm · 0.74mm/px · z∈[-79,+148]mm · 3 of 92 slices shown (1 of 2)]
[im 1/92]
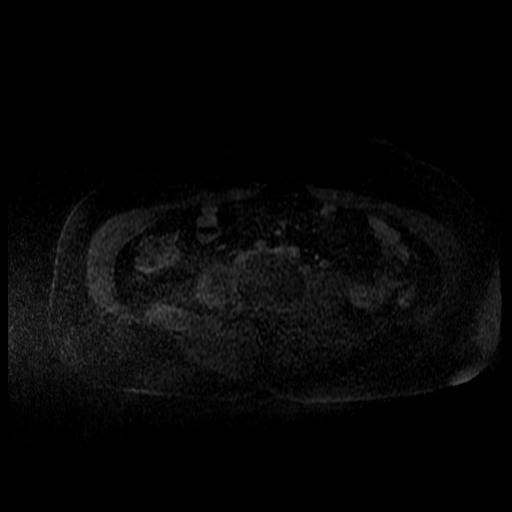
[im 46/92]
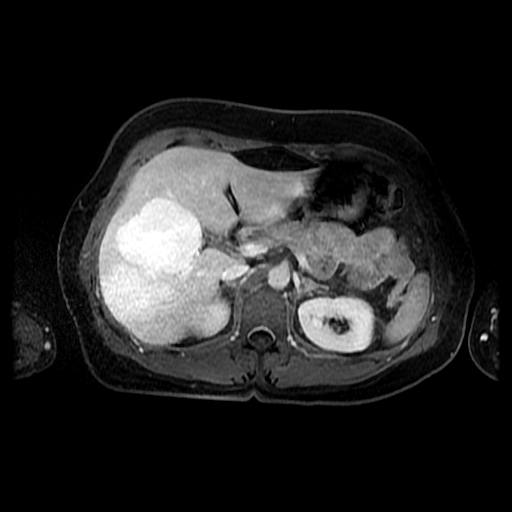
[im 92/92]
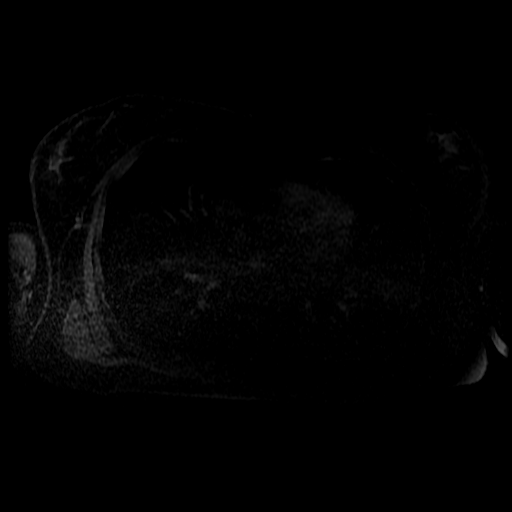

[Series 600: DWI · axial · 5.0mm · 1.48mm/px · z∈[-59,+145]mm · 2 of 42 slices shown]
[im 1/42]
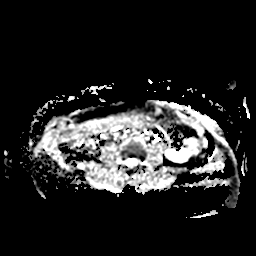
[im 42/42]
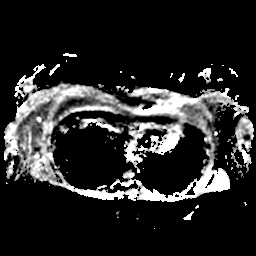

[Series 1200: T1 dynamic · axial · 5.0mm · 0.74mm/px · 1 of 92 slices shown (2 of 2)]
[im 1/92]
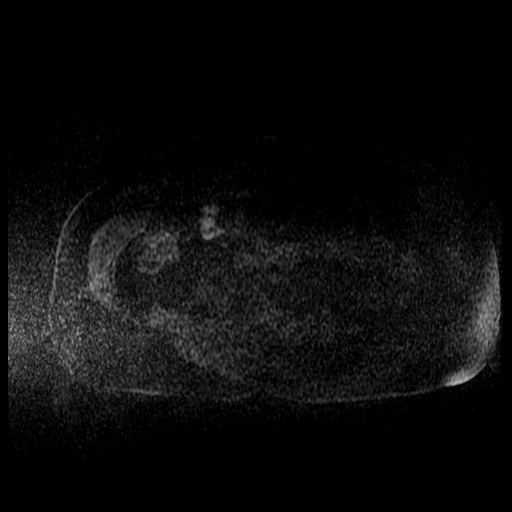

[22 of 48 positions shown; findings below may reference images not displayed]

FINDINGS: Lower chest:  Lung bases are clear.

Hepatobiliary: No hepatic steatosis. 5.5 x 6.3 x 5.8 cm T2
hyperintense lesion with peripheral nodular discontinuous
enhancement in the anterior segment right hepatic lobe (series
5/image 19), compatible with a benign hepatic hemangioma. Additional
6.8 x 9.9 x 7.7 cm T2 hyperintense lesion with peripheral nodular
discontinuous enhancement in the posterior segment right hepatic
lobe (series 5/image 11), compatible with a benign hepatic
hemangioma. No suspicious hepatic lesions.

Gallbladder is unremarkable. No intrahepatic or extrahepatic ductal
dilatation.

Pancreas: Within normal limits.

Spleen: Within normal limits.

Adrenals/Urinary Tract: Adrenal glands are within normal limits.

Kidneys are within normal limits.  No hydronephrosis.

Stomach/Bowel: Stomach and visualized bowel are unremarkable.

Vascular/Lymphatic: No evidence abdominal aortic aneurysm.

No suspicious abdominal lymphadenopathy.

Other: No abdominal ascites.

Musculoskeletal: Visualized thoracolumbar spine is within normal
limits.
IMPRESSION: Two benign cavernous hemangiomas in the right hepatic lobe,
measuring 9.9 and 6.3 cm, as described above.

No suspicious hepatic lesions.

## 2017-11-26 ENCOUNTER — Emergency Department (HOSPITAL_COMMUNITY): Payer: Self-pay

## 2017-11-26 ENCOUNTER — Emergency Department (HOSPITAL_COMMUNITY)
Admission: EM | Admit: 2017-11-26 | Discharge: 2017-11-26 | Disposition: A | Discharge status: Home / Self Care | Payer: Self-pay | Attending: Emergency Medicine | Admitting: Emergency Medicine

## 2017-11-26 ENCOUNTER — Encounter (HOSPITAL_COMMUNITY): Payer: Self-pay | Admitting: Emergency Medicine

## 2017-11-26 ENCOUNTER — Other Ambulatory Visit: Payer: Self-pay

## 2017-11-26 DIAGNOSIS — Z8673 Personal history of transient ischemic attack (TIA), and cerebral infarction without residual deficits: Secondary | ICD-10-CM | POA: Insufficient documentation

## 2017-11-26 DIAGNOSIS — M25562 Pain in left knee: Secondary | ICD-10-CM | POA: Insufficient documentation

## 2017-11-26 DIAGNOSIS — Z7982 Long term (current) use of aspirin: Secondary | ICD-10-CM | POA: Insufficient documentation

## 2017-11-26 DIAGNOSIS — Z79899 Other long term (current) drug therapy: Secondary | ICD-10-CM | POA: Insufficient documentation

## 2017-11-26 MED ORDER — IBUPROFEN 800 MG PO TABS: 800.0000 mg | ORAL_TABLET | Freq: Three times a day (TID) | ORAL | 0 refills | Status: DC | PRN

## 2017-11-26 NOTE — ED Provider Notes (Signed)
Emergency Department Provider Note   I have reviewed the triage vital signs and the nursing notes.   HISTORY  Chief Complaint Knee Pain   HPI Carla [name] is a 47 y.o. female with PMH of HLD since to the emergency department for evaluation of left knee pain.  Symptoms began 3 weeks ago and have persisted.  Patient works as a Engineer, building services and states pain is worse with walking or bending down at work.  No injury to the knee.  No swelling.  No other modifying factors or radiation.  The patient has not been trying over-the-counter pain medications.  No numbness or weakness in the lower extremity.   Past Medical History:  Diagnosis Date  . 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 Hyperlipidemia   . Stroke [facility])    09/2015    Patient Active Problem List   Diagnosis Date Noted  . 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 Premature ventricular contractions 03/25/2016  . Hyperlipidemia 03/25/2016  . Benign paroxysmal positional vertigo 03/11/2016  . Neck pain on right side 02/19/2016  . Cryptogenic stroke ([place]) 12/28/2015  . Multiple hemangiomas   . Cerebral infarction due to unspecified mechanism   . 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 Migraines 10/11/2015    Past Surgical History:  Procedure Laterality Date  . RIGHT ARM SURGERY    . TEE WITHOUT CARDIOVERSION N/A 10/13/2015   Procedure: TRANSESOPHAGEAL ECHOCARDIOGRAM (TEE);  Surgeon: Lelon Perla, MD;  Location: [facility] ENDOSCOPY;  Service: Cardiovascular;  Laterality: N/A;    Current Outpatient Rx  . Order #: [id number] Class: Normal  . Order #: [id number] Class: Historical Med  . Order #: [id number] Class: Print  . Order #: [id number] Class: Print  . Order #: [id number] Class: Normal  . Order #: [id number] Class: Normal    Allergies Atorvastatin and Tramadol  Family History  Problem Relation Age of Onset  . Leukemia Mother   . Asthma Neg Hx   . Cancer Neg Hx   . Diabetes Neg Hx   . Hyperlipidemia Neg Hx   . Heart failure Neg Hx   .  Hypertension Neg Hx   . Migraines Neg Hx   . Rashes / Skin problems Neg Hx   . Seizures Neg Hx   . Stroke Neg Hx   . Thyroid disease Neg Hx     Social History Social History   Tobacco Use  . Smoking status: Never Smoker  . Smokeless tobacco: Never Used  Substance Use Topics  . Alcohol use: No  . Drug use: No    Review of Systems  Constitutional: No fever/chills Eyes: No visual changes. ENT: No sore throat. Cardiovascular: Denies chest pain. Respiratory: Denies shortness of breath. Gastrointestinal: No abdominal pain.  No nausea, no vomiting.  No diarrhea.  No constipation. Genitourinary: Negative for dysuria. Musculoskeletal: Negative for back pain. Positive left knee pain. Skin: Negative for rash. Neurological: Negative for headaches, focal weakness or numbness.  10-point ROS otherwise negative.  ____________________________________________   PHYSICAL EXAM:  VITAL SIGNS: ED Triage Vitals  Enc Vitals Group     BP 11/26/17 1414 135/83     Pulse Rate 11/26/17 1414 88     Resp 11/26/17 1414 12     Temp 11/26/17 1414 98.4 F (36.9 C)     Temp Source 11/26/17 1414 Oral     SpO2 11/26/17 1414 100 %     Pain Score 11/26/17 1413 8   Constitutional: Alert and oriented. Well appearing and in no acute distress. Eyes: Conjunctivae are normal. Head: Atraumatic.  Nose: No congestion/rhinnorhea. Mouth/Throat: Mucous membranes are moist.  Neck: No stridor.  Cardiovascular: Normal rate, regular rhythm. Good peripheral circulation. Grossly normal heart sounds.   Respiratory: Normal respiratory effort.  No retractions. Lungs CTAB. Gastrointestinal: Soft and nontender. No distention.  Musculoskeletal: No lower extremity tenderness nor edema. Left knee is well-appearing with no effusion. No ankle tenderness. Normal gait.  Neurologic:  Normal speech and language. No gross focal neurologic deficits are appreciated.  Skin:  Skin is warm, dry and intact. No rash  noted. ____________________________________________  RADIOLOGY  Dg Knee Complete 4 Views Left  Result Date: 11/26/2017 CLINICAL DATA:  Intermittent generalized left knee pain for a few months, worse in the past 3 weeks. EXAM: LEFT KNEE - COMPLETE 4+ VIEW COMPARISON:  None. FINDINGS: No fracture, dislocation, or definite knee joint effusion is identified. Joint space widths are preserved without significant arthropathic changes seen. The soft tissues are unremarkable. IMPRESSION: Negative. Electronically Signed   By: Logan Bores M.D.   On: 11/26/2017 15:30    ____________________________________________   PROCEDURES  Procedure(s) performed:   Procedures  None ____________________________________________   INITIAL IMPRESSION / ASSESSMENT AND PLAN / ED COURSE  Pertinent labs & imaging results that were available during my care of the patient were reviewed by me and considered in my medical decision making (see chart for details).  Evaluation of left knee pain for the past 3 weeks.  No obvious deformity, swelling, area of focal tenderness on exam.  Plain film from triage is negative for bony injury.  Plan for Motrin, supportive Ace wrap, follow-up with sports medicine as needed for any new or worsening symptoms.  At this time, I do not feel there is any life-threatening condition present. I have reviewed and discussed all results (EKG, imaging, lab, urine as appropriate), exam findings with patient. I have reviewed nursing notes and appropriate previous records.  I feel the patient is safe to be discharged home without further emergent workup. Discussed usual and customary return precautions. Patient and family (if present) verbalize understanding and are comfortable with this plan.  Patient will follow-up with their primary care provider. If they do not have a primary care provider, information for follow-up has been provided to them. All questions have been  answered.  ____________________________________________  FINAL CLINICAL IMPRESSION(S) / ED DIAGNOSES  Final diagnoses:  Acute pain of left knee    NEW OUTPATIENT MEDICATIONS STARTED DURING THIS VISIT:  Motrin 800 mg    Note:  This document was prepared using Dragon voice recognition software and may include unintentional dictation errors.  Nanda Quinton, MD Emergency Medicine    Long, [facility], MD 11/26/17 [phone]

## 2017-11-26 NOTE — ED Triage Notes (Signed)
Pt reports left knee pain x3 weeks, hx of knee pain in the past, never saw a doctor for it though, denies any falls or injury. Ambulatory to triage with no issues.

## 2017-11-26 NOTE — Discharge Instructions (Signed)

## 2018-01-08 ENCOUNTER — Encounter: Payer: Self-pay | Admitting: Family Medicine

## 2018-01-08 ENCOUNTER — Ambulatory Visit (INDEPENDENT_AMBULATORY_CARE_PROVIDER_SITE_OTHER): Payer: Self-pay | Admitting: Family Medicine

## 2018-01-08 DIAGNOSIS — M25562 Pain in left knee: Secondary | ICD-10-CM

## 2018-01-08 DIAGNOSIS — G8929 Other chronic pain: Secondary | ICD-10-CM

## 2018-01-08 NOTE — Progress Notes (Signed)
PCP: Carla Chapman, NP  Subjective:   HPI: Patient is a 47 y.o. female here for left knee pain.  Patient seen with interpreter. She reports for about 1-1.5 years she's had a little pain in anterior left knee and above the knee. No acute trauma or injury. Pain up to 10/10 and sharp. Pain is deep, worse with sitting and better with walking. Tried topical medications, alcohol rub. No swelling. She reported concern she has cancer as her brother had bone cancer. No skin changes, numbness.  Past Medical History:  Diagnosis Date  . 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 Hyperlipidemia   . Stroke [facility])    09/2015    Current Outpatient Medications on File Prior to Visit  Medication Sig Dispense Refill  . [DISCONTINUED] pravastatin (PRAVACHOL) 40 MG tablet Take 1 tablet (40 mg total) by mouth daily. 30 tablet 2  . [DISCONTINUED] zolpidem (AMBIEN) 5 MG tablet Take 1 tablet (5 mg total) by mouth at bedtime as needed for sleep. 15 tablet 0   No current facility-administered medications on file prior to visit.     Past Surgical History:  Procedure Laterality Date  . RIGHT ARM SURGERY    . TEE WITHOUT CARDIOVERSION N/A 10/13/2015   Procedure: TRANSESOPHAGEAL ECHOCARDIOGRAM (TEE);  Surgeon: Lelon Perla, MD;  Location: [facility] ENDOSCOPY;  Service: Cardiovascular;  Laterality: N/A;    Allergies  Allergen Reactions  . Atorvastatin Other (See Comments)    Muscle aches  . Tramadol Nausea Only    Social History   Socioeconomic History  . Marital status: Single    Spouse name: Not on file  . Number of children: Not on file  . Years of education: Not on file  . Highest education level: Not on file  Social Needs  . Financial resource strain: Not on file  . Food insecurity - worry: Not on file  . Food insecurity - inability: Not on file  . Transportation needs - medical: Not on file  . Transportation needs - non-medical: Not on file  Occupational History  . Not on file  Tobacco Use  .  Smoking status: Never Smoker  . Smokeless tobacco: Never Used  Substance and Sexual Activity  . Alcohol use: No  . Drug use: No  . Sexual activity: Yes    Birth control/protection: None  Other Topics Concern  . Not on file  Social History Narrative  . Not on file    Family History  Problem Relation Age of Onset  . Leukemia Mother   . Asthma Neg Hx   . Cancer Neg Hx   . Diabetes Neg Hx   . Hyperlipidemia Neg Hx   . Heart failure Neg Hx   . Hypertension Neg Hx   . Migraines Neg Hx   . Rashes / Skin problems Neg Hx   . Seizures Neg Hx   . Stroke Neg Hx   . Thyroid disease Neg Hx     BP 134/87   Pulse 92   Ht 5\' 5"  (1.651 m)   Wt 135 lb (61.2 kg)   BMI 22.47 kg/m   Review of Systems: See HPI above.     Objective:  Physical Exam:  Gen: NAD, comfortable in exam room  Left knee: No gross deformity, ecchymoses, swelling. No TTP. FROM with 5/5 strength. Negative ant/post drawers. Negative valgus/varus testing. Negative lachmanns. Negative mcmurrays, apleys, patellar apprehension. NV intact distally.  Right knee: No deformity. FROM with 5/5 strength. No tenderness to palpation. NVI distally.  Assessment & Plan:  1. Left knee pain - independently reviewed radiographs and no abnormalities.  Exam is reassuring as well.  We discussed options - she is going to get cone coverage.  Due to severity of pain and lack of improvement over 1-1.5 years will go ahead with MRI to further assess.  Discussed tylenol, topical medications, aleve/ibuprofen, supplements that may help, home exercises.

## 2018-01-08 NOTE — Assessment & Plan Note (Signed)
independently reviewed radiographs and no abnormalities.  Exam is reassuring as well.  We discussed options - she is going to get cone coverage.  Due to severity of pain and lack of improvement over 1-1.5 years will go ahead with MRI to further assess.  Discussed tylenol, topical medications, aleve/ibuprofen, supplements that may help, home exercises.

## 2018-01-08 NOTE — Patient Instructions (Signed)
Your knee exam and your x-rays are reassuring. Get the cone coverage and bring [place] the letter from this so we can go ahead with an MRI of this knee. These are the different medications you can take for this: Tylenol 500mg  1-2 tabs three times a day for pain. Capsaicin, aspercreme, or biofreeze topically up to four times a day may also help with pain. Some supplements that may help for arthritis: Boswellia extract, curcumin, pycnogenol Consider Ibuprofen 600mg  three times a day with food OR Aleve 1-2 tabs twice a day with food It's important that you continue to stay active. Straight leg raises, knee extensions 3 sets of 10 once a day (add ankle weight if these become too easy). Shoe inserts with good arch support may be helpful. Heat or ice 15 minutes at a time 3-4 times a day as needed to help with pain. Follow up will depend on the results.

## 2018-02-23 ENCOUNTER — Ambulatory Visit: Payer: Self-pay

## 2018-06-12 ENCOUNTER — Emergency Department (HOSPITAL_COMMUNITY): Payer: Self-pay

## 2018-06-12 ENCOUNTER — Emergency Department (HOSPITAL_COMMUNITY)
Admission: EM | Admit: 2018-06-12 | Discharge: 2018-06-12 | Disposition: A | Discharge status: Home / Self Care | Payer: Self-pay | Attending: Emergency Medicine | Admitting: Emergency Medicine

## 2018-06-12 ENCOUNTER — Encounter (HOSPITAL_COMMUNITY): Payer: Self-pay | Admitting: Emergency Medicine

## 2018-06-12 DIAGNOSIS — F4329 Adjustment disorder with other symptoms: Secondary | ICD-10-CM

## 2018-06-12 DIAGNOSIS — R42 Dizziness and giddiness: Secondary | ICD-10-CM | POA: Insufficient documentation

## 2018-06-12 DIAGNOSIS — Z634 Disappearance and death of family member: Secondary | ICD-10-CM

## 2018-06-12 DIAGNOSIS — F4321 Adjustment disorder with depressed mood: Secondary | ICD-10-CM | POA: Insufficient documentation

## 2018-06-12 LAB — BASIC METABOLIC PANEL
Anion gap: 7 (ref 5–15)
BUN: 9 mg/dL (ref 6–20)
CALCIUM: 9.2 mg/dL (ref 8.9–10.3)
CHLORIDE: 109 mmol/L (ref 98–111)
CO2: 25 mmol/L (ref 22–32)
CREATININE: 0.83 mg/dL (ref 0.44–1.00)
GFR calc Af Amer: >60 mL/min (ref >60)
GFR calc non Af Amer: >60 mL/min (ref >60)
Glucose, Bld: 128 mg/dL — ABNORMAL HIGH (ref 70–99)
Potassium: 3.9 mmol/L (ref 3.5–5.1)
Sodium: 141 mmol/L (ref 135–145)

## 2018-06-12 LAB — I-STAT TROPONIN, ED: TROPONIN I, POC: 0 ng/mL (ref 0.00–0.08)

## 2018-06-12 LAB — CBC
HCT: 42.4 % (ref 36.0–46.0)
Hemoglobin: 13.7 g/dL (ref 12.0–15.0)
MCH: 29.3 pg (ref 26.0–34.0)
MCHC: 32.3 g/dL (ref 30.0–36.0)
MCV: 90.6 fL (ref 78.0–100.0)
PLATELETS: 360 10*3/uL (ref 150–400)
RBC: 4.68 MIL/uL (ref 3.87–5.11)
RDW: 12.7 % (ref 11.5–15.5)
WBC: 9.8 10*3/uL (ref 4.0–10.5)

## 2018-06-12 LAB — I-STAT BETA HCG BLOOD, ED (MC, WL, AP ONLY): I-stat hCG, quantitative: <5 m[IU]/mL (ref <5)

## 2018-06-12 MED ORDER — MECLIZINE HCL 25 MG PO TABS: 25.0000 mg | ORAL_TABLET | Freq: Three times a day (TID) | ORAL | 0 refills | Status: DC | PRN

## 2018-06-12 NOTE — ED Notes (Signed)
Patient verbalizes understanding of discharge instructions. Opportunity for questioning and answers were provided. Armband removed by staff, pt discharged from ED ambulatory.   

## 2018-06-12 NOTE — ED Provider Notes (Signed)
MSE was initiated and I personally evaluated the patient and placed orders (if any) at  2:54 PM on June 12, 2018.  The patient appears stable so that the remainder of the MSE may be completed by another provider.  Patient placed in Quick Look pathway, seen and evaluated   Chief Complaint: dizziness  HPI:   Patient presents to ED for evaluation of dizziness and chest pain that began 1 week ago.  States that her brother passed away 2 weeks ago in her home country.  States that since then she feels like her health is also deteriorating.  Her brother passed away from a heart attack.  States that she feels dizzy while walking and feels as if the room is spinning. Denies any head injury or loss of consciousness.  Denies any blurry vision.  Denies any shortness of breath, recent surgeries, recent prolonged travel, OCP use, history of cancer, prior MI or PE.  ROS: Dizziness  Physical Exam:   Gen: No distress  Neuro: Awake and Alert  Skin: Warm    Focused Exam: PERRL. No facial asymmetry noted. Equal grip strength bilaterally. Normal finger to nose coordination bilaterally. Lungs CTAB. Tachycardic.   Initiation of care has begun. The patient has been counseled on the process, plan, and necessity for staying for the completion/evaluation, and the remainder of the medical screening examination    [name], PA-C 06/12/18 1457    Lajean Saver, MD 06/12/18 1526

## 2018-06-12 NOTE — ED Triage Notes (Signed)
Patient complains of dizziness and chest pain that started three days ago. Stroke two years ago. Patient alert, oriented, and ambulating independently with steady gait.

## 2018-06-12 NOTE — ED Provider Notes (Signed)
[facility] EMERGENCY DEPARTMENT Provider Note   CSN: [id number] Arrival date & time: 06/12/18  1434     History   Chief Complaint Chief Complaint  Patient presents with  . Dizziness    HPI Carla [name] is a 47 y.o. female with a h/o of cryptogenic stroke and hyperlipidemia who presents to the emergency department with a chief complaint of dizziness.  She states the dizziness will last for a few minutes before resolving.  She characterizes the dizziness as room spinning.  She reports intermittent dizziness for the last 3 days.  She states that yesterday she felt some tingling in her left great toe for several minutes.  She states that she did have a headache to the posterior right aspect of her head intimately for the last 3 days.  She denies numbness, weakness, slurred speech, visual changes, tinnitus, nausea, vomiting, or URI symptoms.  She states that this is not at all how her previous stroke presented.  She also reports that she has been feeling "shaky all over" and has not had an appetite for the last week.  She also reports poor sleeping.  She reports that she feels as if she has no energy " my heart feels like it forced to work".  She states that all of the symptoms began approximately 1 week ago.  She also endorses low mood.  She reports that her brother passed away on 06/26/23.  She denies SI, HI, or auditory or visual hallucinations.  She states that she has a good support system at home with her children and other family members.  She is not established with behavioral health.  No history of depression.  The history is provided by the patient. A language interpreter was used ([place]).    Past Medical History:  Diagnosis Date  . 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 Hyperlipidemia   . Stroke [facility])    09/2015    Patient Active Problem List   Diagnosis Date Noted  . Left knee pain 01/08/2018  . 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 Premature ventricular contractions 03/25/2016   . Hyperlipidemia 03/25/2016  . Benign paroxysmal positional vertigo 03/11/2016  . Neck pain on right side 02/19/2016  . Cryptogenic stroke ([place]) 12/28/2015  . Multiple hemangiomas   . Cerebral infarction due to unspecified mechanism   . 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 Migraines 10/11/2015    Past Surgical History:  Procedure Laterality Date  . RIGHT ARM SURGERY    . TEE WITHOUT CARDIOVERSION N/A 10/13/2015   Procedure: TRANSESOPHAGEAL ECHOCARDIOGRAM (TEE);  Surgeon: Lelon Perla, MD;  Location[facility] END[facility]COPY;  Service: Cardiovascular;  Laterality: N/A;     OB History   None      Home Medications    Prior to Admission medications   Medication Sig Start Date End Date Taking? Authorizing Provider  naproxen sodium (ALEVE) 220 MG tablet Take 220-440 mg by mouth 2 (two) times daily as needed (pain).   Yes [provider]  meclizine (ANTIVERT) 25 MG tablet Take 1 tablet (25 mg total) by mouth 3 (three) times daily as needed for dizziness. 06/12/18   Myca Perno A, PA-C    Family History Family History  Problem Relation Age of Onset  . Leukemia Mother   . Asthma Neg Hx   . Cancer Neg Hx   . Diabetes Neg Hx   . Hyperlipidemia Neg Hx   . Heart failure Neg Hx   . Hypertension Neg Hx   .  Migraines Neg Hx   . Rashes / Skin problems Neg Hx   . Seizures Neg Hx   . Stroke Neg Hx   . Thyroid disease Neg Hx     Social History Social History   Tobacco Use  . Smoking status: Never Smoker  . Smokeless tobacco: Never Used  Substance Use Topics  . Alcohol use: No  . Drug use: No     Allergies   Atorvastatin and Tramadol   Review of Systems Review of Systems  Constitutional: Negative for activity change, chills and fever.  HENT: Negative for congestion and sore throat.   Eyes: Negative for visual disturbance.  Respiratory: Negative for shortness of breath.   Cardiovascular: Negative for chest pain.  Gastrointestinal: Negative for abdominal  pain, diarrhea, nausea and vomiting.  Genitourinary: Negative for dysuria.  Musculoskeletal: Negative for back pain.  Skin: Negative for rash.  Allergic/Immunologic: Negative for immunocompromised state.  Neurological: Positive for dizziness and headaches. Negative for weakness and numbness.  Psychiatric/Behavioral: Positive for decreased concentration, dysphoric mood and sleep disturbance. Negative for agitation, behavioral problems, confusion, hallucinations, self-injury and suicidal ideas. The patient is not nervous/anxious and is not hyperactive.      Physical Exam Updated Vital Signs BP (!) 167/96   Pulse (!) 110   Temp 98.9 F (37.2 C) (Oral)   Resp (!) 26   LMP 04/29/2018   SpO2 100%   Physical Exam  Constitutional: She is oriented to person, place, and time. No distress.  HENT:  Head: Normocephalic.  Eyes: Conjunctivae are normal.  Neck: Normal range of motion. Neck supple. No JVD present.  Cardiovascular: Normal rate, regular rhythm, normal heart sounds and intact distal pulses. Exam reveals no gallop and no friction rub.  No murmur heard. Pulmonary/Chest: Effort normal. No stridor. No respiratory distress. She has no wheezes. She has no rales. She exhibits no tenderness.  Abdominal: Soft. Bowel sounds are normal. She exhibits no distension and no mass. There is no tenderness. There is no rebound and no guarding. No hernia.  Neurological: She is alert and oriented to person, place, and time.  GCS 15.  Alert and oriented x3.  Cranial nerves II through XII are grossly intact.  Finger-nose is intact bilaterally.  Normal shin to ankle bilaterally.  5 out of 5 strength against resistance of the bilateral upper and lower extremities.  No clonus.  Sensation is intact and symmetric throughout.  No pronator drift.  Symmetric tandem gait.  Skin: Skin is warm. Capillary refill takes less than 2 seconds. No rash noted. She is not diaphoretic. No pallor.  Psychiatric: Her behavior is  normal.  Nursing note and vitals reviewed.  ED Treatments / Results  Labs (all labs ordered are listed, but only abnormal results are displayed) Labs Reviewed  BASIC METABOLIC PANEL - Abnormal; Notable for the following components:      Result Value   Glucose, Bld 128 (*)    All other components within normal limits  CBC  I-STAT TROPONIN, ED  I-STAT BETA HCG BLOOD, ED ([facility], [facility], [facility] ONLY)    EKG EKG Interpretation  Date/Time:  Friday June 12 2018 14:47:15 EDT Ventricular Rate:  110 PR Interval:  170 QRS Duration: 78 QT Interval:  326 QTC Calculation: 441 R Axis:   63 Text Interpretation:  Sinus tachycardia Otherwise normal ECG PVCs no longer present compared to 2017 Confirmed by Sherwood Gambler [phone]) on 06/12/2018 7:22:00 PM   Radiology Dg Chest 2 View  Result Date: 06/12/2018 CLINICAL DATA:  Dizziness and chest pain for 3 days. Shortness of breath and chest discomfort. EXAM: CHEST - 2 VIEW COMPARISON:  Chest radiograph May 11, 2016 FINDINGS: Cardiomediastinal silhouette is normal. No pleural effusions or focal consolidations. Trachea projects midline and there is no pneumothorax. Soft tissue planes and included osseous structures are non-suspicious. IMPRESSION: Negative. Electronically Signed   By: Elon Alas M.D.   On: 06/12/2018 15:44    Procedures Procedures (including critical care time)  Medications Ordered in ED Medications - No data to display   Initial Impression / Assessment and Plan / ED Course  I have reviewed the triage vital signs and the nursing notes.  Pertinent labs & imaging results that were available during my care of the patient were reviewed by me and considered in my medical decision making (see chart for details).     47 year old female with a h/o of cryptogenic stroke and hyperlipidemia who presents to the emergency department with a chief complaint of dizziness.  Her symptoms sound vertiginous in nature.  No focal neurologic deficits on  exam.  Labs are reviewed and are reassuring.  Chest x-ray is negative.  Doubt ICH, SAH, central vertigo, or subdural hematoma.  We will discharge the patient with a prescription of meclizine.  The patient reports that her brother passed away on Jun 22, 2023.  It sounds as if she is having a major depressive episode.  She denies SI, HI, or auditory visual hallucinations.  She declines TTS consult at this time.  It is possible that her dizziness and headache may also be in part to her current depressive episode.  I discussed that I will give the patient a referral to [facility], which she is agreeable to.  I also discussed that she can return to the emergency department if she develops new or worsening symptoms.  Strict return precautions given.  She is hemodynamically stable and in no acute distress.  She is safe for discharge home at this time.  Final Clinical Impressions(s) / ED Diagnoses   Final diagnoses:  Dizziness  Complicated grief    ED Discharge Orders        Ordered    meclizine (ANTIVERT) 25 MG tablet  3 times daily PRN     06/12/18 2142       Revonda Menter A, PA-C 06/12/18 2324    Sherwood Gambler, MD 06/14/18 1029

## 2018-06-12 NOTE — Discharge Instructions (Signed)
Gracias por permitirme brindarle su atencin hoy en el departamento de emergencias.  Tu anlisis de [employer].  Puede llamar y Risk manager una cita de seguimiento en [facility] si necesita establecerse con un proveedor de Midwife.  Su nmero de telfono aparece [place].  [facility] tiene una clnica si sientes que tu dificultad para dormir, comer y tu estado de nimo estn empeorando.  Para los mareos, puede tomar 1 comprimido de Environmental health practitioner cada 8 horas segn sea necesario para los [employer].  Regrese al departamento de emergencias si desarrolla pensamientos de Occupational psychologist a s mismo o Airline pilot vida, Psychologist, educational de cabeza de su vida, dificultad respiratoria severa, dolor en el pecho, visin doble, nueva debilidad o entumecimiento, u otro nuevo, con respecto a los sntomas.  Thank you for allowing me to provide your care today in the emergency department.  Your blood work was reassuring.  You can call and schedule a follow-up appointment at [facility] health and wellness if you need to get established with a primary care provider.  Their phone number is listed above.  [facility] has a walk-in clinic if you feel that your difficulty with sleeping, eating, and your mood are worsening.  For the dizziness, you can take 1 tablet of meclizine every 8 hours as needed for dizziness.  Return to the emergency department if you develop thoughts of wanting to hurt yourself or injure life, the worst headache of your life, severe shortness of breath, chest pain, double vision, new weakness, or numbness, or other new, concerning symptoms.

## 2018-06-12 NOTE — ED Notes (Signed)
Pt very anxious r/t recent deaths in the family.

## 2018-06-12 NOTE — ED Notes (Signed)
Results reviewed.  No changes in acuity at this time 

## 2018-07-21 ENCOUNTER — Encounter: Payer: Self-pay | Admitting: Physician Assistant

## 2018-07-21 ENCOUNTER — Ambulatory Visit: Payer: Self-pay

## 2018-07-21 ENCOUNTER — Ambulatory Visit: Payer: Self-pay | Admitting: Physician Assistant

## 2018-07-21 VITALS — BP 142/88 | HR 90 | Temp 98.5°F | Ht 65.0 in | Wt 133.4 lb

## 2018-07-21 DIAGNOSIS — I493 Ventricular premature depolarization: Secondary | ICD-10-CM

## 2018-07-21 DIAGNOSIS — I10 Essential (primary) hypertension: Secondary | ICD-10-CM

## 2018-07-21 DIAGNOSIS — I639 Cerebral infarction, unspecified: Secondary | ICD-10-CM

## 2018-07-21 DIAGNOSIS — E785 Hyperlipidemia, unspecified: Secondary | ICD-10-CM

## 2018-07-21 DIAGNOSIS — G4452 New daily persistent headache (NDPH): Secondary | ICD-10-CM

## 2018-07-21 LAB — COMPREHENSIVE METABOLIC PANEL
ALBUMIN: 4.4 g/dL (ref 3.5–5.2)
ALK PHOS: 72 U/L (ref 39–117)
ALT: 25 U/L (ref 0–35)
AST: 20 U/L (ref 0–37)
BUN: 9 mg/dL (ref 6–23)
CHLORIDE: 103 meq/L (ref 96–112)
CO2: 29 mEq/L (ref 19–32)
Calcium: 9.4 mg/dL (ref 8.4–10.5)
Creatinine, Ser: 0.75 mg/dL (ref 0.40–1.20)
GFR: 87.76 mL/min (ref >60.00)
Glucose, Bld: 101 mg/dL — ABNORMAL HIGH (ref 70–99)
POTASSIUM: 4 meq/L (ref 3.5–5.1)
SODIUM: 138 meq/L (ref 135–145)
TOTAL PROTEIN: 7.7 g/dL (ref 6.0–8.3)
Total Bilirubin: 0.7 mg/dL (ref 0.2–1.2)

## 2018-07-21 LAB — LIPID PANEL
CHOLESTEROL: 203 mg/dL — AB (ref 0–200)
HDL: 60.8 mg/dL (ref >39.00)
LDL CALC: 120 mg/dL — AB (ref 0–99)
NonHDL: 141.91
Total CHOL/HDL Ratio: 3
Triglycerides: 109 mg/dL (ref 0.0–149.0)
VLDL: 21.8 mg/dL (ref 0.0–40.0)

## 2018-07-21 LAB — CBC WITH DIFFERENTIAL/PLATELET
Basophils Absolute: 0 10*3/uL (ref 0.0–0.1)
Basophils Relative: 0.6 % (ref 0.0–3.0)
EOS PCT: 0.7 % (ref 0.0–5.0)
Eosinophils Absolute: 0.1 10*3/uL (ref 0.0–0.7)
HCT: 42.4 % (ref 36.0–46.0)
HEMOGLOBIN: 14.1 g/dL (ref 12.0–15.0)
LYMPHS ABS: 1.8 10*3/uL (ref 0.7–4.0)
Lymphocytes Relative: 21.2 % (ref 12.0–46.0)
MCHC: 33.3 g/dL (ref 30.0–36.0)
MCV: 90 fl (ref 78.0–100.0)
MONO ABS: 0.4 10*3/uL (ref 0.1–1.0)
MONOS PCT: 5.4 % (ref 3.0–12.0)
NEUTROS PCT: 72.1 % (ref 43.0–77.0)
Neutro Abs: 6 10*3/uL (ref 1.4–7.7)
Platelets: 383 10*3/uL (ref 150.0–400.0)
RBC: 4.71 Mil/uL (ref 3.87–5.11)
RDW: 13.8 % (ref 11.5–15.5)
WBC: 8.3 10*3/uL (ref 4.0–10.5)

## 2018-07-21 MED ORDER — ATORVASTATIN CALCIUM 40 MG PO TABS: 40.0000 mg | ORAL_TABLET | Freq: Every day | ORAL | 1 refills | Status: DC

## 2018-07-21 MED ORDER — NEBIVOLOL HCL 5 MG PO TABS: 5.0000 mg | ORAL_TABLET | Freq: Every day | ORAL | 1 refills | Status: DC

## 2018-07-21 NOTE — Telephone Encounter (Signed)
Called pt and left message to call back to discuss symptoms. Pt will need Spanish Interpreter.

## 2018-07-21 NOTE — Progress Notes (Signed)
Carla [name] is a 47 y.o. female here for a new problem.  History of Present Illness:   Chief Complaint  Patient presents with  . New Patient (Initial Visit)  . Headache   Patient is here with in-person interpreter.  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 During her stroke work-up she had an echo performed that showed normal EF, grade 1 diastolic dysfunction and mild aortic valve regurgitation, mild LVH. She was maintained on ASA and pravastatin for secondary prevention. She went to a cardiologist after episode of PVCs in May 2017 and was started on Lopressor 12.5 mg BID. Holter monitor at that time showed 15% PVCs. She stopped her atorvastatin in May 2017 2/2 myalgias.  She had multiple episodes of "shakiness, dizziness, vertigo, palpitations and anxiety" over the course of the past two years. She states that she was trialed on multiple medications but at one point stopped all of her medications because she couldn't tell what medications were contributing to her symptoms.  She has been without all of her medication x 1.5 years. She used to have Medicaid but lost it and is now uninsured.  On 28-Jun-2023 her brother died from MI. She went to the ED on 06/12/18 because she was having dizziness. Work-up at that time revealed complicated grieving and dizziness likely. EKG was normal. A referral was given for [facility], she states that she called and left a voicemail but never saw a provider. She states that since her hospital visit to the ED on 06/12/18 she has had dull and constant HA, intermittent chest pain and delayed speech. Since that time, she has HA "all day, every day", she has "warmth" to her forehead and cheeks. Has been taking 2 Aleve daily to help with her symptoms. Chest pain is absent when she is active, sometimes present when she sits still. Describes it as left-sided and feels like "there is a small hole in my lung and the air is slowly coming out." Has had changes in her speech,  states that she doesn't feel like she is completing her words when speaking. Has felt off balance and had pre-syncope.  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  Weight -- Weight: 133 lb 6.4 oz (60.5 kg)   Depression screen [facility] 2/9 07/21/2018  Decreased Interest 1  Down, Depressed, Hopeless 1  PHQ - 2 Score 2  Altered sleeping 2  Tired, decreased energy 3  Change in appetite 3  Feeling bad or failure about yourself  0  Trouble concentrating 1  Moving slowly or fidgety/restless 1  Suicidal thoughts 0  PHQ-9 Score 12  Difficult doing work/chores Somewhat difficult    No flowsheet data found.   Other providers/specialists: Patient Care Team: [name], [place] as PCP - General (Physician Assistant)   Past Medical History:  Diagnosis Date  . Depression   . Frequent headaches   . 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 Hyperlipidemia   . Stroke [facility])    09/2015     Social History   Socioeconomic History  . Marital status: Married    Spouse name: Not on file  . Number of children: Not on file  . Years of education: Not on file  . Highest education level: Not on file  Occupational History  . Occupation: Furniture conservator/restorer  Social Needs  . Financial resource strain: Not on file  . Food insecurity:    Worry: Not on file    Inability: Not on file  .  Transportation needs:    Medical: Not on file    Non-medical: Not on file  Tobacco Use  . Smoking status: Never Smoker  . Smokeless tobacco: Never Used  Substance and Sexual Activity  . Alcohol use: No  . Drug use: No  . Sexual activity: Not Currently    Birth control/protection: None  Lifestyle  . Physical activity:    Days per week: Not on file    Minutes per session: Not on file  . Stress: Not on file  Relationships  . Social connections:    Talks on phone: Not on file    Gets together: Not on file    Attends religious service: Not on file    Active member of club or organization: Not on file     Attends meetings of clubs or organizations: Not on file    Relationship status: Not on file  . Intimate partner violence:    Fear of current or ex partner: Not on file    Emotionally abused: Not on file    Physically abused: Not on file    Forced sexual activity: Not on file  Other Topics Concern  . Not on file  Social History Narrative   From [place]   Lived in [place] for 20 years, and been in [place] for 6 years   Husband and son, 33   Works: clean homes    Past Surgical History:  Procedure Laterality Date  . RIGHT ARM SURGERY    . TEE WITHOUT CARDIOVERSION N/A 10/13/2015   Procedure: TRANSESOPHAGEAL ECHOCARDIOGRAM (TEE);  Surgeon: Lelon Perla, MD;  Location: [facility] ENDOSCOPY;  Service: Cardiovascular;  Laterality: N/A;    Family History  Problem Relation Age of Onset  . Leukemia Mother   . Colon cancer Brother   . Asthma Neg Hx   . Cancer Neg Hx   . Diabetes Neg Hx   . Hyperlipidemia Neg Hx   . Heart failure Neg Hx   . Hypertension Neg Hx   . Migraines Neg Hx   . Rashes / Skin problems Neg Hx   . Seizures Neg Hx   . Stroke Neg Hx   . Thyroid disease Neg Hx     Allergies  Allergen Reactions  . Atorvastatin Other (See Comments)    Muscle aches  . Tramadol Nausea Only     Current Medications:   Current Outpatient Medications:  .  naproxen sodium (ALEVE) 220 MG tablet, Take 220 mg by mouth 2 (two) times daily as needed., Disp: , Rfl:  .  atorvastatin (LIPITOR) 40 MG tablet, Take 1 tablet (40 mg total) by mouth daily., Disp: 30 tablet, Rfl: 1 .  nebivolol (BYSTOLIC) 5 MG tablet, Take 1 tablet (5 mg total) by mouth daily., Disp: 30 tablet, Rfl: 1   Review of Systems:   ROS  Negative unless otherwise specified per HPI.  Vitals:   Vitals:   07/21/18 1055  BP: (!) 142/88  Pulse: 90  Temp: 98.5 F (36.9 C)  TempSrc: Oral  SpO2: 99%  Weight: 133 lb 6.4 oz (60.5 kg)  Height: 5\' 5"  (1.651 m)     Body mass index is 22.2 kg/m.  Physical Exam:   Physical  Exam  Constitutional: She appears well-developed. She is cooperative.  Non-toxic appearance. She does not have a sickly appearance. She does not appear ill. No distress.  Cardiovascular: Normal rate, regular rhythm, S1 normal, S2 normal, normal heart sounds and normal pulses.  No LE edema  Pulmonary/Chest:  Effort normal and breath sounds normal.  Neurological: She is alert. She has normal strength. No cranial nerve deficit or sensory deficit. Coordination and gait normal. GCS eye subscore is 4. GCS verbal subscore is 5. GCS motor subscore is 6.  Skin: Skin is warm, dry and intact.  Psychiatric: She has a normal mood and affect. Her speech is normal and behavior is normal.  Nursing note and vitals reviewed.  EKG tracing is personally reviewed.  EKG notes NSR.  No acute changes.   Assessment and Plan:    Gabrille was seen today for new patient (initial visit) and headache.  Diagnoses and all orders for this visit:  Cryptogenic stroke [facility]) Resume daily ASA. Restart statin -- will start Lipitor 40 mg. Recheck lipid panel today. Restart Bystolic 5 mg daily.  -     CBC with Differential/Platelet -     Comprehensive metabolic panel  Hypertension; Premature ventricular contractions Restart Bystolic. Refer back to cardiology. May need EP based on most recent notes -- will defer to cards.  New daily persistent headache STAT MRI scheduled for 07/23/18 at [facility]. If symptoms change in any way, patient was told to go to the ER and she verbalized understanding. -     CBC with Differential/Platelet -     Comprehensive metabolic panel -     EKG [id number] -     MR Brain Wo Contrast; Future  Hyperlipidemia, unspecified hyperlipidemia type -     Lipid panel  Other orders -     nebivolol (BYSTOLIC) 5 MG tablet; Take 1 tablet (5 mg total) by mouth daily. -     atorvastatin (LIPITOR) 40 MG tablet; Take 1 tablet (40 mg total) by mouth daily.  . Reviewed expectations re: course of current  medical issues. . Discussed self-management of symptoms. . Outlined signs and symptoms indicating need for more acute intervention. . Patient verbalized understanding and all questions were answered. . See orders for this visit as documented in the electronic medical record. . Patient received an After-Visit Summary.  I spent 75 minutes with this patient, greater than 50% was face-to-face time counseling regarding the above diagnoses.  [name], PA-C

## 2018-07-21 NOTE — Patient Instructions (Signed)
It was great to see you!  1. Start 81 mg aspirin daily (available over the counter) 2. Start 5 mg bystolic daily 3. Start 40 mg lipitor daily 4. Please call [facility] at [phone] and tell them that you are self pay and have a referral in for STAT MRI. If they are unable to accommodate you, we will have to send you to the ER. 5. It is important that you return to your cardiologist to re-establish with them. I am going to put in a referral.   Let's follow-up in 1 month, sooner if you have concerns.  Take care,  [name] PA-C

## 2018-07-22 ENCOUNTER — Other Ambulatory Visit (HOSPITAL_COMMUNITY): Payer: Self-pay | Admitting: *Deleted

## 2018-07-22 DIAGNOSIS — N644 Mastodynia: Secondary | ICD-10-CM

## 2018-07-23 ENCOUNTER — Ambulatory Visit
Admission: RE | Admit: 2018-07-23 | Discharge: 2018-07-23 | Disposition: A | Discharge status: Home / Self Care | Payer: Self-pay | Source: Ambulatory Visit | Attending: Physician Assistant | Admitting: Physician Assistant

## 2018-07-23 DIAGNOSIS — G4452 New daily persistent headache (NDPH): Secondary | ICD-10-CM

## 2018-07-24 ENCOUNTER — Telehealth: Payer: Self-pay | Admitting: Physician Assistant

## 2018-07-24 NOTE — Telephone Encounter (Signed)
Noted  

## 2018-07-24 NOTE — Telephone Encounter (Signed)
Copied from [place] [phone]. Topic: General - Other >> Jul 23, 2018  6:44 PM Marin Olp L wrote: Reason for CRM: Patient did not have child care so she missed her MRI today. She will call [facility] in the morning to r/s.

## 2018-07-28 ENCOUNTER — Other Ambulatory Visit: Payer: Self-pay | Admitting: Physician Assistant

## 2018-07-28 DIAGNOSIS — G4452 New daily persistent headache (NDPH): Secondary | ICD-10-CM

## 2018-08-04 ENCOUNTER — Ambulatory Visit
Admission: RE | Admit: 2018-08-04 | Discharge: 2018-08-04 | Disposition: A | Discharge status: Home / Self Care | Payer: Self-pay | Source: Ambulatory Visit | Attending: Physician Assistant | Admitting: Physician Assistant

## 2018-08-04 DIAGNOSIS — G4452 New daily persistent headache (NDPH): Secondary | ICD-10-CM

## 2018-08-07 ENCOUNTER — Other Ambulatory Visit: Payer: Self-pay | Admitting: Physician Assistant

## 2018-08-07 DIAGNOSIS — R519 Headache, unspecified: Secondary | ICD-10-CM

## 2018-08-07 DIAGNOSIS — I639 Cerebral infarction, unspecified: Secondary | ICD-10-CM

## 2018-08-07 DIAGNOSIS — R51 Headache: Secondary | ICD-10-CM

## 2018-08-10 ENCOUNTER — Telehealth: Payer: Self-pay | Admitting: *Deleted

## 2018-08-10 NOTE — Telephone Encounter (Signed)
Spoke to pt, told her I was calling about referral to Neurology and I did not realize you had already seen a Neurologist in the past. Told her I received a message from where we sent the referral and they said you will need to see the Neurologist you saw before Dr. Leonie Man. Pt verbalized understanding. Asked her if she had Dr. Clydene Fake phone number? Pt said no. Gave pt Dr. Clydene Fake phone # and told her to call and schedule appt as soon as she can for her headaches. Pt verbalized understanding.

## 2018-08-21 ENCOUNTER — Ambulatory Visit: Payer: Self-pay | Admitting: Physician Assistant

## 2018-08-25 ENCOUNTER — Ambulatory Visit: Payer: Self-pay | Admitting: Physician Assistant

## 2018-08-25 NOTE — Progress Notes (Deleted)
Carla [name] is a 47 y.o. female is here to discuss:   I acted as a Education administrator for [employer], PA-C Anselmo Pickler, LPN  History of Present Illness:   No chief complaint on file.   HPI  Health Maintenance Due  Topic Date Due  . TETANUS/TDAP  10/28/1989    Past Medical History:  Diagnosis Date  . Depression   . Frequent headaches   . 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 Hyperlipidemia   . Stroke [facility])    09/2015     Social History   Socioeconomic History  . Marital status: Married    Spouse name: Not on file  . Number of children: Not on file  . Years of education: Not on file  . Highest education level: Not on file  Occupational History  . Occupation: Furniture conservator/restorer  Social Needs  . Financial resource strain: Not on file  . Food insecurity:    Worry: Not on file    Inability: Not on file  . Transportation needs:    Medical: Not on file    Non-medical: Not on file  Tobacco Use  . Smoking status: Never Smoker  . Smokeless tobacco: Never Used  Substance and Sexual Activity  . Alcohol use: No  . Drug use: No  . Sexual activity: Not Currently    Birth control/protection: None  Lifestyle  . Physical activity:    Days per week: Not on file    Minutes per session: Not on file  . Stress: Not on file  Relationships  . Social connections:    Talks on phone: Not on file    Gets together: Not on file    Attends religious service: Not on file    Active member of club or organization: Not on file    Attends meetings of clubs or organizations: Not on file    Relationship status: Not on file  . Intimate partner violence:    Fear of current or ex partner: Not on file    Emotionally abused: Not on file    Physically abused: Not on file    Forced sexual activity: Not on file  Other Topics Concern  . Not on file  Social History Narrative   From [place]   Lived in [place] for 20 years, and been in [place] for 6 years   Husband and son, 47   Works: clean homes    Past  Surgical History:  Procedure Laterality Date  . RIGHT ARM SURGERY    . TEE WITHOUT CARDIOVERSION N/A 10/13/2015   Procedure: TRANSESOPHAGEAL ECHOCARDIOGRAM (TEE);  Surgeon: Lelon Perla, MD;  Location: [facility] ENDOSCOPY;  Service: Cardiovascular;  Laterality: N/A;    Family History  Problem Relation Age of Onset  . Leukemia Mother   . Colon cancer Brother   . Asthma Neg Hx   . Cancer Neg Hx   . Diabetes Neg Hx   . Hyperlipidemia Neg Hx   . Heart failure Neg Hx   . Hypertension Neg Hx   . Migraines Neg Hx   . Rashes / Skin problems Neg Hx   . Seizures Neg Hx   . Stroke Neg Hx   . Thyroid disease Neg Hx     PMHx, SurgHx, SocialHx, FamHx, Medications, and Allergies were reviewed in the Visit Navigator and updated as appropriate.   Patient Active Problem List   Diagnosis Date Noted  . Left knee pain 01/08/2018  . 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 Premature ventricular contractions  03/25/2016  . Hyperlipidemia 03/25/2016  . Benign paroxysmal positional vertigo 03/11/2016  . Neck pain on right side 02/19/2016  . Cryptogenic stroke ([place]) 12/28/2015  . Multiple hemangiomas   . Cerebral infarction due to unspecified mechanism   . 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 Migraines 10/11/2015    Social History   Tobacco Use  . Smoking status: Never Smoker  . Smokeless tobacco: Never Used  Substance Use Topics  . Alcohol use: No  . Drug use: No    Current Medications and Allergies:    Current Outpatient Medications:  .  atorvastatin (LIPITOR) 40 MG tablet, Take 1 tablet (40 mg total) by mouth daily., Disp: 30 tablet, Rfl: 1 .  naproxen sodium (ALEVE) 220 MG tablet, Take 220 mg by mouth 2 (two) times daily as needed., Disp: , Rfl:  .  nebivolol (BYSTOLIC) 5 MG tablet, Take 1 tablet (5 mg total) by mouth daily., Disp: 30 tablet, Rfl: 1  Allergies  Allergen Reactions  . Atorvastatin Other (See Comments)    Muscle aches  . Tramadol Nausea Only    Review of Systems    ROS  Vitals:  There were no vitals filed for this visit.   There is no height or weight on file to calculate BMI.   Physical Exam:    Physical Exam   Assessment and Plan:    There are no diagnoses linked to this encounter.  . Reviewed expectations re: course of current medical issues. . Discussed self-management of symptoms. . Outlined signs and symptoms indicating need for more acute intervention. . Patient verbalized understanding and all questions were answered. . See orders for this visit as documented in the electronic medical record. . Patient received an After Visit Summary.  ***  [name], PA-C [place] 08/25/2018  Follow-up: No follow-ups on file.

## 2018-08-28 ENCOUNTER — Telehealth: Payer: Self-pay | Admitting: *Deleted

## 2018-08-28 NOTE — Telephone Encounter (Signed)
Referral Request Placed in Scheduling Box. There were no notes attached.  [facility] [place]  P# [phone] F# [phone]

## 2018-09-02 ENCOUNTER — Encounter: Payer: Self-pay | Admitting: Physician Assistant

## 2018-09-02 ENCOUNTER — Ambulatory Visit (INDEPENDENT_AMBULATORY_CARE_PROVIDER_SITE_OTHER): Payer: Self-pay | Admitting: Physician Assistant

## 2018-09-02 VITALS — BP 120/80 | HR 88 | Temp 98.5°F | Ht 65.0 in | Wt 132.4 lb

## 2018-09-02 DIAGNOSIS — R030 Elevated blood-pressure reading, without diagnosis of hypertension: Secondary | ICD-10-CM

## 2018-09-02 DIAGNOSIS — G43909 Migraine, unspecified, not intractable, without status migrainosus: Secondary | ICD-10-CM

## 2018-09-02 DIAGNOSIS — I639 Cerebral infarction, unspecified: Secondary | ICD-10-CM

## 2018-09-02 MED ORDER — ATORVASTATIN CALCIUM 40 MG PO TABS: 40.0000 mg | ORAL_TABLET | Freq: Every day | ORAL | 0 refills | Status: DC

## 2018-09-02 MED ORDER — NEBIVOLOL HCL 5 MG PO TABS: 5.0000 mg | ORAL_TABLET | Freq: Every day | ORAL | 10 refills | Status: DC

## 2018-09-02 NOTE — Patient Instructions (Signed)
Let's follow-up sometime after 10/23/18 to repeat your cholesterol labs and re-check your blood pressure.  If you do not hear anything from the neurologist within 1 week, please call us and let us know.

## 2018-09-02 NOTE — Progress Notes (Signed)
Carla [name] is a 47 y.o. female is here to discuss: Blood pressure and headaches.  I acted as a Education administrator for [employer], PA-C Anselmo Pickler, LPN  History of Present Illness:   Chief Complaint  Patient presents with  . Headache  . Hypertension    HPI   Headaches Her headaches have improved with the bystolic 5 mg daily. Taking medication as prescribed. Denies changes to HA. Unfortunately she has not been to see neurology. I am resubmitting referral today.  Elevated blood pressure reading Currently taking bystolic 5 mg. At home blood pressure readings are: not being checked. Patient denies chest pain, SOB, blurred vision, dizziness, unusual headaches, lower leg swelling. Patient is compliant with medication. Denies excessive caffeine intake, stimulant usage, excessive alcohol intake, or increase in salt consumption.  BP Readings from Last 3 Encounters:  09/02/18 120/80  07/21/18 (!) 142/88  06/12/18 (!) 167/96     There are no preventive care reminders to display for this patient.  Past Medical History:  Diagnosis Date  . Depression   . Frequent headaches   . 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 Hyperlipidemia   . Stroke [facility])    09/2015     Social History   Socioeconomic History  . Marital status: Married    Spouse name: Not on file  . Number of children: Not on file  . Years of education: Not on file  . Highest education level: Not on file  Occupational History  . Occupation: Furniture conservator/restorer  Social Needs  . Financial resource strain: Not on file  . Food insecurity:    Worry: Not on file    Inability: Not on file  . Transportation needs:    Medical: Not on file    Non-medical: Not on file  Tobacco Use  . Smoking status: Never Smoker  . Smokeless tobacco: Never Used  Substance and Sexual Activity  . Alcohol use: No  . Drug use: No  . Sexual activity: Not Currently    Birth control/protection: None  Lifestyle  . Physical activity:    Days per week: Not  on file    Minutes per session: Not on file  . Stress: Not on file  Relationships  . Social connections:    Talks on phone: Not on file    Gets together: Not on file    Attends religious service: Not on file    Active member of club or organization: Not on file    Attends meetings of clubs or organizations: Not on file    Relationship status: Not on file  . Intimate partner violence:    Fear of current or ex partner: Not on file    Emotionally abused: Not on file    Physically abused: Not on file    Forced sexual activity: Not on file  Other Topics Concern  . Not on file  Social History Narrative   From [place]   Lived in [place] for 20 years, and been in [place] for 6 years   Husband and son, 20   Works: clean homes    Past Surgical History:  Procedure Laterality Date  . RIGHT ARM SURGERY    . TEE WITHOUT CARDIOVERSION N/A 10/13/2015   Procedure: TRANSESOPHAGEAL ECHOCARDIOGRAM (TEE);  Surgeon: Lelon Perla, MD;  Location: [facility] ENDOSCOPY;  Service: Cardiovascular;  Laterality: N/A;    Family History  Problem Relation Age of Onset  . Leukemia Mother   . Colon cancer Brother   . Asthma Neg  Hx   . Cancer Neg Hx   . Diabetes Neg Hx   . Hyperlipidemia Neg Hx   . Heart failure Neg Hx   . Hypertension Neg Hx   . Migraines Neg Hx   . Rashes / Skin problems Neg Hx   . Seizures Neg Hx   . Stroke Neg Hx   . Thyroid disease Neg Hx     PMHx, SurgHx, SocialHx, FamHx, Medications, and Allergies were reviewed in the Visit Navigator and updated as appropriate.   Patient Active Problem List   Diagnosis Date Noted  . Left knee pain 01/08/2018  . 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 Premature ventricular contractions 03/25/2016  . Hyperlipidemia 03/25/2016  . Benign paroxysmal positional vertigo 03/11/2016  . Neck pain on right side 02/19/2016  . Cryptogenic stroke ([place]) 12/28/2015  . Multiple hemangiomas   . Cerebral infarction due to unspecified mechanism   . Cavernous hemangioma of  liver 10/11/2015  . Migraines 10/11/2015    Social History   Tobacco Use  . Smoking status: Never Smoker  . Smokeless tobacco: Never Used  Substance Use Topics  . Alcohol use: No  . Drug use: No    Current Medications and Allergies:    Current Outpatient Medications:  .  atorvastatin (LIPITOR) 40 MG tablet, Take 1 tablet (40 mg total) by mouth daily., Disp: 90 tablet, Rfl: 0 .  naproxen sodium (ALEVE) 220 MG tablet, Take 220 mg by mouth 2 (two) times daily as needed., Disp: , Rfl:  .  nebivolol (BYSTOLIC) 5 MG tablet, Take 1 tablet (5 mg total) by mouth daily., Disp: 90 tablet, Rfl: 10   Allergies  Allergen Reactions  . Tramadol Nausea Only    Review of Systems   ROS  Negative unless otherwise specified per HPI.  Vitals:   Vitals:   09/02/18 1441  BP: 120/80  Pulse: 88  Temp: 98.5 F (36.9 C)  TempSrc: Oral  SpO2: 98%  Weight: 132 lb 6.1 oz (60 kg)  Height: 5\' 5"  (1.651 m)     Body mass index is 22.03 kg/m.   Physical Exam:    Physical Exam  Constitutional: She appears well-developed. She is cooperative.  Non-toxic appearance. She does not have a sickly appearance. She does not appear ill. No distress.  Cardiovascular: Normal rate, regular rhythm, S1 normal, S2 normal, normal heart sounds and normal pulses.  No LE edema  Pulmonary/Chest: Effort normal and breath sounds normal.  Neurological: She is alert. She has normal strength. No cranial nerve deficit or sensory deficit. Coordination and gait normal. GCS eye subscore is 4. GCS verbal subscore is 5. GCS motor subscore is 6.  Skin: Skin is warm, dry and intact.  Psychiatric: She has a normal mood and affect. Her speech is normal and behavior is normal.  Nursing note and vitals reviewed.    Assessment and Plan:    Carla [name] was seen today for headache and hypertension.  Diagnoses and all orders for this visit:  Migraine without status migrainosus, not intractable, unspecified migraine type;    Cryptogenic stroke ([facility]) HA has improved with Bystolic 5 mg. Follow-up with neurology, I am replacing the order.  Elevated blood pressure reading Resolved.  Other orders -     nebivolol (BYSTOLIC) 5 MG tablet; Take 1 tablet (5 mg total) by mouth daily. -     atorvastatin (LIPITOR) 40 MG tablet; Take 1 tablet (40 mg total) by mouth daily.  . Reviewed expectations re: course of current medical issues. Marland Kitchen  Discussed self-management of symptoms. . Outlined signs and symptoms indicating need for more acute intervention. . Patient verbalized understanding and all questions were answered. . See orders for this visit as documented in the electronic medical record. . Patient received an After Visit Summary.  CMA or LPN served as scribe during this visit. History, Physical, and Plan performed by medical provider. The above documentation has been reviewed and is accurate and complete.  [name], PA-C [place] 09/02/2018  Follow-up: Return in about 7 weeks (around 10/23/2018) for cholesterol and HTN f/u.

## 2018-09-10 ENCOUNTER — Ambulatory Visit: Payer: Self-pay

## 2018-09-10 ENCOUNTER — Ambulatory Visit (HOSPITAL_COMMUNITY): Payer: Self-pay | Attending: Obstetrics and Gynecology

## 2018-09-29 ENCOUNTER — Encounter: Payer: Self-pay | Admitting: Neurology

## 2018-09-29 ENCOUNTER — Ambulatory Visit: Payer: Self-pay | Admitting: Neurology

## 2018-09-29 VITALS — BP 139/83 | HR 80 | Wt 133.0 lb

## 2018-09-29 DIAGNOSIS — G459 Transient cerebral ischemic attack, unspecified: Secondary | ICD-10-CM

## 2018-09-29 DIAGNOSIS — G43009 Migraine without aura, not intractable, without status migrainosus: Secondary | ICD-10-CM

## 2018-09-29 MED ORDER — ASPIRIN 81 MG PO TABS: 81.0000 mg | ORAL_TABLET | Freq: Every day | ORAL | 1 refills | Status: DC

## 2018-09-29 MED ORDER — PRAVASTATIN SODIUM 40 MG PO TABS: 40.0000 mg | ORAL_TABLET | Freq: Every day | ORAL | 3 refills | Status: DC

## 2018-09-29 NOTE — Progress Notes (Signed)
[name] [address] [place]. [place] [address] [phone]       OFFICE CONSULT NOTE  [name] Date of Birth:  1971/03/25 Medical Record Number:  [id number]   Referring MD:  Jarold Motto, PA-c Reason for Referral:  headache  HPI: [name] is a pleasant 42 year Hispanic lady originally from [place] who was seen today for evaluation for headaches.  She is accompanied by her friend as well as Spanish language interpreter who provides translation for this visit.  History is obtained from them, review of electronic medical records and have personally reviewed imaging films.  Patient has been complaining of increasing headaches for the last 2 months.  She describes this headache as being right temporal and frontal mostly throbbing in nature though fluctuating in severity from 6/10 to 10/10.  Headache is accompanied by nausea vomiting light and sound sensitivity.  She has to stop her activities and lie down and sleeping as well as taking Tylenol seems to help.  Headaches are accompanied by a single vision spots and scintillations and blurred vision.  She denies any speech difficulties, vertigo, diplopia, gait or balance difficulties with headaches.  Headaches usually last about 30 to 40 minutes.  Headaches of late have been occurring about 2-3 times per week.  She does give a remote history of infrequent headaches which sound like migraine headaches and was similar but were mostly perimenstrual and not bad.  The patient also states in September 2019 she had a episode of speech difficulties as well as dizziness.  She was seen by primary care physician who states that the patient had a headache as well as feeling of dizziness and some speech difficulties.  This happened following a stressful event.  An outpatient MRI was ordered which was done on 08/05/2018 which I personally reviewed shows a few nonspecific white matter hyperintensities but no acute abnormality or  definite stroke.  Patient states her symptoms improved within a few days but the headaches have persisted since then.  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 MRA of the brain showed slight diminished peripheral branches in the left MCA but no large vessel stenosis or occlusion. Carotid ultrasound showed no significant extracranial stenosis. Transthoracic echo showed normal ejection fraction. Transesophageal echocardiogram showed no cardiac source of embolism or PFO. Antiphospholipid antibodies and lupus anticoagulant were both negative. Lipid profile showed elevated total cholesterol 207 and LDL 139 mg percent. Hemoglobin A1c was 5.6. Patient was started on aspirin as well as protocol. She states her speech difficulty and right-sided weakness completely improved.  She was placed on aspirin and pravastatin which she actually did well on but for unclear reason she discontinued both medications.  She is currently on Lipitor 40 which was started a few months ago by primary physician but she is complaining of dizziness on this medication and wants to change it or stop it.  She states she had slight balance difficulties from her previous stroke but had recovered well from that.  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  She states her blood pressure is under good control.  Last lipid profile checked in August 2019 showed LDL of 120 following which Lipitor was added.  I had recommended doing a transcranial Doppler bubble study at last visit with  me in 2017 but for some unclear reason this was never done..  ROS:   14 system review of systems is positive for headache, speech difficulty, dizziness and all other systems negative  PMH:  Past Medical History:  Diagnosis Date  . Depression   .  Frequent headaches   . 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 Hyperlipidemia   . Stroke [facility])    09/2015    Social History:  Social History   Socioeconomic History  . Marital status: Married    Spouse name: Not on file  . Number of children: Not on file  . Years of education: Not on file  . Highest education level: Not on file  Occupational History  . Occupation: Financial trader  Social Needs  . Financial resource strain: Not on file  . Food insecurity:    Worry: Not on file    Inability: Not on file  . Transportation needs:    Medical: Not on file    Non-medical: Not on file  Tobacco Use  . Smoking status: Never Smoker  . Smokeless tobacco: Never Used  Substance and Sexual Activity  . Alcohol use: No  . Drug use: No  . Sexual activity: Not Currently    Birth control/protection: None  Lifestyle  . Physical activity:    Days per week: Not on file    Minutes per session: Not on file  . Stress: Not on file  Relationships  . Social connections:    Talks on phone: Not on file    Gets together: Not on file    Attends religious service: Not on file    Active member of club or organization: Not on file    Attends meetings of clubs or organizations: Not on file    Relationship status: Not on file  . Intimate partner violence:    Fear of current or ex partner: Not on file    Emotionally abused: Not on file    Physically abused: Not on file    Forced sexual activity: Not on file  Other Topics Concern  . Not on file  Social History Narrative   From [place]   Lived in [place] for 20 years, and been in [place] for 6 years   Husband and son, 20   Works: clean homes    Medications:   Current Outpatient Medications on File Prior to Visit  Medication Sig Dispense Refill  . acetaminophen (TYLENOL) 325 MG tablet Take 650 mg by mouth every 6 (six) hours as needed.    . naproxen sodium (ALEVE) 220 MG tablet Take 220 mg by mouth 2 (two) times daily as needed.    . nebivolol (BYSTOLIC) 5 MG  tablet Take 1 tablet (5 mg total) by mouth daily. 90 tablet 10   No current facility-administered medications on file prior to visit.     Allergies:   Allergies  Allergen Reactions  . Tramadol Nausea Only    Physical Exam General: well developed, well nourished middle-aged Hispanic lady, seated, in no evident distress Head: head normocephalic and atraumatic.   Neck: supple with no carotid or supraclavicular bruits Cardiovascular: regular rate and rhythm, no murmurs Musculoskeletal: no deformity Skin:  no rash/petichiae Vascular:  Normal pulses all extremities  Neurologic Exam Mental Status: Exam limited due to using interpreter .awake and fully alert. Oriented to place and time. Recent and remote memory intact. Attention span, concentration and fund of knowledge appropriate. Mood and affect appropriate.  Cranial Nerves: Fundoscopic exam reveals sharp disc  margins. Pupils equal, briskly reactive to light. Extraocular movements full without nystagmus. Visual fields full to confrontation. Hearing intact. Facial sensation intact. Face, tongue, palate moves normally and symmetrically.  Motor: Normal bulk and tone. Normal strength in all tested extremity muscles. Sensory.: intact to touch , pinprick , position and vibratory sensation.  Coordination: Rapid alternating movements normal in all extremities. Finger-to-nose and heel-to-shin performed accurately bilaterally. Gait and Station: Arises from chair without difficulty. Stance is normal. Gait demonstrates normal stride length and balance . Able to heel, toe and tandem walk without difficulty.  Reflexes: 1+ and symmetric. Toes downgoing.   NIHSS 0 Modified Rankin 0   ASSESSMENT: 47 year old Hispanic lady with 2 to 68-month history of increasing headaches likely migraine headaches which appear to be improving now.  Remote history of cryptogenic stroke in 2016 with recent episode of transient speech difficulties and dizziness possibly a TIA  in September 2019.  Vascular risk factors of hyperlipidemia only     PLAN: I had a long d/w patient , her friend via spanish language interptreter about her recent TIA, migraines, remote stroke, risk for recurrent stroke/TIAs, personally independently reviewed imaging studies and stroke evaluation results and answered questions.Resume  aspirin 81 mg daily  for secondary stroke prevention and maintain strict control of hypertension with blood pressure goal below 130/90, diabetes with hemoglobin A1c goal below 6.5% and lipids with LDL cholesterol goal below 70 mg/dL. I also advised the patient to eat a healthy diet with plenty of whole grains, cereals, fruits and vegetables, exercise regularly and maintain ideal body weight . Check TCD Bubble study for PFO and change lipitor 40 mg to pravachol 40 mg daily for tolerance issues. She was advised to take tylenol for symptomatic relief of her migraines and to avoid migraine triggers. Her headaches are not frequent enough at present to justify migraine prophylaxis... Greater than 50% time during this 45-minute consultation visit was spent on counseling and coordination of care about her migraine headaches, remote stroke, recent TIA and answering questions followup in the future with my nurse practitioner in 3 months or call earlier if needed. [name], MD  [facility] [address] [place], [place] [address]  Phone [phone] Fax [phone] Note: This document was prepared with digital dictation and possible smart phrase technology. Any transcriptional errors that result from this process are unintentional.

## 2018-09-29 NOTE — Patient Instructions (Signed)
I had a long d/w patient , her friend via [facility] language interptreter about her recent TIA, migraines, remote stroke, risk for recurrent stroke/TIAs, personally independently reviewed imaging studies and stroke evaluation results and answered questions.Resume  aspirin 81 mg daily  for secondary stroke prevention and maintain strict control of hypertension with blood pressure goal below 130/90, diabetes with hemoglobin A1c goal below 6.5% and lipids with LDL cholesterol goal below 70 mg/dL. I also advised the patient to eat a healthy diet with plenty of whole grains, cereals, fruits and vegetables, exercise regularly and maintain ideal body weight . Check TCD Bubble study for PFO and change lipitor 40 mg to pravachol 40 mg daily for tolerance issues. She was advised to take tylenol for symptomatic relief of her migraines and to avoid migraine triggers. Her headaches are not frequent enough at present to justify migraine prophylaxis..Followup in the future with my nurse practitioner in 3 months or call earlier if needed.  Cefalea migraosa (Migraine Headache) Una cefalea migraosa es un dolor intenso y punzante en uno o ambos lados de la cabeza. Las migraas tambin pueden causar otros sntomas, como nuseas, vmitos y sensibilidad a la luz y el ruido. CAUSAS Hay ciertos factores que pueden provocar migraas, como los siguientes:  Alcohol.  Fumar.  Medicamentos, por ejemplo: ? Medicamentos para Best boy torcico (nitroglicerina). ? Pldoras anticonceptivas. ? Comprimidos de estrgeno. ? Ciertos medicamentos para la presin arterial.  Quesos curados, chocolate o cafena.  Los alimentos o las bebidas que contienen nitratos, glutamato, aspartamo o tiramina.  Realizar actividad fsica. Otros factores que pueden provocar migraa incluyen los siguientes:  Menstruacin.  Embarazo.  Hambre.  Estrs, poco o demasiado sueo, o fatiga.  Cambios climticos. [place] factores pueden hacer que usted sea ms propenso a tener migraas:  La edad. Los riesgos aumentan con la edad.  Antecedentes familiares de migraa.  Ser de Science writer.  Depresin y ansiedad.  Obesidad.  Ser mujer.  Tener un agujero en el corazn (persistencia del agujero oval) u otros problemas cardacos. SNTOMAS El principal sntoma de esta afeccin es el dolor intenso y punzante. El dolor:  Puede aparecer en cualquier regin de la cabeza, tanto de un lado como de [place].  Puede interferir con las actividades de la vida cotidiana.  Puede empeorar con la actividad fsica.  Puede empeorar ante la exposicin a luces brillantes o a ruidos fuertes. Otros sntomas pueden incluir lo siguiente:  Nuseas.  Vmitos.  Mareos.  Sensibilidad general a las luces brillantes, a los ruidos fuertes o a los [employer] Antes de sufrir una migraa, puede percibir seales de advertencia (aura). Un aura puede incluir:  Ver luces intermitentes o tener puntos ciegos.  Ver puntos brillantes, halos o lneas en zigzag.  Tener una visin en tnel o visin borrosa.  Sentir entumecimiento u hormigueo.  Tener dificultad para hablar.  Debilidad muscular. DIAGNSTICO La cefalea migraosa se diagnostica en funcin de lo siguiente:  Sus sntomas.  Un examen fsico.  Estudios, como una tomografa computarizada o una resonancia magntica de la cabeza. Estos estudios de diagnstico por imgenes pueden ayudar a Freight forwarder causas de cefalea.  Truddie Coco de lquido cefalorraqudeo (puncin lumbar) para Physiological scientist (anlisis de lquido cefalorraqudeo o anlisis de LCR). TRATAMIENTO Las cefaleas migraosas suelen tratarse con medicamentos que:  Financial trader.  [place] nuseas.  Evitan la recurrencia de las migraas. El tratamiento tambin puede incluir lo siguiente:  Acupuntura.  Cambios en el estilo de vida, [facility]  evitar alimentos que provoquen migraa. [place] los medicamentos de venta libre y los recetados solamente como se lo haya indicado el mdico.  No conduzca ni use maquinaria pesada mientras toma analgsicos recetados.  A fin de prevenir o tratar el estreimiento mientras toma analgsicos recetados, el mdico puede recomendarle lo siguiente: ? Beba suficiente lquido para mantener la orina clara o de color amarillo plido. ? Tomar medicamentos recetados o de [employer]. ? Consumir alimentos ricos en fibra, como frutas y verduras frescas, cereales integrales y frijoles. ? Limitar el consumo de alimentos con alto contenido de grasas y azcares procesados, como alimentos fritos o dulces. Estilo de vida  Evite el consumo de alcohol.  No consuma ningn producto que contenga nicotina o tabaco, como cigarrillos y Psychologist, sport and exercise. Si necesita ayuda para dejar de fumar, consulte al mdico.  Duerma como mnimo 8horas todas las noches.  Evite las situaciones de estrs. Instrucciones generales  Lleve un registro diario para Neurosurgeon lo que Financial trader. Por ejemplo, escriba: ? Lo que usted come y bebe. ? Cunto tiempo duerme. ? Algn cambio en su dieta o en los medicamentos.  Si tiene una migraa: ? Evite los factores que [employer] sntomas, como las luces brillantes. ? Resulta til acostarse en una habitacin oscura y silenciosa. ? No conduzca vehculos ni opere maquinaria pesada. ? Pregntele al mdico qu actividades son seguras para usted cuando tiene sntomas.  Concurra a todas las visitas de control como se lo haya indicado el mdico. Esto es importante. SOLICITE ATENCIN MDICA SI:  Tiene sntomas de migraa distintos o ms intensos que los habituales.  SOLICITE ATENCIN MDICA DE INMEDIATO SI:  La migraa se hace cada vez ms intensa.  Tiene fiebre.  Presenta rigidez en el cuello.  Tiene prdida de visin.  Siente debilidad en los  msculos o que no puede controlarlos.  Comienza a perder el equilibrio con frecuencia.  Comienza a tener dificultades para caminar.  Se desmaya.  Esta informacin no tiene Marine scientist el consejo del mdico. Asegrese de hacerle al mdico cualquier pregunta que tenga. Document Released: 11/11/2005 Document Revised: 09/01/2013 Document Reviewed: 04/29/2016 Elsevier Interactive Patient Education  2017 [employer].

## 2018-10-14 ENCOUNTER — Ambulatory Visit (HOSPITAL_COMMUNITY)
Admission: RE | Admit: 2018-10-14 | Discharge: 2018-10-14 | Disposition: A | Discharge status: Home / Self Care | Payer: Self-pay | Source: Ambulatory Visit | Attending: Neurology | Admitting: Neurology

## 2018-10-14 DIAGNOSIS — G459 Transient cerebral ischemic attack, unspecified: Secondary | ICD-10-CM

## 2018-10-14 DIAGNOSIS — G43009 Migraine without aura, not intractable, without status migrainosus: Secondary | ICD-10-CM

## 2018-10-14 NOTE — Progress Notes (Signed)
*  PRELIMINARY RESULTS* Vascular Ultrasound Transcranial Doppler with Bubbles has been completed. There is no evidence of high intensity transient signals (HITS) at rest or with valsalva, therefore there is no evidence of patent foramen ovale (PFO).  10/14/2018 2:01 PM Maudry Mayhew, MHA, RVT, RDCS, RDMS

## 2018-10-16 ENCOUNTER — Telehealth: Payer: Self-pay

## 2018-10-16 NOTE — Telephone Encounter (Signed)
-----   Message from Garvin Fila, MD sent at 10/15/2018  8:08 AM EST ----- Kindly inform patient that TCD Bubble study was negative. No evidence of hole in the heart

## 2018-10-16 NOTE — Telephone Encounter (Signed)
Vm was left for patient via language line spanish interpreter to give TCD bubble study results. VM was left per language line that patient will be call on Monday. ------

## 2018-10-19 ENCOUNTER — Ambulatory Visit: Payer: Self-pay | Admitting: Physician Assistant

## 2018-10-19 DIAGNOSIS — Z0289 Encounter for other administrative examinations: Secondary | ICD-10-CM

## 2018-10-20 ENCOUNTER — Encounter: Payer: Self-pay | Admitting: Physician Assistant

## 2018-10-20 ENCOUNTER — Ambulatory Visit: Payer: Self-pay | Admitting: Physician Assistant

## 2018-10-20 NOTE — Telephone Encounter (Signed)
Notes recorded by Marval Regal, RN on 10/20/2018 at 9:43 AM EST Rn call patient via language line at [phone]. Rn spoke with spanish interpreter on three way call to give patients results. RN stated the TCD bubble study was negative no hole in the heart. The interpreter relayed the message of the results,and pt verbalized understanding. ------

## 2018-10-21 ENCOUNTER — Other Ambulatory Visit: Payer: Self-pay | Admitting: Neurology

## 2018-12-02 ENCOUNTER — Other Ambulatory Visit: Payer: Self-pay | Admitting: Neurology

## 2018-12-02 ENCOUNTER — Telehealth: Payer: Self-pay | Admitting: Neurology

## 2018-12-02 MED ORDER — TOPIRAMATE 25 MG PO TABS: 25.0000 mg | ORAL_TABLET | Freq: Two times a day (BID) | ORAL | 3 refills | Status: DC

## 2018-12-02 NOTE — Telephone Encounter (Signed)
topamax 25 mg twice daily

## 2018-12-02 NOTE — Telephone Encounter (Signed)
Pt is called stating she is getting headaches everyday and is requesting any medication that would help with Migraine headaches. Please advise.

## 2018-12-03 NOTE — Telephone Encounter (Signed)
Revised. 

## 2018-12-03 NOTE — Telephone Encounter (Addendum)
I  called the language line at [phone] to get assistance with spanish interpreter. I  had the language line call pt via three way for notification of headache medication sent to her pharmacy. PT was told via language line in spanish, Dr Leonie Man sent top amax to be taken one pill twice daily for headaches. One in the am and one in the pm. PT ask how much is the cost of the medication? I  stated she will have to ask the pharmacy about the cost. The pt verbalized understanding of the above call and directions.

## 2018-12-11 ENCOUNTER — Other Ambulatory Visit: Payer: Self-pay | Admitting: Neurology

## 2018-12-19 ENCOUNTER — Other Ambulatory Visit: Payer: Self-pay

## 2018-12-19 ENCOUNTER — Encounter (HOSPITAL_COMMUNITY): Payer: Self-pay

## 2018-12-19 ENCOUNTER — Emergency Department (HOSPITAL_COMMUNITY): Payer: Self-pay

## 2018-12-19 ENCOUNTER — Emergency Department (HOSPITAL_COMMUNITY)
Admission: EM | Admit: 2018-12-19 | Discharge: 2018-12-19 | Disposition: A | Discharge status: Home / Self Care | Payer: Self-pay | Attending: Emergency Medicine | Admitting: Emergency Medicine

## 2018-12-19 DIAGNOSIS — Z7982 Long term (current) use of aspirin: Secondary | ICD-10-CM | POA: Insufficient documentation

## 2018-12-19 DIAGNOSIS — N939 Abnormal uterine and vaginal bleeding, unspecified: Secondary | ICD-10-CM | POA: Insufficient documentation

## 2018-12-19 DIAGNOSIS — Z79899 Other long term (current) drug therapy: Secondary | ICD-10-CM | POA: Insufficient documentation

## 2018-12-19 LAB — CBC WITH DIFFERENTIAL/PLATELET
Abs Immature Granulocytes: 0.03 10*3/uL (ref 0.00–0.07)
BASOS ABS: 0.1 10*3/uL (ref 0.0–0.1)
Basophils Relative: 1 %
EOS ABS: 0.1 10*3/uL (ref 0.0–0.5)
EOS PCT: 1 %
HEMATOCRIT: 35.2 % — AB (ref 36.0–46.0)
Hemoglobin: 11.5 g/dL — ABNORMAL LOW (ref 12.0–15.0)
Immature Granulocytes: 0 %
LYMPHS ABS: 2.4 10*3/uL (ref 0.7–4.0)
Lymphocytes Relative: 23 %
MCH: 29.7 pg (ref 26.0–34.0)
MCHC: 32.7 g/dL (ref 30.0–36.0)
MCV: 91 fL (ref 80.0–100.0)
MONO ABS: 0.6 10*3/uL (ref 0.1–1.0)
Monocytes Relative: 6 %
NRBC: 0 % (ref 0.0–0.2)
Neutro Abs: 7.3 10*3/uL (ref 1.7–7.7)
Neutrophils Relative %: 69 %
Platelets: 315 10*3/uL (ref 150–400)
RBC: 3.87 MIL/uL (ref 3.87–5.11)
RDW: 12.3 % (ref 11.5–15.5)
WBC: 10.4 10*3/uL (ref 4.0–10.5)

## 2018-12-19 LAB — COMPREHENSIVE METABOLIC PANEL
ALBUMIN: 3.5 g/dL (ref 3.5–5.0)
ALT: 22 U/L (ref 0–44)
ANION GAP: 7 (ref 5–15)
AST: 20 U/L (ref 15–41)
Alkaline Phosphatase: 53 U/L (ref 38–126)
BILIRUBIN TOTAL: 0.5 mg/dL (ref 0.3–1.2)
BUN: 9 mg/dL (ref 6–20)
CALCIUM: 8.9 mg/dL (ref 8.9–10.3)
CO2: 24 mmol/L (ref 22–32)
Chloride: 106 mmol/L (ref 98–111)
Creatinine, Ser: 0.72 mg/dL (ref 0.44–1.00)
GLUCOSE: 108 mg/dL — AB (ref 70–99)
POTASSIUM: 3.9 mmol/L (ref 3.5–5.1)
Sodium: 137 mmol/L (ref 135–145)
TOTAL PROTEIN: 6.3 g/dL — AB (ref 6.5–8.1)

## 2018-12-19 LAB — URINALYSIS, ROUTINE W REFLEX MICROSCOPIC
BILIRUBIN URINE: NEGATIVE
Bacteria, UA: NONE SEEN
Glucose, UA: NEGATIVE mg/dL
KETONES UR: 5 mg/dL — AB
Leukocytes, UA: NEGATIVE
NITRITE: NEGATIVE
PH: 5 (ref 5.0–8.0)
Protein, ur: 30 mg/dL — AB
Specific Gravity, Urine: 1.033 — ABNORMAL HIGH (ref 1.005–1.030)

## 2018-12-19 LAB — WET PREP, GENITAL
CLUE CELLS WET PREP: NONE SEEN
Sperm: NONE SEEN
TRICH WET PREP: NONE SEEN
YEAST WET PREP: NONE SEEN

## 2018-12-19 LAB — I-STAT BETA HCG BLOOD, ED (MC, WL, AP ONLY)

## 2018-12-19 MED ORDER — SODIUM CHLORIDE 0.9 % IV BOLUS: 1000.0000 mL | Freq: Once | INTRAVENOUS | Status: DC

## 2018-12-19 NOTE — Discharge Instructions (Addendum)
You were evaluated today for abnormal vaginal bleeding.  Your ultrasound showed a cyst as well as a fibroid.  This may be the cause of your bleeding.  I referred you to gynecology.  They will call you to schedule an appointment.  Please follow-up with your PCP for reevaluation on Monday.  Return to the ED for any worsening symptoms.

## 2018-12-19 NOTE — ED Provider Notes (Signed)
[facility] [facility] EMERGENCY DEPARTMENT Provider Note   CSN: [id number] Arrival date & time: 12/19/18  1300  History   Chief Complaint Chief Complaint  Patient presents with  . Vaginal Bleeding    HPI Carla [name] is a 48 y.o. female with past medical history significant for CVA, hyperlipidemia, depression who presents for evaluation of vaginal bleeding. Symptom onset 12 days ago. Symptoms are intermittent. Admits to using mediums size pads every 4 hours. Denies previous history of similar symptoms. Denies fever, chills, nausea, vomiting, lightheadedness, dizziness, weakness, chest pain, shortness of breath, abdominal pain, dysuria, pelvic pain, diarrhea or constipation.  Denies additional aggravating or alleviating factors.  Has not anything for symptoms. Admits to previous history of menorrhagia however her cycles typically last 5 days. Denies syncope episodes.  History provided by patient and family member.  Medical spanish interpretor was used.  HPI  Past Medical History:  Diagnosis Date  . Depression   . Frequent headaches   . 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 Hyperlipidemia   . Stroke [facility])    09/2015    Patient Active Problem List   Diagnosis Date Noted  . Left knee pain 01/08/2018  . 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 Premature ventricular contractions 03/25/2016  . Hyperlipidemia 03/25/2016  . Benign paroxysmal positional vertigo 03/11/2016  . Neck pain on right side 02/19/2016  . Cryptogenic stroke ([place]) 12/28/2015  . Multiple hemangiomas   . Cerebral infarction due to unspecified mechanism   . 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 Migraines 10/11/2015    Past Surgical History:  Procedure Laterality Date  . RIGHT ARM SURGERY    . TEE WITHOUT CARDIOVERSION N/A 10/13/2015   Procedure: TRANSESOPHAGEAL ECHOCARDIOGRAM (TEE);  Surgeon: Lelon Perla, MD;  Location: [facility] ENDOSCOPY;  Service: Cardiovascular;  Laterality: N/A;     OB History   No obstetric  history on file.      Home Medications    Prior to Admission medications   Medication Sig Start Date End Date Taking? Authorizing Provider  acetaminophen (TYLENOL) 325 MG tablet Take 650 mg by mouth every 6 (six) hours as needed.    [provider]  aspirin 81 MG tablet Take 1 tablet (81 mg total) by mouth daily. 09/29/18   Garvin Fila, MD  naproxen sodium (ALEVE) 220 MG tablet Take 220 mg by mouth 2 (two) times daily as needed.    [provider]  nebivolol (BYSTOLIC) 5 MG tablet Take 1 tablet (5 mg total) by mouth daily. 09/02/18   [name], PA  pravastatin (PRAVACHOL) 40 MG tablet Take 1 tablet (40 mg total) by mouth daily. 09/29/18   Garvin Fila, MD  topiramate (TOPAMAX) 25 MG tablet Take 1 tablet (25 mg total) by mouth 2 (two) times daily. 12/02/18   Garvin Fila, MD    Family History Family History  Problem Relation Age of Onset  . Leukemia Mother   . Colon cancer Brother   . Asthma Neg Hx   . Cancer Neg Hx   . Diabetes Neg Hx   . Hyperlipidemia Neg Hx   . Heart failure Neg Hx   . Hypertension Neg Hx   . Migraines Neg Hx   . Rashes / Skin problems Neg Hx   . Seizures Neg Hx   . Stroke Neg Hx   . Thyroid disease Neg Hx     Social History Social History   Tobacco Use  . Smoking status: Never Smoker  . Smokeless tobacco:  Never Used  Substance Use Topics  . Alcohol use: No  . Drug use: No     Allergies   Tramadol   Review of Systems Review of Systems  Constitutional: Negative.   HENT: Negative.   Respiratory: Negative.   Cardiovascular: Negative.   Gastrointestinal: Negative.   Genitourinary: Positive for menstrual problem and vaginal bleeding. Negative for decreased urine volume, difficulty urinating, enuresis, flank pain, frequency, genital sores, hematuria, pelvic pain, urgency, vaginal discharge and vaginal pain.  Musculoskeletal: Negative.   Skin: Negative.   Neurological: Negative.   All other systems reviewed and  are negative.    Physical Exam Updated Vital Signs BP 134/74   Pulse 61   Temp 98.5 F (36.9 C) (Oral)   Resp 14   LMP 12/08/2018 (Approximate)   SpO2 99%   Physical Exam Vitals signs and nursing note reviewed. Exam conducted with a chaperone present.  Constitutional:      General: She is not in acute distress.    Appearance: She is well-developed. She is not ill-appearing, toxic-appearing or diaphoretic.     Comments: Patient sitting in bed talking with family members on evaluation.  Does not appear in any acute distress.  HENT:     Head: Normocephalic and atraumatic.     Right Ear: Tympanic membrane, ear canal and external ear normal. There is no impacted cerumen.     Left Ear: Tympanic membrane, ear canal and external ear normal. There is no impacted cerumen.     Nose: Nose normal.     Mouth/Throat:     Mouth: Mucous membranes are moist.     Dentition: No gingival swelling or gum lesions.     Tongue: No lesions.     Pharynx: Oropharynx is clear.     Comments: Posterior oropharynx clear.  Uvula midline without deviation.  Membranes moist.  No pallor to gingiva. Eyes:     General: Lids are normal.     Pupils: Pupils are equal, round, and reactive to light.     Comments: No pallor to conjunctiva or membranes  Neck:     Musculoskeletal: Full passive range of motion without pain and normal range of motion.  Cardiovascular:     Rate and Rhythm: Normal rate.     Pulses: Normal pulses.     Heart sounds: Normal heart sounds.     Comments: No tachycardia. Pulmonary:     Effort: Pulmonary effort is normal. No respiratory distress.     Breath sounds: Normal breath sounds and air entry.     Comments: Clear to auscultation bilaterally without wheeze, rhonchi or rales.  Speaks in full sentences without difficulty. Abdominal:     General: There is no distension.     Hernia: There is no hernia in the right inguinal area or left inguinal area.     Comments: Soft, nontender without  rebound or guarding.  No CVA tenderness.  Normoactive bowel sounds.  Genitourinary:    Vagina: Normal.     Cervix: Cervical bleeding present. No discharge, friability or lesion.     Uterus: Normal.      Adnexa: Right adnexa normal and left adnexa normal.     Comments: Normal appearing external female genitalia without rashes or lesions, normal vaginal epithelium. Normal appearing cervix without discharge or petechiae. Cervical os is closed. There is moderate bleeding noted at the os.No odor. Bimanual: No CMT, nontender.  No palpable adnexal masses or tenderness. Uterus midline and not fixed. Rectovaginal exam was deferred.  No  cystocele or rectocele noted. No pelvic lymphadenopathy noted. Wet prep was obtained. Exam performed with chaperone in room. Musculoskeletal: Normal range of motion.     Comments: Moves all extremities difficulty.  Ambulatory in department of difficulty.  Lymphadenopathy:     Lower Body: No right inguinal adenopathy. No left inguinal adenopathy.  Skin:    General: Skin is warm and dry.     Comments: No skin pallor, rashes or lesions.  Neurological:     Mental Status: She is alert.     Motor: Motor function is intact.     Gait: Gait is intact.      ED Treatments / Results  Labs (all labs ordered are listed, but only abnormal results are displayed) Labs Reviewed  WET PREP, GENITAL - Abnormal; Notable for the following components:      Result Value   WBC, Wet Prep HPF POC FEW (*)    All other components within normal limits  CBC WITH DIFFERENTIAL/PLATELET - Abnormal; Notable for the following components:   Hemoglobin 11.5 (*)    HCT 35.2 (*)    All other components within normal limits  COMPREHENSIVE METABOLIC PANEL - Abnormal; Notable for the following components:   Glucose, Bld 108 (*)    Total Protein 6.3 (*)    All other components within normal limits  URINALYSIS, ROUTINE W REFLEX MICROSCOPIC - Abnormal; Notable for the following components:   Color,  Urine ORANGE (*)    Specific Gravity, Urine 1.033 (*)    Hgb urine dipstick SMALL (*)    Ketones, ur 5 (*)    Protein, ur 30 (*)    All other components within normal limits  I-STAT BETA HCG BLOOD, ED ([facility], [facility], [facility] ONLY)  WET PREP  (BD AFFIRM) ([place])    EKG None  Radiology US Transvaginal Non-ob  Result Date: 12/19/2018 CLINICAL DATA:  Vaginal bleeding EXAM: TRANSABDOMINAL AND TRANSVAGINAL ULTRASOUND OF PELVIS DOPPLER ULTRASOUND OF OVARIES TECHNIQUE: Both transabdominal and transvaginal ultrasound examinations of the pelvis were performed. Transabdominal technique was performed for global imaging of the pelvis including uterus, ovaries, adnexal regions, and pelvic cul-de-sac. It was necessary to proceed with endovaginal exam following the transabdominal exam to visualize the endometrium. Color and duplex Doppler ultrasound was utilized to evaluate blood flow to the ovaries. COMPARISON:  None. FINDINGS: Uterus Measurements: 85 x 46 x 57 mm = volume: 115 mL. Hypoechoic intramural mass anteriorly measuring 1 cm. Endometrium Thickness: 12 mm.  No focal abnormality visualized. Right ovary Measurements: 40 x 25 x 31 mm. Simple 2.9 cm cyst, likely follicular. Left ovary Measurements: 48 x 29 x 38 mm. Avascular mass measuring 2.6 cm with internal lace-like appearance and mid level echoes. One of the septations may have color Doppler flow. There is a separate simple cysts measuring 3 cm Pulsed Doppler evaluation of both ovaries demonstrates normal low-resistance arterial and venous waveforms. Other findings No abnormal free fluid. IMPRESSION: 1. History of abnormal uterine bleeding with no focal endometrial finding. Endometrial thickness is 12 mm. Assuming premenopausal patient, if bleeding remains unresponsive to hormonal or medical therapy, sonohysterogram should be considered for focal lesion work-up. (Ref: Radiological Reasoning: Algorithmic Workup of Abnormal Vaginal Bleeding with Endovaginal  Sonography and Sonohysterography. AJR 2008; 937:T02-40) 2. Probable 2.7 cm hemorrhagic cyst in the left ovary but 1 of the septations may have blood flow. Recommend follow-up in 6-12 weeks to evaluate for persistence. 3. 1 cm intramural fibroid. Electronically Signed   By: Neva Seat.D.  On: 12/19/2018 15:52   US Pelvis Complete  Result Date: 12/19/2018 CLINICAL DATA:  Vaginal bleeding EXAM: TRANSABDOMINAL AND TRANSVAGINAL ULTRASOUND OF PELVIS DOPPLER ULTRASOUND OF OVARIES TECHNIQUE: Both transabdominal and transvaginal ultrasound examinations of the pelvis were performed. Transabdominal technique was performed for global imaging of the pelvis including uterus, ovaries, adnexal regions, and pelvic cul-de-sac. It was necessary to proceed with endovaginal exam following the transabdominal exam to visualize the endometrium. Color and duplex Doppler ultrasound was utilized to evaluate blood flow to the ovaries. COMPARISON:  None. FINDINGS: Uterus Measurements: 85 x 46 x 57 mm = volume: 115 mL. Hypoechoic intramural mass anteriorly measuring 1 cm. Endometrium Thickness: 12 mm.  No focal abnormality visualized. Right ovary Measurements: 40 x 25 x 31 mm. Simple 2.9 cm cyst, likely follicular. Left ovary Measurements: 48 x 29 x 38 mm. Avascular mass measuring 2.6 cm with internal lace-like appearance and mid level echoes. One of the septations may have color Doppler flow. There is a separate simple cysts measuring 3 cm Pulsed Doppler evaluation of both ovaries demonstrates normal low-resistance arterial and venous waveforms. Other findings No abnormal free fluid. IMPRESSION: 1. History of abnormal uterine bleeding with no focal endometrial finding. Endometrial thickness is 12 mm. Assuming premenopausal patient, if bleeding remains unresponsive to hormonal or medical therapy, sonohysterogram should be considered for focal lesion work-up. (Ref: Radiological Reasoning: Algorithmic Workup of Abnormal Vaginal  Bleeding with Endovaginal Sonography and Sonohysterography. AJR 2008; 315:Q00-86) 2. Probable 2.7 cm hemorrhagic cyst in the left ovary but 1 of the septations may have blood flow. Recommend follow-up in 6-12 weeks to evaluate for persistence. 3. 1 cm intramural fibroid. Electronically Signed   By: Monte Fantasia M.D.   On: 12/19/2018 15:52   [place] Art/ven Flow Abd Pelv Doppler  Result Date: 12/19/2018 CLINICAL DATA:  Vaginal bleeding EXAM: TRANSABDOMINAL AND TRANSVAGINAL ULTRASOUND OF PELVIS DOPPLER ULTRASOUND OF OVARIES TECHNIQUE: Both transabdominal and transvaginal ultrasound examinations of the pelvis were performed. Transabdominal technique was performed for global imaging of the pelvis including uterus, ovaries, adnexal regions, and pelvic cul-de-sac. It was necessary to proceed with endovaginal exam following the transabdominal exam to visualize the endometrium. Color and duplex Doppler ultrasound was utilized to evaluate blood flow to the ovaries. COMPARISON:  None. FINDINGS: Uterus Measurements: 85 x 46 x 57 mm = volume: 115 mL. Hypoechoic intramural mass anteriorly measuring 1 cm. Endometrium Thickness: 12 mm.  No focal abnormality visualized. Right ovary Measurements: 40 x 25 x 31 mm. Simple 2.9 cm cyst, likely follicular. Left ovary Measurements: 48 x 29 x 38 mm. Avascular mass measuring 2.6 cm with internal lace-like appearance and mid level echoes. One of the septations may have color Doppler flow. There is a separate simple cysts measuring 3 cm Pulsed Doppler evaluation of both ovaries demonstrates normal low-resistance arterial and venous waveforms. Other findings No abnormal free fluid. IMPRESSION: 1. History of abnormal uterine bleeding with no focal endometrial finding. Endometrial thickness is 12 mm. Assuming premenopausal patient, if bleeding remains unresponsive to hormonal or medical therapy, sonohysterogram should be considered for focal lesion work-up. (Ref: Radiological Reasoning:  Algorithmic Workup of Abnormal Vaginal Bleeding with Endovaginal Sonography and Sonohysterography. AJR 2008; 761:P50-93) 2. Probable 2.7 cm hemorrhagic cyst in the left ovary but 1 of the septations may have blood flow. Recommend follow-up in 6-12 weeks to evaluate for persistence. 3. 1 cm intramural fibroid. Electronically Signed   By: Monte Fantasia M.D.   On: 12/19/2018 15:52    Procedures Procedures (including critical care  time)  Medications Ordered in ED Medications - No data to display   Initial Impression / Assessment and Plan / ED Course  I have reviewed the triage vital signs and the nursing notes.  Pertinent labs & imaging results that were available during my care of the patient were reviewed by me and considered in my medical decision making (see chart for details).  48 year old female who appears otherwise well presents for evaluation of vaginal bleeding.  Afebrile, nonseptic, non-ill-appearing.  Symptom onset 12 days ago.  Patient going through medium sized pad every 3-4 hours.  Patient states symptoms have been improving over the last 2 days, however she still vaginal bleeding.  Symptoms have been intermittent over the last 12 days.  No similar symptoms previously.  Mucous membranes moist.  No pallor to mucous membranes or skin.  Denies lightheaded, dizziness, weakness. Abdomen soft, nontender without rebound or guarding.  No hypotension or tachycardia. Will obtain labs, urinalysis, ultrasound and reevaluate.   GU exam with blood visible at the cervix. No cervical motion tenderness. CBC without leukocytosis, Hgb 11.5, previous 14.1, 13.7, 12.4. Wet prep with WBC, urinalysis negative for infection, metabolic panel. [place] with hemorrhagic cyst, intramural fibroid and thickened endometrium. No torsion. HCG negative.   On repeat exam abdomen soft, nontender without rebound or guarding.  Orthostatic vital signs negative. No evidence of requiring emergent blood transfusion at this time.  No evidence of hypotension or tachycardia throughout patient's emergency department visit.  No evidence of dizziness, lightheadedness with ambulation.  No history of syncope.  Discussed starting on iron supplementation.  Discussed following up with OB/GYN for evaluation.  Referral placed for Gyn.  Patient to follow-up with PCP on Monday.  Low suspicion for emergent pathology causing patient's symptoms at this time.  Discussed strict return precautions.  Patient and family voiced understanding agreeable for follow-up. I have discussed return precautions, results and plan with patient using a medical interpretor.    Final Clinical Impressions(s) / ED Diagnoses   Final diagnoses:  Abnormal uterine bleeding (AUB)    ED Discharge Orders         Ordered    Ambulatory referral to Gynecology     12/19/18 [address], Kaveri Perras A, PA-C 12/19/18 1623    Pattricia Boss, MD 12/20/18 1228

## 2018-12-19 NOTE — ED Triage Notes (Signed)
Pt presents for evaluation of heavy vaginal bleeding x 12 days. Reports no hx of same. Denies abd pain.

## 2018-12-19 NOTE — ED Notes (Signed)
Patient verbalizes understanding of discharge instructions. Opportunity for questioning and answers were provided. Armband removed by staff, pt discharged from ED.  

## 2018-12-19 NOTE — ED Notes (Signed)
Pt transported to Ultrasound.  

## 2018-12-21 ENCOUNTER — Encounter: Payer: Self-pay | Admitting: Physician Assistant

## 2018-12-21 ENCOUNTER — Other Ambulatory Visit: Payer: Self-pay | Admitting: Physician Assistant

## 2018-12-21 ENCOUNTER — Telehealth: Payer: Self-pay | Admitting: Physician Assistant

## 2018-12-21 ENCOUNTER — Ambulatory Visit: Payer: Self-pay | Admitting: Physician Assistant

## 2018-12-21 VITALS — BP 134/84 | HR 71 | Temp 98.1°F | Ht 65.0 in | Wt 134.2 lb

## 2018-12-21 DIAGNOSIS — N939 Abnormal uterine and vaginal bleeding, unspecified: Secondary | ICD-10-CM

## 2018-12-21 LAB — POCT HEMOGLOBIN: HEMOGLOBIN: 11.8 g/dL (ref 11–14.6)

## 2018-12-21 NOTE — Telephone Encounter (Signed)
Pt will keep her appt this afternoon. No other appts available

## 2018-12-21 NOTE — Telephone Encounter (Signed)
See note  Copied from [place] [id number]. Topic: Appointment Scheduling - Scheduling Inquiry for Clinic >> Dec 21, 2018  7:48 AM Scherrie Gerlach wrote: Reason for CRM: pt went to the ED Sat night for vaginal bleeding and they advised her to see her dr asap. Scheduled pt at 1:20 this afternoon, but pt requesting to be seen this am. Asking if [facility] can work her in this morning. Pt states the bleeding is the same, but a lot. Spoke with nurse triage who advised to make her the appt. Pt does not want to wait until the afternoon time. Please advise if OK to see someone else.

## 2018-12-21 NOTE — Progress Notes (Signed)
Carla [name] is a 48 y.o. female here for follow-up of a pre-existing problem.  History of Present Illness:   Chief Complaint  Patient presents with  . Vaginal Bleeding   In-person translator utilized during our visit.  HPI  Patient went to the ER on 12/19/18 fo AUB x 12 days. Pelvic u/s revealed: thickened endometrium (12 mm), L ovarian cyst, and 1 cm intramural fibroid. Labs essentially normal, Hgb 11.5  She reports that for the past 6-8 months, she has had two periods a month. Periods are about 5 days, and heavy for the first few days and then light.   For the past two weeks, she has been passing very large clots. She is not having pain. She has to change a medium sized pad every time she goes to the bathroom. She is passing large clots. She denies: chest pain, lightheadedness, dizziness, rectal bleeding.  Taking baby ASA daily.  Does not have an ob-gyn.  Wt Readings from Last 5 Encounters:  12/21/18 134 lb 4 oz (60.9 kg)  09/29/18 133 lb (60.3 kg)  09/02/18 132 lb 6.1 oz (60 kg)  07/21/18 133 lb 6.4 oz (60.5 kg)  01/08/18 135 lb (61.2 kg)     Past Medical History:  Diagnosis Date  . Depression   . Frequent headaches   . 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 Hyperlipidemia   . Stroke [facility])    09/2015     Social History   Socioeconomic History  . Marital status: Married    Spouse name: Not on file  . Number of children: Not on file  . Years of education: Not on file  . Highest education level: Not on file  Occupational History  . Occupation: Furniture conservator/restorer  Social Needs  . Financial resource strain: Not on file  . Food insecurity:    Worry: Not on file    Inability: Not on file  . Transportation needs:    Medical: Not on file    Non-medical: Not on file  Tobacco Use  . Smoking status: Never Smoker  . Smokeless tobacco: Never Used  Substance and Sexual Activity  . Alcohol use: No  . Drug use: No  . Sexual activity: Not Currently    Birth control/protection: None   Lifestyle  . Physical activity:    Days per week: Not on file    Minutes per session: Not on file  . Stress: Not on file  Relationships  . Social connections:    Talks on phone: Not on file    Gets together: Not on file    Attends religious service: Not on file    Active member of club or organization: Not on file    Attends meetings of clubs or organizations: Not on file    Relationship status: Not on file  . Intimate partner violence:    Fear of current or ex partner: Not on file    Emotionally abused: Not on file    Physically abused: Not on file    Forced sexual activity: Not on file  Other Topics Concern  . Not on file  Social History Narrative   From [place]   Lived in [place] for 20 years, and been in [place] for 6 years   Husband and son, 29   Works: clean homes    Past Surgical History:  Procedure Laterality Date  . RIGHT ARM SURGERY    . TEE WITHOUT CARDIOVERSION N/A 10/13/2015   Procedure: TRANSESOPHAGEAL ECHOCARDIOGRAM (TEE);  Surgeon: Aaron Edelman  Jacalyn Lefevre, MD;  Location: [facility];  Service: Cardiovascular;  Laterality: N/A;    Family History  Problem Relation Age of Onset  . Leukemia Mother   . Colon cancer Brother   . Asthma Neg Hx   . Cancer Neg Hx   . Diabetes Neg Hx   . Hyperlipidemia Neg Hx   . Heart failure Neg Hx   . Hypertension Neg Hx   . Migraines Neg Hx   . Rashes / Skin problems Neg Hx   . Seizures Neg Hx   . Stroke Neg Hx   . Thyroid disease Neg Hx     Allergies  Allergen Reactions  . Tramadol Nausea Only    Current Medications:   Current Outpatient Medications:  .  acetaminophen (TYLENOL) 325 MG tablet, Take 650 mg by mouth every 6 (six) hours as needed., Disp: , Rfl:  .  aspirin 81 MG tablet, Take 1 tablet (81 mg total) by mouth daily., Disp: 30 tablet, Rfl: 1 .  naproxen sodium (ALEVE) 220 MG tablet, Take 220 mg by mouth 2 (two) times daily as needed., Disp: , Rfl:  .  nebivolol (BYSTOLIC) 5 MG tablet, Take 1 tablet (5 mg total) by  mouth daily., Disp: 90 tablet, Rfl: 10 .  pravastatin (PRAVACHOL) 40 MG tablet, Take 1 tablet (40 mg total) by mouth daily., Disp: 30 tablet, Rfl: 3 .  topiramate (TOPAMAX) 25 MG tablet, Take 1 tablet (25 mg total) by mouth 2 (two) times daily., Disp: 120 tablet, Rfl: 3   Review of Systems:   ROS  A complete ROS was performed.  Vitals:   Vitals:   12/21/18 1322  BP: 134/84  Pulse: 71  Temp: 98.1 F (36.7 C)  TempSrc: Oral  SpO2: 99%  Weight: 134 lb 4 oz (60.9 kg)  Height: 5\' 5"  (1.651 m)     Body mass index is 22.34 kg/m.  Physical Exam:   Physical Exam Vitals signs and nursing note reviewed.  Constitutional:      General: She is not in acute distress.    Appearance: She is well-developed. She is not ill-appearing or toxic-appearing.  Cardiovascular:     Rate and Rhythm: Normal rate and regular rhythm.     Pulses: Normal pulses.     Heart sounds: Normal heart sounds, S1 normal and S2 normal.     Comments: No LE edema Pulmonary:     Effort: Pulmonary effort is normal.     Breath sounds: Normal breath sounds.  Abdominal:     General: Abdomen is flat. Bowel sounds are normal.     Palpations: Abdomen is soft.     Tenderness: There is no abdominal tenderness.  Skin:    General: Skin is warm and dry.  Neurological:     Mental Status: She is alert.     GCS: GCS eye subscore is 4. GCS verbal subscore is 5. GCS motor subscore is 6.  Psychiatric:        Speech: Speech normal.        Behavior: Behavior normal. Behavior is cooperative.     Results for orders placed or performed in visit on 12/21/18  POCT hemoglobin  Result Value Ref Range   Hemoglobin 11.8 11 - 14.6 g/dL    Assessment and Plan:   [name] was seen today for vaginal bleeding.  Diagnoses and all orders for this visit:  Abnormal uterine bleeding (AUB) -     POCT hemoglobin   Referral to ob-gyn was made. She  is going to [facility] [employer] at the end of our appointment to go fill out her  financial application and to try to obtain an appointment. I recommended that if she has any worsening of symptoms prior to going to the ob-gyn, I recommended that she go to [facility]. Patient verbalized understanding.  . Reviewed expectations re: course of current medical issues. . Discussed self-management of symptoms. . Outlined signs and symptoms indicating need for more acute intervention. . Patient verbalized understanding and all questions were answered. . See orders for this visit as documented in the electronic medical record. . Patient received an After-Visit Summary.  CMA or LPN served as scribe during this visit. History, Physical, and Plan performed by medical provider. The above documentation has been reviewed and is accurate and complete.  [name], PA-C

## 2018-12-21 NOTE — Telephone Encounter (Signed)
Hailey, please contact patient to advise

## 2018-12-21 NOTE — Patient Instructions (Signed)
It was great to see you!   

## 2018-12-21 NOTE — Telephone Encounter (Signed)
Pt is keeping 120 pm appt

## 2018-12-21 NOTE — Telephone Encounter (Signed)
Carla [name], discussed with Aldona Bar, pt can see another provider if chooses. I have 2 openings left this AM you can schedule her in one of them of she can keep the 1:20 PM appt.

## 2018-12-23 ENCOUNTER — Emergency Department (HOSPITAL_COMMUNITY)
Admission: EM | Admit: 2018-12-23 | Discharge: 2018-12-23 | Disposition: A | Discharge status: Home / Self Care | Payer: Self-pay | Attending: Emergency Medicine | Admitting: Emergency Medicine

## 2018-12-23 ENCOUNTER — Other Ambulatory Visit: Payer: Self-pay

## 2018-12-23 DIAGNOSIS — Z7982 Long term (current) use of aspirin: Secondary | ICD-10-CM | POA: Insufficient documentation

## 2018-12-23 DIAGNOSIS — Z79899 Other long term (current) drug therapy: Secondary | ICD-10-CM | POA: Insufficient documentation

## 2018-12-23 DIAGNOSIS — Z8673 Personal history of transient ischemic attack (TIA), and cerebral infarction without residual deficits: Secondary | ICD-10-CM | POA: Insufficient documentation

## 2018-12-23 DIAGNOSIS — N939 Abnormal uterine and vaginal bleeding, unspecified: Secondary | ICD-10-CM | POA: Insufficient documentation

## 2018-12-23 LAB — CBC
HEMATOCRIT: 34.1 % — AB (ref 36.0–46.0)
HEMOGLOBIN: 11.2 g/dL — AB (ref 12.0–15.0)
MCH: 29.9 pg (ref 26.0–34.0)
MCHC: 32.8 g/dL (ref 30.0–36.0)
MCV: 91.2 fL (ref 80.0–100.0)
Platelets: 385 10*3/uL (ref 150–400)
RBC: 3.74 MIL/uL — ABNORMAL LOW (ref 3.87–5.11)
RDW: 12.4 % (ref 11.5–15.5)
WBC: 9.6 10*3/uL (ref 4.0–10.5)
nRBC: 0 % (ref 0.0–0.2)

## 2018-12-23 LAB — COMPREHENSIVE METABOLIC PANEL
ALBUMIN: 3.5 g/dL (ref 3.5–5.0)
ALT: 16 U/L (ref 0–44)
AST: 19 U/L (ref 15–41)
Alkaline Phosphatase: 56 U/L (ref 38–126)
Anion gap: 8 (ref 5–15)
BUN: 12 mg/dL (ref 6–20)
CHLORIDE: 107 mmol/L (ref 98–111)
CO2: 24 mmol/L (ref 22–32)
Calcium: 8.8 mg/dL — ABNORMAL LOW (ref 8.9–10.3)
Creatinine, Ser: 0.83 mg/dL (ref 0.44–1.00)
GFR calc Af Amer: >60 mL/min (ref >60)
Glucose, Bld: 101 mg/dL — ABNORMAL HIGH (ref 70–99)
POTASSIUM: 3.9 mmol/L (ref 3.5–5.1)
SODIUM: 139 mmol/L (ref 135–145)
Total Bilirubin: 0.6 mg/dL (ref 0.3–1.2)
Total Protein: 6.4 g/dL — ABNORMAL LOW (ref 6.5–8.1)

## 2018-12-23 MED ORDER — HYDROCODONE-ACETAMINOPHEN 5-325 MG PO TABS: 1.0000 | ORAL_TABLET | Freq: Four times a day (QID) | ORAL | 0 refills | Status: DC | PRN

## 2018-12-23 MED ORDER — ONDANSETRON 4 MG PO TBDP: 4.0000 mg | ORAL_TABLET | Freq: Three times a day (TID) | ORAL | 0 refills | Status: DC | PRN

## 2018-12-23 MED ORDER — MEDROXYPROGESTERONE ACETATE 10 MG PO TABS: 10.0000 mg | ORAL_TABLET | Freq: Every day | ORAL | 0 refills | Status: DC

## 2018-12-23 NOTE — ED Triage Notes (Signed)
Patient returned to be seen for heavy vaginal bleeding. States was seen at [facility] and dx with a RLQ cyst. Has an appt on 01/23/2019 for evaluation of problem.

## 2018-12-23 NOTE — ED Provider Notes (Signed)
[place] EMERGENCY DEPARTMENT Provider Note   CSN: [id number] Arrival date & time: 12/23/18  0204     History   Chief Complaint Chief Complaint  Patient presents with  . Vaginal Bleeding    HPI Carla [name] is a 48 y.o. female.  HPI   48 year old female here with ongoing vaginal bleeding.  The patient has been seen twice for similar symptoms over the last week.  She was diagnosed with ovarian cyst and thickened endometrium stripe on 1/25.  She is had persistent bleeding since then.  Her symptoms started several weeks ago, with a mild headache followed by vaginal bleeding.  Headache is resolved.  She also had some mild indigestion and sensation of burning in her epigastrium.  She is been taking over-the-counter medications for her pain.  No fevers or chills.  No dyspareunia.  No vaginal discharge.  No rash.  No overt abdominal pain, though she intermittently has right lower quadrant discomfort, which is where her cyst is.  Symptoms persisted throughout the night, so she presents again for evaluation.  She has not taken any birth control or other medications for her bleeding.  No history of blood clots, DVT, PE, or adverse reactions to estrogen.  Past Medical History:  Diagnosis Date  . Depression   . Frequent headaches   . 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 Hyperlipidemia   . Stroke [facility])    09/2015    Patient Active Problem List   Diagnosis Date Noted  . Left knee pain 01/08/2018  . 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 Premature ventricular contractions 03/25/2016  . Hyperlipidemia 03/25/2016  . Benign paroxysmal positional vertigo 03/11/2016  . Neck pain on right side 02/19/2016  . Cryptogenic stroke ([place]) 12/28/2015  . Multiple hemangiomas   . Cerebral infarction due to unspecified mechanism   . 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 Migraines 10/11/2015    Past Surgical History:  Procedure Laterality Date  . RIGHT ARM SURGERY    . TEE WITHOUT  CARDIOVERSION N/A 10/13/2015   Procedure: TRANSESOPHAGEAL ECHOCARDIOGRAM (TEE);  Surgeon: Lelon Perla, MD;  Location: [facility] END[facility];  Service: Cardiovascular;  Laterality: N/A;     OB History   No obstetric history on file.      Home Medications    Prior to Admission medications   Medication Sig Start Date End Date Taking? Authorizing Provider  acetaminophen (TYLENOL) 325 MG tablet Take 650 mg by mouth every 6 (six) hours as needed.    [provider]  aspirin 81 MG tablet Take 1 tablet (81 mg total) by mouth daily. 09/29/18   Garvin Fila, MD  HYDROcodone-acetaminophen (NORCO/VICODIN) 5-325 MG tablet Take 1-2 tablets by mouth every 6 (six) hours as needed for moderate pain or severe pain. 12/23/18   Duffy Bruce, MD  medroxyPROGESTERone (PROVERA) 10 MG tablet Take 1 tablet (10 mg total) by mouth daily for 10 days. 12/23/18 01/02/19  Duffy Bruce, MD  naproxen sodium (ALEVE) 220 MG tablet Take 220 mg by mouth 2 (two) times daily as needed.    [provider]  nebivolol (BYSTOLIC) 5 MG tablet Take 1 tablet (5 mg total) by mouth daily. 09/02/18   [name], PA  ondansetron (ZOFRAN ODT) 4 MG disintegrating tablet Take 1 tablet (4 mg total) by mouth every 8 (eight) hours as needed for nausea or vomiting. 12/23/18   Duffy Bruce, MD  pravastatin (PRAVACHOL) 40 MG tablet Take 1 tablet (40 mg total) by mouth daily. 09/29/18  Garvin Fila, MD  topiramate (TOPAMAX) 25 MG tablet Take 1 tablet (25 mg total) by mouth 2 (two) times daily. 12/02/18   Garvin Fila, MD    Family History Family History  Problem Relation Age of Onset  . Leukemia Mother   . Colon cancer Brother   . Asthma Neg Hx   . Cancer Neg Hx   . Diabetes Neg Hx   . Hyperlipidemia Neg Hx   . Heart failure Neg Hx   . Hypertension Neg Hx   . Migraines Neg Hx   . Rashes / Skin problems Neg Hx   . Seizures Neg Hx   . Stroke Neg Hx   . Thyroid disease Neg Hx     Social History Social  History   Tobacco Use  . Smoking status: Never Smoker  . Smokeless tobacco: Never Used  Substance Use Topics  . Alcohol use: No  . Drug use: No     Allergies   Tramadol   Review of Systems Review of Systems  Constitutional: Positive for fatigue. Negative for chills and fever.  HENT: Negative for congestion and rhinorrhea.   Eyes: Negative for visual disturbance.  Respiratory: Negative for cough, shortness of breath and wheezing.   Cardiovascular: Negative for chest pain and leg swelling.  Gastrointestinal: Negative for abdominal pain, diarrhea, nausea and vomiting.  Genitourinary: Positive for pelvic pain and vaginal bleeding. Negative for dysuria and flank pain.  Musculoskeletal: Negative for neck pain and neck stiffness.  Skin: Negative for rash and wound.  Allergic/Immunologic: Negative for immunocompromised state.  Neurological: Negative for syncope, weakness and headaches.  All other systems reviewed and are negative.    Physical Exam Updated Vital Signs BP 125/69   Pulse 69   Resp 16   LMP 12/08/2018 (Approximate)   SpO2 100%   Physical Exam Vitals signs and nursing note reviewed.  Constitutional:      General: She is not in acute distress.    Appearance: She is well-developed.  HENT:     Head: Normocephalic and atraumatic.  Eyes:     Conjunctiva/sclera: Conjunctivae normal.  Neck:     Musculoskeletal: Neck supple.  Cardiovascular:     Rate and Rhythm: Normal rate and regular rhythm.     Heart sounds: Normal heart sounds. No murmur. No friction rub.  Pulmonary:     Effort: Pulmonary effort is normal. No respiratory distress.     Breath sounds: Normal breath sounds. No wheezing or rales.  Abdominal:     General: There is no distension.     Palpations: Abdomen is soft.     Tenderness: There is no abdominal tenderness.     Comments: No abd TTP. Specifically, no RUQ, RLQ pain. No suprapubic TTP.  Genitourinary:    Comments: Pt declined pelvic  exam. Skin:    General: Skin is warm.     Capillary Refill: Capillary refill takes less than 2 seconds.  Neurological:     Mental Status: She is alert and oriented to person, place, and time.     Motor: No abnormal muscle tone.      ED Treatments / Results  Labs (all labs ordered are listed, but only abnormal results are displayed) Labs Reviewed  COMPREHENSIVE METABOLIC PANEL - Abnormal; Notable for the following components:      Result Value   Glucose, Bld 101 (*)    Calcium 8.8 (*)    Total Protein 6.4 (*)    All other components within normal limits  CBC - Abnormal; Notable for the following components:   RBC 3.74 (*)    Hemoglobin 11.2 (*)    HCT 34.1 (*)    All other components within normal limits    EKG None  Radiology No results found.  Procedures Procedures (including critical care time)  Medications Ordered in ED Medications - No data to display   Initial Impression / Assessment and Plan / ED Course  I have reviewed the triage vital signs and the nursing notes.  Pertinent labs & imaging results that were available during my care of the patient were reviewed by me and considered in my medical decision making (see chart for details).     48 year old female here with ongoing dysfunctional uterine bleeding.  Her hemoglobin is decreased from recent values, but is greater than 8 and she is not hypotensive or significantly symptomatic.  Do not feel she needs urgent transfusion.  She does endorse persistent, significant bleeding.  Due to a long wait in the waiting room, patient initially frustrated on my assessment and she is refusing pelvic exam.  However, she has had recent pelvic exam within the last week, and ultrasound as well.  I have a low suspicion for open office or significant cervical abnormality.  Given her ongoing bleeding and decreasing hemoglobin, we discussed risks and benefits of treatment and patient elects for progesterone only treatment at this  time.  Will start her on a 10-day course of Provera.  She has an OB appointment scheduled.  We also discussed her thickened endometrium stripe.  I suspect this is due to dysfunctional uterine bleeding from hormonal changes, endometrial hyperplasia or cancer is on the differential and this was reiterated to her with the need for good OB follow-up.  Final Clinical Impressions(s) / ED Diagnoses   Final diagnoses:  Abnormal uterine bleeding (AUB)    ED Discharge Orders         Ordered    medroxyPROGESTERone (PROVERA) 10 MG tablet  Daily     12/23/18 0851    HYDROcodone-acetaminophen (NORCO/VICODIN) 5-325 MG tablet  Every 6 hours PRN     12/23/18 0851    ondansetron (ZOFRAN ODT) 4 MG disintegrating tablet  Every 8 hours PRN     12/23/18 0851           Duffy Bruce, MD 12/23/18 [phone]

## 2018-12-23 NOTE — ED Notes (Signed)
Pt reports coming to [facility] on 12/19/2018 for heavy vaginal bleeding. Pt reports going to the women's clinic and being given an appointment for February. Pt reports the heavy bleeding has not stopped and she is concerned about same.

## 2018-12-23 NOTE — ED Notes (Signed)
MD Isaacs at bedside with pt.

## 2018-12-23 NOTE — Discharge Instructions (Signed)
Start the hormone treatment TODAY. Your bleeding should lighten in the next 24-48 hours, then stop. You will have a heavier-than-usual period after the 10 days but this should only last as long as a normal period, followed by cessation of bleeding.  I recommend taking Tums or an over-the-counter antacid for your indigestion.

## 2018-12-25 ENCOUNTER — Other Ambulatory Visit: Payer: Self-pay | Admitting: Neurology

## 2018-12-29 NOTE — Telephone Encounter (Signed)
Thanks [name] PA for doing that. Have a good day

## 2018-12-30 ENCOUNTER — Encounter: Payer: Self-pay | Admitting: Adult Health

## 2018-12-30 ENCOUNTER — Ambulatory Visit: Payer: Self-pay | Admitting: Adult Health

## 2018-12-30 VITALS — BP 130/68 | HR 72 | Ht 65.0 in | Wt 133.0 lb

## 2018-12-30 DIAGNOSIS — E785 Hyperlipidemia, unspecified: Secondary | ICD-10-CM

## 2018-12-30 DIAGNOSIS — I10 Essential (primary) hypertension: Secondary | ICD-10-CM

## 2018-12-30 DIAGNOSIS — G459 Transient cerebral ischemic attack, unspecified: Secondary | ICD-10-CM

## 2018-12-30 DIAGNOSIS — G43009 Migraine without aura, not intractable, without status migrainosus: Secondary | ICD-10-CM

## 2018-12-30 MED ORDER — ASPIRIN 81 MG PO TABS: 81.0000 mg | ORAL_TABLET | Freq: Every day | ORAL | 1 refills | Status: DC

## 2018-12-30 MED ORDER — PRAVASTATIN SODIUM 40 MG PO TABS: 40.0000 mg | ORAL_TABLET | Freq: Every day | ORAL | 3 refills | Status: DC

## 2018-12-30 NOTE — Patient Instructions (Signed)
Restart aspirin 81 mg daily  and Pravachol   for secondary stroke prevention  Continue to follow up with PCP regarding cholesterol and blood pressure management   Do not restart Bystolic at this time - after 1-2 weeks, if your headaches have not returned, you can restart bystolic   Follow up with gyno as scheduled for recent uterine bleeding - as this has resolved, it is recommended to restart aspirin for stroke prevention  As your headaches have improved, recommend to hold off on restarting topamax at this time  Continue to monitor blood pressure at home  Maintain strict control of hypertension with blood pressure goal below 130/90, diabetes with hemoglobin A1c goal below 6.5% and cholesterol with LDL cholesterol (bad cholesterol) goal below 70 mg/dL. I also advised the patient to eat a healthy diet with plenty of whole grains, cereals, fruits and vegetables, exercise regularly and maintain ideal body weight.  Followup in the future with me in 6 months or call earlier if needed       Thank you for coming to see [place] at [facility]. I hope we have been able to provide you high quality care today.  You may receive a patient satisfaction survey over the next few weeks. We would appreciate your feedback and comments so that we may continue to improve ourselves and the health of our patients.

## 2018-12-30 NOTE — Progress Notes (Signed)
[name] [address] [place]. [place] [address] [phone]       OFFICE CONSULT NOTE  Carla. Carla [name] Date of Birth:  1970-12-12 Medical Record Number:  [id number]   Referring MD:  Jarold Motto, PA-c Reason for Referral:  headache  Chief Complaint  Patient presents with  . Follow-up    Headache follow up room in back hallway pt with interpreter spanish speaking PT only took topomax for 4 days because the headache went away, pt was told to stop all meds while she was in the ED because of her vaginal bleeding per the MD    HPI: 12/30/18  Carla [name] is being seen today for follow-up visit for migraines and is accompanied by interpreter as patient is primarily Spanish-speaking.  Upon initial visit with Dr. Pearlean Brownie on 09/29/2018 (visit info below), headaches were not frequent enough to justify initiating migraine prophylaxis.  She did call back on 12/02/2018 reporting daily migraines and therefore initiated Topamax 25 mg twice daily.  She took this medication for 4 days but discontinued as medication was making her too fatigued.  She consider taking 1 tab at night only but wanted to speak with [place] in regards to this first.  She was evaluated in the ED on 12/19/2018 and returned on 12/23/2018 due to vaginal bleeding.  Apparently, all medications discontinued after 12/23/2018 visit and was started on Provera for 10-day course with recommending follow-up with OB/GYN for further evaluation.  She reports today that her headaches have improved and not experience a headache since ED admission on 12/23/2018.  She does have an appointment scheduled at [facility] on 01/12/2019 for further evaluation.  She denies any continued vaginal bleeding at this time.  Blood pressure today satisfactory 130/68.  It was recommended after prior visit to undergo transcranial Doppler with bubble study to assess for possible PFO.  This was obtained on 10/14/2018 and was negative for  PFO.    HISTORY SUMMARY: Initial visit 09/29/2018 Dr. Pearlean Brownie:  Carla [name] is a pleasant 9 year Hispanic [name] originally from [place] who was seen today for evaluation for headaches.  She is accompanied by her friend as well as Spanish language interpreter who provides translation for this visit.  History is obtained from them, review of electronic medical records and have personally reviewed imaging films.  Patient has been complaining of increasing headaches for the last 2 months.  She describes this headache as being right temporal and frontal mostly throbbing in nature though fluctuating in severity from 6/10 to 10/10.  Headache is accompanied by nausea vomiting light and sound sensitivity.  She has to stop her activities and lie down and sleeping as well as taking Tylenol seems to help.  Headaches are accompanied by a single vision spots and scintillations and blurred vision.  She denies any speech difficulties, vertigo, diplopia, gait or balance difficulties with headaches.  Headaches usually last about 30 to 40 minutes.  Headaches of late have been occurring about 2-3 times per week.  She does give a remote history of infrequent headaches which sound like migraine headaches and was similar but were mostly perimenstrual and not bad.  The patient also states in September 2019 she had a episode of speech difficulties as well as dizziness.  She was seen by primary care physician who states that the patient had a headache as well as feeling of dizziness and some speech difficulties.  This happened following a stressful event.  An outpatient MRI was ordered which was  done on 08/05/2018 which I personally reviewed shows a few nonspecific white matter hyperintensities but no acute abnormality or definite stroke.  Patient states her symptoms improved within a few days but the headaches have persisted since then.  She actually has a history of cryptogenic stroke in November 2016 for which I evaluated her.MRI  scan of the brain on 11/16/2016showed traced diffusion signal in the posterior left MCA territory consistent with MCA branch infarct. MRA of the brain showed slight diminished peripheral branches in the left MCA but no large vessel stenosis or occlusion. Carotid ultrasound showed no significant extracranial stenosis. Transthoracic echo showed normal ejection fraction. Transesophageal echocardiogram showed no cardiac source of embolism or PFO. Antiphospholipid antibodies and lupus anticoagulant were both negative. Lipid profile showed elevated total cholesterol 207 and LDL 139 mg percent. Hemoglobin A1c was 5.6. Patient was started on aspirin as well as protocol. She states her speech difficulty and right-sided weakness completely improved.  She was placed on aspirin and pravastatin which she actually did well on but for unclear reason she discontinued both medications.  She is currently on Lipitor 40 which was started a few months ago by primary physician but she is complaining of dizziness on this medication and wants to change it or stop it.  She states she had slight balance difficulties from her previous stroke but had recovered well from that.  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  She states her blood pressure is under good control.  Last lipid profile checked in August 2019 showed LDL of 120 following which Lipitor was added.  I had recommended doing a transcranial Doppler bubble study at last visit with me in 2017 but for some unclear reason this was never done..  ROS:   14 system review of systems is positive for no complaints and all other systems negative  PMH:  Past Medical History:  Diagnosis Date  . Depression   . Frequent headaches   . 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 Hyperlipidemia   . Stroke [facility])    09/2015    Social History:  Social History   Socioeconomic History  . Marital status: Married    Spouse name: Not on file  . Number of  children: Not on file  . Years of education: Not on file  . Highest education level: Not on file  Occupational History  . Occupation: Financial trader  Social Needs  . Financial resource strain: Not on file  . Food insecurity:    Worry: Not on file    Inability: Not on file  . Transportation needs:    Medical: Not on file    Non-medical: Not on file  Tobacco Use  . Smoking status: Never Smoker  . Smokeless tobacco: Never Used  Substance and Sexual Activity  . Alcohol use: No  . Drug use: No  . Sexual activity: Not Currently    Birth control/protection: None  Lifestyle  . Physical activity:    Days per week: Not on file    Minutes per session: Not on file  . Stress: Not on file  Relationships  . Social connections:    Talks on phone: Not on file    Gets together: Not on file    Attends religious service: Not on file    Active member of club or organization: Not on file    Attends meetings of clubs or organizations: Not on file    Relationship status: Not on file  .  Intimate partner violence:    Fear of current or ex partner: Not on file    Emotionally abused: Not on file    Physically abused: Not on file    Forced sexual activity: Not on file  Other Topics Concern  . Not on file  Social History Narrative   From [place]   Lived in [place] for 20 years, and been in [place] for 6 years   Husband and son, 19   Works: clean homes    Medications:   Current Outpatient Medications on File Prior to Visit  Medication Sig Dispense Refill  . medroxyPROGESTERone (PROVERA) 10 MG tablet Take 1 tablet (10 mg total) by mouth daily for 10 days. 10 tablet 0   No current facility-administered medications on file prior to visit.     Allergies:   Allergies  Allergen Reactions  . Tramadol Nausea Only    Physical Exam General: well developed, well nourished pleasant middle-aged Hispanic [name], seated, in no evident distress Head: head normocephalic and atraumatic.   Neck: supple with  no carotid or supraclavicular bruits Cardiovascular: regular rate and rhythm, no murmurs Musculoskeletal: no deformity Skin:  no rash/petichiae Vascular:  Normal pulses all extremities  Neurologic Exam Mental Status: awake and fully alert. Oriented to place and time. Recent and remote memory intact. Attention span, concentration and fund of knowledge appropriate. Mood and affect appropriate.  Cranial Nerves: Pupils equal, briskly reactive to light. Extraocular movements full without nystagmus. Visual fields full to confrontation. Hearing intact. Facial sensation intact. Face, tongue, palate moves normally and symmetrically.  Motor: Normal bulk and tone. Normal strength in all tested extremity muscles. Sensory.: intact to touch , pinprick , position and vibratory sensation.  Coordination: Rapid alternating movements normal in all extremities. Finger-to-nose and heel-to-shin performed accurately bilaterally. Gait and Station: Arises from chair without difficulty. Stance is normal. Gait demonstrates normal stride length and balance . Able to heel, toe and tandem walk without difficulty.  Reflexes: 1+ and symmetric. Toes downgoing.      ASSESSMENT:  Carla [name] is a 48 year old Hispanic [name] with with PMH of cryptogenic stroke 2016, TIA, HDL and HTN who is being followed in this office with complaints of new onset migraines.  Recent ED admission for abnormal uterine bleeding and recommended to discontinue all medications and was started on Provera for 10-day course.  Initiated Topamax but unable to tolerate due to fatigue.  Abnormal uterine bleeding has since subsided.     PLAN:  -Restart aspirin 81 mg and potential for secondary stroke prevention -low-dose aspirin not likely because of her uterine bleeding and as this has subsided, recommended to restart with history of stroke and risk factors -As all headaches resolved since discontinuing all medications, discussion regarding possibility of 1  of her medications causing these headaches.  Recommended to slowly start reinitiating medications with the assistance of her PCP.  Recommended not to restart Topamax at this time due to lack of reported headaches -BP stable with patient reporting stable monitoring at home therefore advised to hold off on restarting Bystolic at this time until she is evaluated by her PCP -Highly encourage scheduling PCP visit soon -Follow-up with OB/GYN as scheduled in 12/2018 -maintain strict control of hypertension with blood pressure goal below 130/90, diabetes with hemoglobin A1c goal below 6.5% and lipids with LDL cholesterol goal below 70 mg/dL. I also advised the patient to eat a healthy diet with plenty of whole grains, cereals, fruits and vegetables, exercise regularly and maintain ideal body  weight .   Follow-up in 6 months or call earlier if needed  Greater than 50% time during this 25-minute visit was spent on counseling and coordination of care about her migraine headaches, remote stroke and TIA, recent uterine bleeding, and answering questions with assistance of interpreter  George Hugh, AGNP-BC  [facility] [address] [place], [place] [address]  Phone [phone] Fax [phone] Note: This document was prepared with digital dictation and possible smart phrase technology. Any transcriptional errors that result from this process are unintentional.

## 2018-12-31 ENCOUNTER — Encounter: Payer: Self-pay | Admitting: Adult Health

## 2018-12-31 NOTE — Progress Notes (Signed)
I agree with the above plan 

## 2019-01-13 ENCOUNTER — Ambulatory Visit (INDEPENDENT_AMBULATORY_CARE_PROVIDER_SITE_OTHER): Payer: Self-pay | Admitting: Obstetrics & Gynecology

## 2019-01-13 ENCOUNTER — Encounter: Payer: Self-pay | Admitting: Obstetrics & Gynecology

## 2019-01-13 VITALS — BP 145/91 | HR 78 | Wt 132.1 lb

## 2019-01-13 DIAGNOSIS — N938 Other specified abnormal uterine and vaginal bleeding: Secondary | ICD-10-CM

## 2019-01-13 DIAGNOSIS — N951 Menopausal and female climacteric states: Secondary | ICD-10-CM

## 2019-01-13 DIAGNOSIS — N83209 Unspecified ovarian cyst, unspecified side: Secondary | ICD-10-CM

## 2019-01-13 NOTE — Progress Notes (Signed)
Non OB ultrasound scheduled for 3/19 @ 3:00pm. Pt made aware.

## 2019-01-13 NOTE — Progress Notes (Signed)
Subjective:    Patient ID: Carla [name], female    DOB: [birth year], 48 y.o.   MRN: [id number]  HPI 48 yo married P2 (15 and 3 yo kids) here today for follow up after going to the ED twice for irregular bleeding. She was given provera which stopped the bleeding. She stopped taking the provera about 2 weeks ago and has not bled since.  She has mood swings, vaginal dryness, hot flashes, and night sweats.   Review of Systems She is using withdrawal for contraception.     Objective:   Physical Exam Breathing, conversing, and ambulating normally Well nourished, well hydrated Latina, no apparent distress  Live interpretor present for exam Abd- benign      Assessment & Plan:  Perimenopausal bleeding explained to to patient. I rec'd that instead of visiting the ER for irregular bleeding, that she make appt in clinic prn Rec that she see her fam med provider about her BP. I have ordered an u/s to follow up the ovarian cyst seen on u/s (almost certainly benign)

## 2019-01-15 ENCOUNTER — Ambulatory Visit: Payer: Medicaid Other | Admitting: Physician Assistant

## 2019-01-15 ENCOUNTER — Encounter: Payer: Self-pay | Admitting: Physician Assistant

## 2019-01-15 VITALS — BP 140/90 | HR 90 | Temp 98.3°F | Ht 65.0 in | Wt 133.0 lb

## 2019-01-15 DIAGNOSIS — I10 Essential (primary) hypertension: Secondary | ICD-10-CM

## 2019-01-15 DIAGNOSIS — E785 Hyperlipidemia, unspecified: Secondary | ICD-10-CM

## 2019-01-15 MED ORDER — AMLODIPINE BESYLATE 5 MG PO TABS: 5.0000 mg | ORAL_TABLET | Freq: Every day | ORAL | 0 refills | Status: DC

## 2019-01-15 NOTE — Patient Instructions (Signed)
It was great to see you!  Stop bystolic. Stop pravachol.  Start 5mg  Norvasc daily.  We will discuss restarting cholesterol medication at next visit. For now, hold off.  Continue aspirin.  Let's follow-up in 2 weeks, sooner if you have concerns.  Take care,  [name] PA-C

## 2019-01-15 NOTE — Progress Notes (Signed)
Carla [name] is a 48 y.o. female here for a follow up of a pre-existing problem.  History of Present Illness:   Chief Complaint  Patient presents with  . Medication making her feel bad    Pt explains that her Pravastatin makes the lt side neck swollen and painful.  Pt said that her Bystolic gives her a painful headache when she take it.  Pt has not taken any of the two meds in 2 days.    HPI   In-person interpreter utilized today.  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 During her stroke work-up she had an echo performed that showed normal EF, grade 1 diastolic dysfunction and mild aortic valve regurgitation, mild LVH. She was maintained on ASA and pravastatin for secondary prevention. She went to a cardiologist after episode of PVCs in May 2017 and was started on Lopressor 12.5 mg BID. Holter monitor at that time showed 15% PVCs.   Per last visit with cardiology pharmacist was June 2017. Per that note: "She has tried 3 different medications (metoprolol, nebivolol and diltiazem).  Of the 3, she believes the metoprolol caused her the least problems.  She felt dizziness often after taking nebivolol and she just didn't feel good with the diltiazem.  Will have her continue on with the metoprolol 12.5 mg bid."  She was taking Bystolic but when she went to Neurology on 12/30/2018 she was having HA and was told to stop this until she saw me. She has not been doing this, she states that she was told to stay on it for two more weeks. Last took Bystolic last Wednesday and had a HA. She stopped it. Has not had HA since.  She also reports that the day after taking cholesterol medication regularly, she has bilateral upper neck pain and "pain with swallowing" and "irritation."  This started about three weeks ago. She stopped the medication on Wednesday but is still having symptoms. Feels like "blood flow" isn't traveling to her brain properly. She reports that she is having difficulty swallowing,  not with foods or liquids, but with her own saliva. Denies family hx of esophageal cancer. No personal weight loss.    BP Readings from Last 3 Encounters:  01/15/19 140/90  01/13/19 (!) 145/91  12/30/18 130/68    Wt Readings from Last 4 Encounters:  01/15/19 133 lb (60.3 kg)  01/13/19 132 lb 1.6 oz (59.9 kg)  12/30/18 133 lb (60.3 kg)  12/21/18 134 lb 4 oz (60.9 kg)     Past Medical History:  Diagnosis Date  . Depression   . Frequent headaches   . 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 Hyperlipidemia   . Hypertension   . Stroke [facility])    09/2015     Social History   Socioeconomic History  . Marital status: Married    Spouse name: Not on file  . Number of children: Not on file  . Years of education: Not on file  . Highest education level: Not on file  Occupational History  . Occupation: Furniture conservator/restorer  Social Needs  . Financial resource strain: Not on file  . Food insecurity:    Worry: Not on file    Inability: Not on file  . Transportation needs:    Medical: Not on file    Non-medical: Not on file  Tobacco Use  . Smoking status: Never Smoker  . Smokeless tobacco: Never Used  Substance and Sexual Activity  . Alcohol use: No  . Drug  use: No  . Sexual activity: Not Currently    Birth control/protection: None  Lifestyle  . Physical activity:    Days per week: Not on file    Minutes per session: Not on file  . Stress: Not on file  Relationships  . Social connections:    Talks on phone: Not on file    Gets together: Not on file    Attends religious service: Not on file    Active member of club or organization: Not on file    Attends meetings of clubs or organizations: Not on file    Relationship status: Not on file  . Intimate partner violence:    Fear of current or ex partner: Not on file    Emotionally abused: Not on file    Physically abused: Not on file    Forced sexual activity: Not on file  Other Topics Concern  . Not on file  Social History Narrative    From [place]   Lived in [place] for 20 years, and been in [place] for 6 years   Husband and son, 67   Works: clean homes    Past Surgical History:  Procedure Laterality Date  . RIGHT ARM SURGERY    . TEE WITHOUT CARDIOVERSION N/A 10/13/2015   Procedure: TRANSESOPHAGEAL ECHOCARDIOGRAM (TEE);  Surgeon: Lelon Perla, MD;  Location: [facility] ENDOSCOPY;  Service: Cardiovascular;  Laterality: N/A;    Family History  Problem Relation Age of Onset  . Leukemia Mother   . Colon cancer Brother   . Asthma Neg Hx   . Cancer Neg Hx   . Diabetes Neg Hx   . Hyperlipidemia Neg Hx   . Heart failure Neg Hx   . Hypertension Neg Hx   . Migraines Neg Hx   . Rashes / Skin problems Neg Hx   . Seizures Neg Hx   . Stroke Neg Hx   . Thyroid disease Neg Hx     Allergies  Allergen Reactions  . Tramadol Nausea Only    Current Medications:   Current Outpatient Medications:  .  aspirin 81 MG tablet, Take 1 tablet (81 mg total) by mouth daily., Disp: 30 tablet, Rfl: 1 .  amLODipine (NORVASC) 5 MG tablet, Take 1 tablet (5 mg total) by mouth daily., Disp: 30 tablet, Rfl: 0 .  HYDROcodone-acetaminophen (NORCO/VICODIN) 5-325 MG tablet, Take 1 tablet by mouth every 6 (six) hours as needed for moderate pain., Disp: , Rfl:  .  nebivolol (BYSTOLIC) 5 MG tablet, Take 5 mg by mouth daily., Disp: , Rfl:  .  pravastatin (PRAVACHOL) 40 MG tablet, Take 1 tablet (40 mg total) by mouth daily. (Patient not taking: Reported on 01/15/2019), Disp: 30 tablet, Rfl: 3   Review of Systems:   Review of Systems  Constitutional: Negative for chills, fever, malaise/fatigue and weight loss.  Respiratory: Negative for shortness of breath.   Cardiovascular: Negative for chest pain, orthopnea, claudication and leg swelling.  Gastrointestinal: Negative for heartburn, nausea and vomiting.  Neurological: Positive for headaches. Negative for dizziness and tingling.  Psychiatric/Behavioral: Negative for depression. The patient is not  nervous/anxious.     Vitals:   Vitals:   01/15/19 1448  BP: 140/90  Pulse: 90  Temp: 98.3 F (36.8 C)  TempSrc: Oral  SpO2: 100%  Weight: 133 lb (60.3 kg)  Height: 5\' 5"  (1.651 m)     Body mass index is 22.13 kg/m.  Physical Exam:   Physical Exam Vitals signs and nursing note reviewed.  Constitutional:      General: She is not in acute distress.    Appearance: She is well-developed. She is not ill-appearing or toxic-appearing.  HENT:     Head: Normocephalic and atraumatic.     Right Ear: Tympanic membrane, ear canal and external ear normal. Tympanic membrane is not erythematous, retracted or bulging.     Left Ear: Tympanic membrane, ear canal and external ear normal. Tympanic membrane is not erythematous, retracted or bulging.     Nose: Nose normal.     Right Sinus: No maxillary sinus tenderness or frontal sinus tenderness.     Left Sinus: No maxillary sinus tenderness or frontal sinus tenderness.     Mouth/Throat:     Pharynx: Uvula midline. No posterior oropharyngeal erythema.  Eyes:     General: Lids are normal.     Conjunctiva/sclera: Conjunctivae normal.  Neck:     Trachea: Trachea normal.  Cardiovascular:     Rate and Rhythm: Normal rate and regular rhythm.     Heart sounds: Normal heart sounds, S1 normal and S2 normal.  Pulmonary:     Effort: Pulmonary effort is normal.     Breath sounds: Normal breath sounds. No decreased breath sounds, wheezing, rhonchi or rales.  Lymphadenopathy:     Cervical: No cervical adenopathy.  Skin:    General: Skin is warm and dry.  Neurological:     Mental Status: She is alert.  Psychiatric:        Speech: Speech normal.        Behavior: Behavior normal. Behavior is cooperative.      Assessment and Plan:   Tauri was seen today for medication making her feel bad.  Diagnoses and all orders for this visit:  Essential hypertension Long discussion regarding this. BP log given. Follow-up in 2 weeks. Stay off bystolic.  Start 5 mg Norvasc daily. Return sooner if develops side effects or has concerns.  Hyperlipidemia, unspecified hyperlipidemia type She feels like she tolerated Lipitor better. Will have her stay off cholesterol medication presently and then when she returns in two weeks will consider restarting to make sure she doesn't have side effects from both new norvasc and restarting of lipitor. Continue ASA.  Other orders -     amLODipine (NORVASC) 5 MG tablet; Take 1 tablet (5 mg total) by mouth daily.    . Reviewed expectations re: course of current medical issues. . Discussed self-management of symptoms. . Outlined signs and symptoms indicating need for more acute intervention. . Patient verbalized understanding and all questions were answered. . See orders for this visit as documented in the electronic medical record. . Patient received an After-Visit Summary.   [name], PA-C

## 2019-01-21 ENCOUNTER — Ambulatory Visit (HOSPITAL_COMMUNITY): Payer: Medicaid Other

## 2019-01-29 ENCOUNTER — Ambulatory Visit: Payer: Medicaid Other | Admitting: Physician Assistant

## 2019-02-02 ENCOUNTER — Encounter: Payer: Self-pay | Admitting: Physician Assistant

## 2019-02-02 ENCOUNTER — Ambulatory Visit (INDEPENDENT_AMBULATORY_CARE_PROVIDER_SITE_OTHER): Payer: Medicaid Other | Admitting: Physician Assistant

## 2019-02-02 VITALS — BP 120/78 | HR 108 | Temp 99.3°F | Ht 65.0 in | Wt 131.5 lb

## 2019-02-02 DIAGNOSIS — I10 Essential (primary) hypertension: Secondary | ICD-10-CM

## 2019-02-02 DIAGNOSIS — E785 Hyperlipidemia, unspecified: Secondary | ICD-10-CM

## 2019-02-02 DIAGNOSIS — I639 Cerebral infarction, unspecified: Secondary | ICD-10-CM

## 2019-02-02 MED ORDER — AMLODIPINE BESYLATE 5 MG PO TABS: 5.0000 mg | ORAL_TABLET | Freq: Every day | ORAL | 0 refills | Status: DC

## 2019-02-02 MED ORDER — ATORVASTATIN CALCIUM 20 MG PO TABS: 20.0000 mg | ORAL_TABLET | Freq: Every day | ORAL | 1 refills | Status: DC

## 2019-02-02 NOTE — Progress Notes (Signed)
Carla [name] is a 48 y.o. female here for a follow up of a pre-existing problem.   History of Present Illness:   Chief Complaint  Patient presents with  . Hypertension    follow up    HPI  HTN -- Currently taking Norvasc 5 mg. At home blood pressure readings are: 120's over 80's. Patient denies chest pain, SOB, blurred vision, dizziness, unusual headaches, lower leg swelling. Patient is  compliant with medication. Denies excessive caffeine intake, stimulant usage, excessive alcohol intake, or increase in salt consumption.  HLD -- she is ready to discuss restarting Lipitor. She is due for lipid panel today.   Past Medical History:  Diagnosis Date  . Depression   . Frequent headaches   . 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 Hyperlipidemia   . Hypertension   . Stroke [facility])    09/2015     Social History   Socioeconomic History  . Marital status: Married    Spouse name: Not on file  . Number of children: Not on file  . Years of education: Not on file  . Highest education level: Not on file  Occupational History  . Occupation: Furniture conservator/restorer  Social Needs  . Financial resource strain: Not on file  . Food insecurity:    Worry: Not on file    Inability: Not on file  . Transportation needs:    Medical: Not on file    Non-medical: Not on file  Tobacco Use  . Smoking status: Never Smoker  . Smokeless tobacco: Never Used  Substance and Sexual Activity  . Alcohol use: No  . Drug use: No  . Sexual activity: Not Currently    Birth control/protection: None  Lifestyle  . Physical activity:    Days per week: Not on file    Minutes per session: Not on file  . Stress: Not on file  Relationships  . Social connections:    Talks on phone: Not on file    Gets together: Not on file    Attends religious service: Not on file    Active member of club or organization: Not on file    Attends meetings of clubs or organizations: Not on file    Relationship status: Not on file  . Intimate  partner violence:    Fear of current or ex partner: Not on file    Emotionally abused: Not on file    Physically abused: Not on file    Forced sexual activity: Not on file  Other Topics Concern  . Not on file  Social History Narrative   From [place]   Lived in [place] for 20 years, and been in [place] for 6 years   Husband and son, 86   Works: clean homes    Past Surgical History:  Procedure Laterality Date  . RIGHT ARM SURGERY    . TEE WITHOUT CARDIOVERSION N/A 10/13/2015   Procedure: TRANSESOPHAGEAL ECHOCARDIOGRAM (TEE);  Surgeon: Lelon Perla, MD;  Location: [facility] ENDOSCOPY;  Service: Cardiovascular;  Laterality: N/A;    Family History  Problem Relation Age of Onset  . Leukemia Mother   . Colon cancer Brother   . Asthma Neg Hx   . Cancer Neg Hx   . Diabetes Neg Hx   . Hyperlipidemia Neg Hx   . Heart failure Neg Hx   . Hypertension Neg Hx   . Migraines Neg Hx   . Rashes / Skin problems Neg Hx   . Seizures Neg Hx   .  Stroke Neg Hx   . Thyroid disease Neg Hx     Allergies  Allergen Reactions  . Tramadol Nausea Only    Current Medications:   Current Outpatient Medications:  .  amLODipine (NORVASC) 5 MG tablet, Take 1 tablet (5 mg total) by mouth daily., Disp: 90 tablet, Rfl: 0 .  aspirin 81 MG tablet, Take 1 tablet (81 mg total) by mouth daily., Disp: 30 tablet, Rfl: 1 .  atorvastatin (LIPITOR) 20 MG tablet, Take 1 tablet (20 mg total) by mouth daily., Disp: 90 tablet, Rfl: 1 .  HYDROcodone-acetaminophen (NORCO/VICODIN) 5-325 MG tablet, Take 1 tablet by mouth every 6 (six) hours as needed for moderate pain., Disp: , Rfl:    Review of Systems:   ROS    Vitals:   Vitals:   02/02/19 1430  BP: 120/78  Pulse: (!) 108  Temp: 99.3 F (37.4 C)  TempSrc: Oral  SpO2: 98%  Weight: 131 lb 8 oz (59.6 kg)  Height: 5\' 5"  (1.651 m)     Body mass index is 21.88 kg/m.  Physical Exam:   Physical Exam Vitals signs and nursing note reviewed.  Constitutional:       General: She is not in acute distress.    Appearance: She is well-developed. She is not ill-appearing or toxic-appearing.  Cardiovascular:     Rate and Rhythm: Normal rate and regular rhythm.     Pulses: Normal pulses.     Heart sounds: Normal heart sounds, S1 normal and S2 normal.     Comments: No LE edema Pulmonary:     Effort: Pulmonary effort is normal.     Breath sounds: Normal breath sounds.  Skin:    General: Skin is warm and dry.  Neurological:     Mental Status: She is alert.     GCS: GCS eye subscore is 4. GCS verbal subscore is 5. GCS motor subscore is 6.  Psychiatric:        Speech: Speech normal.        Behavior: Behavior normal. Behavior is cooperative.     Assessment and Plan:   [name] was seen today for hypertension.  Diagnoses and all orders for this visit:  Cryptogenic stroke ([place]) -     Lipid panel  Hyperlipidemia, unspecified hyperlipidemia type Re-check lipid panel today. Restart Lipitor 20 mg. Follow-up in 2-4 weeks to see if having side effects.  Essential hypertension Well controlled. Continue Norvasc 5 mg. Follow-up in 2-4 weeks, sooner if issues.  Other orders -     atorvastatin (LIPITOR) 20 MG tablet; Take 1 tablet (20 mg total) by mouth daily. -     amLODipine (NORVASC) 5 MG tablet; Take 1 tablet (5 mg total) by mouth daily.    . Reviewed expectations re: course of current medical issues. . Discussed self-management of symptoms. . Outlined signs and symptoms indicating need for more acute intervention. . Patient verbalized understanding and all questions were answered. . See orders for this visit as documented in the electronic medical record. . Patient received an After-Visit Summary.   Carla Coke, PA-C

## 2019-02-02 NOTE — Patient Instructions (Signed)
It was great to see you!  1. Continue Norvasc 5 mg daily 2. Re-start Lipitor 20 mg daily  We will be in touch with lab work. Please check blood pressure a few times a week and bring for [place] to review at next visit.  Let's follow-up in 2-4 weeks, sooner if you have concerns.  Take care,  [name] PA-C

## 2019-02-03 LAB — LIPID PANEL
Cholesterol: 167 mg/dL (ref 0–200)
HDL: 56.4 mg/dL (ref >39.00)
LDL Cholesterol: 92 mg/dL (ref 0–99)
NonHDL: 110.6
Total CHOL/HDL Ratio: 3
Triglycerides: 91 mg/dL (ref 0.0–149.0)
VLDL: 18.2 mg/dL (ref 0.0–40.0)

## 2019-02-11 ENCOUNTER — Ambulatory Visit (HOSPITAL_COMMUNITY)
Admission: RE | Admit: 2019-02-11 | Discharge: 2019-02-11 | Disposition: A | Discharge status: Home / Self Care | Payer: Self-pay | Source: Ambulatory Visit | Attending: Obstetrics & Gynecology | Admitting: Obstetrics & Gynecology

## 2019-02-11 ENCOUNTER — Other Ambulatory Visit: Payer: Self-pay

## 2019-02-11 DIAGNOSIS — N83209 Unspecified ovarian cyst, unspecified side: Secondary | ICD-10-CM | POA: Insufficient documentation

## 2019-02-17 ENCOUNTER — Telehealth: Payer: Self-pay | Admitting: Physician Assistant

## 2019-02-17 NOTE — Telephone Encounter (Signed)
Please call pt and schedule Webex for Tuesday morning with [facility] and pt will need interpreter. Pt is scheduled for Tuesday afternoon but Aldona Bar will not be here.

## 2019-02-17 NOTE — Telephone Encounter (Signed)
Pt is having joint ache, not sleeping, neck swollen, just does not feel well.  requesting to see [facility] asap, and not wait until next week. Pt on schedule for tomorrow

## 2019-02-17 NOTE — Telephone Encounter (Signed)
Copied from [place] [id number]. Topic: Quick Communication - See Telephone Encounter >> Feb 17, 2019  1:44 PM Bea Graff, NT wrote: CRM for notification. See Telephone encounter for: 02/17/19. Pt states she would like to speak with Aldona Bar as she believes the atorvastatin (LIPITOR) 20 MG tablet is not working for her.

## 2019-02-17 NOTE — Telephone Encounter (Signed)
Noted  

## 2019-02-18 ENCOUNTER — Ambulatory Visit: Payer: Medicaid Other | Admitting: Physician Assistant

## 2019-02-19 ENCOUNTER — Encounter: Payer: Self-pay | Admitting: Physician Assistant

## 2019-02-19 ENCOUNTER — Other Ambulatory Visit: Payer: Self-pay

## 2019-02-19 ENCOUNTER — Ambulatory Visit (INDEPENDENT_AMBULATORY_CARE_PROVIDER_SITE_OTHER): Payer: Medicaid Other | Admitting: Physician Assistant

## 2019-02-19 VITALS — BP 140/84 | HR 83 | Temp 98.5°F | Ht 65.0 in | Wt 129.4 lb

## 2019-02-19 DIAGNOSIS — E785 Hyperlipidemia, unspecified: Secondary | ICD-10-CM

## 2019-02-19 DIAGNOSIS — I10 Essential (primary) hypertension: Secondary | ICD-10-CM

## 2019-02-19 DIAGNOSIS — R221 Localized swelling, mass and lump, neck: Secondary | ICD-10-CM

## 2019-02-19 NOTE — Progress Notes (Signed)
Carla [name] is a 48 y.o. female is here to discuss: Cholesterol medication and edema in neck.  I acted as a Education administrator for [employer], PA-C Anselmo Pickler, LPN  History of Present Illness:   Chief Complaint  Patient presents with  . Hyperlipidemia  . Neck edema    HPI  Neck edema Pt c/o neck edema since started on Amlodipine and Atorvastatin. Right side > left. Pt says throat is itchy. Denies swelling of lips or tongue. Pt has not taken any of her medications x 2 days and has had some improvement of symptoms.  Blood pressures at home are well controlled -- 120s/70s.  Denies excessive caffeine intake, stimulant usage, excessive alcohol intake, or increase in salt consumption.    Health Maintenance Due  Topic Date Due  . PAP SMEAR-Modifier  04/09/2019    Past Medical History:  Diagnosis Date  . Depression   . Frequent headaches   . 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 Hyperlipidemia   . Hypertension   . Stroke [facility])    09/2015     Social History   Socioeconomic History  . Marital status: Married    Spouse name: Not on file  . Number of children: Not on file  . Years of education: Not on file  . Highest education level: Not on file  Occupational History  . Occupation: Furniture conservator/restorer  Social Needs  . Financial resource strain: Not on file  . Food insecurity:    Worry: Not on file    Inability: Not on file  . Transportation needs:    Medical: Not on file    Non-medical: Not on file  Tobacco Use  . Smoking status: Never Smoker  . Smokeless tobacco: Never Used  Substance and Sexual Activity  . Alcohol use: No  . Drug use: No  . Sexual activity: Not Currently    Birth control/protection: None  Lifestyle  . Physical activity:    Days per week: Not on file    Minutes per session: Not on file  . Stress: Not on file  Relationships  . Social connections:    Talks on phone: Not on file    Gets together: Not on file    Attends religious service: Not on file   Active member of club or organization: Not on file    Attends meetings of clubs or organizations: Not on file    Relationship status: Not on file  . Intimate partner violence:    Fear of current or ex partner: Not on file    Emotionally abused: Not on file    Physically abused: Not on file    Forced sexual activity: Not on file  Other Topics Concern  . Not on file  Social History Narrative   From [place]   Lived in [place] for 20 years, and been in [place] for 6 years   Husband and son, 76   Works: clean homes    Past Surgical History:  Procedure Laterality Date  . RIGHT ARM SURGERY    . TEE WITHOUT CARDIOVERSION N/A 10/13/2015   Procedure: TRANSESOPHAGEAL ECHOCARDIOGRAM (TEE);  Surgeon: Lelon Perla, MD;  Location: [facility] ENDOSCOPY;  Service: Cardiovascular;  Laterality: N/A;    Family History  Problem Relation Age of Onset  . Leukemia Mother   . Colon cancer Brother   . Asthma Neg Hx   . Cancer Neg Hx   . Diabetes Neg Hx   . Hyperlipidemia Neg Hx   . Heart failure  Neg Hx   . Hypertension Neg Hx   . Migraines Neg Hx   . Rashes / Skin problems Neg Hx   . Seizures Neg Hx   . Stroke Neg Hx   . Thyroid disease Neg Hx     PMHx, SurgHx, SocialHx, FamHx, Medications, and Allergies were reviewed in the Visit Navigator and updated as appropriate.   Patient Active Problem List   Diagnosis Date Noted  . Left knee pain 01/08/2018  . 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 Premature ventricular contractions 03/25/2016  . Hyperlipidemia 03/25/2016  . Benign paroxysmal positional vertigo 03/11/2016  . Neck pain on right side 02/19/2016  . Cryptogenic stroke ([place]) 12/28/2015  . Multiple hemangiomas   . Cerebral infarction due to unspecified mechanism   . 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 Migraines 10/11/2015    Social History   Tobacco Use  . Smoking status: Never Smoker  . Smokeless tobacco: Never Used  Substance Use Topics  . Alcohol use: No  . Drug use: No    Current  Medications and Allergies:    Current Outpatient Medications:  .  amLODipine (NORVASC) 5 MG tablet, Take 1 tablet (5 mg total) by mouth daily., Disp: 90 tablet, Rfl: 0 .  aspirin 81 MG tablet, Take 1 tablet (81 mg total) by mouth daily., Disp: 30 tablet, Rfl: 1 .  atorvastatin (LIPITOR) 20 MG tablet, Take 1 tablet (20 mg total) by mouth daily., Disp: 90 tablet, Rfl: 1   Allergies  Allergen Reactions  . Tramadol Nausea Only    Review of Systems   Review of Systems  Constitutional: Negative for chills, fever, malaise/fatigue and weight loss.  Respiratory: Negative for shortness of breath.   Cardiovascular: Negative for chest pain, orthopnea, claudication and leg swelling.  Gastrointestinal: Negative for heartburn, nausea and vomiting.  Neurological: Negative for dizziness, tingling and headaches.    Vitals:   Vitals:   02/19/19 1119  BP: 140/84  Pulse: 83  Temp: 98.5 F (36.9 C)  TempSrc: Oral  SpO2: 98%  Weight: 129 lb 6.1 oz (58.7 kg)  Height: 5\' 5"  (1.651 m)     Body mass index is 21.53 kg/m.   Physical Exam:    Physical Exam Vitals signs and nursing note reviewed.  Constitutional:      General: She is not in acute distress.    Appearance: She is well-developed. She is not ill-appearing or toxic-appearing.  HENT:     Head: Normocephalic and atraumatic.     Right Ear: Tympanic membrane, ear canal and external ear normal. Tympanic membrane is not erythematous, retracted or bulging.     Left Ear: Tympanic membrane, ear canal and external ear normal. Tympanic membrane is not erythematous, retracted or bulging.     Nose: Nose normal.     Right Sinus: No maxillary sinus tenderness or frontal sinus tenderness.     Left Sinus: No maxillary sinus tenderness or frontal sinus tenderness.     Mouth/Throat:     Pharynx: Uvula midline. No posterior oropharyngeal erythema.     Comments: No obvious swelling visible on neck. Eyes:     General: Lids are normal.      Conjunctiva/sclera: Conjunctivae normal.  Neck:     Trachea: Trachea normal.  Cardiovascular:     Rate and Rhythm: Normal rate and regular rhythm.     Heart sounds: Normal heart sounds, S1 normal and S2 normal.  Pulmonary:     Effort: Pulmonary effort is normal.  Breath sounds: Normal breath sounds. No decreased breath sounds, wheezing, rhonchi or rales.  Lymphadenopathy:     Cervical: No cervical adenopathy.  Skin:    General: Skin is warm and dry.  Neurological:     Mental Status: She is alert.  Psychiatric:        Speech: Speech normal.        Behavior: Behavior normal. Behavior is cooperative.      Assessment and Plan:    Regine was seen today for hyperlipidemia and neck edema.  Diagnoses and all orders for this visit:  Hyperlipidemia, unspecified hyperlipidemia type Long history of intolerance of statins for various reasons. For now, I'm going to have her hold her statin x 1 month to see if symptoms improve. Continue ASA. Follow-up in 1 month.  Essential hypertension Resume Norvasc 5 mg daily. Follow-up in 1 month. Continue BP checks at home.  Neck swelling No red flags on exam. Suspect possible allergy-type response? Start Claritin to see if helps with itching. ER precautions advised.  . Reviewed expectations re: course of current medical issues. . Discussed self-management of symptoms. . Outlined signs and symptoms indicating need for more acute intervention. . Patient verbalized understanding and all questions were answered. . See orders for this visit as documented in the electronic medical record. . Patient received an After Visit Summary.  CMA or LPN served as scribe during this visit. History, Physical, and Plan performed by medical provider. The above documentation has been reviewed and is accurate and complete.  [name], PA-C [place], [place] 02/19/2019  Follow-up: No follow-ups on file.

## 2019-02-19 NOTE — Patient Instructions (Signed)
It was great to see you!  Stop cholesterol medication, lipitor. Continue aspirin and norvasc 5 mg.  Please buy zyrtec or claritin over the counter -- take daily x 2 weeks to help with itching.    Return in 1 month to discuss.    Take care,  [name] PA-C

## 2019-02-23 ENCOUNTER — Ambulatory Visit: Payer: Medicaid Other | Admitting: Physician Assistant

## 2019-03-09 ENCOUNTER — Ambulatory Visit (INDEPENDENT_AMBULATORY_CARE_PROVIDER_SITE_OTHER): Payer: Medicaid Other | Admitting: Physician Assistant

## 2019-03-09 DIAGNOSIS — E785 Hyperlipidemia, unspecified: Secondary | ICD-10-CM

## 2019-03-09 MED ORDER — PRAVASTATIN SODIUM 20 MG PO TABS: 20.0000 mg | ORAL_TABLET | Freq: Every day | ORAL | 1 refills | Status: DC

## 2019-03-09 NOTE — Progress Notes (Signed)
Virtual Visit via Video   I connected with Carla [name] on 03/10/19 at 10:40 AM EDT by a video enabled telemedicine application and verified that I am speaking with the correct person using two identifiers. Location patient: Home Location provider: [employer], Office Persons participating in the virtual visit: [place], [place], [place], [place], [place].   I discussed the limitations of evaluation and management by telemedicine and the availability of in person appointments. The patient expressed understanding and agreed to proceed.  Subjective:   HPI:  Patient would like discuss her cholesterol medication. She has been taking an OTC antihistamine as discussed for our last visit and this helped with her "throat swelling.  She reports that she is ready to go back on her statin. She would like to try a a lower dose of pravastatin.  She is taking her Norvasc and tolerating well. Patient denies chest pain, SOB, blurred vision, dizziness, unusual headaches, lower leg swelling.  Denies excessive caffeine intake, stimulant usage, excessive alcohol intake, or increase in salt consumption.     ROS: See pertinent positives and negatives per HPI.  Patient Active Problem List   Diagnosis Date Noted  . Left knee pain 01/08/2018  . 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 Premature ventricular contractions 03/25/2016  . Hyperlipidemia 03/25/2016  . Benign paroxysmal positional vertigo 03/11/2016  . Neck pain on right side 02/19/2016  . Cryptogenic stroke ([place]) 12/28/2015  . Multiple hemangiomas   . Cerebral infarction due to unspecified mechanism   . 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 Migraines 10/11/2015    Social History   Tobacco Use  . Smoking status: Never Smoker  . Smokeless tobacco: Never Used  Substance Use Topics  . Alcohol use: No    Current Outpatient Medications:  .  amLODipine (NORVASC) 5 MG tablet, Take 1 tablet (5 mg total) by mouth daily., Disp: 90  tablet, Rfl: 0 .  aspirin 81 MG tablet, Take 1 tablet (81 mg total) by mouth daily., Disp: 30 tablet, Rfl: 1 .  atorvastatin (LIPITOR) 20 MG tablet, Take 1 tablet (20 mg total) by mouth daily., Disp: 90 tablet, Rfl: 1 .  pravastatin (PRAVACHOL) 20 MG tablet, Take 1 tablet (20 mg total) by mouth daily., Disp: 30 tablet, Rfl: 1  Allergies  Allergen Reactions  . Lipitor [Atorvastatin Calcium] Swelling    throat  . Tramadol Nausea Only    Objective:   VITALS: Per patient if applicable, see vitals. GENERAL: Alert, appears well and in no acute distress. HEENT: Atraumatic, conjunctiva clear, no obvious abnormalities on inspection of external nose and ears. NECK: Normal movements of the head and neck. CARDIOPULMONARY: No increased WOB. Speaking in clear sentences. I:E ratio WNL.  MS: Moves all visible extremities without noticeable abnormality. PSYCH: Pleasant and cooperative, well-groomed. Speech normal rate and rhythm. Affect is appropriate. Insight and judgement are appropriate. Attention is focused, linear, and appropriate.  NEURO: CN grossly intact. Oriented as arrived to appointment on time with no prompting. Moves both UE equally.  SKIN: No obvious lesions, wounds, erythema, or cyanosis noted on face or hands.  Assessment and Plan:   Diagnoses and all orders for this visit:  Hyperlipidemia, unspecified hyperlipidemia type  Other orders -     pravastatin (PRAVACHOL) 20 MG tablet; Take 1 tablet (20 mg total) by mouth daily.   Restart Pravachol, start at 20 mg daily. Follow-up if any concerns with tolerating medication.  . Reviewed expectations re: course of current medical issues. . Discussed self-management  of symptoms. . Outlined signs and symptoms indicating need for more acute intervention. . Patient verbalized understanding and all questions were answered. Marland Kitchen Health Maintenance issues including appropriate healthy diet, exercise, and smoking avoidance were discussed with  patient. . See orders for this visit as documented in the electronic medical record.  I discussed the assessment and treatment plan with the patient. The patient was provided an opportunity to ask questions and all were answered. The patient agreed with the plan and demonstrated an understanding of the instructions.   The patient was advised to call back or seek an in-person evaluation if the symptoms worsen or if the condition fails to improve as anticipated.  CMA or LPN served as scribe during this visit. History, Physical, and Plan performed by medical provider. The above documentation has been reviewed and is accurate and complete.    [place], [place] 03/10/2019

## 2019-03-10 ENCOUNTER — Telehealth: Payer: Self-pay | Admitting: Neurology

## 2019-03-10 ENCOUNTER — Encounter: Payer: Self-pay | Admitting: Physician Assistant

## 2019-03-10 NOTE — Telephone Encounter (Signed)
Patient walked into the lobby today requesting a call back from nurse regarding medication. She was informed to call the office next time and not come in for her safety and ours. Best call back is [phone]

## 2019-03-15 NOTE — Telephone Encounter (Signed)
Noted. Will route to PCP for FYI. Thank you

## 2019-03-15 NOTE — Telephone Encounter (Signed)
Revised. 

## 2019-03-15 NOTE — Telephone Encounter (Addendum)
I called pt via language line at [phone] for spanish speak interpreter. I stated to interpreter pt had came to the office and was told to call for any issues or concerns. Please do not come to office due to COVID 19. Pt verbalized understanding. I wanted to know what medication she was talking to. The interpreter stated per pt  is on meds prescribed by her PCP and she is having a lot of pain and shortness of breath. Also when she coughs her lungs neck and side of her chest hurts. PT stated she did not know if it was the virus causing this. I advise pt that she needs to seek the nearest ED if she is feeling bad or having issues. I stated to language line we dont treat COVID 19 shortness of breath or lung issues. THe pt verbalized understanding per language line to seek nearest ED.

## 2019-03-17 ENCOUNTER — Emergency Department (HOSPITAL_COMMUNITY)
Admission: EM | Admit: 2019-03-17 | Discharge: 2019-03-17 | Disposition: A | Discharge status: Home / Self Care | Payer: Self-pay | Attending: Emergency Medicine | Admitting: Emergency Medicine

## 2019-03-17 ENCOUNTER — Emergency Department (HOSPITAL_COMMUNITY): Payer: Self-pay

## 2019-03-17 ENCOUNTER — Ambulatory Visit: Payer: Self-pay

## 2019-03-17 ENCOUNTER — Other Ambulatory Visit: Payer: Self-pay

## 2019-03-17 ENCOUNTER — Encounter (HOSPITAL_COMMUNITY): Payer: Self-pay

## 2019-03-17 DIAGNOSIS — S29019A Strain of muscle and tendon of unspecified wall of thorax, initial encounter: Secondary | ICD-10-CM

## 2019-03-17 DIAGNOSIS — Z7982 Long term (current) use of aspirin: Secondary | ICD-10-CM | POA: Insufficient documentation

## 2019-03-17 DIAGNOSIS — S29012A Strain of muscle and tendon of back wall of thorax, initial encounter: Secondary | ICD-10-CM | POA: Insufficient documentation

## 2019-03-17 DIAGNOSIS — Z8673 Personal history of transient ischemic attack (TIA), and cerebral infarction without residual deficits: Secondary | ICD-10-CM | POA: Insufficient documentation

## 2019-03-17 DIAGNOSIS — X58XXXA Exposure to other specified factors, initial encounter: Secondary | ICD-10-CM | POA: Insufficient documentation

## 2019-03-17 DIAGNOSIS — Z79899 Other long term (current) drug therapy: Secondary | ICD-10-CM | POA: Insufficient documentation

## 2019-03-17 DIAGNOSIS — Y939 Activity, unspecified: Secondary | ICD-10-CM | POA: Insufficient documentation

## 2019-03-17 DIAGNOSIS — Y929 Unspecified place or not applicable: Secondary | ICD-10-CM | POA: Insufficient documentation

## 2019-03-17 DIAGNOSIS — Y999 Unspecified external cause status: Secondary | ICD-10-CM | POA: Insufficient documentation

## 2019-03-17 DIAGNOSIS — I10 Essential (primary) hypertension: Secondary | ICD-10-CM | POA: Insufficient documentation

## 2019-03-17 MED ORDER — CYCLOBENZAPRINE HCL 10 MG PO TABS: 10.0000 mg | ORAL_TABLET | Freq: Every day | ORAL | 0 refills | Status: DC

## 2019-03-17 MED ORDER — HYDROCODONE-ACETAMINOPHEN 5-325 MG PO TABS: 1.0000 | ORAL_TABLET | Freq: Four times a day (QID) | ORAL | 0 refills | Status: DC | PRN

## 2019-03-17 MED ORDER — PREDNISONE 50 MG PO TABS: 50.0000 mg | ORAL_TABLET | Freq: Every day | ORAL | 0 refills | Status: DC

## 2019-03-17 NOTE — ED Provider Notes (Signed)
[place] DEPT Provider Note   CSN: [id number] Arrival date & time: 03/17/19  1447    History   Chief Complaint Chief Complaint  Patient presents with  . Back Pain  . Sore Throat    HPI Carla [name] is a 48 y.o. female.     HPI Patient presents to the emergency department with upper back pain by the left scapula.  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  The patient states that certain movements palpation make her pain worse.  She states she did not take any medications prior to arrival for her symptoms.  Patient states that she has no other injuries noted other than the overuse injury.  The patient denies chest pain, shortness of breath, headache,blurred vision, neck pain, fever, cough, weakness, numbness, dizziness, anorexia, edema, abdominal pain, nausea, vomiting, diarrhea, rash,dysuria, hematemesis, bloody stool, near syncope, or syncope. Past Medical History:  Diagnosis Date  . Depression   . Frequent headaches   . 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 Hyperlipidemia   . Hypertension   . Stroke [facility])    09/2015    Patient Active Problem List   Diagnosis Date Noted  . Left knee pain 01/08/2018  . 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 Premature ventricular contractions 03/25/2016  . Hyperlipidemia 03/25/2016  . Benign paroxysmal positional vertigo 03/11/2016  . Neck pain on right side 02/19/2016  . Cryptogenic stroke ([place]) 12/28/2015  . Multiple hemangiomas   . Cerebral infarction due to unspecified mechanism   . 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 Migraines 10/11/2015    Past Surgical History:  Procedure Laterality Date  . RIGHT ARM SURGERY    . TEE WITHOUT CARDIOVERSION N/A 10/13/2015   Procedure: TRANSESOPHAGEAL ECHOCARDIOGRAM (TEE);  Surgeon: Lelon Perla, MD;  Location: [facility] ENDOSCOPY;  Service: Cardiovascular;  Laterality: N/A;     OB History    Gravida  2   Para  2   Term  1   Preterm  1   AB  0   Living  2     SAB  0   TAB  0   Ectopic  0   Multiple  0   Live Births  2            Home Medications    Prior to Admission medications   Medication Sig Start Date End Date Taking? Authorizing Provider  amLODipine (NORVASC) 5 MG tablet Take 1 tablet (5 mg total) by mouth daily. 02/02/19   [name], PA  aspirin 81 MG tablet Take 1 tablet (81 mg total) by mouth daily. 12/30/18   Venancio Poisson, NP  atorvastatin (LIPITOR) 20 MG tablet Take 1 tablet (20 mg total) by mouth daily. 02/02/19   [name], PA  pravastatin (PRAVACHOL) 20 MG tablet Take 1 tablet (20 mg total) by mouth daily. 03/09/19   [name], PA    Family History Family History  Problem Relation Age of Onset  . Leukemia Mother   . Colon cancer Brother   . Asthma Neg Hx   . Cancer Neg Hx   . Diabetes Neg Hx   . Hyperlipidemia Neg Hx   . Heart failure Neg Hx   . Hypertension Neg Hx   . Migraines Neg Hx   . Rashes / Skin problems Neg Hx   . Seizures Neg Hx   . Stroke Neg Hx   . Thyroid disease Neg Hx     Social History Social  History   Tobacco Use  . Smoking status: Never Smoker  . Smokeless tobacco: Never Used  Substance Use Topics  . Alcohol use: No  . Drug use: No     Allergies   Lipitor [atorvastatin calcium] and Tramadol   Review of Systems Review of Systems All other systems negative except as documented in the HPI. All pertinent positives and negatives as reviewed in the HPI.  Physical Exam Updated Vital Signs BP (!) 137/97 (BP Location: Right Arm)   Pulse 94   Temp 98 F (36.7 C) (Oral)   Resp 16   Ht 5\' 4"  (1.626 m)   Wt 59 kg   LMP 02/18/2019 (LMP Unknown)   SpO2 100%   BMI 22.33 kg/m   Physical Exam Vitals signs and nursing note reviewed.  Constitutional:      General: She is not in acute distress.    Appearance: She is well-developed.  HENT:     Head: Normocephalic and atraumatic.  Eyes:     Pupils: Pupils are  equal, round, and reactive to light.  Cardiovascular:     Rate and Rhythm: Normal rate and regular rhythm.  Pulmonary:     Effort: Pulmonary effort is normal. No respiratory distress.     Breath sounds: No stridor. No wheezing, rhonchi or rales.  Chest:     Chest wall: No tenderness.  Musculoskeletal:       Back:  Skin:    General: Skin is warm and dry.  Neurological:     Mental Status: She is alert and oriented to person, place, and time.     Sensory: Sensation is intact. No sensory deficit.     Motor: No weakness.     Coordination: Coordination normal.      ED Treatments / Results  Labs (all labs ordered are listed, but only abnormal results are displayed) Labs Reviewed - No data to display  EKG None  Radiology Dg Thoracic Spine 2 View  Result Date: 03/17/2019 CLINICAL DATA:  Back pain for 3 weeks EXAM: THORACIC SPINE 2 VIEWS COMPARISON:  None. FINDINGS: There is no evidence of thoracic spine fracture. Alignment is normal. No other significant bone abnormalities are identified. IMPRESSION: Negative. Electronically Signed   By: Kathreen Devoid   On: 03/17/2019 17:11    Procedures Procedures (including critical care time)  Medications Ordered in ED Medications - No data to display   Initial Impression / Assessment and Plan / ED Course  I have reviewed the triage vital signs and the nursing notes.  Pertinent labs & imaging results that were available during my care of the patient were reviewed by me and considered in my medical decision making (see chart for details).        Denies no upper extremity weakness or numbness.  The patient will be treated for thoracic strain.  Have advised the patient to use ice and heat over the area.  I did advise this can take several weeks to get better.  Told to return for any worsening in her condition.  The patient agrees the plan and all questions were answered.  Final Clinical Impressions(s) / ED Diagnoses   Final diagnoses:   None    ED Discharge Orders    None       Rebeca Allegra 03/17/19 2325    Blanchie Dessert, MD 03/19/19 2127

## 2019-03-17 NOTE — Telephone Encounter (Signed)
See note

## 2019-03-17 NOTE — Discharge Instructions (Signed)
Your x-ray did not show any abnormality.  Use ice and heat over the area that is sore.  Return here for any worsening in your condition.  I feel that you most likely have strained the musculature in that region and this can take several weeks to heal.

## 2019-03-17 NOTE — Telephone Encounter (Signed)
Patient called and says for the past 3 weeks she's been having left lung pain and difficulty breathing at rest. She says when she's up moving around, the breathing is fine, but she still has he lung pain. She says she feels warm, but no fever. She says she has a dry cough and a headache. I called the office and spoke to [place], [facility] about the symptoms, she spoke to [place], [place] who says to have patient go to [facility] ED. I advised the patient, gave her the address, called and spoke to Dillon Bjork, RN at [facility] ED, he says to have her to come on over. I advised the patient to go ahead to the ED and no one is allowed in the hospital with her, she says she will drive herself.  Answer Assessment - Initial Assessment Questions 1. RESPIRATORY STATUS: "Describe your breathing?" (e.g., wheezing, shortness of breath, unable to speak, severe coughing)      Pain in the left lung when normal breathing and it's difficult 2. ONSET: "When did this breathing problem begin?"      3 weeks 3. PATTERN "Does the difficult breathing come and go, or has it been constant since it started?"      Come and go 4. SEVERITY: "How bad is your breathing?" (e.g., mild, moderate, severe)    - MILD: No SOB at rest, mild SOB with walking, speaks normally in sentences, can lay down, no retractions, pulse < 100.    - MODERATE: SOB at rest, SOB with minimal exertion and prefers to sit, cannot lie down flat, speaks in phrases, mild retractions, audible wheezing, pulse 100-120.    - SEVERE: Very SOB at rest, speaks in single words, struggling to breathe, sitting hunched forward, retractions, pulse > 120      SOB at rest, but not when up moving around 5. RECURRENT SYMPTOM: "Have you had difficulty breathing before?" If so, ask: "When was the last time?" and "What happened that time?"      No 6. CARDIAC HISTORY: "Do you have any history of heart disease?" (e.g., heart attack, angina, bypass surgery, angioplasty)      No 7. LUNG HISTORY: "Do you  have any history of lung disease?"  (e.g., pulmonary embolus, asthma, emphysema)     No 8. CAUSE: "What do you think is causing the breathing problem?"      I don't know 9. OTHER SYMPTOMS: "Do you have any other symptoms? (e.g., dizziness, runny nose, cough, chest pain, fever)     Cough, lung pain on the left, headache 10. PREGNANCY: "Is there any chance you are pregnant?" "When was your last menstrual period?"       No. LMP last week 11. TRAVEL: "Have you traveled out of the country in the last month?" (e.g., travel history, exposures)       No  Protocols used: BREATHING DIFFICULTY-A-AH

## 2019-03-17 NOTE — ED Triage Notes (Signed)
Pt states back pain x 3 weeks on upper left side of back. Pt also states throat discomfort x3 weeks. Pt also states she has had some headaches.

## 2019-03-17 NOTE — Telephone Encounter (Signed)
Noted  

## 2019-03-17 NOTE — ED Notes (Signed)
Pt ambulated to bathroom without assistance 

## 2019-03-23 ENCOUNTER — Ambulatory Visit: Payer: Medicaid Other | Admitting: Physician Assistant

## 2019-03-26 ENCOUNTER — Ambulatory Visit: Payer: Medicaid Other | Admitting: Physician Assistant

## 2019-03-29 ENCOUNTER — Ambulatory Visit: Payer: Medicaid Other | Admitting: Physician Assistant

## 2019-03-29 ENCOUNTER — Encounter: Payer: Self-pay | Admitting: Physician Assistant

## 2019-03-29 ENCOUNTER — Telehealth: Payer: Self-pay | Admitting: *Deleted

## 2019-03-29 ENCOUNTER — Ambulatory Visit (INDEPENDENT_AMBULATORY_CARE_PROVIDER_SITE_OTHER): Payer: Medicaid Other | Admitting: Physician Assistant

## 2019-03-29 VITALS — BP 120/78 | HR 92 | Temp 98.4°F | Ht 64.0 in | Wt 130.4 lb

## 2019-03-29 DIAGNOSIS — I10 Essential (primary) hypertension: Secondary | ICD-10-CM

## 2019-03-29 DIAGNOSIS — G43909 Migraine, unspecified, not intractable, without status migrainosus: Secondary | ICD-10-CM

## 2019-03-29 NOTE — Telephone Encounter (Signed)
Spoke to pt asked her if she can come in tomorrow for BP check. Pt said yes around 10:00 AM. Told her that is fine and Aldona Bar is going to hold off on medication till we see what your blood pressure is. Pt verbalized understanding.

## 2019-03-29 NOTE — Progress Notes (Signed)
TELEPHONE ENCOUNTER   Patient verbally agreed to telephone visit and is aware that copayment and coinsurance may apply. Patient was treated using telemedicine according to accepted telemedicine protocols.  Location of the patient: home Location of provider: [facility] home office Names of all persons participating in the telemedicine service and role in the encounter: [name], PA , Marjory Lies  Subjective:   Chief Complaint  Patient presents with  . Follow-up     HPI   HTN and HA Patient is c/o headaches every day. Has not checked her blood pressure in two weeks, but states that the last time she checked it was 120/70. Denies dizziness, blurred vision. Pt says she is taking Amlodipine 5 mg daily x 3 days (was taking 10 mg daily and felt worse when taking this high of a dosage.) She feels like her blood pressure is getting low when she wakes up in the morning. She has "low energy" at that time. Her machine doesn't always work correctly so she doesn't have accurate readings for me today.  In the afternoon, her head is hurting. She thinks that her blood pressure is causing this because it's too low. She is taking Tylenol q other day. She has history of migraines and has been prescribed Topamax in the past, but states that she has never actually used it.  Denies: dizziness, lightheadedness, n/v, numbness/tingling, weakness on one side of the body  Patient Active Problem List   Diagnosis Date Noted  . Left knee pain 01/08/2018  . 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 Premature ventricular contractions 03/25/2016  . Hyperlipidemia 03/25/2016  . Benign paroxysmal positional vertigo 03/11/2016  . Neck pain on right side 02/19/2016  . Cryptogenic stroke ([place]) 12/28/2015  . Multiple hemangiomas   . Cerebral infarction due to unspecified mechanism   . 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 Migraines 10/11/2015   Social History   Tobacco Use  . Smoking status: Never Smoker  .  Smokeless tobacco: Never Used  Substance Use Topics  . Alcohol use: No    Current Outpatient Medications:  .  amLODipine (NORVASC) 5 MG tablet, Take 1 tablet (5 mg total) by mouth daily., Disp: 90 tablet, Rfl: 0 .  aspirin 81 MG tablet, Take 1 tablet (81 mg total) by mouth daily., Disp: 30 tablet, Rfl: 1 .  pravastatin (PRAVACHOL) 20 MG tablet, Take 1 tablet (20 mg total) by mouth daily., Disp: 30 tablet, Rfl: 1 .  cyclobenzaprine (FLEXERIL) 10 MG tablet, Take 1 tablet (10 mg total) by mouth at bedtime. (Patient not taking: Reported on 03/29/2019), Disp: 10 tablet, Rfl: 0 .  HYDROcodone-acetaminophen (NORCO/VICODIN) 5-325 MG tablet, Take 1 tablet by mouth every 6 (six) hours as needed for moderate pain. (Patient not taking: Reported on 03/29/2019), Disp: 12 tablet, Rfl: 0 .  predniSONE (DELTASONE) 50 MG tablet, Take 1 tablet (50 mg total) by mouth daily. (Patient not taking: Reported on 03/29/2019), Disp: 5 tablet, Rfl: 0 Allergies  Allergen Reactions  . Lipitor [Atorvastatin Calcium] Swelling    throat  . Tramadol Nausea Only    Assessment & Plan:   1. Migraine without status migrainosus, not intractable, unspecified migraine type  No red flags on discussion with patient. She has topamax on hand if needed. Historically, she has had issues with tolerating several medications and even minor dosage changes. Will have patient come in for BP check and assess BP. If normal, will have her consider starting 25 mg Topamax daily. If still uncontrolled, will refer  back to neurology for further guidance.   2. Essential hypertension  She is going to come by for a BP check to assess.her BP. Further recommendations based on results.    No orders of the defined types were placed in this encounter.  No orders of the defined types were placed in this encounter.   [name], [place] 03/29/2019  Time spent with the patient: 12 minutes, spent in obtaining information about her symptoms, reviewing her  previous labs, evaluations, and treatments, counseling her about her condition (please see the discussed topics above), and developing a plan to further investigate it; she had a number of questions which I addressed.

## 2019-03-29 NOTE — Telephone Encounter (Signed)
Left message on voicemail to call office. Needs to come in tomorrow morning for blood pressure check and schedule follow up in one week virtual visit for headache.

## 2019-03-30 ENCOUNTER — Telehealth: Payer: Self-pay | Admitting: *Deleted

## 2019-03-30 ENCOUNTER — Encounter: Payer: Self-pay | Admitting: Physician Assistant

## 2019-03-30 NOTE — Telephone Encounter (Signed)
Pt presented to the office today for blood pressure check. Her Bp was 120/78, pulse 92. Pt brought her home blood pressure device and I checked her pressure with it and it was close to what I got with ours so it was working. Pt verbalized understanding. Pt was given a blood pressure log to keep track of her blood pressure for follow up appointment next Monday. Pt verbalized understanding.

## 2019-03-31 NOTE — Telephone Encounter (Signed)
Agree with the assessment and plan.  [name] PA-C

## 2019-04-05 ENCOUNTER — Telehealth: Payer: Self-pay | Admitting: Neurology

## 2019-04-05 ENCOUNTER — Ambulatory Visit (INDEPENDENT_AMBULATORY_CARE_PROVIDER_SITE_OTHER): Payer: Medicaid Other | Admitting: Physician Assistant

## 2019-04-05 ENCOUNTER — Encounter: Payer: Self-pay | Admitting: Physician Assistant

## 2019-04-05 DIAGNOSIS — G43909 Migraine, unspecified, not intractable, without status migrainosus: Secondary | ICD-10-CM

## 2019-04-05 NOTE — Telephone Encounter (Signed)
Patient would like a call back regarding medication change - headaches are worse. Best call back [phone].

## 2019-04-05 NOTE — Telephone Encounter (Signed)
Janett Billow NP recommend pt be seen in June for a office visit for headaches.

## 2019-04-05 NOTE — Progress Notes (Signed)
   TELEPHONE ENCOUNTER   Patient verbally agreed to telephone visit and is aware that copayment and coinsurance may apply. Patient was treated using telemedicine according to accepted telemedicine protocols.  Location of the patient: home Location of provider: [place] Office Names of all persons participating in the telemedicine service and role in the encounter: [name], [place]  Laure Kidney  Subjective:   Chief Complaint  Patient presents with  . Migraine     HPI   Pt c/o still having headache, worse then before. Pt was started on Topamax 25 mg for headaches. Pt said she took one on Friday morning and her headache went away for little while. She took another topamax on Saturday around 1pm but feels like it made her "almost pass out and nauseous."  She is having HA daily at this point.  Denies: fever, changes in vision, weakness on one side of the body, slurred speech  Blood pressure 129/73, pulse 93. She is taking her Norvasc 5 mg daily per her report.  Patient Active Problem List   Diagnosis Date Noted  . Left knee pain 01/08/2018  . 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 Premature ventricular contractions 03/25/2016  . Hyperlipidemia 03/25/2016  . Benign paroxysmal positional vertigo 03/11/2016  . Neck pain on right side 02/19/2016  . Cryptogenic stroke ([place]) 12/28/2015  . Multiple hemangiomas   . Cerebral infarction due to unspecified mechanism   . 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 Migraines 10/11/2015   Social History   Tobacco Use  . Smoking status: Never Smoker  . Smokeless tobacco: Never Used  Substance Use Topics  . Alcohol use: No    Current Outpatient Medications:  .  amLODipine (NORVASC) 5 MG tablet, Take 1 tablet (5 mg total) by mouth daily., Disp: 90 tablet, Rfl: 0 .  aspirin 81 MG tablet, Take 1 tablet (81 mg total) by mouth daily., Disp: 30 tablet, Rfl: 1 .  pravastatin (PRAVACHOL) 20 MG tablet, Take 1 tablet (20 mg total) by  mouth daily., Disp: 30 tablet, Rfl: 1 .  topiramate (TOPAMAX) 25 MG tablet, Take 25 mg by mouth daily., Disp: , Rfl:  Allergies  Allergen Reactions  . Lipitor [Atorvastatin Calcium] Swelling    throat  . Tramadol Nausea Only    Assessment & Plan:   1. Migraine without status migrainosus, not intractable, unspecified migraine type   She is not comfortable continuing the topamax. I have asked her to take at night to see if this helps with side effects but she is very reluctant. She also states that she has tried to make an appointment with neurology but she hasn't been able to talk to anyone. I will reach out to her neurologist to see if she can be soon, given hx CVA and ongoing, uncontrolled HA. Red flags advised.  No orders of the defined types were placed in this encounter.  No orders of the defined types were placed in this encounter.   [name], [place] 04/05/2019  Time spent with the patient: 8 minutes, spent in obtaining information about her symptoms, reviewing her previous labs, evaluations, and treatments, counseling her about her condition (please see the discussed topics above), and developing a plan to further investigate it; she had a number of questions which I addressed.

## 2019-04-05 NOTE — Progress Notes (Signed)
I will be more than happy to have her scheduled for an office visit vs telemedicine visit for further evaluation.  Our office will reach out to her to have her scheduled.  Thank you for this update.

## 2019-04-06 NOTE — Telephone Encounter (Signed)
I called pt via interpreter to schedule a office visit for headaches per Janett Billow NP. Pt knows appt for June will in office and the appt in August 2020 is cancel because we are seeing her early. Pt will take recommendations on topamax from PCP until visit with Janett Billow NP.

## 2019-04-27 ENCOUNTER — Telehealth: Payer: Self-pay

## 2019-04-27 NOTE — Telephone Encounter (Signed)
Left message for pt via language line services that appt will be video due to COVID 19. I left message if she has someone at her house that can interpreter for her we can text link to her phone. Left number for pt to cal back.

## 2019-04-28 NOTE — Telephone Encounter (Signed)
I called interpreter services at [phone].to see if pt can do video visit with family. I was speaking with pt via [place] interpreter. I stated we prefer video due to COVID 19. Pt stated her son and husband will be at work and will not be available.to interpreted  for her. I stated it will remain as a in office visit and to check in at 115pm at curbside. I also stated the nurse will call her when we are ready for appt. Pt verbalized understanding the check in process and will wear gloves and mask.

## 2019-04-29 ENCOUNTER — Ambulatory Visit: Payer: Self-pay | Admitting: Adult Health

## 2019-05-19 ENCOUNTER — Telehealth: Payer: Self-pay

## 2019-05-19 NOTE — Telephone Encounter (Signed)
Patient walked into clinic complaining of symptoms related to elevated blood pressure.  I triaged patient.  She was complaining of a feeling of her body shaking every morning, occasional dizziness, feeling off balance in the morning, a feeling of swelling in her throat (no pain).  She also stated she left work early today because she had a nosebleed.  BP:  142/82 HR:  94 Temp:  98.3 O2:  97%  Patient takes amplodipine 5 mg daily and states these symptoms began 4 months ago when she began taking the amlodipine.  She feels the medication is at fault.  After discussion with Dr. Yong Channel, it was determined that patient should be evaluated by a provider.  Patient offered afternoon appointment with Dr. Elease Hashimoto at [place], but ultimately turned down this appointment.  She will see Dr. Jerline Pain on Friday @ 11:20 am.  She was instructed to proceed to the ED if her symptoms worsen.  Also advised patient to monitor her BP over the next couple of days prior to her appointment.  Patient verbalized understanding.

## 2019-05-21 ENCOUNTER — Ambulatory Visit (INDEPENDENT_AMBULATORY_CARE_PROVIDER_SITE_OTHER): Payer: Medicaid Other | Admitting: Family Medicine

## 2019-05-21 ENCOUNTER — Other Ambulatory Visit: Payer: Self-pay

## 2019-05-21 ENCOUNTER — Encounter: Payer: Self-pay | Admitting: Family Medicine

## 2019-05-21 VITALS — BP 131/85 | HR 86 | Temp 98.3°F | Ht 64.0 in | Wt 128.0 lb

## 2019-05-21 DIAGNOSIS — R202 Paresthesia of skin: Secondary | ICD-10-CM

## 2019-05-21 DIAGNOSIS — R5383 Other fatigue: Secondary | ICD-10-CM

## 2019-05-21 DIAGNOSIS — R2689 Other abnormalities of gait and mobility: Secondary | ICD-10-CM

## 2019-05-21 DIAGNOSIS — R6889 Other general symptoms and signs: Secondary | ICD-10-CM

## 2019-05-21 DIAGNOSIS — R251 Tremor, unspecified: Secondary | ICD-10-CM

## 2019-05-21 DIAGNOSIS — H538 Other visual disturbances: Secondary | ICD-10-CM

## 2019-05-21 NOTE — Progress Notes (Signed)
   Chief Complaint:  Carla [name] is a 48 y.o. female who presents today with a chief complaint of multiple neurologic symptoms.  History is provided by the patient via [place] interpreter..   Assessment/Plan:  Multiple Neurologic Symptoms Patient with episodes of several vague nonspecific symptoms including blurred vision, forgetfulness, dizziness, tremors, paresthesias, imbalance, and fatigue.  She does not currently have any symptoms and her neurologic exam is normal.  No clear etiology for her symptoms however could possibly represent complex migraines.  We will check blood work today including CBC, C met, A1c, TSH, and vitamin B12.  If no significant findings, will need to follow-up with neurology.  Patient voiced understanding.  Discussed reasons to seek emergent care.  Essential Hypertension At goal off medications.  Recommended patient stay off amlodipine given her good control today and the fact that she thought the above symptoms could be due to her blood pressure meds.    Subjective:  HPI:  Patient symptoms started a few weeks ago however does not have any current symptoms.  Symptoms include intermittent bouts of blurred vision, forgetfulness, dizziness, tremors, imbalance, and fatigue.  These episodes last for about 40 minutes and then spontaneously resolved.  She also has some tingling in her lower extremities.  She notices that when she eats chocolate her symptoms seem to improve.  Symptoms also seem to be worsened when sitting down or laying down.  She initially thought her symptoms were due to the amlodipine and she stopped that a few days ago.  She thinks that her symptoms have improved slightly since then.  Medical history significant for past history of migraines and 2 prior strokes.  Has not had any recent headaches.  Does not currently have any sort of weakness or numbness.  No fevers or chills.  No nausea or vomiting.  No other treatments tried.   ROS: Per HPI  PMH: She  reports that she has never smoked. She has never used smokeless tobacco. She reports that she does not drink alcohol or use drugs.      Objective:  Physical Exam: BP 131/85 (BP Location: Left Arm, Patient Position: Sitting, Cuff Size: Normal)   Pulse 86   Temp 98.3 F (36.8 C) (Oral)   Ht _0  (1.626 m)   Wt 128 lb (58.1 kg)   SpO2 99%   BMI 21.97 kg/m   Orthostatic VS for the past 24 hrs:  BP- Lying BP- Sitting BP- Standing at 0 minutes  05/21/19 1533 134/80 131/85 153/88  Gen: NAD, resting comfortably CV: Regular rate and rhythm with no murmurs appreciated Pulm: Normal work of breathing, clear to auscultation bilaterally with no crackles, wheezes, or rhonchi Neuro: Cranial nerves III through XII intact.  Finger-nose-finger testing intact bilaterally.  Visual acuity grossly intact.  Strength 5 out of 5 in upper and lower extremities.  Sensation to light touch grossly intact and symmetric bilaterally.  Reflexes 2+ and symmetric in upper and lower extremities.     Algis Greenhouse. Jerline Pain, MD 05/21/2019 3:33 PM

## 2019-05-21 NOTE — Patient Instructions (Signed)
It was very nice to see you today!  We will check blood work today.   You may be having complex migraines. Please follow up with your neurologist soon.   Take care, Dr Jerline Pain

## 2019-05-22 LAB — CBC
HCT: 39 % (ref 35.0–45.0)
Hemoglobin: 12.8 g/dL (ref 11.7–15.5)
MCH: 28.4 pg (ref 27.0–33.0)
MCHC: 32.8 g/dL (ref 32.0–36.0)
MCV: 86.7 fL (ref 80.0–100.0)
MPV: 10.7 fL (ref 7.5–12.5)
Platelets: 359 10*3/uL (ref 140–400)
RBC: 4.5 10*6/uL (ref 3.80–5.10)
RDW: 14 % (ref 11.0–15.0)
WBC: 10.7 10*3/uL (ref 3.8–10.8)

## 2019-05-22 LAB — COMPREHENSIVE METABOLIC PANEL
AG Ratio: 1.5 (calc) (ref 1.0–2.5)
ALT: 16 U/L (ref 6–29)
AST: 19 U/L (ref 10–35)
Albumin: 3.9 g/dL (ref 3.6–5.1)
Alkaline phosphatase (APISO): 66 U/L (ref 31–125)
BUN: 13 mg/dL (ref 7–25)
CO2: 25 mmol/L (ref 20–32)
Calcium: 9.4 mg/dL (ref 8.6–10.2)
Chloride: 103 mmol/L (ref 98–110)
Creat: 0.89 mg/dL (ref 0.50–1.10)
Globulin: 2.6 g/dL (calc) (ref 1.9–3.7)
Glucose, Bld: 92 mg/dL (ref 65–99)
Potassium: 4 mmol/L (ref 3.5–5.3)
Sodium: 138 mmol/L (ref 135–146)
Total Bilirubin: 0.7 mg/dL (ref 0.2–1.2)
Total Protein: 6.5 g/dL (ref 6.1–8.1)

## 2019-05-22 LAB — HEMOGLOBIN A1C
Hgb A1c MFr Bld: 5.2 % of total Hgb (ref <5.7)
Mean Plasma Glucose: 103 (calc)
eAG (mmol/L): 5.7 (calc)

## 2019-05-22 LAB — VITAMIN B12: Vitamin B-12: 999 pg/mL (ref 200–1100)

## 2019-05-22 LAB — TSH: TSH: 1.53 mIU/L

## 2019-05-24 ENCOUNTER — Other Ambulatory Visit: Payer: Self-pay | Admitting: *Deleted

## 2019-05-24 MED ORDER — PRAVASTATIN SODIUM 20 MG PO TABS: 20.0000 mg | ORAL_TABLET | Freq: Every day | ORAL | 2 refills | Status: DC

## 2019-05-24 NOTE — Progress Notes (Signed)
Please inform patient of the following:  Good news! Labs are all NORMAL. Recommend that she follow up with her neurologist regarding the symptoms she is having.  Algis Greenhouse. Jerline Pain, MD 05/24/2019 8:03 AM

## 2019-06-08 ENCOUNTER — Ambulatory Visit (INDEPENDENT_AMBULATORY_CARE_PROVIDER_SITE_OTHER): Payer: Medicaid Other | Admitting: Physician Assistant

## 2019-06-08 ENCOUNTER — Other Ambulatory Visit: Payer: Self-pay

## 2019-06-08 ENCOUNTER — Encounter: Payer: Self-pay | Admitting: Physician Assistant

## 2019-06-08 VITALS — BP 120/82 | HR 98 | Temp 98.4°F | Ht 64.0 in | Wt 131.2 lb

## 2019-06-08 DIAGNOSIS — E785 Hyperlipidemia, unspecified: Secondary | ICD-10-CM

## 2019-06-08 DIAGNOSIS — I10 Essential (primary) hypertension: Secondary | ICD-10-CM

## 2019-06-08 LAB — LIPID PANEL
Cholesterol: 149 mg/dL (ref 0–200)
HDL: 50.8 mg/dL (ref >39.00)
LDL Cholesterol: 74 mg/dL (ref 0–99)
NonHDL: 98.39
Total CHOL/HDL Ratio: 3
Triglycerides: 124 mg/dL (ref 0.0–149.0)
VLDL: 24.8 mg/dL (ref 0.0–40.0)

## 2019-06-08 NOTE — Progress Notes (Signed)
Virtual Visit via Video   I connected with Carla [name] on 06/08/19 at  1:20 PM EDT by a video enabled telemedicine application and verified that I am speaking with the correct person using two identifiers. Location patient: Home Location provider:  [facility], Office Persons participating in the virtual visit: Carla [name], Dor PA-C  I discussed the limitations of evaluation and management by telemedicine and the availability of in person appointments. The patient expressed understanding and agreed to proceed.  Subjective:   HPI:   HTN -- Currently not on any blood pressure medications. At home blood pressure readings are: 120-130/70-80. Patient denies chest pain, SOB, blurred vision, dizziness, unusual headaches, lower leg swelling. Denies excessive caffeine intake, stimulant usage, excessive alcohol intake, or increase in salt consumption.  Stroke -- history of this. She is asking if she needs to continue to take statins. Denies any concerns at this time.  ROS: See pertinent positives and negatives per HPI.  Patient Active Problem List   Diagnosis Date Noted  . Left knee pain 01/08/2018  . 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 Premature ventricular contractions 03/25/2016  . Hyperlipidemia 03/25/2016  . Benign paroxysmal positional vertigo 03/11/2016  . Neck pain on right side 02/19/2016  . Cryptogenic stroke ([place]) 12/28/2015  . Multiple hemangiomas   . Cerebral infarction due to unspecified mechanism   . 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 Migraines 10/11/2015    Social History   Tobacco Use  . Smoking status: Never Smoker  . Smokeless tobacco: Never Used  Substance Use Topics  . Alcohol use: No    Current Outpatient Medications:  .  aspirin 81 MG tablet, Take 1 tablet (81 mg total) by mouth daily., Disp: 30 tablet, Rfl: 1 .  pravastatin (PRAVACHOL) 20 MG tablet, Take 1 tablet (20 mg total) by mouth daily., Disp: 30 tablet, Rfl: 2 .  amLODipine  (NORVASC) 5 MG tablet, Take 1 tablet (5 mg total) by mouth daily. (Patient not taking: Reported on 05/21/2019), Disp: 90 tablet, Rfl: 0 .  topiramate (TOPAMAX) 25 MG tablet, Take 25 mg by mouth daily., Disp: , Rfl:   Allergies  Allergen Reactions  . Lipitor [Atorvastatin Calcium] Swelling    throat  . Tramadol Nausea Only    Objective:   VITALS: Per patient if applicable, see vitals. GENERAL: Alert, appears well and in no acute distress. HEENT: Atraumatic, conjunctiva clear, no obvious abnormalities on inspection of external nose and ears. NECK: Normal movements of the head and neck. CARDIOPULMONARY: No increased WOB. Speaking in clear sentences. I:E ratio WNL.  MS: Moves all visible extremities without noticeable abnormality. PSYCH: Pleasant and cooperative, well-groomed. Speech normal rate and rhythm. Affect is appropriate. Insight and judgement are appropriate. Attention is focused, linear, and appropriate.  NEURO: CN grossly intact. Oriented as arrived to appointment on time with no prompting. Moves both UE equally.  SKIN: No obvious lesions, wounds, erythema, or cyanosis noted on face or hands.  Assessment and Plan:   Carla [name] was seen today for medication dose change.  Diagnoses and all orders for this visit:  Essential hypertension Well controlled without medication. Continue to monitor. Worsening precautions advised.  Hyperlipidemia, unspecified hyperlipidemia type Re-check lipid panel today. Consider dose adjustment of pravastatin if needed. -     Lipid panel  . Reviewed expectations re: course of current medical issues. . Discussed self-management of symptoms. . Outlined signs and symptoms indicating need for more acute intervention. . Patient verbalized understanding and all questions were answered. Marland Kitchen  Health Maintenance issues including appropriate healthy diet, exercise, and smoking avoidance were discussed with patient. . See orders for this visit as documented in the  electronic medical record.  I discussed the assessment and treatment plan with the patient. The patient was provided an opportunity to ask questions and all were answered. The patient agreed with the plan and demonstrated an understanding of the instructions.   The patient was advised to call back or seek an in-person evaluation if the symptoms worsen or if the condition fails to improve as anticipated.   [place], [place] 06/08/2019

## 2019-06-08 NOTE — Patient Instructions (Signed)
It was great to see you!  STOP NORVASC.  We will call you with your blood work results to discuss your cholesterol medication. Continue the pravastatin at this time.      Take care,  [name] PA-C

## 2019-06-09 ENCOUNTER — Other Ambulatory Visit: Payer: Self-pay | Admitting: Physician Assistant

## 2019-06-09 ENCOUNTER — Telehealth: Payer: Self-pay

## 2019-06-09 NOTE — Telephone Encounter (Signed)
Pt was a no show for appt on 04/29/2019. See phone note from April 28, 2019.

## 2019-06-16 ENCOUNTER — Ambulatory Visit: Payer: Medicaid Other | Admitting: Emergency Medicine

## 2019-06-17 ENCOUNTER — Encounter: Payer: Self-pay | Admitting: Emergency Medicine

## 2019-06-21 ENCOUNTER — Ambulatory Visit (INDEPENDENT_AMBULATORY_CARE_PROVIDER_SITE_OTHER): Payer: Self-pay | Admitting: Neurology

## 2019-06-21 ENCOUNTER — Other Ambulatory Visit: Payer: Self-pay

## 2019-06-21 ENCOUNTER — Encounter: Payer: Self-pay | Admitting: Neurology

## 2019-06-21 VITALS — BP 142/83 | HR 70 | Temp 98.4°F | Wt 130.0 lb

## 2019-06-21 DIAGNOSIS — G43009 Migraine without aura, not intractable, without status migrainosus: Secondary | ICD-10-CM

## 2019-06-21 MED ORDER — ELETRIPTAN HYDROBROMIDE 20 MG PO TABS: 20.0000 mg | ORAL_TABLET | ORAL | 0 refills | Status: DC | PRN

## 2019-06-21 MED ORDER — TOPIRAMATE 25 MG PO TABS: 25.0000 mg | ORAL_TABLET | Freq: Two times a day (BID) | ORAL | 3 refills | Status: DC

## 2019-06-21 NOTE — Progress Notes (Signed)
[name] [address] [place]. [place] [address] [phone]       OFFICE CONSULT NOTE  [name] Date of Birth:  [birth year] Medical Record Number:  [id number]   Referring MD:  Jarold Motto, PA-c Reason for Referral:  headache  Chief Complaint  Patient presents with  . Follow-up    MIgraine follow up room 1 pt with interprreter Lanora Manis and her temp  is 98.2    HPI: 06/21/19  [name] is being seen today for follow-up visit for migraines and is accompanied by interpreter as patient is primarily Spanish-speaking.  Upon initial visit with Dr. Pearlean Brownie on 09/29/2018 (visit info below), headaches were not frequent enough to justify initiating migraine prophylaxis.  She did call back on 12/02/2018 reporting daily migraines and therefore initiated Topamax 25 mg twice daily.  She took this medication for 4 days but discontinued as medication was making her too fatigued.  She consider taking 1 tab at night only but wanted to speak with [place] in regards to this first.  She was evaluated in the ED on 12/19/2018 and returned on 12/23/2018 due to vaginal bleeding.  Apparently, all medications discontinued after 12/23/2018 visit and was started on Provera for 10-day course with recommending follow-up with OB/GYN for further evaluation.  She reports today that her headaches have improved and not experience a headache since ED admission on 12/23/2018.  She does have an appointment scheduled at [facility] on 01/12/2019 for further evaluation.  She denies any continued vaginal bleeding at this time.  Blood pressure today satisfactory 130/68.  It was recommended after prior visit to undergo transcranial Doppler with bubble study to assess for possible PFO.  This was obtained on 10/14/2018 and was negative for PFO.    HISTORY SUMMARY: Initial visit 09/29/2018 Dr. Pearlean Brownie:  [name] is a pleasant 48 year Hispanic lady originally from [place] who was seen today for  evaluation for headaches.  She is accompanied by her friend as well as Spanish language interpreter who provides translation for this visit.  History is obtained from them, review of electronic medical records and have personally reviewed imaging films.  Patient has been complaining of increasing headaches for the last 2 months.  She describes this headache as being right temporal and frontal mostly throbbing in nature though fluctuating in severity from 6/10 to 10/10.  Headache is accompanied by nausea vomiting light and sound sensitivity.  She has to stop her activities and lie down and sleeping as well as taking Tylenol seems to help.  Headaches are accompanied by a single vision spots and scintillations and blurred vision.  She denies any speech difficulties, vertigo, diplopia, gait or balance difficulties with headaches.  Headaches usually last about 30 to 40 minutes.  Headaches of late have been occurring about 2-3 times per week.  She does give a remote history of infrequent headaches which sound like migraine headaches and was similar but were mostly perimenstrual and not bad.  The patient also states in September 2019 she had a episode of speech difficulties as well as dizziness.  She was seen by primary care physician who states that the patient had a headache as well as feeling of dizziness and some speech difficulties.  This happened following a stressful event.  An outpatient MRI was ordered which was done on 08/05/2018 which I personally reviewed shows a few nonspecific white matter hyperintensities but no acute abnormality or definite stroke.  Patient states her symptoms improved within a few  days but the headaches have persisted since then.  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 MRA of the brain showed slight  diminished peripheral branches in the left MCA but no large vessel stenosis or occlusion. Carotid ultrasound showed no significant extracranial stenosis. Transthoracic echo showed normal ejection fraction. Transesophageal echocardiogram showed no cardiac source of embolism or PFO. Antiphospholipid antibodies and lupus anticoagulant were both negative. Lipid profile showed elevated total cholesterol 207 and LDL 139 mg percent. Hemoglobin A1c was 5.6. Patient was started on aspirin as well as protocol. She states her speech difficulty and right-sided weakness completely improved.  She was placed on aspirin and pravastatin which she actually did well on but for unclear reason she discontinued both medications.  She is currently on Lipitor 40 which was started a few months ago by primary physician but she is complaining of dizziness on this medication and wants to change it or stop it.  She states she had slight balance difficulties from her previous stroke but had recovered well from that.  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  She states her blood pressure is under good control.  Last lipid profile checked in August 2019 showed LDL of 120 following which Lipitor was added.  I had recommended doing a transcranial Doppler bubble study at last visit with me in 2017 but for some unclear reason this was never done.Marland Kitchen Update 06/21/2019 : She returns for follow-up after last visit 49 months ago.  She is accompanied by a [place] language interpreter who was present throughout the visit and interprets for her.  Patient states her main complaint today is that her migraine headaches seem to have come back.  In the last 3 months she has had increased frequency of headaches which occur 3 to 4 days/week.  The headache starts off as a throbbing pain around her right temple as well as periorbital region becomes unbearable 10/10 in severity.  She has relied on.  Light and sound do worsen the  headache.  She does not throw up or feel nauseous.  She does not have visual symptoms with the headaches.  Headache may last for most of the day.  She is unable to identify specific triggers for headaches but does feel tightness in the back of her head and neck muscles.  She has not tried triptans or prescription migraine specific medications she has tried taking Tylenol and ibuprofen without relief.  She has been taking naproxen which seems to work better.  She has been taking them 3 to 4 days a week.  She was previously on Topamax but when she started having dysfunctional uterine bleeding all the medications were stopped including Topamax.  She is unable to tell me if she had any bad side effects on it.  Patient recently changed her primary care physician and a new physician assistant discontinued protocol for unclear reason.  She remains on aspirin which is tolerating well without bruising or bleeding.  Her blood pressure is under good control and today it is 142/83.  She has had no recurrent stroke or TIA symptoms. ROS:   14 system review of systems is positive for headaches, photophobia, no complaints and all other systems negative  PMH:  Past Medical History:  Diagnosis Date  . Depression   .  Frequent headaches   . 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 Hyperlipidemia   . Hypertension   . Stroke [facility])    09/2015    Social History:  Social History   Socioeconomic History  . Marital status: Married    Spouse name: Not on file  . Number of children: Not on file  . Years of education: Not on file  . Highest education level: Not on file  Occupational History  . Occupation: Financial trader  Social Needs  . Financial resource strain: Not on file  . Food insecurity    Worry: Not on file    Inability: Not on file  . Transportation needs    Medical: Not on file    Non-medical: Not on file  Tobacco Use  . Smoking status: Never Smoker  . Smokeless tobacco: Never Used  Substance and Sexual Activity   . Alcohol use: No  . Drug use: No  . Sexual activity: Not Currently    Birth control/protection: None  Lifestyle  . Physical activity    Days per week: Not on file    Minutes per session: Not on file  . Stress: Not on file  Relationships  . Social Musician on phone: Not on file    Gets together: Not on file    Attends religious service: Not on file    Active member of club or organization: Not on file    Attends meetings of clubs or organizations: Not on file    Relationship status: Not on file  . Intimate partner violence    Fear of current or ex partner: Not on file    Emotionally abused: Not on file    Physically abused: Not on file    Forced sexual activity: Not on file  Other Topics Concern  . Not on file  Social History Narrative   From [place]   Lived in [place] for 20 years, and been in [place] for 6 years   Husband and son, 87   Works: clean homes    Medications:   Current Outpatient Medications on File Prior to Visit  Medication Sig Dispense Refill  . aspirin 81 MG tablet Take 1 tablet (81 mg total) by mouth daily. 30 tablet 1  . naproxen (NAPROSYN) 500 MG tablet Take 500 mg by mouth as needed.     No current facility-administered medications on file prior to visit.     Allergies:   Allergies  Allergen Reactions  . Lipitor [Atorvastatin Calcium] Swelling    throat  . Tramadol Nausea Only    Physical Exam General: well developed, well nourished pleasant middle-aged Hispanic lady, seated, in no evident distress Head: head normocephalic and atraumatic.   Neck: supple with no carotid or supraclavicular bruits Cardiovascular: regular rate and rhythm, no murmurs Musculoskeletal: no deformity Skin:  no rash/petichiae Vascular:  Normal pulses all extremities  Neurologic Exam Mental Status: awake and fully alert. Oriented to place and time. Recent and remote memory intact. Attention span, concentration and fund of knowledge appropriate. Mood and  affect appropriate.  Cranial Nerves: Pupils equal, briskly reactive to light.Fundi show sharp disc margins. Extraocular movements full without nystagmus. Visual fields full to confrontation. Hearing intact. Facial sensation intact. Face, tongue, palate moves normally and symmetrically.  Motor: Normal bulk and tone. Normal strength in all tested extremity muscles. Sensory.: intact to touch , pinprick , position and vibratory sensation.  Coordination: Rapid alternating movements normal in all extremities. Finger-to-nose and heel-to-shin performed accurately  bilaterally. Gait and Station: Arises from chair without difficulty. Stance is normal. Gait demonstrates normal stride length and balance . Able to heel, toe and tandem walk without difficulty.  Reflexes: 1+ and symmetric. Toes downgoing.      ASSESSMENT:  Carla [name] is a 48 year old Hispanic lady with with PMH of cryptogenic stroke 2016, TIA, HDL and HTN and worsening  migraines.      PLAN:  I had a long discussion with the patient via Spanish language interpreter about her migraine headaches which appear to have increased in frequency over the last 3 months since stopping Topamax.  Recommend she start taking Topamax 25 twice daily with meals for migraine prevention as well as switch to Relpax 20 mg as needed for symptomatic relief and limit taking Naprosyn because of risk of medication overuse headaches and stomach upset.  I also advised her to do regular neck stretching exercises and avoid headache triggers.  She will stay on aspirin 81 mg daily for stroke prevention and I recommend we check lipid profile and hemoglobin A1c.  She was on Pravachol which for unclear reason has been discontinued if lipid profile does show LDL is elevated she may need to restart it.  She will return for follow-up in 3 months with Shanda Bumps my nurse practitioner call earlier if necessary.needed Greater than 50% time during this 25-minute visit was spent on counseling  and coordination of care about her migraine headaches, remote stroke and TIA, recent uterine bleeding, and answering questions with assistance of interpreter  [name], MD  [facility] [address] [place], [place] [address]  Phone [phone] Fax [phone] Note: This document was prepared with digital dictation and possible smart phrase technology. Any transcriptional errors that result from this process are unintentional.

## 2019-06-21 NOTE — Patient Instructions (Signed)
I had a long discussion with the patient via Spanish language interpreter about her migraine headaches which appear to have increased in frequency over the last 3 months since stopping Topamax.  Recommend she start taking Topamax 25 twice daily with meals for migraine prevention as well as switch to Relpax 20 mg as needed for symptomatic relief and limit taking Naprosyn because of risk of medication overuse headaches and stomach upset.  I also advised her to do regular neck stretching exercises and avoid headache triggers.  She will stay on aspirin 81 mg daily for stroke prevention and I recommend we check lipid profile and hemoglobin A1c.  She was on Pravachol which for unclear reason has been discontinued if lipid profile does show LDL is elevated she may need to restart it.  She will return for follow-up in 3 months with Janett Billow my nurse practitioner call earlier if necessary.  Ejercicios para el cuello Neck Exercises Pregunte al mdico qu ejercicios son seguros para usted. Haga los ejercicios exactamente como se lo haya indicado el mdico y gradelos como se lo hayan indicado. Es normal sentir un estiramiento leve, tironeo, opresin o Tree surgeon al [employer] [place]. Detngase de inmediato si siente un dolor repentino o Printmaker. No comience a hacer estos ejercicios hasta que se lo indique el mdico. Los ejercicios para el cuello pueden ser importantes por muchos motivos. Pueden mejorar la fuerza y Theatre manager la flexibilidad del cuello, lo que ser til para la parte superior de la espalda y para Environmental education officer de cuello. Ejercicios de estiramiento Estiramiento del cuello con rotacin  1. Sintese en una silla o prese. 2. Apoye los pies en el piso, con una separacin del ancho de los hombros. 3. Gire lentamente la cabeza (rtela) hacia la derecha hasta sentir un ligero estiramiento. Grela completamente hacia la derecha de modo que pueda ver por encima de su hombro derecho. No incline ni  ladee la cabeza. 4. Mantenga esta posicin durante 10 a 30segundos. 5. Gire lentamente la cabeza (rtela) hacia la izquierda hasta sentir un ligero estiramiento. Grela completamente hacia la izquierda de modo que pueda ver por encima de su hombro izquierdo. No incline ni ladee la cabeza. 6. Mantenga esta posicin durante 10 a 30segundos. Repita __________ veces. Realice este ejercicio __________ veces al da. Retraccin del cuello 1. Sintese en una silla resistente o prese. 2. Mire hacia adelante. No doble el cuello. 3. Use los dedos para empujar la barbilla hacia atrs (retraccin). No doble el cuello al [employer]. Siga mirando hacia adelante. Si hace el ejercicio correctamente, tendr Ardelia Mems leve sensacin en la garganta y sentir que la nuca se estira. 4. Mantenga la elongacin durante 1o 2segundos. Repita __________ veces. Realice este ejercicio __________ veces al da. Ejercicios de fortalecimiento Presin con el cuello 1. Recustese sobre su espalda en una cama firme o en el suelo, y coloque una almohada debajo de la cabeza. 2. Use los msculos del cuello para presionar la cabeza contra la almohada y enderezar la columna vertebral. 3. Mantenga la posicin lo mejor que pueda. Mantenga la Alejandro Mulling arriba (en posicin neutral) y el mentn hacia abajo. 4. Cuente lentamente hasta 26mientras mantiene esta posicin. Repita __________ veces. Realice este ejercicio __________ veces al da. Isometra Estos son ejercicios en los que se fortalecen los msculos del cuello mientras se mantiene el cuello quieto (isometra). 1. Sintese en una silla con buen apoyo y coloque una mano sobre la frente. [place] y [place]  mirando hacia adelante. No flexione o extienda el cuello mientras hace ejercicios isomtricos. 3. Empuje hacia adelante con la cabeza y el cuello mientras que con la mano ejerce cierta presin hacia el lado contrario. Mantenga esta posicin durante  10segundos. 4. Vuelva a realizar la secuencia, esta vez poniendo la mano contra la nuca. Use la cabeza y el cuello para empujar en la direccin contraria a la presin que ejerce con la mano. 5. Para terminar, haga el mismo ejercicio en cada lado de la cabeza, empujando hacia los lados en la direccin contraria a la presin de Insurance risk surveyor. Repita __________ veces. Realice este ejercicio __________ veces al da. Levantamientos de la cabeza desde la posicin decbito prono 1. Acustese boca abajo (posicin prona) y [employer] codos de modo que el pecho y la parte superior de la espalda se eleven. 2. Comience con la cabeza mirando hacia abajo, cerca del pecho. Coloque el mentn cerca del pecho o [place]. 3. Eleve lentamente la cabeza. Hgalo hasta quedar mirando hacia adelante. Luego contine elevando y estirando la cabeza hacia atrs lo ms que pueda con comodidad. 4. Mantenga la cabeza elevada durante 5segundos. A continuacin, bjela lentamente hasta la posicin inicial. Repita __________ veces. Realice este ejercicio __________ veces al da. Levantamientos de la cabeza desde la posicin decbito supino 1. Acustese sobre la espalda (posicin decbito supino), doble las rodillas apuntando hacia el techo y [employer] pies apoyados en el piso. 2. Levante lentamente la cabeza del suelo, y eleve el mentn hacia el [place]. 3. Mantenga esta posicin durante 5segundos. Repita __________ veces. Realice este ejercicio __________ veces al da. Retraccin escapular 1. Prese con los brazos a los costados. Mire hacia adelante. 2. Lentamente, lleve los hombros (escpulas) hacia atrs y hacia abajo (retraccin) hasta sentir que la zona de los omplatos, en la parte alta de la espalda, se estira. 3. Mantenga esta posicin durante 10 a 30segundos. 4. Reljese y luego repita el ejercicio. Repita __________ veces. Realice este ejercicio __________ veces al da. Comunquese con un mdico si:  El dolor o las  molestias en el cuello se vuelven mucho ms intensos cuando hace un ejercicio.  El dolor o las molestias en el cuello no mejoran en el trmino de las 2horas posteriores a Therapist, art. Si tiene alguno de [employer], deje de [employer] ejercicios de inmediato. No vuelva a hacer los ejercicios a menos que el mdico lo autorice. Solicite ayuda inmediatamente si:  Siente un dolor sbito e intenso en el cuello. Si esto ocurre, deje de [employer] ejercicios de inmediato. No vuelva a hacer los ejercicios a menos que el mdico lo autorice. Esta informacin no tiene Marine scientist el consejo del mdico. Asegrese de hacerle al mdico cualquier pregunta que tenga. Document Released: 12/08/2015 Document Revised: 10/13/2018 Document Reviewed: 10/13/2018 Elsevier Patient Education  2020 [employer].

## 2019-06-22 LAB — LIPID PANEL
Chol/HDL Ratio: 3.2 ratio (ref 0.0–4.4)
Cholesterol, Total: 182 mg/dL (ref 100–199)
HDL: 57 mg/dL (ref >39)
LDL Calculated: 107 mg/dL — ABNORMAL HIGH (ref 0–99)
Triglycerides: 88 mg/dL (ref 0–149)
VLDL Cholesterol Cal: 18 mg/dL (ref 5–40)

## 2019-06-22 LAB — HEMOGLOBIN A1C
Est. average glucose Bld gHb Est-mCnc: 97 mg/dL
Hgb A1c MFr Bld: 5 % (ref 4.8–5.6)

## 2019-06-23 ENCOUNTER — Telehealth: Payer: Self-pay

## 2019-06-23 NOTE — Telephone Encounter (Signed)
Notes recorded by Marval Regal, RN on 06/23/2019 at 4:01 PM EDT  I called pt on intpreter line spanish speaking at [facility] [phone]. I stated her lipid panel LDL was high and to start back taking the pravachol 20mg  daily. Pt stated she has a rx at home and will start today. I stated to take medication in the evening around dinner time. I stated per Dr.SEthi he wants her to follow up with her PCP for ongoing refills and blood work. PT verbalized understanding.  ------

## 2019-06-23 NOTE — Telephone Encounter (Signed)
-----   Message from Garvin Fila, MD sent at 06/22/2019 10:44 AM EDT ----- Mitchell Heir inform the patient that lipid profile suggest the bad cholesterol is high and that she needs to go back on Pravachol 20 mg daily which she was previously taking.

## 2019-06-30 ENCOUNTER — Ambulatory Visit: Payer: Medicaid Other | Admitting: Adult Health

## 2019-07-05 ENCOUNTER — Other Ambulatory Visit: Payer: Self-pay

## 2019-07-05 ENCOUNTER — Ambulatory Visit (INDEPENDENT_AMBULATORY_CARE_PROVIDER_SITE_OTHER): Payer: Self-pay | Admitting: Physician Assistant

## 2019-07-05 ENCOUNTER — Encounter: Payer: Self-pay | Admitting: Physician Assistant

## 2019-07-05 VITALS — BP 126/82 | HR 87 | Temp 98.7°F | Ht 64.0 in | Wt 126.4 lb

## 2019-07-05 DIAGNOSIS — I10 Essential (primary) hypertension: Secondary | ICD-10-CM

## 2019-07-05 DIAGNOSIS — E785 Hyperlipidemia, unspecified: Secondary | ICD-10-CM

## 2019-07-05 DIAGNOSIS — R221 Localized swelling, mass and lump, neck: Secondary | ICD-10-CM

## 2019-07-05 HISTORY — DX: Essential (primary) hypertension: I10

## 2019-07-05 LAB — TSH: TSH: 1.45 u[IU]/mL (ref 0.35–4.50)

## 2019-07-05 NOTE — Progress Notes (Signed)
Carla [name] is a 48 y.o. female here for a new problem.  I acted as a Education administrator for [employer], PA-C [employer], LPN.  History of Present Illness:   Chief Complaint  Patient presents with  . Hypertension  . Hyperlipidemia   Seen with in-person spanish interpreter.  HPI   HTN Currently not taking any medications. At home blood pressure readings are: 130/80 or lower per patient. Patient denies chest pain, SOB, blurred vision, dizziness, unusual headaches, lower leg swelling.  Denies excessive caffeine intake, stimulant usage, excessive alcohol intake, or increase in salt consumption.  HLD Recently seen by neurology. She was recommended to resume Pravachol 20 mg daily. Sometimes this medication causes slight swelling below her chin. She states that if she takes breaks from the medication these side effects resolve. Denies difficulty breathing or feeling like her neck is closing up.    Past Medical History:  Diagnosis Date  . Depression   . Frequent headaches   . 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 Hyperlipidemia   . Hypertension   . Stroke [facility])    09/2015     Social History   Socioeconomic History  . Marital status: Married    Spouse name: Not on file  . Number of children: Not on file  . Years of education: Not on file  . Highest education level: Not on file  Occupational History  . Occupation: Furniture conservator/restorer  Social Needs  . Financial resource strain: Not on file  . Food insecurity    Worry: Not on file    Inability: Not on file  . Transportation needs    Medical: Not on file    Non-medical: Not on file  Tobacco Use  . Smoking status: Never Smoker  . Smokeless tobacco: Never Used  Substance and Sexual Activity  . Alcohol use: No  . Drug use: No  . Sexual activity: Not Currently    Birth control/protection: None  Lifestyle  . Physical activity    Days per week: Not on file    Minutes per session: Not on file  . Stress: Not on file  Relationships  .  Social Herbalist on phone: Not on file    Gets together: Not on file    Attends religious service: Not on file    Active member of club or organization: Not on file    Attends meetings of clubs or organizations: Not on file    Relationship status: Not on file  . Intimate partner violence    Fear of current or ex partner: Not on file    Emotionally abused: Not on file    Physically abused: Not on file    Forced sexual activity: Not on file  Other Topics Concern  . Not on file  Social History Narrative   From [place]   Lived in [place] for 20 years, and been in [place] for 6 years   Husband and son, 71   Works: clean homes    Past Surgical History:  Procedure Laterality Date  . RIGHT ARM SURGERY    . TEE WITHOUT CARDIOVERSION N/A 10/13/2015   Procedure: TRANSESOPHAGEAL ECHOCARDIOGRAM (TEE);  Surgeon: Lelon Perla, MD;  Location: [facility] ENDOSCOPY;  Service: Cardiovascular;  Laterality: N/A;    Family History  Problem Relation Age of Onset  . Leukemia Mother   . Colon cancer Brother   . Asthma Neg Hx   . Cancer Neg Hx   . Diabetes Neg Hx   .  Hyperlipidemia Neg Hx   . Heart failure Neg Hx   . Hypertension Neg Hx   . Migraines Neg Hx   . Rashes / Skin problems Neg Hx   . Seizures Neg Hx   . Stroke Neg Hx   . Thyroid disease Neg Hx     Allergies  Allergen Reactions  . Lipitor [Atorvastatin Calcium] Swelling    throat  . Tramadol Nausea Only    Current Medications:   Current Outpatient Medications:  .  aspirin 81 MG tablet, Take 1 tablet (81 mg total) by mouth daily., Disp: 30 tablet, Rfl: 1 .  eletriptan (RELPAX) 20 MG tablet, Take 1 tablet (20 mg total) by mouth as needed for migraine or headache. May repeat in 2 hours if headache persists or recurs., Disp: 10 tablet, Rfl: 0 .  pravastatin (PRAVACHOL) 20 MG tablet, Take 20 mg by mouth daily., Disp: , Rfl:  .  atenolol (TENORMIN) 50 MG tablet, Take 50 mg by mouth daily., Disp: , Rfl:  .  BYSTOLIC 5 MG tablet,  Take 5 mg by mouth daily., Disp: , Rfl:  .  topiramate (TOPAMAX) 25 MG tablet, Take 1 tablet (25 mg total) by mouth 2 (two) times daily., Disp: 120 tablet, Rfl: 3   Review of Systems:   ROS Negative unless otherwise specified per HPI.  Vitals:   Vitals:   07/05/19 1313 07/05/19 1351  BP: (!) 140/100 126/82  Pulse: 87   Temp: 98.7 F (37.1 C)   TempSrc: Temporal   SpO2: 97%   Weight: 126 lb 6.1 oz (57.3 kg)   Height: 5\' 4"  (1.626 m)      Body mass index is 21.69 kg/m.  Physical Exam:   Physical Exam Vitals signs and nursing note reviewed.  Constitutional:      General: She is not in acute distress.    Appearance: She is well-developed. She is not ill-appearing or toxic-appearing.  Neck:     Musculoskeletal: Full passive range of motion without pain and normal range of motion.  Cardiovascular:     Rate and Rhythm: Normal rate and regular rhythm.     Pulses: Normal pulses.     Heart sounds: Normal heart sounds, S1 normal and S2 normal.     Comments: No LE edema Pulmonary:     Effort: Pulmonary effort is normal.     Breath sounds: Normal breath sounds.  Skin:    General: Skin is warm and dry.  Neurological:     Mental Status: She is alert.     GCS: GCS eye subscore is 4. GCS verbal subscore is 5. GCS motor subscore is 6.  Psychiatric:        Speech: Speech normal.        Behavior: Behavior normal. Behavior is cooperative.     Assessment and Plan:   Domitila was seen today for hypertension and hyperlipidemia.  Diagnoses and all orders for this visit:  Hyperlipidemia, unspecified hyperlipidemia type Continue Pravachol 20 mg daily. No red flags on her exam. If she develops worsening or changes in side effects of her cholesterol medication, I recommended that she discontinue the medication and seek medical treatment if needed.  Essential hypertension Well controlled on recheck. Continue to not treat as she is within goal and she is very symptomatic to slightly low  bp. Provided log and parameters (<130/80) and recommended follow-up 3 months, sooner if needed. -     TSH  . Reviewed expectations re: course of current medical issues. Marland Kitchen  Discussed self-management of symptoms. . Outlined signs and symptoms indicating need for more acute intervention. . Patient verbalized understanding and all questions were answered. . See orders for this visit as documented in the electronic medical record. . Patient received an After-Visit Summary.  CMA or LPN served as scribe during this visit. History, Physical, and Plan performed by medical provider. The above documentation has been reviewed and is accurate and complete.  I spent 25 minutes with this patient, greater than 50% was face-to-face time counseling regarding the above diagnoses.   [name], PA-C

## 2019-07-05 NOTE — Patient Instructions (Signed)
It was great to see you!  1. Take pravachol 20 mg daily  2. Check your blood pressure at least 3 times a week. Your blood pressure goal is UNDER 130/90. If your blood pressure is consistently higher than this, you need to make an appointment to see me.  Let's follow-up in 3 months, sooner if you have concerns.  Take care,  [name] PA-C

## 2019-07-22 ENCOUNTER — Other Ambulatory Visit: Payer: Self-pay

## 2019-07-22 DIAGNOSIS — Z20822 Contact with and (suspected) exposure to covid-19: Secondary | ICD-10-CM

## 2019-07-24 LAB — NOVEL CORONAVIRUS, NAA: SARS-CoV-2, NAA: DETECTED — AB

## 2019-07-26 ENCOUNTER — Other Ambulatory Visit: Payer: Self-pay

## 2019-07-26 ENCOUNTER — Emergency Department (HOSPITAL_COMMUNITY)
Admission: EM | Admit: 2019-07-26 | Discharge: 2019-07-26 | Disposition: A | Discharge status: Home / Self Care | Payer: Self-pay | Attending: Emergency Medicine | Admitting: Emergency Medicine

## 2019-07-26 ENCOUNTER — Encounter (HOSPITAL_COMMUNITY): Payer: Self-pay | Admitting: Emergency Medicine

## 2019-07-26 ENCOUNTER — Emergency Department (HOSPITAL_COMMUNITY): Payer: Self-pay

## 2019-07-26 DIAGNOSIS — U071 COVID-19: Secondary | ICD-10-CM | POA: Insufficient documentation

## 2019-07-26 DIAGNOSIS — R0602 Shortness of breath: Secondary | ICD-10-CM | POA: Insufficient documentation

## 2019-07-26 DIAGNOSIS — R079 Chest pain, unspecified: Secondary | ICD-10-CM

## 2019-07-26 DIAGNOSIS — Z79899 Other long term (current) drug therapy: Secondary | ICD-10-CM | POA: Insufficient documentation

## 2019-07-26 LAB — BASIC METABOLIC PANEL
Anion gap: 9 (ref 5–15)
BUN: 11 mg/dL (ref 6–20)
CO2: 24 mmol/L (ref 22–32)
Calcium: 9.1 mg/dL (ref 8.9–10.3)
Chloride: 108 mmol/L (ref 98–111)
Creatinine, Ser: 0.71 mg/dL (ref 0.44–1.00)
GFR calc Af Amer: >60 mL/min (ref >60)
GFR calc non Af Amer: >60 mL/min (ref >60)
Glucose, Bld: 93 mg/dL (ref 70–99)
Potassium: 3.9 mmol/L (ref 3.5–5.1)
Sodium: 141 mmol/L (ref 135–145)

## 2019-07-26 LAB — CBC
HCT: 45.3 % (ref 36.0–46.0)
Hemoglobin: 14.5 g/dL (ref 12.0–15.0)
MCH: 29.7 pg (ref 26.0–34.0)
MCHC: 32 g/dL (ref 30.0–36.0)
MCV: 92.8 fL (ref 80.0–100.0)
Platelets: 304 10*3/uL (ref 150–400)
RBC: 4.88 MIL/uL (ref 3.87–5.11)
RDW: 12.4 % (ref 11.5–15.5)
WBC: 6.4 10*3/uL (ref 4.0–10.5)
nRBC: 0 % (ref 0.0–0.2)

## 2019-07-26 LAB — TROPONIN I (HIGH SENSITIVITY)
Troponin I (High Sensitivity): 4 ng/L (ref <18)
Troponin I (High Sensitivity): 4 ng/L (ref <18)

## 2019-07-26 LAB — I-STAT BETA HCG BLOOD, ED (MC, WL, AP ONLY): I-stat hCG, quantitative: <5 m[IU]/mL (ref <5)

## 2019-07-26 MED ORDER — CYCLOBENZAPRINE HCL 10 MG PO TABS: 10.0000 mg | ORAL_TABLET | Freq: Two times a day (BID) | ORAL | 0 refills | Status: DC | PRN

## 2019-07-26 MED ORDER — SODIUM CHLORIDE 0.9% FLUSH: 3.0000 mL | Freq: Once | INTRAVENOUS | Status: DC

## 2019-07-26 NOTE — ED Provider Notes (Signed)
[facility] EMERGENCY DEPARTMENT Provider Note   CSN: [id number] Arrival date & time: 07/26/19  0330     History   Chief Complaint Chief Complaint  Patient presents with  . Chest Pain  . Shortness of Breath  . COVID+    HPI Carla [name] is a 48 y.o. female.     The history is provided by the patient and medical records. No language interpreter was used.  Shortness of Breath Severity:  Moderate Onset quality:  Gradual Duration:  3 days Timing:  Constant Progression:  Unchanged Chronicity:  New Relieved by:  Nothing Worsened by:  Nothing Ineffective treatments:  None tried Associated symptoms: chest pain and cough   Associated symptoms: no abdominal pain, no diaphoresis, no fever, no headaches, no neck pain, no rash, no sputum production, no syncope, no vomiting and no wheezing     Past Medical History:  Diagnosis Date  . Depression   . Frequent headaches   . 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 Hyperlipidemia   . Hypertension   . Stroke [facility])    09/2015    Patient Active Problem List   Diagnosis Date Noted  . Essential hypertension 07/05/2019  . Left knee pain 01/08/2018  . 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 Premature ventricular contractions 03/25/2016  . Hyperlipidemia 03/25/2016  . Benign paroxysmal positional vertigo 03/11/2016  . Neck pain on right side 02/19/2016  . Cryptogenic stroke ([place]) 12/28/2015  . Multiple hemangiomas   . Cerebral infarction due to unspecified mechanism   . 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 Migraines 10/11/2015    Past Surgical History:  Procedure Laterality Date  . RIGHT ARM SURGERY    . TEE WITHOUT CARDIOVERSION N/A 10/13/2015   Procedure: TRANSESOPHAGEAL ECHOCARDIOGRAM (TEE);  Surgeon: Lelon Perla, MD;  Location: [facility] ENDOSCOPY;  Service: Cardiovascular;  Laterality: N/A;     OB History    Gravida  2   Para  2   Term  1   Preterm  1   AB  0   Living  2     SAB  0   TAB  0   Ectopic   0   Multiple  0   Live Births  2            Home Medications    Prior to Admission medications   Medication Sig Start Date End Date Taking? Authorizing Provider  aspirin 81 MG tablet Take 1 tablet (81 mg total) by mouth daily. 12/30/18   Claris Gower, NP  atenolol (TENORMIN) 50 MG tablet Take 50 mg by mouth daily. 06/12/19   [provider]  BYSTOLIC 5 MG tablet Take 5 mg by mouth daily. 06/27/19   [provider]  eletriptan (RELPAX) 20 MG tablet Take 1 tablet (20 mg total) by mouth as needed for migraine or headache. May repeat in 2 hours if headache persists or recurs. 06/21/19   Garvin Fila, MD  pravastatin (PRAVACHOL) 20 MG tablet Take 20 mg by mouth daily.    [provider]  topiramate (TOPAMAX) 25 MG tablet Take 1 tablet (25 mg total) by mouth 2 (two) times daily. 06/21/19   Garvin Fila, MD    Family History Family History  Problem Relation Age of Onset  . Leukemia Mother   . Colon cancer Brother   . Asthma Neg Hx   . Cancer Neg Hx   . Diabetes Neg Hx   . Hyperlipidemia Neg Hx   . Heart  failure Neg Hx   . Hypertension Neg Hx   . Migraines Neg Hx   . Rashes / Skin problems Neg Hx   . Seizures Neg Hx   . Stroke Neg Hx   . Thyroid disease Neg Hx     Social History Social History   Tobacco Use  . Smoking status: Never Smoker  . Smokeless tobacco: Never Used  Substance Use Topics  . Alcohol use: No  . Drug use: No     Allergies   Lipitor [atorvastatin calcium] and Tramadol   Review of Systems Review of Systems  Constitutional: Positive for chills and fatigue. Negative for diaphoresis and fever.  HENT: Negative for congestion.   Eyes: Negative for visual disturbance.  Respiratory: Positive for cough and shortness of breath. Negative for sputum production, choking, chest tightness, wheezing and stridor.   Cardiovascular: Positive for chest pain. Negative for palpitations, leg swelling and syncope.  Gastrointestinal:  Negative for abdominal pain, constipation, diarrhea, nausea and vomiting.  Genitourinary: Negative for dysuria and flank pain.  Musculoskeletal: Negative for back pain, neck pain and neck stiffness.  Skin: Negative for rash.  Neurological: Negative for light-headedness, numbness and headaches.  Psychiatric/Behavioral: Negative for agitation.  All other systems reviewed and are negative.    Physical Exam Updated Vital Signs BP (!) 131/91   Pulse 70   Temp 98.5 F (36.9 C) (Oral)   Resp 12   Ht 5' (1.524 m)   Wt 54.4 kg   LMP 07/19/2019 (Exact Date)   SpO2 98%   BMI 23.44 kg/m   Physical Exam Vitals signs and nursing note reviewed.  Constitutional:      General: She is not in acute distress.    Appearance: She is well-developed. She is not ill-appearing, toxic-appearing or diaphoretic.  HENT:     Head: Normocephalic and atraumatic.     Right Ear: External ear normal.     Left Ear: External ear normal.     Nose: Nose normal.     Mouth/Throat:     Pharynx: No oropharyngeal exudate.  Eyes:     Conjunctiva/sclera: Conjunctivae normal.     Pupils: Pupils are equal, round, and reactive to light.  Neck:     Musculoskeletal: Normal range of motion and neck supple.  Cardiovascular:     Rate and Rhythm: Normal rate.     Heart sounds: Normal heart sounds. No murmur.  Pulmonary:     Effort: Pulmonary effort is normal. No respiratory distress.     Breath sounds: No stridor. No decreased breath sounds, wheezing, rhonchi or rales.  Abdominal:     General: There is no distension.     Tenderness: There is no abdominal tenderness. There is no rebound.  Musculoskeletal:     Right lower leg: She exhibits no tenderness. No edema.     Left lower leg: She exhibits no tenderness. No edema.  Skin:    General: Skin is warm.     Capillary Refill: Capillary refill takes less than 2 seconds.     Findings: No erythema or rash.  Neurological:     General: No focal deficit present.      Mental Status: She is alert and oriented to person, place, and time.     Motor: No abnormal muscle tone.     Coordination: Coordination normal.     Deep Tendon Reflexes: Reflexes are normal and symmetric.  Psychiatric:        Mood and Affect: Mood normal.  ED Treatments / Results  Labs (all labs ordered are listed, but only abnormal results are displayed) Labs Reviewed  BASIC METABOLIC PANEL  CBC  I-STAT BETA HCG BLOOD, ED ([facility], [facility], [facility] ONLY)  TROPONIN I (HIGH SENSITIVITY)  TROPONIN I (HIGH SENSITIVITY)    EKG EKG Interpretation  Date/Time:  Monday July 26 2019 06:41:36 EDT Ventricular Rate:  66 PR Interval:  140 QRS Duration: 98 QT Interval:  385 QTC Calculation: 404 R Axis:   55 Text Interpretation:  Sinus rhythm when compared to prior, no significant changes seen.  No STEMI Confirmed by Antony Blackbird [phone]) on 07/26/2019 7:07:33 AM   Radiology Dg Chest Portable 1 View  Result Date: 07/26/2019 CLINICAL DATA:  Chest pain,: Covid positive. EXAM: PORTABLE CHEST 1 VIEW COMPARISON:  Chest x-rays 06/12/2018, 05/11/2016 FINDINGS: The heart size and mediastinal contours are within normal limits. The lungs are clear without focal pulmonary infiltrate. No pneumothorax or pleural effusion. Chronic deformity in the proximal right humerus may represent sequela of prior trauma. Upper abdomen unremarkable. IMPRESSION: No active disease. Electronically Signed   By: Audie Pinto M.D.   On: 07/26/2019 08:41    Procedures Procedures (including critical care time)  Medications Ordered in ED Medications  sodium chloride flush (NS) 0.9 % injection 3 mL (3 mLs Intravenous Not Given 07/26/19 0641)   Carla [name] was evaluated in Emergency Department on 07/26/2019 for the symptoms described in the history of present illness. She was evaluated in the context of the global COVID-19 pandemic, which necessitated consideration that the patient might be at risk for infection with the  SARS-CoV-2 virus that causes COVID-19. Institutional protocols and algorithms that pertain to the evaluation of patients at risk for COVID-19 are in a state of rapid change based on information released by regulatory bodies including the CDC and federal and state organizations. These policies and algorithms were followed during the patient's care in the ED.    Initial Impression / Assessment and Plan / ED Course  I have reviewed the triage vital signs and the nursing notes.  Pertinent labs & imaging results that were available during my care of the patient were reviewed by me and considered in my medical decision making (see chart for details).        Carla [name] is a 48 y.o. female with a past medical history significant for stroke, hyperlipidemia, hypertension, and migraines who presents with fevers, chills, congestion, cough, shortness breath, chest pain.  Patient reports that for the last 3 or 4 days she is been having the symptoms.  She reports that she has had family was about similar symptoms at home.  Patient is had a cough that is dry.  She reports some chest pressure in the central chest that does not radiate.  She denies nausea, vomiting, urinary symptoms or GI symptoms.  She reports no shortness of breath with a cough.  She reports the pain is pleuritic but is not exertional.  She reports that the pain is moderate.  She says that she was tested for coronavirus Carla [name] days ago but does not know the results.  She presents for evaluation  On exam, lungs are clear and chest is nontender.  Abdomen is nontender.  Good pulses in all extremities.  No lower extremity tenderness or edema.  No history of DVT or PE reported.  Patient resting comfortably with no tachycardia, fever, or hypoxia.  Patient is on room air.  Eduard Clos shows that patient's coronavirus test actually was positive several days  ago so patient is positive for COVID-19.  Patient had labs that were reassuring on arrival however she  has not yet had chest x-ray to look for pneumonia.  EKG reassuring.  Anticipate discharge given the patient's lack of hypoxia after chest x-ray looks for pneumonia.   X-ray shows no pneumonia.  Suspect symptoms are related to musculoskeletal pain related to the coughing and her coronavirus diagnosis.  Patient given instructions on self quarantine as well as have her family quarantine as they are having similar symptoms.  Patient requesting help with the pain.  Patient reports that she has allergic reaction to tramadol and does not want to start narcotic mid medication, will instead give a muscle relaxant as I suspect her symptoms are due to musculoskeletal chest wall pain.  Patient understands return precautions and follow-up instructions.  Patient discharged in good condition.     Final Clinical Impressions(s) / ED Diagnoses   Final diagnoses:  COVID-19  Chest pain, unspecified type  Shortness of breath    ED Discharge Orders         Ordered    cyclobenzaprine (FLEXERIL) 10 MG tablet  2 times daily PRN     07/26/19 1111          Clinical Impression: 1. COVID-19   2. Chest pain, unspecified type   3. Shortness of breath     Disposition: Discharge  Condition: Good  I have discussed the results, Dx and Tx plan with the pt(& family if present). He/she/they expressed understanding and agree(s) with the plan. Discharge instructions discussed at great length. Strict return precautions discussed and pt &/or family have verbalized understanding of the instructions. No further questions at time of discharge.    New Prescriptions   CYCLOBENZAPRINE (FLEXERIL) 10 MG TABLET    Take 1 tablet (10 mg total) by mouth 2 (two) times daily as needed for muscle spasms.    Follow Up: [name], [place] [address] [facility] [id number] [place]  [phone]       , Gwenyth Allegra, MD 07/26/19 [phone]

## 2019-07-26 NOTE — Discharge Instructions (Signed)
Your work-up today showed no evidence of pneumonia or other significant abnormalities.  Your coronavirus test himself with ago was positive and we suspect your pain is due to the coronavirus infection and musculoskeletal chest wall pain.  As your oxygen has been normal, we do not feel he needs admission or antibiotics at this time.  As you had an allergic reaction to narcotics in the past, we will give you prescription for muscle relaxant to help with the musculoskeletal pain.  Please follow-up with your primary doctor.  If any symptoms change or worsen, please return to the nearest emergency department.  Please have your family isolate as well.        Person Under Monitoring Name: Carla [name]  Location: [address] [place] [address]   Infection Prevention Recommendations for Individuals Confirmed to have, or Being Evaluated for, 2019 Novel Coronavirus (COVID-19) Infection Who Receive Care at Home  Individuals who are confirmed to have, or are being evaluated for, COVID-19 should follow the prevention steps below until a healthcare provider or local or state health department says they can return to normal activities.  Stay home except to get medical care You should restrict activities outside your home, except for getting medical care. Do not go to work, school, or public areas, and do not use public transportation or taxis.  Call ahead before visiting your doctor Before your medical appointment, call the healthcare provider and tell them that you have, or are being evaluated for, COVID-19 infection. This will help the healthcare providers office take steps to keep other people from getting infected. Ask your healthcare provider to call the local or state health department.  Monitor your symptoms Seek prompt medical attention if your illness is worsening (e.g., difficulty breathing). Before going to your medical appointment, call the healthcare provider and tell them that  you have, or are being evaluated for, COVID-19 infection. Ask your healthcare provider to call the local or state health department.  Wear a facemask You should wear a facemask that covers your nose and mouth when you are in the same room with other people and when you visit a healthcare provider. People who live with or visit you should also wear a facemask while they are in the same room with you.  Separate yourself from other people in your home As much as possible, you should stay in a different room from other people in your home. Also, you should use a separate bathroom, if available.  Avoid sharing household items You should not share dishes, drinking glasses, cups, eating utensils, towels, bedding, or other items with other people in your home. After using these items, you should wash them thoroughly with soap and water.  Cover your coughs and sneezes Cover your mouth and nose with a tissue when you cough or sneeze, or you can cough or sneeze into your sleeve. Throw used tissues in a lined trash can, and immediately wash your hands with soap and water for at least 20 seconds or use an alcohol-based hand rub.  Wash your [facility] your hands often and thoroughly with soap and water for at least 20 seconds. You can use an alcohol-based hand sanitizer if soap and water are not available and if your hands are not visibly dirty. Avoid touching your eyes, nose, and mouth with unwashed hands.   Prevention Steps for Caregivers and Household Members of Individuals Confirmed to have, or Being Evaluated for, COVID-19 Infection Being Cared for in the Home  If  you live with, or provide care at home for, a person confirmed to have, or being evaluated for, COVID-19 infection please follow these guidelines to prevent infection:  Follow healthcare providers instructions Make sure that you understand and can help the patient follow any healthcare provider instructions for all  care.  Provide for the patients basic needs You should help the patient with basic needs in the home and provide support for getting groceries, prescriptions, and other personal needs.  Monitor the patients symptoms If they are getting sicker, call his or her medical provider and tell them that the patient has, or is being evaluated for, COVID-19 infection. This will help the healthcare providers office take steps to keep other people from getting infected. Ask the healthcare provider to call the local or state health department.  Limit the number of people who have contact with the patient If possible, have only one caregiver for the patient. Other household members should stay in another home or place of residence. If this is not possible, they should stay in another room, or be separated from the patient as much as possible. Use a separate bathroom, if available. Restrict visitors who do not have an essential need to be in the home.  Keep older adults, very young children, and other sick people away from the patient Keep older adults, very young children, and those who have compromised immune systems or chronic health conditions away from the patient. This includes people with chronic heart, lung, or kidney conditions, diabetes, and cancer.  Ensure good ventilation Make sure that shared spaces in the home have good air flow, such as from an air conditioner or an opened window, weather permitting.  Wash your hands often Wash your hands often and thoroughly with soap and water for at least 20 seconds. You can use an alcohol based hand sanitizer if soap and water are not available and if your hands are not visibly dirty. Avoid touching your eyes, nose, and mouth with unwashed hands. Use disposable paper towels to dry your hands. If not available, use dedicated cloth towels and replace them when they become wet.  Wear a facemask and gloves Wear a disposable facemask at all times in  the room and gloves when you touch or have contact with the patients blood, body fluids, and/or secretions or excretions, such as sweat, saliva, sputum, nasal mucus, vomit, urine, or feces.  Ensure the mask fits over your nose and mouth tightly, and do not touch it during use. Throw out disposable facemasks and gloves after using them. Do not reuse. Wash your hands immediately after removing your facemask and gloves. If your personal clothing becomes contaminated, carefully remove clothing and launder. Wash your hands after handling contaminated clothing. Place all used disposable facemasks, gloves, and other waste in a lined container before disposing them with other household waste. Remove gloves and wash your hands immediately after handling these items.  Do not share dishes, glasses, or other household items with the patient Avoid sharing household items. You should not share dishes, drinking glasses, cups, eating utensils, towels, bedding, or other items with a patient who is confirmed to have, or being evaluated for, COVID-19 infection. After the person uses these items, you should wash them thoroughly with soap and water.  Wash laundry thoroughly Immediately remove and wash clothes or bedding that have blood, body fluids, and/or secretions or excretions, such as sweat, saliva, sputum, nasal mucus, vomit, urine, or feces, on them. Wear gloves when handling laundry from the  patient. Read and follow directions on labels of laundry or clothing items and detergent. In general, wash and dry with the warmest temperatures recommended on the label.  Clean all areas the individual has used often Clean all touchable surfaces, such as counters, tabletops, doorknobs, bathroom fixtures, toilets, phones, keyboards, tablets, and bedside tables, every day. Also, clean any surfaces that may have blood, body fluids, and/or secretions or excretions on them. Wear gloves when cleaning surfaces the patient has  come in contact with. Use a diluted bleach solution (e.g., dilute bleach with 1 part bleach and 10 parts water) or a household disinfectant with a label that says EPA-registered for coronaviruses. To make a bleach solution at home, add 1 tablespoon of bleach to 1 quart (4 cups) of water. For a larger supply, add  cup of bleach to 1 gallon (16 cups) of water. Read labels of cleaning products and follow recommendations provided on product labels. Labels contain instructions for safe and effective use of the cleaning product including precautions you should take when applying the product, such as wearing gloves or eye protection and making sure you have good ventilation during use of the product. Remove gloves and wash hands immediately after cleaning.  Monitor yourself for signs and symptoms of illness Caregivers and household members are considered close contacts, should monitor their health, and will be asked to limit movement outside of the home to the extent possible. Follow the monitoring steps for close contacts listed on the symptom monitoring form.   ? If you have additional questions, contact your local health department or call the epidemiologist on call at [phone] (available 24/7). ? This guidance is subject to change. For the most up-to-date guidance from [facility], please refer to their website: [email or web address]

## 2019-07-26 NOTE — ED Notes (Signed)
Pt discharge instructions reviewed with pt. Pt verbalized understanding of instructions. Pt unable to sign due to signature pad not turning on. Pt discharged.

## 2019-07-26 NOTE — ED Triage Notes (Addendum)
Pt reports chest pain and SOB for the past 4 days. Pt reports same got worse and she thought she should come in for evaluation. At the end of triage pt reports already being seen by a doctor for same.   Pt initially advised she was not exposed to the [place] virus however after chart review pt has a positive test from 07/22/2019.

## 2019-08-06 ENCOUNTER — Other Ambulatory Visit: Payer: Self-pay | Admitting: *Deleted

## 2019-08-06 DIAGNOSIS — Z20822 Contact with and (suspected) exposure to covid-19: Secondary | ICD-10-CM

## 2019-08-08 LAB — NOVEL CORONAVIRUS, NAA: SARS-CoV-2, NAA: NOT DETECTED

## 2019-09-02 ENCOUNTER — Encounter: Payer: Self-pay | Admitting: Adult Health

## 2019-09-02 ENCOUNTER — Other Ambulatory Visit: Payer: Self-pay

## 2019-09-02 ENCOUNTER — Ambulatory Visit (INDEPENDENT_AMBULATORY_CARE_PROVIDER_SITE_OTHER): Payer: Self-pay | Admitting: Adult Health

## 2019-09-02 VITALS — BP 135/91 | HR 95 | Temp 98.4°F | Ht 60.0 in | Wt 125.2 lb

## 2019-09-02 DIAGNOSIS — E785 Hyperlipidemia, unspecified: Secondary | ICD-10-CM

## 2019-09-02 DIAGNOSIS — G43009 Migraine without aura, not intractable, without status migrainosus: Secondary | ICD-10-CM

## 2019-09-02 DIAGNOSIS — Z8673 Personal history of transient ischemic attack (TIA), and cerebral infarction without residual deficits: Secondary | ICD-10-CM

## 2019-09-02 DIAGNOSIS — I10 Essential (primary) hypertension: Secondary | ICD-10-CM

## 2019-09-02 MED ORDER — UBRELVY 50 MG PO TABS: 50.0000 mg | ORAL_TABLET | Freq: Every day | ORAL | 0 refills | Status: DC | PRN

## 2019-09-02 MED ORDER — AMITRIPTYLINE HCL 25 MG PO TABS: 25.0000 mg | ORAL_TABLET | Freq: Every day | ORAL | 3 refills | Status: DC

## 2019-09-02 NOTE — Patient Instructions (Signed)
Your Plan:  Carla [name] 50mg  tablet at migraine onset.  May repeat x1 if needed after 2 hours.  After 1 week, if this is working well for you please call office and order will be placed  May consider referral to our headache specialist, Dr. Jaynee Eagles  Continue to follow with PCP for secondary stroke risk factor management and monitoring  Follow-up in 4 months or call earlier if needed     Thank you for coming to see [place] at [facility]. I hope we have been able to provide you high quality care today.  You may receive a patient satisfaction survey over the next few weeks. We would appreciate your feedback and comments so that we may continue to improve ourselves and the health of our patients.

## 2019-09-02 NOTE — Progress Notes (Signed)
[name] [address] [place]. [place] [address] [phone]       OFFICE CONSULT NOTE  Carla [name] Date of Birth:  [birth year] Medical Record Number:  [id number]   Referring MD:  Jarold Motto, PA-c Reason for Referral:  headache  Chief Complaint  Patient presents with   Follow-up    Migraine f/u. Interpreter present. Treatment room. Patient stated that she has a throbbing pain on both sides of her head behind her ears.     HPI: 09/02/19    HISTORY SUMMARY: Initial visit 09/29/2018 Dr. Pearlean Brownie:  [name] is a pleasant 48 year Hispanic lady originally from [place] who was seen today for evaluation for headaches.  She is accompanied by her friend as well as Spanish language interpreter who provides translation for this visit.  History is obtained from them, review of electronic medical records and have personally reviewed imaging films.  Patient has been complaining of increasing headaches for the last 2 months.  She describes this headache as being right temporal and frontal mostly throbbing in nature though fluctuating in severity from 6/10 to 10/10.  Headache is accompanied by nausea vomiting light and sound sensitivity.  She has to stop her activities and lie down and sleeping as well as taking Tylenol seems to help.  Headaches are accompanied by a single vision spots and scintillations and blurred vision.  She denies any speech difficulties, vertigo, diplopia, gait or balance difficulties with headaches.  Headaches usually last about 30 to 40 minutes.  Headaches of late have been occurring about 2-3 times per week.  She does give a remote history of infrequent headaches which sound like migraine headaches and was similar but were mostly perimenstrual and not bad.  The patient also states in September 2019 she had a episode of speech difficulties as well as dizziness.  She was seen by primary care physician who states that the patient had a headache as  well as feeling of dizziness and some speech difficulties.  This happened following a stressful event.  An outpatient MRI was ordered which was done on 08/05/2018 which I personally reviewed shows a few nonspecific white matter hyperintensities but no acute abnormality or definite stroke.  Patient states her symptoms improved within a few days but the headaches have persisted since then.  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 MRA of the brain showed slight diminished peripheral branches in the left MCA but no large vessel stenosis or occlusion. Carotid ultrasound showed no significant extracranial stenosis. Transthoracic echo showed normal ejection fraction. Transesophageal echocardiogram showed no cardiac source of embolism or PFO. Antiphospholipid antibodies and lupus anticoagulant were both negative. Lipid profile showed elevated total cholesterol 207 and LDL 139 mg percent. Hemoglobin A1c was 5.6. Patient was started on aspirin as well as protocol. She states her speech difficulty and right-sided weakness completely improved.  She was placed on aspirin and pravastatin which she actually did well on but for unclear reason she discontinued both medications.  She is currently on Lipitor 40 which was started a few months ago by primary physician but she is complaining of dizziness on this medication and wants to change it or stop it.  She states she had slight balance difficulties from her previous stroke but had recovered well from that.  She works as a Advertising copywriter  and has been able to perform her job without problems.  She states her blood pressure is under good control.  Last lipid profile checked in August 2019 showed LDL of 120 following which Lipitor was added.  I had recommended doing a transcranial Doppler bubble study  at last visit with me in 2017 but for some unclear reason this was never done.  Update 12/30/2018: Carla [name] is being seen today for follow-up visit for migraines and is accompanied by interpreter as patient is primarily Spanish-speaking.  Upon initial visit with Dr. Pearlean Brownie on 09/29/2018 (visit info below), headaches were not frequent enough to justify initiating migraine prophylaxis.  She did call back on 12/02/2018 reporting daily migraines and therefore initiated Topamax 25 mg twice daily.  She took this medication for 4 days but discontinued as medication was making her too fatigued.  She consider taking 1 tab at night only but wanted to speak with [place] in regards to this first.  She was evaluated in the ED on 12/19/2018 and returned on 12/23/2018 due to vaginal bleeding.  Apparently, all medications discontinued after 12/23/2018 visit and was started on Provera for 10-day course with recommending follow-up with OB/GYN for further evaluation.  She reports today that her headaches have improved and not experience a headache since ED admission on 12/23/2018.  She does have an appointment scheduled at [facility] on 01/12/2019 for further evaluation.  She denies any continued vaginal bleeding at this time.  Blood pressure today satisfactory 130/68. It was recommended after prior visit to undergo transcranial Doppler with bubble study to assess for possible PFO.  This was obtained on 10/14/2018 and was negative for PFO.  Update 06/21/2019 : She returns for follow-up after last visit 5 months ago.  She is accompanied by a [place] language interpreter who was present throughout the visit and interprets for her.  Patient states her main complaint today is that her migraine headaches seem to have come back.  In the last 3 months she has had increased frequency of headaches which occur 3 to 4 days/week.  The headache starts off as a throbbing pain around her right temple as well as periorbital region becomes unbearable  10/10 in severity.  She has relied on.  Light and sound do worsen the headache.  She does not throw up or feel nauseous.  She does not have visual symptoms with the headaches.  Headache may last for most of the day.  She is unable to identify specific triggers for headaches but does feel tightness in the back of her head and neck muscles.  She has not tried triptans or prescription migraine specific medications she has tried taking Tylenol and ibuprofen without relief.  She has been taking naproxen which seems to work better.  She has been taking them 3 to 4 days a week.  She was previously on Topamax but when she started having dysfunctional uterine bleeding all the medications were stopped including Topamax.  She is unable to tell me if she had any bad side effects on it.  Patient recently changed her primary care physician and a new physician assistant discontinued protocol for unclear reason.  She remains on aspirin which is tolerating well without bruising or bleeding.  Her blood pressure is under good control and today it is 142/83.  She has had no recurrent stroke or TIA symptoms.  Update 09/02/2019: Ms. Christine is being seen today for 53-month follow-up regarding migraine headaches accompanied by interpreter.  After prior visit, Topamax 25  mg daily was restarted due to worsening migraines as well as Relpax for emergent relief.  She did not tolerate Topamax as she reports this caused weight loss and short-term memory loss.  She was unable to afford Relpax.  She has recently been experiencing migraines 2 times weekly and typically lasts for 1 hour total but during this time, migraines are debilitating and continues to be located right temporal area.  She has not trialed any other type of migraine medication.  Recent diagnosis of COVID-19 in 06/2019 and feels as though she has not completely recovered mentally and physically.  After prior visit, lipid panel obtained and recommended restart pravastatin but due to  side effects, PCP initiated Crestor.  She reports difficulty tolerating Crestor but plans on following up with PCP for further monitoring and management.  Blood pressure today 135/91.    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   Hyperlipidemia    Hypertension    Stroke (HCC)    09/2015    Social History:  Social History   Socioeconomic History   Marital status: Married    Spouse name: Not on file   Number of children: Not on file   Years of education: Not on file   Highest education level: Not on file  Occupational History   Occupation: Financial trader  Social Needs   Financial resource strain: Not on file   Food insecurity    Worry: Not on file    Inability: Not on file   Transportation needs    Medical: Not on file    Non-medical: Not on file  Tobacco Use   Smoking status: Never Smoker   Smokeless tobacco: Never Used  Substance and Sexual Activity   Alcohol use: No   Drug use: No   Sexual activity: Not Currently    Birth control/protection: None  Lifestyle   Physical activity    Days per week: Not on file    Minutes per session: Not on file   Stress: Not on file  Relationships   Social connections    Talks on phone: Not on file    Gets together: Not on file    Attends religious service: Not on file    Active member of club or organization: Not on file    Attends meetings of clubs or organizations: Not on file    Relationship status: Not on file   Intimate partner violence    Fear of current or ex partner: Not on file    Emotionally abused: Not on file    Physically abused: Not on file    Forced sexual activity: Not on file  Other Topics Concern   Not on file  Social History Narrative   From [place]   Lived in [place] for 20 years, and been in [place] for 6  years   Husband and son, 90   Works: clean homes    Medications:   Current Outpatient Medications on File Prior to Visit  Medication Sig Dispense Refill   aspirin 81 MG tablet Take 1 tablet (81 mg total) by mouth daily. 30 tablet 1   atenolol (TENORMIN) 50 MG tablet Take 50 mg by mouth daily.     atorvastatin (LIPITOR) 80 MG tablet Take 80 mg by mouth daily.  BYSTOLIC 5 MG tablet Take 5 mg by mouth daily.     cyclobenzaprine (FLEXERIL) 10 MG tablet Take 1 tablet (10 mg total) by mouth 2 (two) times daily as needed for muscle spasms. 20 tablet 0   eletriptan (RELPAX) 20 MG tablet Take 1 tablet (20 mg total) by mouth as needed for migraine or headache. May repeat in 2 hours if headache persists or recurs. 10 tablet 0   metoprolol tartrate (LOPRESSOR) 50 MG tablet Take 50 mg by mouth 2 (two) times daily with a meal.     rosuvastatin (CRESTOR) 40 MG tablet Take 40 mg by mouth daily.     topiramate (TOPAMAX) 25 MG tablet Take 1 tablet (25 mg total) by mouth 2 (two) times daily. (Patient not taking: Reported on 07/26/2019) 120 tablet 3   No current facility-administered medications on file prior to visit.     Allergies:   Allergies  Allergen Reactions   Lipitor [Atorvastatin Calcium] Swelling    throat   Tramadol Nausea Only    Physical Exam  Today's Vitals   09/02/19 1427  BP: (!) 135/91  Pulse: 95  Temp: 98.4 F (36.9 C)  TempSrc: Oral  Weight: 125 lb 3.2 oz (56.8 kg)  Height: 5' (1.524 m)   Body mass index is 24.45 kg/m.   General: well developed, well nourished pleasant middle-aged Hispanic lady, seated, in no evident distress Head: head normocephalic and atraumatic.   Neck: supple with no carotid or supraclavicular bruits Cardiovascular: regular rate and rhythm, no murmurs Musculoskeletal: no deformity Skin:  no rash/petichiae Vascular:  Normal pulses all extremities  Neurologic Exam Mental Status: awake and fully alert. Oriented to place and time.  Recent and remote memory intact. Attention span, concentration and fund of knowledge appropriate. Mood and affect appropriate.  Cranial Nerves: Pupils equal, briskly reactive to light.Fundi show sharp disc margins. Extraocular movements full without nystagmus. Visual fields full to confrontation. Hearing intact. Facial sensation intact. Face, tongue, palate moves normally and symmetrically.  Motor: Normal bulk and tone. Normal strength in all tested extremity muscles. Sensory.: intact to touch , pinprick , position and vibratory sensation.  Coordination: Rapid alternating movements normal in all extremities. Finger-to-nose and heel-to-shin performed accurately bilaterally. Gait and Station: Arises from chair without difficulty. Stance is normal. Gait demonstrates normal stride length and balance . Able to heel, toe and tandem walk without difficulty.  Reflexes: 1+ and symmetric. Toes downgoing.      ASSESSMENT:  Carla [name] is a 48 year old Hispanic lady with with PMH of cryptogenic stroke 2016, TIA, HDL and HTN and worsening  migraines.  Unable to tolerate Topamax.  Migraine frequency currently 2 times weekly.    PLAN:  -Patient hesitant on initiating preventative migraine medication due to sensitivity to medications and fear of side effects.  She was agreeable to trial Ubrevly 50 mg tablets.  She was provided with samples and will call office if beneficial.  If unable to tolerate or no benefit, consider referral to Dr. Lucia Gaskins for further migraine management -Continue aspirin 81 mg and Crestor for secondary stroke prevention -discussion regarding possibly changing statin but patient declines and would like to follow-up with PCP due to reports of difficulty tolerating current statin -Continue to follow with PCP for HTN and HLD management and secondary stroke risk factor management -Continue stay active and maintain a healthy diet  Follow-up in 4 months or call earlier if needed   Greater than  50% time during this 25-minute visit was spent on counseling  and coordination of care about her migraine headaches, remote stroke and TIA, and answering questions with assistance of interpreter  Ihor Austin, [facility]  [facility] [address] [place], [place] [address]  Phone [phone] Fax [phone] Note: This document was prepared with digital dictation and possible smart phrase technology. Any transcriptional errors that result from this process are unintentional.

## 2019-09-03 NOTE — Progress Notes (Signed)
I agree with the above plan 

## 2019-09-23 ENCOUNTER — Ambulatory Visit: Payer: Self-pay | Admitting: Adult Health

## 2019-10-01 ENCOUNTER — Other Ambulatory Visit: Payer: Self-pay

## 2019-10-01 ENCOUNTER — Ambulatory Visit (INDEPENDENT_AMBULATORY_CARE_PROVIDER_SITE_OTHER): Payer: Medicaid Other | Admitting: Physician Assistant

## 2019-10-01 ENCOUNTER — Ambulatory Visit (INDEPENDENT_AMBULATORY_CARE_PROVIDER_SITE_OTHER): Payer: Medicaid Other

## 2019-10-01 ENCOUNTER — Other Ambulatory Visit: Payer: Medicaid Other

## 2019-10-01 ENCOUNTER — Encounter: Payer: Self-pay | Admitting: Physician Assistant

## 2019-10-01 VITALS — BP 140/90 | HR 88 | Temp 98.7°F | Ht 60.0 in | Wt 124.2 lb

## 2019-10-01 DIAGNOSIS — R079 Chest pain, unspecified: Secondary | ICD-10-CM

## 2019-10-01 DIAGNOSIS — R1031 Right lower quadrant pain: Secondary | ICD-10-CM

## 2019-10-01 DIAGNOSIS — I10 Essential (primary) hypertension: Secondary | ICD-10-CM

## 2019-10-01 DIAGNOSIS — K625 Hemorrhage of anus and rectum: Secondary | ICD-10-CM

## 2019-10-01 LAB — CBC WITH DIFFERENTIAL/PLATELET
Basophils Absolute: 0 10*3/uL (ref 0.0–0.1)
Basophils Relative: 0.5 % (ref 0.0–3.0)
Eosinophils Absolute: 0.1 10*3/uL (ref 0.0–0.7)
Eosinophils Relative: 1.3 % (ref 0.0–5.0)
HCT: 41.1 % (ref 36.0–46.0)
Hemoglobin: 13.7 g/dL (ref 12.0–15.0)
Lymphocytes Relative: 32 % (ref 12.0–46.0)
Lymphs Abs: 1.7 10*3/uL (ref 0.7–4.0)
MCHC: 33.2 g/dL (ref 30.0–36.0)
MCV: 91 fl (ref 78.0–100.0)
Monocytes Absolute: 0.5 10*3/uL (ref 0.1–1.0)
Monocytes Relative: 8.5 % (ref 3.0–12.0)
Neutro Abs: 3.1 10*3/uL (ref 1.4–7.7)
Neutrophils Relative %: 57.7 % (ref 43.0–77.0)
Platelets: 300 10*3/uL (ref 150.0–400.0)
RBC: 4.52 Mil/uL (ref 3.87–5.11)
RDW: 13.9 % (ref 11.5–15.5)
WBC: 5.4 10*3/uL (ref 4.0–10.5)

## 2019-10-01 LAB — COMPREHENSIVE METABOLIC PANEL
ALT: 16 U/L (ref 0–35)
AST: 15 U/L (ref 0–37)
Albumin: 4.4 g/dL (ref 3.5–5.2)
Alkaline Phosphatase: 62 U/L (ref 39–117)
BUN: 12 mg/dL (ref 6–23)
CO2: 30 mEq/L (ref 19–32)
Calcium: 9.3 mg/dL (ref 8.4–10.5)
Chloride: 105 mEq/L (ref 96–112)
Creatinine, Ser: 0.73 mg/dL (ref 0.40–1.20)
GFR: 84.76 mL/min (ref >60.00)
Glucose, Bld: 82 mg/dL (ref 70–99)
Potassium: 3.9 mEq/L (ref 3.5–5.1)
Sodium: 141 mEq/L (ref 135–145)
Total Bilirubin: 0.9 mg/dL (ref 0.2–1.2)
Total Protein: 7.1 g/dL (ref 6.0–8.3)

## 2019-10-01 LAB — D-DIMER, QUANTITATIVE: D-Dimer, Quant: <0.19 mcg/mL FEU (ref <0.50)

## 2019-10-01 MED ORDER — METOPROLOL TARTRATE 25 MG PO TABS: 12.5000 mg | ORAL_TABLET | Freq: Two times a day (BID) | ORAL | 0 refills | Status: DC

## 2019-10-01 NOTE — Patient Instructions (Signed)
It was great to see you!  Change to Metoprolol 12.5 mg (HALF A TABLET) twice a day.  Please go to [facility] for chest xray [place], [place], [place] [address].  If your abdominal pain worsens, please go to the ER.  I will be in touch with lab results. Please work on having more regular bowel movements, may trial Miralax.  Take care,  [name] PA-C

## 2019-10-01 NOTE — Progress Notes (Signed)
Carla [name] is a 48 y.o. female is here to discuss: Medication  I acted as a Education administrator for [employer], PA-C Anselmo Pickler, LPN  History of Present Illness:   Chief Complaint  Patient presents with  . Left Lung pain  . RLQ pain   Patient presents today with in person interpreter.  HPI   Lung and RLQ pain Pt c/o left lung pain x 1.5 months. Has pain off and on, does have pain on deep inspiration and when she walks has little pain. Pt was Positive for Covid-19 on 8/27. Had a second test that was negative.  Pain radiates from the back of her left lung to the front of her rlq. Pain is getting worse with time, has increased in the past week. Pain maximum is 8/10. Gets chest tightness and pain. Does get SOB when she is "sitting and forcing her breath." Feels better when walking.  Denies lower leg swelling.  Does have a history of stroke.  RLQ pain Pt is c/o RLQ pain radiating to her back x 4-5 days. Has not taken anything for pain. Burning pain inside. Denies diarrhea. She is having constipation. Last BM was today. Has had rectal bleeding, reports that it was just with wiping on the toilet paper. No painful urination.  Denies any blood clots.  She does have a history of ovarian cysts, specifically on the left.  HTN Currently prescribed Lopressor 50 mg twice daily but she is only taking Lopressor 50 mg daily.. At home blood pressure readings are: 136/?. Patient denies chest pain, SOB, blurred vision, dizziness, unusual headaches, lower leg swelling. Denies excessive caffeine intake, stimulant usage, excessive alcohol intake, or increase in salt consumption.    Health Maintenance Due  Topic Date Due  . TETANUS/TDAP  10/28/1989  . PAP SMEAR-Modifier  04/09/2019  . INFLUENZA VACCINE  06/26/2019    Past Medical History:  Diagnosis Date  . Depression   . Frequent headaches   . 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 Hyperlipidemia   . Hypertension   . Stroke [facility])    09/2015     Social  History   Socioeconomic History  . Marital status: Married    Spouse name: Not on file  . Number of children: Not on file  . Years of education: Not on file  . Highest education level: Not on file  Occupational History  . Occupation: Furniture conservator/restorer  Social Needs  . Financial resource strain: Not on file  . Food insecurity    Worry: Not on file    Inability: Not on file  . Transportation needs    Medical: Not on file    Non-medical: Not on file  Tobacco Use  . Smoking status: Never Smoker  . Smokeless tobacco: Never Used  Substance and Sexual Activity  . Alcohol use: No  . Drug use: No  . Sexual activity: Not Currently    Birth control/protection: None  Lifestyle  . Physical activity    Days per week: Not on file    Minutes per session: Not on file  . Stress: Not on file  Relationships  . Social Herbalist on phone: Not on file    Gets together: Not on file    Attends religious service: Not on file    Active member of club or organization: Not on file    Attends meetings of clubs or organizations: Not on file    Relationship status: Not on file  . Intimate partner  violence    Fear of current or ex partner: Not on file    Emotionally abused: Not on file    Physically abused: Not on file    Forced sexual activity: Not on file  Other Topics Concern  . Not on file  Social History Narrative   From [place]   Lived in [place] for 20 years, and been in [place] for 6 years   Husband and son, 59   Works: clean homes    Past Surgical History:  Procedure Laterality Date  . RIGHT ARM SURGERY    . TEE WITHOUT CARDIOVERSION N/A 10/13/2015   Procedure: TRANSESOPHAGEAL ECHOCARDIOGRAM (TEE);  Surgeon: Lelon Perla, MD;  Location: [facility] ENDOSCOPY;  Service: Cardiovascular;  Laterality: N/A;    Family History  Problem Relation Age of Onset  . Leukemia Mother   . Colon cancer Brother   . Asthma Neg Hx   . Cancer Neg Hx   . Diabetes Neg Hx   . Hyperlipidemia Neg Hx    . Heart failure Neg Hx   . Hypertension Neg Hx   . Migraines Neg Hx   . Rashes / Skin problems Neg Hx   . Seizures Neg Hx   . Stroke Neg Hx   . Thyroid disease Neg Hx     PMHx, SurgHx, SocialHx, FamHx, Medications, and Allergies were reviewed in the Visit Navigator and updated as appropriate.   Patient Active Problem List   Diagnosis Date Noted  . Essential hypertension 07/05/2019  . Left knee pain 01/08/2018  . 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 Premature ventricular contractions 03/25/2016  . Hyperlipidemia 03/25/2016  . Benign paroxysmal positional vertigo 03/11/2016  . Neck pain on right side 02/19/2016  . Cryptogenic stroke ([place]) 12/28/2015  . Multiple hemangiomas   . Cerebral infarction due to unspecified mechanism   . 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 Migraines 10/11/2015    Social History   Tobacco Use  . Smoking status: Never Smoker  . Smokeless tobacco: Never Used  Substance Use Topics  . Alcohol use: No  . Drug use: No    Current Medications and Allergies:    Current Outpatient Medications:  .  aspirin 81 MG tablet, Take 1 tablet (81 mg total) by mouth daily., Disp: 30 tablet, Rfl: 1 .  cyclobenzaprine (FLEXERIL) 10 MG tablet, Take 1 tablet (10 mg total) by mouth 2 (two) times daily as needed for muscle spasms., Disp: 20 tablet, Rfl: 0 .  Ubrogepant (UBRELVY) 50 MG TABS, Take 50 mg by mouth daily as needed (migraine onset). 50 mg tablet at migraine onset; may repeat x1 if needed after 2 hrs; Maximum dose: 200 mg per 24 hours, Disp: 4 tablet, Rfl: 0 .  atenolol (TENORMIN) 50 MG tablet, Take 50 mg by mouth daily., Disp: , Rfl:  .  BYSTOLIC 5 MG tablet, Take 5 mg by mouth daily., Disp: , Rfl:  .  metoprolol tartrate (LOPRESSOR) 25 MG tablet, Take 0.5 tablets (12.5 mg total) by mouth 2 (two) times daily., Disp: 90 tablet, Rfl: 0   Allergies  Allergen Reactions  . Lipitor [Atorvastatin Calcium] Swelling    throat  . Tramadol Nausea Only    Review of  Systems   ROS  Negative unless otherwise specified per HPI.   Vitals:   Vitals:   10/01/19 0922  BP: 140/90  Pulse: 88  Temp: 98.7 F (37.1 C)  TempSrc: Temporal  SpO2: 97%  Weight: 124 lb 4 oz (56.4 kg)  Height:  5' (1.524 m)     Body mass index is 24.27 kg/m.   Physical Exam:    Physical Exam Vitals signs and nursing note reviewed.  Constitutional:      General: She is not in acute distress.    Appearance: She is well-developed. She is not ill-appearing or toxic-appearing.  Cardiovascular:     Rate and Rhythm: Normal rate and regular rhythm.     Pulses: Normal pulses.     Heart sounds: Normal heart sounds, S1 normal and S2 normal.     Comments: No LE edema Pulmonary:     Effort: Pulmonary effort is normal.     Breath sounds: Normal breath sounds.  Abdominal:     General: Abdomen is flat. Bowel sounds are normal.     Palpations: Abdomen is soft.     Tenderness: There is abdominal tenderness (very mild) in the right lower quadrant. There is no right CVA tenderness, left CVA tenderness, guarding or rebound. Negative signs include McBurney's sign.  Genitourinary:    Rectum: Guaiac result negative.     Comments: No tenderness on exam, no external hemorrhoids apparent Skin:    General: Skin is warm and dry.  Neurological:     Mental Status: She is alert.     GCS: GCS eye subscore is 4. GCS verbal subscore is 5. GCS motor subscore is 6.  Psychiatric:        Speech: Speech normal.        Behavior: Behavior normal. Behavior is cooperative.      Assessment and Plan:    Carla [name] was seen today for left lung pain and rlq pain.  Diagnoses and all orders for this visit:  Chest pain, unspecified type Unclear etiology.  Because she has a history of stroke and she did have Covid, I do want her to rule out blood clot.  I did check a D-dimer stat and and waiting for this result.  I have also gotten a chest x-ray to see if there is any abnormality in her left lung as she  sometimes have pain with deep inspiration.  No other red flags on my exam today.  She was instructed to go to the ER if she has any worsening symptoms. -     DG Chest 2 View; Future -     D-Dimer, Quantitative  Abdominal pain, RLQ No evidence of acute abdomen, and patient is in no apparent distress today.  I do suspect that she has some constipation.  Cannot rule out also potential ovarian cyst.  Worsening precautions advised, recommend that she trial MiraLAX to see if this helps her symptoms, any worsening severe pain, needs to go to the emergency room. Follow-up with [place] in 1 month, sooner if concerns.  We will also check CBC and CMP. -     CBC with Differential/Platelet -     Comprehensive metabolic panel  Rectal bleeding Unclear etiology of this. -     CBC with Differential/Platelet -     Comprehensive metabolic panel  Essential hypertension She feels as though her Lopressor dosage is too high, I did recommend that she start taking it twice a day, so we will trial the 12.5 mg twice daily for 2 days and have her follow-up with me in 1 month.  Other orders -     metoprolol tartrate (LOPRESSOR) 25 MG tablet; Take 0.5 tablets (12.5 mg total) by mouth 2 (two) times daily.   . Reviewed expectations re: course of current medical issues. Marland Kitchen  Discussed self-management of symptoms. . Outlined signs and symptoms indicating need for more acute intervention. . Patient verbalized understanding and all questions were answered. . See orders for this visit as documented in the electronic medical record. . Patient received an After Visit Summary.  CMA or LPN served as scribe during this visit. History, Physical, and Plan performed by medical provider. The above documentation has been reviewed and is accurate and complete.  I spent 45 minutes with this patient, greater than 50% was face-to-face time counseling regarding the above diagnoses.   [name], PA-C [place] 10/01/2019   Follow-up: No follow-ups on file.

## 2019-10-14 ENCOUNTER — Ambulatory Visit: Payer: Self-pay | Admitting: *Deleted

## 2019-10-14 NOTE — Telephone Encounter (Signed)
Having headaches and body aches at times, over the last week. Experiences fatigue also feels her b/p medications are causing it. Feels her B/P medications are causing this. Reports her readings have been high but unable to provide them at this time. Denies CP/SOB/Dizziness.Could not provide any B/P readings at this time. Reviewed urgent symptoms requiring immediate evaluation. Stated she understood. Obtained okay for appointment 11/20 Friday @9 :40a with [name], PA as virtual visit per [facility]. Informed patient of appointment, would receive a call from office 10-15 minutes before appointment time.  Reason for Disposition . [1] Taking BP medications AND [2] feels is having side effects (e.g., impotence, cough, dizzy upon standing)  Protocols used: HIGH BLOOD PRESSURE-A-AH

## 2019-10-14 NOTE — Telephone Encounter (Signed)
   Answer Assessment - Initial Assessment Questions 1. BLOOD PRESSURE: "What is the blood pressure?" "Did you take at least two measurements 5 minutes apart?"     2 days ago last check, she cannot remember and does not have time to take. 2. ONSET: "When did you take your blood pressure?"     Last time was 2 days ago. 3. HOW: "How did you obtain the blood pressure?" (e.g., visiting nurse, automatic home BP monitor)     Home monitor 4. HISTORY: "Do you have a history of high blood pressure?"     yes 5. MEDICATIONS: "Are you taking any medications for blood pressure?" "Have you missed any doses recently?"     yes 6. OTHER SYMPTOMS: "Do you have any symptoms?" (e.g., headache, chest pain, blurred vision, difficulty breathing, weakness)     Headache and body aches 7. PREGNANCY: "Is there any chance you are pregnant?" "When was your last menstrual period?"    no  Protocols used: HIGH BLOOD PRESSURE-A-AH

## 2019-10-15 ENCOUNTER — Telehealth: Payer: Self-pay | Admitting: *Deleted

## 2019-10-15 ENCOUNTER — Ambulatory Visit: Payer: Medicaid Other | Admitting: Physician Assistant

## 2019-10-15 NOTE — Telephone Encounter (Signed)
Spoke to pt to go over record for virtual visit this morning. Pt said she is not having headache today. Has been having headaches off and on and muscle aches. Pt asked about cholesterol medication. Told her Aldona Bar stopped that and went over blood pressure medication to be taking. Lopressor 25 mg half a tablet twice a day. Pt verbalized understanding Asked pt what blood pressure was? Pt said she checked 3 days ago was 132/? Could not remember. . Asked pt if she wanted to talk to [facility] today? Pt said no she is feeling better. Told pt okay need to continue to monitor blood pressure if about 140/90 or headaches continue to please call. Will cancel appt. Pt verbalized understanding.

## 2019-10-15 NOTE — Progress Notes (Signed)
Appointment was cancelled. No charge.

## 2019-11-04 ENCOUNTER — Ambulatory Visit: Payer: Self-pay | Admitting: Adult Health

## 2019-11-04 ENCOUNTER — Encounter: Payer: Self-pay | Admitting: Adult Health

## 2019-11-04 ENCOUNTER — Other Ambulatory Visit: Payer: Self-pay

## 2019-11-04 VITALS — BP 132/96 | HR 85 | Temp 98.8°F | Ht 61.0 in | Wt 125.0 lb

## 2019-11-04 DIAGNOSIS — Z8673 Personal history of transient ischemic attack (TIA), and cerebral infarction without residual deficits: Secondary | ICD-10-CM

## 2019-11-04 DIAGNOSIS — E785 Hyperlipidemia, unspecified: Secondary | ICD-10-CM

## 2019-11-04 DIAGNOSIS — I10 Essential (primary) hypertension: Secondary | ICD-10-CM

## 2019-11-04 DIAGNOSIS — R42 Dizziness and giddiness: Secondary | ICD-10-CM

## 2019-11-04 MED ORDER — MECLIZINE HCL 25 MG PO TABS: 25.0000 mg | ORAL_TABLET | Freq: Three times a day (TID) | ORAL | 0 refills | Status: DC

## 2019-11-04 NOTE — Patient Instructions (Signed)
Recommend obtaining imaging of your head with MRI w/wo contrast to look for any possible causes of your vertigo symptoms  Trial use of meclizine to decrease vertigo type symptoms  Will recommend initiation of vestibular rehab if symptoms continue  Recommend evaluation by ophthalmology (eye doctor) in regards to your blurred vision concerns  Continue aspirin 81 mg daily  and pravastatin for secondary stroke prevention  Continue to follow up with PCP regarding cholesterol and blood pressure management   Continue to monitor blood pressure at home  Maintain strict control of hypertension with blood pressure goal below 130/90, diabetes with hemoglobin A1c goal below 6.5% and cholesterol with LDL cholesterol (bad cholesterol) goal below 70 mg/dL. I also advised the patient to eat a healthy diet with plenty of whole grains, cereals, fruits and vegetables, exercise regularly and maintain ideal body weight.  Followup in the future with me in 2 months or call earlier if needed       Thank you for coming to see [place] at [facility]. I hope we have been able to provide you high quality care today.  You may receive a patient satisfaction survey over the next few weeks. We would appreciate your feedback and comments so that we may continue to improve ourselves and the health of our patients.

## 2019-11-04 NOTE — Progress Notes (Signed)
[name] [address] [place]. [place] [address] [phone]       OFFICE CONSULT NOTE  Carla. Carla [name] Date of Birth:  [birth year] Medical Record Number:  [id number]   Referring MD:  Jarold Motto, PA-c Reason for Referral:  headache  Chief Complaint  Carla [name] presents with  . Follow-up    room 9, with translator.  Pt believes that she had a stroke a week ago. Didnt go to the ED.    HPI: 11/04/19    HISTORY SUMMARY: Initial visit 09/29/2018 Dr. Pearlean Brownie:  Carla [name] is a pleasant 48 year Hispanic lady originally from [place] who was seen today for evaluation for headaches.  She is accompanied by her friend as well as Spanish language interpreter who provides translation for this visit.  History is obtained from them, review of electronic medical records and have personally reviewed imaging films.  Carla [name] has been complaining of increasing headaches for the last 2 months.  She describes this headache as being right temporal and frontal mostly throbbing in nature though fluctuating in severity from 6/10 to 10/10.  Headache is accompanied by nausea vomiting light and sound sensitivity.  She has to stop her activities and lie down and sleeping as well as taking Tylenol seems to help.  Headaches are accompanied by a single vision spots and scintillations and blurred vision.  She denies any speech difficulties, vertigo, diplopia, gait or balance difficulties with headaches.  Headaches usually last about 30 to 40 minutes.  Headaches of late have been occurring about 2-3 times per week.  She does give a remote history of infrequent headaches which sound like migraine headaches and was similar but were mostly perimenstrual and not bad.  The Carla [name] also states in September 2019 she had a episode of speech difficulties as well as dizziness.  She was seen by primary care physician who states that the Carla [name] had a headache as well as feeling of dizziness and some speech  difficulties.  This happened following a stressful event.  An outpatient MRI was ordered which was done on 08/05/2018 which I personally reviewed shows a few nonspecific white matter hyperintensities but no acute abnormality or definite stroke.  Carla [name] states her symptoms improved within a few days but the headaches have persisted since then.  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 MRA of the brain showed slight diminished peripheral branches in the left MCA but no large vessel stenosis or occlusion. Carotid ultrasound showed no significant extracranial stenosis. Transthoracic echo showed normal ejection fraction. Transesophageal echocardiogram showed no cardiac source of embolism or PFO. Antiphospholipid antibodies and lupus anticoagulant were both negative. Lipid profile showed elevated total cholesterol 207 and LDL 139 mg percent. Hemoglobin A1c was 5.6. Carla [name] was started on aspirin as well as protocol. She states her speech difficulty and right-sided weakness completely improved.  She was placed on aspirin and pravastatin which she actually did well on but for unclear reason she discontinued both medications.  She is currently on Lipitor 40 which was started a few months ago by primary physician but she is complaining of dizziness on this medication and wants to change it or stop it.  She states she had slight balance difficulties from her previous stroke but had recovered well from that.  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  She states her blood pressure is under good control.  Last lipid profile checked in August 2019 showed LDL of 120 following which Lipitor was added.  I had recommended doing a transcranial Doppler bubble study at last visit with me in 2017 but for some  unclear reason this was never done.  Update 12/30/2018: Carla [name] is being seen today for follow-up visit for migraines and is accompanied by interpreter as Carla [name] is primarily Spanish-speaking.  Upon initial visit with Dr. Pearlean Brownie on 09/29/2018 (visit info below), headaches were not frequent enough to justify initiating migraine prophylaxis.  She did call back on 12/02/2018 reporting daily migraines and therefore initiated Topamax 25 mg twice daily.  She took this medication for 4 days but discontinued as medication was making her too fatigued.  She consider taking 1 tab at night only but wanted to speak with [place] in regards to this first.  She was evaluated in the ED on 12/19/2018 and returned on 12/23/2018 due to vaginal bleeding.  Apparently, all medications discontinued after 12/23/2018 visit and was started on Provera for 10-day course with recommending follow-up with OB/GYN for further evaluation.  She reports today that her headaches have improved and not experience a headache since ED admission on 12/23/2018.  She does have an appointment scheduled at [facility] on 01/12/2019 for further evaluation.  She denies any continued vaginal bleeding at this time.  Blood pressure today satisfactory 130/68. It was recommended after prior visit to undergo transcranial Doppler with bubble study to assess for possible PFO.  This was obtained on 10/14/2018 and was negative for PFO.  Update 06/21/2019 : She returns for follow-up after last visit 5 months ago.  She is accompanied by a [place] language interpreter who was present throughout the visit and interprets for her.  Carla [name] states her main complaint today is that her migraine headaches seem to have come back.  In the last 3 months she has had increased frequency of headaches which occur 3 to 4 days/week.  The headache starts off as a throbbing pain around her right temple as well as periorbital region becomes unbearable 10/10 in severity.  She has relied on.  Light  and sound do worsen the headache.  She does not throw up or feel nauseous.  She does not have visual symptoms with the headaches.  Headache may last for most of the day.  She is unable to identify specific triggers for headaches but does feel tightness in the back of her head and neck muscles.  She has not tried triptans or prescription migraine specific medications she has tried taking Tylenol and ibuprofen without relief.  She has been taking naproxen which seems to work better.  She has been taking them 3 to 4 days a week.  She was previously on Topamax but when she started having dysfunctional uterine bleeding all the medications were stopped including Topamax.  She is unable to tell me if she had any bad side effects on it.  Carla [name] recently changed her primary care physician and a new physician assistant discontinued protocol for unclear reason.  She remains on aspirin which is tolerating well without bruising or bleeding.  Her blood pressure is under good control and today it is 142/83.  She has had no recurrent stroke or TIA symptoms.  Update 09/02/2019: Carla [name] is being seen today for 29-month follow-up regarding migraine headaches accompanied by interpreter.  After prior visit, Topamax 25 mg daily was restarted  due to worsening migraines as well as Relpax for emergent relief.  She did not tolerate Topamax as she reports this caused weight loss and short-term memory loss.  She was unable to afford Relpax.  She has recently been experiencing migraines 2 times weekly and typically lasts for 1 hour total but during this time, migraines are debilitating and continues to be located right temporal area.  She has not trialed any other type of migraine medication.  Recent diagnosis of COVID-19 in 06/2019 and feels as though she has not completely recovered mentally and physically.  After prior visit, lipid panel obtained and recommended restart pravastatin but due to side effects, PCP initiated Crestor.  She  reports difficulty tolerating Crestor but plans on following up with PCP for further monitoring and management.  Blood pressure today 135/91.  Update 11/04/2019: Carla [name] is a 48 year old female who is being seen today by Carla [name] request due to concerns of balance difficulties concern for stroke.  She is accompanied today by a interpreter.  She did not seek medical treatment at onset.  Approximately 1.5 weeks ago, she states she started experiencing intermittent balance impairment with a swaying type sensation and room spinning.  Symptoms severe where she will have to hold onto something and close her eyes.  She states she experienced same type of symptoms with prior stroke.  She denies associated headache, neck pain, speech difficulty, visual changes, weakness or numbness/tingling.  She denies increased stress or anxiety/depression.  She states standing straight up or laying flat or with head movement induces the symptoms.  She denies currently feeling symptoms at today's visit.  She does endorse recently stopping her metoprolol 3 days ago due to increased stress and irritability symptoms.  She does monitor blood pressure at home and SBP 130s.  She is unable to endorse diastolic pressures or heart rates.  Blood pressure today satisfactory at 132/96.  He does endorse continuation of aspirin 81 mg daily and recently started on pravastatin approximately 3 weeks ago by PCP.      ROS:   14 system review of systems is positive for migraines intermittent vertigo and all other systems negative  PMH:  Past Medical History:  Diagnosis Date  . Depression   . Frequent headaches   . 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 Hyperlipidemia   . Hypertension   . Stroke [facility])    09/2015    Social History:  Social History   Socioeconomic History  . Marital status: Married    Spouse name: Not on file  . Number of children: Not on file  . Years of education: Not on file  . Highest education level: Not on file    Occupational History  . Occupation: Financial trader  Tobacco Use  . Smoking status: Never Smoker  . Smokeless tobacco: Never Used  Substance and Sexual Activity  . Alcohol use: No  . Drug use: No  . Sexual activity: Not Currently    Birth control/protection: None  Other Topics Concern  . Not on file  Social History Narrative   From [place]   Lived in [place] for 20 years, and been in [place] for 6 years   Husband and son, 33   Works: clean homes   Social Determinants of Corporate investment banker Strain:   . Difficulty of Paying Living Expenses: Not on file  Food Insecurity:   . Worried About Programme researcher, broadcasting/film/video in the Last Year: Not on file  . Ran Out of Food  in the Last Year: Not on file  Transportation Needs:   . Lack of Transportation (Medical): Not on file  . Lack of Transportation (Non-Medical): Not on file  Physical Activity:   . Days of Exercise per Week: Not on file  . Minutes of Exercise per Session: Not on file  Stress:   . Feeling of Stress : Not on file  Social Connections:   . Frequency of Communication with Friends and Family: Not on file  . Frequency of Social Gatherings with Friends and Family: Not on file  . Attends Religious Services: Not on file  . Active Member of Clubs or Organizations: Not on file  . Attends Banker Meetings: Not on file  . Marital Status: Not on file  Intimate Partner Violence:   . Fear of Current or Ex-Partner: Not on file  . Emotionally Abused: Not on file  . Physically Abused: Not on file  . Sexually Abused: Not on file    Medications:   Current Outpatient Medications on File Prior to Visit  Medication Sig Dispense Refill  . aspirin 81 MG tablet Take 1 tablet (81 mg total) by mouth daily. 30 tablet 1  . metoprolol tartrate (LOPRESSOR) 25 MG tablet Take 0.5 tablets (12.5 mg total) by mouth 2 (two) times daily. 90 tablet 0  . pravastatin (PRAVACHOL) 10 MG tablet Take 10 mg by mouth daily.     No current  facility-administered medications on file prior to visit.    Allergies:   Allergies  Allergen Reactions  . Lipitor [Atorvastatin Calcium] Swelling    throat  . Tramadol Nausea Only    Physical Exam  Today's Vitals   11/04/19 1542  BP: (!) 132/96  Pulse: 85  Temp: 98.8 F (37.1 C)  Weight: 125 lb (56.7 kg)  Height: 5\' 1"  (1.549 m)   Body mass index is 23.62 kg/m.   General: well developed, well nourished pleasant middle-aged Hispanic lady, seated, mildly anxious Head: head normocephalic and atraumatic.   Neck: supple with no carotid or supraclavicular bruits Cardiovascular: regular rate and rhythm, no murmurs Musculoskeletal: no deformity Skin:  no rash/petichiae Vascular:  Normal pulses all extremities  Neurologic Exam Mental Status: awake and fully alert. Oriented to place and time. Recent and remote memory intact. Attention span, concentration and fund of knowledge appropriate. Mood and affect anxious and tearful.  Cranial Nerves: Pupils equal, briskly reactive to light. Fundi exam show sharp disc margin of right eye and unable to view disc of left eye due to appearance of congested vessels.  Extraocular movements full without nystagmus. Visual fields full to confrontation with subjective blurriness (chronic). Hearing intact. Facial sensation intact. Face, tongue, palate moves normally and symmetrically.  Extension of head and side to side did not produce symptoms Motor: Normal bulk and tone. Normal strength in all tested extremity muscles. Sensory.: intact to touch , pinprick , position and vibratory sensation.  Coordination: Rapid alternating movements normal in all extremities. Finger-to-nose and heel-to-shin performed accurately bilaterally. Gait and Station: Arises from chair without difficulty. Stance is normal. Gait demonstrates normal stride length and balance . Able to heel, toe and tandem walk without difficulty.  Romberg difficulty assessing due to poor  participation and increased fear with possible increase of symptoms but denied vertigo or dizziness Reflexes: 1+ and symmetric. Toes downgoing.      ASSESSMENT:  Carla [name] is a 48 year old Hispanic lady with with PMH of cryptogenic stroke 2016, TIA, HDL, HTN and migraines.  She requested appointment  today due to 1.5-week onset of intermittent vertigo worsened with position and head movements.  Completely asymptomatic at today's visit    PLAN:  Reviewed case with Dr. Lucia Gaskins who personally assessed Carla [name].  As Carla [name] is asymptomatic at today's visit, no indication at this time to have Carla [name] proceed to ED but she was advised if symptoms return or worsen, to call 911 immediately.  Will obtain MRI w/wo head to rule out possible abnormalities along with evaluation of cranial nerves.  Also short course of meclizine 25 mg 3 times daily for management of vertigo.  She would likely benefit from vestibular rehab for possible BPPV but unfortunately Carla [name] does not have insurance at this time.  She was provided with information regarding [place] financial assistance program and was advised to complete paperwork in order to undergo needed treatment.  There can also be underlying psychiatric component contributing to current symptoms and advised to follow-up with PCP -Continue aspirin 81 mg and pravastatin for secondary stroke prevention -she questions appropriate dosage and medications for stroke prevention stating "I have been taking medication as prescribed but I still had a stroke" speaking of recent vertigo onset.  Advised her that it has not been determined at this time if symptoms related to new stroke and recommended continuation of current therapy until imaging completed -she verbalized understanding -Continue to follow with PCP for HTN and HLD management and secondary stroke risk factor management -May benefit from psychiatric consult -Continue stay active and maintain a healthy  diet  Follow-up in 2 months or call earlier if needed  Unfortunately, Carla [name] left prior to reviewing testing recommendations and use of medications and possible therapy.  My chart message will be sent for her to review  Greater than 50% time during this 25-minute visit was spent on counseling and coordination of care about new onset of vertigo likely peripheral but need to rule out acute stroke, discussion regarding obtaining imaging and importance of applying for financial assistance, and answering questions with assistance of interpreter  Ihor Austin, [facility]  [facility] [address] [place], [place] [address]  Phone [phone] Fax [phone] Note: This document was prepared with digital dictation and possible smart phrase technology. Any transcriptional errors that result from this process are unintentional.

## 2019-11-04 NOTE — Progress Notes (Signed)
I agree with the above plan 

## 2019-11-06 ENCOUNTER — Emergency Department (HOSPITAL_COMMUNITY): Payer: Self-pay

## 2019-11-06 ENCOUNTER — Emergency Department (HOSPITAL_COMMUNITY)
Admission: EM | Admit: 2019-11-06 | Discharge: 2019-11-06 | Disposition: A | Discharge status: Home / Self Care | Payer: Self-pay | Attending: Emergency Medicine | Admitting: Emergency Medicine

## 2019-11-06 ENCOUNTER — Encounter (HOSPITAL_COMMUNITY): Payer: Self-pay | Admitting: Emergency Medicine

## 2019-11-06 ENCOUNTER — Other Ambulatory Visit: Payer: Self-pay

## 2019-11-06 DIAGNOSIS — Z7982 Long term (current) use of aspirin: Secondary | ICD-10-CM | POA: Insufficient documentation

## 2019-11-06 DIAGNOSIS — I10 Essential (primary) hypertension: Secondary | ICD-10-CM | POA: Insufficient documentation

## 2019-11-06 DIAGNOSIS — Z8673 Personal history of transient ischemic attack (TIA), and cerebral infarction without residual deficits: Secondary | ICD-10-CM | POA: Insufficient documentation

## 2019-11-06 DIAGNOSIS — R519 Headache, unspecified: Secondary | ICD-10-CM | POA: Insufficient documentation

## 2019-11-06 DIAGNOSIS — Z79899 Other long term (current) drug therapy: Secondary | ICD-10-CM | POA: Insufficient documentation

## 2019-11-06 DIAGNOSIS — R42 Dizziness and giddiness: Secondary | ICD-10-CM | POA: Insufficient documentation

## 2019-11-06 LAB — COMPREHENSIVE METABOLIC PANEL
ALT: 23 U/L (ref 0–44)
AST: 23 U/L (ref 15–41)
Albumin: 3.9 g/dL (ref 3.5–5.0)
Alkaline Phosphatase: 62 U/L (ref 38–126)
Anion gap: 8 (ref 5–15)
BUN: 11 mg/dL (ref 6–20)
CO2: 27 mmol/L (ref 22–32)
Calcium: 9.3 mg/dL (ref 8.9–10.3)
Chloride: 105 mmol/L (ref 98–111)
Creatinine, Ser: 0.79 mg/dL (ref 0.44–1.00)
GFR calc Af Amer: >60 mL/min (ref >60)
GFR calc non Af Amer: >60 mL/min (ref >60)
Glucose, Bld: 109 mg/dL — ABNORMAL HIGH (ref 70–99)
Potassium: 3.9 mmol/L (ref 3.5–5.1)
Sodium: 140 mmol/L (ref 135–145)
Total Bilirubin: 1 mg/dL (ref 0.3–1.2)
Total Protein: 7.1 g/dL (ref 6.5–8.1)

## 2019-11-06 LAB — DIFFERENTIAL
Abs Immature Granulocytes: 0.02 10*3/uL (ref 0.00–0.07)
Basophils Absolute: 0 10*3/uL (ref 0.0–0.1)
Basophils Relative: 1 %
Eosinophils Absolute: 0.1 10*3/uL (ref 0.0–0.5)
Eosinophils Relative: 1 %
Immature Granulocytes: 0 %
Lymphocytes Relative: 24 %
Lymphs Abs: 1.9 10*3/uL (ref 0.7–4.0)
Monocytes Absolute: 0.4 10*3/uL (ref 0.1–1.0)
Monocytes Relative: 5 %
Neutro Abs: 5.5 10*3/uL (ref 1.7–7.7)
Neutrophils Relative %: 69 %

## 2019-11-06 LAB — I-STAT CHEM 8, ED
BUN: 12 mg/dL (ref 6–20)
Calcium, Ion: 1.25 mmol/L (ref 1.15–1.40)
Chloride: 103 mmol/L (ref 98–111)
Creatinine, Ser: 0.8 mg/dL (ref 0.44–1.00)
Glucose, Bld: 101 mg/dL — ABNORMAL HIGH (ref 70–99)
HCT: 40 % (ref 36.0–46.0)
Hemoglobin: 13.6 g/dL (ref 12.0–15.0)
Potassium: 3.9 mmol/L (ref 3.5–5.1)
Sodium: 142 mmol/L (ref 135–145)
TCO2: 30 mmol/L (ref 22–32)

## 2019-11-06 LAB — CBC
HCT: 42.3 % (ref 36.0–46.0)
Hemoglobin: 13.9 g/dL (ref 12.0–15.0)
MCH: 30 pg (ref 26.0–34.0)
MCHC: 32.9 g/dL (ref 30.0–36.0)
MCV: 91.4 fL (ref 80.0–100.0)
Platelets: 323 10*3/uL (ref 150–400)
RBC: 4.63 MIL/uL (ref 3.87–5.11)
RDW: 12.7 % (ref 11.5–15.5)
WBC: 8 10*3/uL (ref 4.0–10.5)
nRBC: 0 % (ref 0.0–0.2)

## 2019-11-06 LAB — PROTIME-INR
INR: 1 (ref 0.8–1.2)
Prothrombin Time: 13.3 seconds (ref 11.4–15.2)

## 2019-11-06 LAB — I-STAT BETA HCG BLOOD, ED (MC, WL, AP ONLY): I-stat hCG, quantitative: <5 m[IU]/mL (ref <5)

## 2019-11-06 LAB — APTT: aPTT: 29 seconds (ref 24–36)

## 2019-11-06 MED ORDER — SODIUM CHLORIDE 0.9% FLUSH: 3.0000 mL | Freq: Once | INTRAVENOUS | Status: DC

## 2019-11-06 MED ORDER — GADOBUTROL 1 MMOL/ML IV SOLN
5.0000 mL | Freq: Once | INTRAVENOUS | Status: AC | PRN
  Administered 2019-11-06: 5 mL via INTRAVENOUS

## 2019-11-06 NOTE — Discharge Instructions (Addendum)
You have been diagnosed today with dizziness, vertigo  At this time there does not appear to be the presence of an emergent medical condition, however there is always the potential for conditions to change. Please read and follow the below instructions.  Please return to the Emergency Department immediately for any new or worsening symptoms. Please be sure to follow up with your Primary Care Provider within one week regarding your visit today; please call their office to schedule an appointment even if you are feeling better for a follow-up visit. Please call your neurologist office tomorrow morning to schedule a follow-up appointment and to discuss the results of your testing today.  Your MRI today showed small bilateral white matter hyper intensities unchanged from 2019.  Discussed this with your neurologist at your next visit.  Get help right away if: You throw up (vomit) or have watery poop (diarrhea), and you cannot eat or drink anything. You have trouble: Talking. Walking. Swallowing. Using your arms, hands, or legs. You feel generally weak. You are not thinking clearly, or you have trouble forming sentences. A friend or family member may notice this. You have: Chest pain. Pain in your belly (abdomen). Shortness of breath. Sweating. Your vision changes. You are bleeding. You have a very bad headache. You have neck pain or a stiff neck. You have a fever. You have any new/concerning or worsening symptoms  Please read the additional information packets attached to your discharge summary.  Do not take your medicine if  develop an itchy rash, swelling in your mouth or lips, or difficulty breathing; call 911 and seek immediate emergency medical attention if this occurs.  Note: Portions of this text may have been transcribed using voice recognition software. Every effort was made to ensure accuracy; however, inadvertent computerized transcription errors may still be  present. ========== Below has been translated using Google translate.  Errors may be present.  Interpret with caution.   A continuacin se ha traducido con Microbiologist. Puede haber errores. Interprete con precaucin. ========== [employer] han diagnosticado mareos, vrtigo  En este momento no parece haber la presencia de una afeccin mdica emergente, sin embargo, siempre existe la posibilidad de que las afecciones [place]. [place] y [place] instrucciones a continuacin.  1. Regrese al Departamento de Emergencias de inmediato si tiene sntomas nuevos o que [place]. 2. Asegrese de hacer un seguimiento con su Proveedor de atencin primaria dentro de una semana con respecto a su visita de hoy; por favor llame a su oficina para programar una cita incluso si se siente mejor para una visita de seguimiento. 3. Llame al consultorio de su neurlogo maana por la maana para programar una cita de seguimiento y [employer] de sus pruebas de [place]. Su resonancia magntica de [employer] mostr pequeas hiperintensidades bilaterales de materia blanca sin cambios desde 2019. Discuti esto con su neurlogo en su prxima visita.  Obtenga ayuda de inmediato si: ? Vomita (vomita) o tiene caca aguada (diarrea) y no puede comer ni beber nada. ? Tienes problemas: o Hablar. o Caminando. o [place]. o Usar sus brazos, manos o piernas. ? Generalmente se siente dbil. ? No piensa con claridad o tiene problemas para formar oraciones. Un amigo o familiar puede notar esto. ? Tienes: o Dolor de pecho. o Dolor en su vientre (abdomen). o Dificultad para respirar. o sudoracin. ? Tu visin cambia. ? Ests sangrando. ? Tiene un dolor de Careers adviser. ? Tiene dolor de cuello o rigidez en el cuello. ?  Tienes fiebre. ? Tiene sntomas nuevos / preocupantes o que empeoran  Lea los paquetes de informacin adicional adjuntos a su resumen de alta.  No tome su medicamento si desarrolla un sarpullido con  picazn, hinchazn en su boca o labios, o dificultad para respirar; Llame al 911 y busque atencin mdica de emergencia inmediata si esto ocurre.  Nota: Es posible que algunas partes de este texto se hayan transcrito con un software de reconocimiento de voz. Se hizo todo lo posible para garantizar la precisin; sin embargo, an pueden estar presentes errores de transcripcin computarizados inadvertidos.

## 2019-11-06 NOTE — ED Notes (Signed)
Patient transported to MRI 

## 2019-11-06 NOTE — ED Notes (Signed)
Pt back from MRI 

## 2019-11-06 NOTE — ED Notes (Signed)
Pt is ambulated in her room. She denies any dizziness but states she has to take things slow.

## 2019-11-06 NOTE — ED Notes (Signed)
Patient verbalizes understanding of discharge instructions. Opportunity for questioning and answers were provided. Armband removed by staff, pt discharged from ED.  

## 2019-11-06 NOTE — ED Triage Notes (Signed)
Pt in with increased dizziness, balance issues and worsened HA since last night, but ongoing x 3 mo's. Hx of 4 CVA's, was seen by Neurologist 2 days ago and MRI was scheduled but not yet done. Went to her PCP this am and they sent her to ED for eval. Daughter states her speech and confusion is worsening x 3 mo's. Pt reports dizziness worse when turning head. MAE's equally, but gait unsteady during walks. CT and MRI to be ordered per [facility]

## 2019-11-06 NOTE — ED Provider Notes (Signed)
[facility] EMERGENCY DEPARTMENT Provider Note   CSN: [id number] Arrival date & time: 11/06/19  1332     History Chief Complaint  Patient presents with  . Dizziness  . Headache  . Gait Problem    Carla [name] is a 48 y.o. female presents today for concern of stroke.  Patient reports to me that she is concerned that she had a stroke 2 days ago.  Patient is followed at [facility] neurology for headaches and vertigo, has history of CVA in the past.  Patient reports chronic headaches and intermittent dizziness thought to be related to vertigo.  She was last seen on 11/04/2019 by [facility] neurology at that time it appears an MRI had been planned but patient was asymptomatic at that time.  Patient reports that she is concerned that she may have had a stroke 2 days ago.  She reports that her headache and dizziness had acutely worsened at that time and was accompanied with some confusion.  The symptoms have gradually improved over the last 2 days.  She describes her dizziness as a mild spinning sensation, reports headache has resolved and that she no longer feels confused. Patient reports that her headache doctor informed her that her if your symptoms worsen she should come to the ER for evaluation and possible MRI.  Current complaints at time of my evaluation are mild dizziness, no other concerns.  Blood work, CT scan and MRI ordered in triage.  Patient denies fever/chills, headache, vision changes, numbness/tingling, weakness, neck pain, fall/injury, chest pain, difficulty breathing, abdominal pain, nausea/vomiting, diarrhea, dysuria or any additional concerns.  HPI     Past Medical History:  Diagnosis Date  . Depression   . Frequent headaches   . 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 Hyperlipidemia   . Hypertension   . Stroke [facility])    09/2015    Patient Active Problem List   Diagnosis Date Noted  . Essential hypertension 07/05/2019  . Left knee pain 01/08/2018  . 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 Premature ventricular contractions 03/25/2016  . Hyperlipidemia 03/25/2016  . Benign paroxysmal positional vertigo 03/11/2016  . Neck pain on right side 02/19/2016  . Cryptogenic stroke ([place]) 12/28/2015  . Multiple hemangiomas   . Cerebral infarction due to unspecified mechanism   . 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 Migraines 10/11/2015    Past Surgical History:  Procedure Laterality Date  . RIGHT ARM SURGERY    . TEE WITHOUT CARDIOVERSION N/A 10/13/2015   Procedure: TRANSESOPHAGEAL ECHOCARDIOGRAM (TEE);  Surgeon: Lelon Perla, MD;  Location: [facility] ENDOSCOPY;  Service: Cardiovascular;  Laterality: N/A;     OB History    Gravida  2   Para  2   Term  1   Preterm  1   AB  0   Living  2     SAB  0   TAB  0   Ectopic  0   Multiple  0   Live Births  2           Family History  Problem Relation Age of Onset  . Leukemia Mother   . Colon cancer Brother   . Asthma Neg Hx   . Cancer Neg Hx   . Diabetes Neg Hx   . Hyperlipidemia Neg Hx   . Heart failure Neg Hx   . Hypertension Neg Hx   . Migraines Neg Hx   . Rashes / Skin problems Neg Hx   . Seizures Neg Hx   .  Stroke Neg Hx   . Thyroid disease Neg Hx     Social History   Tobacco Use  . Smoking status: Never Smoker  . Smokeless tobacco: Never Used  Substance Use Topics  . Alcohol use: No  . Drug use: No    Home Medications Prior to Admission medications   Medication Sig Start Date End Date Taking? Authorizing Provider  aspirin 81 MG tablet Take 1 tablet (81 mg total) by mouth daily. 12/30/18   Frann Rider, NP  meclizine (ANTIVERT) 25 MG tablet Take 1 tablet (25 mg total) by mouth 3 (three) times daily. 11/04/19   Frann Rider, NP  metoprolol tartrate (LOPRESSOR) 25 MG tablet Take 0.5 tablets (12.5 mg total) by mouth 2 (two) times daily. 10/01/19 12/30/19  [name], PA  pravastatin (PRAVACHOL) 10 MG tablet Take 10 mg by mouth daily.    [provider]     Allergies    Lipitor [atorvastatin calcium] and Tramadol  Review of Systems   Review of Systems Ten systems are reviewed and are negative for acute change except as noted in the HPI  Physical Exam Updated Vital Signs BP 120/71   Pulse 65   Temp 98.4 F (36.9 C) (Oral)   Resp 15   Wt 56.7 kg   SpO2 100%   BMI 23.62 kg/m   Physical Exam Constitutional:      General: She is not in acute distress.    Appearance: Normal appearance. She is well-developed. She is not ill-appearing or diaphoretic.  HENT:     Head: Normocephalic and atraumatic.     Right Ear: External ear normal.     Left Ear: External ear normal.     Nose: Nose normal.  Eyes:     General: Vision grossly intact. Gaze aligned appropriately.     Pupils: Pupils are equal, round, and reactive to light.  Neck:     Trachea: Trachea and phonation normal. No tracheal deviation.  Pulmonary:     Effort: Pulmonary effort is normal. No respiratory distress.  Abdominal:     General: There is no distension.     Palpations: Abdomen is soft.     Tenderness: There is no abdominal tenderness. There is no guarding or rebound.  Musculoskeletal:        General: Normal range of motion.     Cervical back: Normal range of motion.  Skin:    General: Skin is warm and dry.  Neurological:     Mental Status: She is alert.     GCS: GCS eye subscore is 4. GCS verbal subscore is 5. GCS motor subscore is 6.     Comments: Mental Status: Alert, oriented, thought content appropriate, able to give a coherent history. Speech fluent without evidence of aphasia. Able to follow 2 step commands without difficulty. Cranial Nerves: II: Peripheral visual fields grossly normal, pupils equal, round, reactive to light III,IV, VI: ptosis not present, extra-ocular motions intact bilaterally V,VII: smile symmetric, eyebrows raise symmetric, facial light touch sensation equal VIII: hearing grossly normal to voice X: uvula elevates symmetrically  XI: bilateral shoulder shrug symmetric and strong XII: midline tongue extension without fassiculations Motor: Normal tone. 5/5 strength in upper and lower extremities bilaterally including strong and equal grip strength and dorsiflexion/plantar flexion Sensory: Sensation intact to light touch in all extremities. Cerebellar: normal finger-to-nose with bilateral upper extremities. Normal heel-to -shin balance bilaterally of the lower extremity. No pronator drift.  CV: distal pulses palpable throughout Stable gait in room without  assistance  Psychiatric:        Behavior: Behavior normal.     ED Results / Procedures / Treatments   Labs (all labs ordered are listed, but only abnormal results are displayed) Labs Reviewed  COMPREHENSIVE METABOLIC PANEL - Abnormal; Notable for the following components:      Result Value   Glucose, Bld 109 (*)    All other components within normal limits  I-STAT CHEM 8, ED - Abnormal; Notable for the following components:   Glucose, Bld 101 (*)    All other components within normal limits  PROTIME-INR  APTT  CBC  DIFFERENTIAL  I-STAT BETA HCG BLOOD, ED ([facility], [facility], [facility] ONLY)  CBG MONITORING, ED    EKG EKG Interpretation  Date/Time:  Saturday November 06 2019 19:55:50 EST Ventricular Rate:  72 PR Interval:    QRS Duration: 95 QT Interval:  382 QTC Calculation: 418 R Axis:   45 Text Interpretation: Sinus rhythm When compared to prior, no significant changes seen. No STEMI Confirmed by Antony Blackbird [phone]) on 11/06/2019 8:33:07 PM   Radiology CT HEAD WO CONTRAST  Result Date: 11/06/2019 CLINICAL DATA:  Headache, dizziness, and blurred vision since last night. History of Stroke EXAM: CT HEAD WITHOUT CONTRAST TECHNIQUE: Contiguous axial images were obtained from the base of the skull through the vertex without intravenous contrast. COMPARISON:  05/08/2016. FINDINGS: Brain: No evidence of acute infarction, hemorrhage, hydrocephalus, extra-axial  collection or mass lesion/mass effect. Vascular: No hyperdense vessel or unexpected calcification. Skull: Normal. Negative for fracture or focal lesion. Sinuses/Orbits: Globes and orbits are unremarkable. Visualized sinuses and mastoid air cells are clear. Other: None. IMPRESSION: Normal unenhanced CT scan of the brain. Electronically Signed   By: Lajean Manes M.D.   On: 11/06/2019 14:49   MR Brain W and Wo Contrast  Result Date: 11/06/2019 CLINICAL DATA:  Dizziness. EXAM: MRI HEAD WITHOUT AND WITH CONTRAST TECHNIQUE: Multiplanar, multiecho pulse sequences of the brain and surrounding structures were obtained without and with intravenous contrast. CONTRAST:  45mL GADAVIST GADOBUTROL 1 MMOL/ML IV SOLN COMPARISON:  MRI head 08/04/2018.  CT head 11/06/2019 FINDINGS: Brain: No acute infarction, hemorrhage, hydrocephalus, extra-axial collection or mass lesion. Small hyperintensities in the frontal white matter bilaterally, stable. No new lesions. Brainstem normal. Normal enhancement. Vascular: Normal arterial flow voids. Skull and upper cervical spine: No focal skull lesion. Sinuses/Orbits: Negative Other: None IMPRESSION: No acute abnormality. Small bilateral white matter hyperintensities unchanged from 2019 and likely related to chronic ischemia. Electronically Signed   By: Franchot Gallo M.D.   On: 11/06/2019 20:07    Procedures Procedures (including critical care time)  Medications Ordered in ED Medications  sodium chloride flush (NS) 0.9 % injection 3 mL (has no administration in time range)  gadobutrol (GADAVIST) 1 MMOL/ML injection 5 mL (5 mLs Intravenous Contrast Given 11/06/19 1946)    ED Course  I have reviewed the triage vital signs and the nursing notes.  Pertinent labs & imaging results that were available during my care of the patient were reviewed by me and considered in my medical decision making (see chart for details).    MDM Rules/Calculators/A&P    EKG: Sinus rhythm When  compared to prior, no significant changes seen. No STEMI Confirmed by Antony Blackbird [phone]) on 11/06/2019 8:33:07 PM  I-STAT Chem-8 nonacute Beta-hCG negative APTT within normal limits PT/INR within normal limits  CMP nonacute  CBC within normal limits CT Head:  IMPRESSION:  Normal unenhanced CT scan of the brain.  MRI  brain with and without contrast:  IMPRESSION:  No acute abnormality. Small bilateral white matter hyperintensities  unchanged from 2019 and likely related to chronic ischemia.  - Patient reassessed resting comfortably no acute distress.  I have discussed results as above with patient and her daughter they both state understanding.  Plan of care at this time is for patient to call her neurologist office tomorrow to schedule a follow-up appointment.  Patient ambulated around room without assistance, stable gait, no recurrent dizziness.  She has meclizine at home from recent neurologist visit she will use for suspected peripheral vertigo.  At this time there does not appear to be any evidence of an acute emergency medical condition and the patient appears stable for discharge with appropriate outpatient follow up. Diagnosis was discussed with patient who verbalizes understanding of care plan and is agreeable to discharge. I have discussed return precautions with patient and daughter who verbalizes understanding of return precautions. Patient encouraged to follow-up with their PCP and neurologist. All questions answered.  Patient's case discussed with Dr. Sherry Ruffing who agrees with plan to discharge with follow-up.   [place] phone interpreter used during this visit.  Note: Portions of this report may have been transcribed using voice recognition software. Every effort was made to ensure accuracy; however, inadvertent computerized transcription errors may still be present. Final Clinical Impression(s) / ED Diagnoses Final diagnoses:  Dizziness  Vertigo    Rx / DC Orders ED  Discharge Orders    None       Gari Crown 11/06/19 2127    Tegeler, Gwenyth Allegra, MD 11/07/19 0010

## 2019-11-08 ENCOUNTER — Telehealth: Payer: Self-pay | Admitting: Adult Health

## 2019-11-08 NOTE — Telephone Encounter (Signed)
Pt called stating that she was in the ER on Saturday and that they told her to come in today to see the provider. Pt wanted to know what time can she come in today. Pt was informed that this was not a walk in clinic she would have to have an appt. Pt insisted that her headaches are terrible and she needs to be seen in the office asap. Please advise.

## 2019-11-08 NOTE — Telephone Encounter (Signed)
I called pt and she was seen in the ED for vertigo, headaches.  Told to f/u here for appt.  I made appt for tomorrow at 1045 arrive 1045.  She was not having any dizziness, or headache tolday.  Has been taking meclizine.  Will need interpreter.

## 2019-11-09 ENCOUNTER — Other Ambulatory Visit: Payer: Self-pay

## 2019-11-09 ENCOUNTER — Encounter: Payer: Self-pay | Admitting: Adult Health

## 2019-11-09 ENCOUNTER — Ambulatory Visit: Payer: Self-pay | Admitting: Adult Health

## 2019-11-09 VITALS — BP 124/71 | HR 82 | Temp 98.0°F | Ht 61.0 in | Wt 128.8 lb

## 2019-11-09 DIAGNOSIS — E785 Hyperlipidemia, unspecified: Secondary | ICD-10-CM

## 2019-11-09 DIAGNOSIS — R42 Dizziness and giddiness: Secondary | ICD-10-CM

## 2019-11-09 DIAGNOSIS — Z8673 Personal history of transient ischemic attack (TIA), and cerebral infarction without residual deficits: Secondary | ICD-10-CM

## 2019-11-09 DIAGNOSIS — I10 Essential (primary) hypertension: Secondary | ICD-10-CM

## 2019-11-09 MED ORDER — MECLIZINE HCL 25 MG PO TABS: 25.0000 mg | ORAL_TABLET | Freq: Two times a day (BID) | ORAL | 0 refills | Status: DC

## 2019-11-09 NOTE — Progress Notes (Signed)
[name] [address] [place]. [place] [address] [phone]       OFFICE FOLLOW UP NOTE  [name] Date of Birth:  10-04-1971 Medical Record Number:  [id number]   Referring MD:  Jarold Motto, PA-c Reason for Referral:  headache  Chief Complaint  Patient presents with  . Follow-up    Interpreter present. Treatment room. Patient mentioned that she doesnt have any dizziness at present. She stated that the medication is helping.  She also stated that she would like to discuss if she should take her medication all together or wait a couple mins in between.   . Medication Refill    ASA 81 mg    HPI:  HISTORY SUMMARY: Initial visit 09/29/2018 Dr. Pearlean Brownie:  [name] is a pleasant 1 year Hispanic lady originally from [place] who was seen today for evaluation for headaches.  She is accompanied by her friend as well as Spanish language interpreter who provides translation for this visit.  History is obtained from them, review of electronic medical records and have personally reviewed imaging films.  Patient has been complaining of increasing headaches for the last 2 months.  She describes this headache as being right temporal and frontal mostly throbbing in nature though fluctuating in severity from 6/10 to 10/10.  Headache is accompanied by nausea vomiting light and sound sensitivity.  She has to stop her activities and lie down and sleeping as well as taking Tylenol seems to help.  Headaches are accompanied by a single vision spots and scintillations and blurred vision.  She denies any speech difficulties, vertigo, diplopia, gait or balance difficulties with headaches.  Headaches usually last about 30 to 40 minutes.  Headaches of late have been occurring about 2-3 times per week.  She does give a remote history of infrequent headaches which sound like migraine headaches and was similar but were mostly perimenstrual and not bad.  The patient also states in  September 2019 she had a episode of speech difficulties as well as dizziness.  She was seen by primary care physician who states that the patient had a headache as well as feeling of dizziness and some speech difficulties.  This happened following a stressful event.  An outpatient MRI was ordered which was done on 08/05/2018 which I personally reviewed shows a few nonspecific white matter hyperintensities but no acute abnormality or definite stroke.  Patient states her symptoms improved within a few days but the headaches have persisted since then.  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 MRA of the brain showed slight diminished peripheral branches in the left MCA but no large vessel stenosis or occlusion. Carotid ultrasound showed no significant extracranial stenosis. Transthoracic echo showed normal ejection fraction. Transesophageal echocardiogram showed no cardiac source of embolism or PFO. Antiphospholipid antibodies and lupus anticoagulant were both negative. Lipid profile showed elevated total cholesterol 207 and LDL 139 mg percent. Hemoglobin A1c was 5.6. Patient was started on aspirin as well as protocol. She states her speech difficulty and right-sided weakness completely improved.  She was placed on aspirin and pravastatin which she actually did well on but for unclear reason she discontinued both medications.  She is currently on Lipitor 40 which was started a few months ago by primary physician but she is complaining of dizziness on this medication and  wants to change it or stop it.  She states she had slight balance difficulties from her previous stroke but had recovered well from that.  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  She states her blood pressure is under good  control.  Last lipid profile checked in August 2019 showed LDL of 120 following which Lipitor was added.  I had recommended doing a transcranial Doppler bubble study at last visit with me in 2017 but for some unclear reason this was never done.  Update 12/30/2018: Carla [name] is being seen today for follow-up visit for migraines and is accompanied by interpreter as patient is primarily Spanish-speaking.  Upon initial visit with Dr. Pearlean Brownie on 09/29/2018 (visit info below), headaches were not frequent enough to justify initiating migraine prophylaxis.  She did call back on 12/02/2018 reporting daily migraines and therefore initiated Topamax 25 mg twice daily.  She took this medication for 4 days but discontinued as medication was making her too fatigued.  She consider taking 1 tab at night only but wanted to speak with [place] in regards to this first.  She was evaluated in the ED on 12/19/2018 and returned on 12/23/2018 due to vaginal bleeding.  Apparently, all medications discontinued after 12/23/2018 visit and was started on Provera for 10-day course with recommending follow-up with OB/GYN for further evaluation.  She reports today that her headaches have improved and not experience a headache since ED admission on 12/23/2018.  She does have an appointment scheduled at [facility] on 01/12/2019 for further evaluation.  She denies any continued vaginal bleeding at this time.  Blood pressure today satisfactory 130/68. It was recommended after prior visit to undergo transcranial Doppler with bubble study to assess for possible PFO.  This was obtained on 10/14/2018 and was negative for PFO.  Update 06/21/2019 : She returns for follow-up after last visit 5 months ago.  She is accompanied by a [place] language interpreter who was present throughout the visit and interprets for her.  Patient states her main complaint today is that her migraine headaches seem to have come back.  In the last 3 months she has had increased  frequency of headaches which occur 3 to 4 days/week.  The headache starts off as a throbbing pain around her right temple as well as periorbital region becomes unbearable 10/10 in severity.  She has relied on.  Light and sound do worsen the headache.  She does not throw up or feel nauseous.  She does not have visual symptoms with the headaches.  Headache may last for most of the day.  She is unable to identify specific triggers for headaches but does feel tightness in the back of her head and neck muscles.  She has not tried triptans or prescription migraine specific medications she has tried taking Tylenol and ibuprofen without relief.  She has been taking naproxen which seems to work better.  She has been taking them 3 to 4 days a week.  She was previously on Topamax but when she started having dysfunctional uterine bleeding all the medications were stopped including Topamax.  She is unable to tell me if she had any bad side effects on it.  Patient recently changed her primary care physician and a new physician assistant discontinued protocol for unclear reason.  She remains on aspirin which is tolerating well without bruising or bleeding.  Her blood pressure is under good control and today it is 142/83.  She has  had no recurrent stroke or TIA symptoms.  Update 09/02/2019: Carla [name] is being seen today for 57-month follow-up regarding migraine headaches accompanied by interpreter.  After prior visit, Topamax 25 mg daily was restarted due to worsening migraines as well as Relpax for emergent relief.  She did not tolerate Topamax as she reports this caused weight loss and short-term memory loss.  She was unable to afford Relpax.  She has recently been experiencing migraines 2 times weekly and typically lasts for 1 hour total but during this time, migraines are debilitating and continues to be located right temporal area.  She has not trialed any other type of migraine medication.  Recent diagnosis of COVID-19 in  06/2019 and feels as though she has not completely recovered mentally and physically.  After prior visit, lipid panel obtained and recommended restart pravastatin but due to side effects, PCP initiated Crestor.  She reports difficulty tolerating Crestor but plans on following up with PCP for further monitoring and management.  Blood pressure today 135/91.  Update 11/04/2019: Carla [name] is a 48 year old female who is being seen today by patient request due to concerns of balance difficulties concern for stroke.  She is accompanied today by a interpreter.  She did not seek medical treatment at onset.  Approximately 1.5 weeks ago, she states she started experiencing intermittent balance impairment with a swaying type sensation and room spinning.  Symptoms severe where she will have to hold onto something and close her eyes.  She states she experienced same type of symptoms with prior stroke.  She denies associated headache, neck pain, speech difficulty, visual changes, weakness or numbness/tingling.  She denies increased stress or anxiety/depression.  She states standing straight up or laying flat or with head movement induces the symptoms.  She denies currently feeling symptoms at today's visit.  She does endorse recently stopping her metoprolol 3 days ago due to increased stress and irritability symptoms.  She does monitor blood pressure at home and SBP 130s.  She is unable to endorse diastolic pressures or heart rates.  Blood pressure today satisfactory at 132/96.  He does endorse continuation of aspirin 81 mg daily and recently started on pravastatin approximately 3 weeks ago by PCP.  Update 11/09/2019: Carla [name] is a 48 year old female who is being seen today by patient request due to recent ED admission on 11/06/2019 for ongoing concerns of dizziness previously evaluated in office 5 days ago.  She is accompanied by interpreter.  Per review of ED note, she reported worsening dizziness and headache 2 days  prior to ED admission concerning for stroke.  CT head and MRI brain no acute abnormalities.  She was advised to follow-up with PCP and neurology at discharge.  She has been doing well since discharge without any reoccurring vertigo or dizziness symptoms or headaches.  She has continued to take meclizine with benefit 25 mg tablets twice daily.  She continues on aspirin 81 mg daily and pravastatin for secondary stroke prevention without side effects. Blood pressure today 124/71.  No further concerns at this time.     ROS:   14 system review of systems is positive for migraines intermittent vertigo and all other systems negative  PMH:  Past Medical History:  Diagnosis Date  . Depression   . Frequent headaches   . 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 Hyperlipidemia   . Hypertension   . Stroke [facility])    09/2015    Social History:  Social History   Socioeconomic History  .  Marital status: Married    Spouse name: Not on file  . Number of children: Not on file  . Years of education: Not on file  . Highest education level: Not on file  Occupational History  . Occupation: Financial trader  Tobacco Use  . Smoking status: Never Smoker  . Smokeless tobacco: Never Used  Substance and Sexual Activity  . Alcohol use: No  . Drug use: No  . Sexual activity: Not Currently    Birth control/protection: None  Other Topics Concern  . Not on file  Social History Narrative   From [place]   Lived in [place] for 20 years, and been in [place] for 6 years   Husband and son, 73   Works: clean homes   Social Determinants of Corporate investment banker Strain:   . Difficulty of Paying Living Expenses: Not on file  Food Insecurity:   . Worried About Programme researcher, broadcasting/film/video in the Last Year: Not on file  . Ran Out of Food in the Last Year: Not on file  Transportation Needs:   . Lack of Transportation (Medical): Not on file  . Lack of Transportation (Non-Medical): Not on file  Physical Activity:   . Days of Exercise  per Week: Not on file  . Minutes of Exercise per Session: Not on file  Stress:   . Feeling of Stress : Not on file  Social Connections:   . Frequency of Communication with Friends and Family: Not on file  . Frequency of Social Gatherings with Friends and Family: Not on file  . Attends Religious Services: Not on file  . Active Member of Clubs or Organizations: Not on file  . Attends Banker Meetings: Not on file  . Marital Status: Not on file  Intimate Partner Violence:   . Fear of Current or Ex-Partner: Not on file  . Emotionally Abused: Not on file  . Physically Abused: Not on file  . Sexually Abused: Not on file    Medications:   Current Outpatient Medications on File Prior to Visit  Medication Sig Dispense Refill  . aspirin 81 MG tablet Take 1 tablet (81 mg total) by mouth daily. 30 tablet 1  . metoprolol tartrate (LOPRESSOR) 25 MG tablet Take 0.5 tablets (12.5 mg total) by mouth 2 (two) times daily. 90 tablet 0  . pravastatin (PRAVACHOL) 10 MG tablet Take 10 mg by mouth daily.     No current facility-administered medications on file prior to visit.    Allergies:   Allergies  Allergen Reactions  . Lipitor [Atorvastatin Calcium] Swelling    throat  . Tramadol Nausea Only    Physical Exam  Today's Vitals   11/09/19 1023  BP: 124/71  Pulse: 82  Temp: 98 F (36.7 C)  TempSrc: Oral  Weight: 128 lb 12.8 oz (58.4 kg)  Height: 5\' 1"  (1.549 m)   Body mass index is 24.34 kg/m.   General: well developed, well nourished pleasant middle-aged Hispanic lady, seated, mildly anxious Head: head normocephalic and atraumatic.   Neck: supple with no carotid or supraclavicular bruits Cardiovascular: regular rate and rhythm, no murmurs Musculoskeletal: no deformity Skin:  no rash/petichiae Vascular:  Normal pulses all extremities  Neurologic Exam Mental Status: awake and fully alert. Oriented to place and time. Recent and remote memory intact. Attention span,  concentration and fund of knowledge appropriate. Mood and affect anxious and tearful.  Cranial Nerves: Pupils equal, briskly reactive to light.  Extraocular movements full without  nystagmus. Visual fields full to confrontation with subjective blurriness (chronic). Hearing intact. Facial sensation intact. Face, tongue, palate moves normally and symmetrically.  Extension of head and side to side did not produce symptoms Motor: Normal bulk and tone. Normal strength in all tested extremity muscles. Sensory.: intact to touch , pinprick , position and vibratory sensation.  Coordination: Rapid alternating movements normal in all extremities. Finger-to-nose and heel-to-shin performed accurately bilaterally. Gait and Station: Arises from chair without difficulty. Stance is normal. Gait demonstrates normal stride length and balance . Able to heel, toe and tandem walk without difficulty.  Romberg negative. Reflexes: 1+ and symmetric. Toes downgoing.      ASSESSMENT:  Carla [name] is a 48 year old Hispanic lady with with PMH of cryptogenic stroke 2016, TIA, HDL, HTN and migraines.  Recently seen on 11/04/2019 due to 1.5-week onset of intermittent vertigo worsened with position and head movements.  She was asymptomatic during visit and ordered MRI to be obtained to assess for acute abnormality.  She presented to ED that same evening due to additional episode of vertigo.  All imaging and work-up unremarkable.  She has continued on meclizine with benefit.  Denies additional vertigo/dizziness symptoms.    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 -Advised to continue meclizine 25 mg twice daily over the next 2 weeks and if stable, can use as needed -She questioned if MRI ordered at prior visit is to be obtained and she was advised that this can be canceled -Continue aspirin 81 mg and pravastatin for secondary stroke prevention  -Continue to follow with PCP for HTN and HLD  management and secondary stroke risk factor management -Continue stay active and maintain a healthy diet -Advised to call office with any recurrent/worsening episodes or to call 911 for further evaluation  Follow-up in 3 months or call earlier if needed  Greater than 50% time during this 25-minute visit was spent on counseling and coordination of care about new onset of vertigo likely BPPV, reviewed recent hospitalization along with reviewing discussion of imaging and answering questions to satisfaction with assistance of interpreter  Ihor Austin, [facility]  [facility] [address] [place], [place] [address]  Phone [phone] Fax [phone] Note: This document was prepared with digital dictation and possible smart phrase technology. Any transcriptional errors that result from this process are unintentional.

## 2019-11-09 NOTE — Patient Instructions (Signed)
Your Plan:  Continue meclizine 2 times daily for an additional 2 weeks and if still doing well without reoccurring vertigo/dizziness symptoms, you can then take as needed  Schedule initial evaluation with physical rehab for vestibular therapy   Follow-up in 3 months or call earlier if needed     Thank you for coming to see [place] at [facility]. I hope we have been able to provide you high quality care today.  You may receive a patient satisfaction survey over the next few weeks. We would appreciate your feedback and comments so that we may continue to improve ourselves and the health of our patients.

## 2019-11-09 NOTE — Progress Notes (Signed)
I agree with the above plan 

## 2019-12-01 ENCOUNTER — Other Ambulatory Visit: Payer: Medicaid Other

## 2019-12-03 ENCOUNTER — Telehealth: Payer: Self-pay | Admitting: Physician Assistant

## 2019-12-03 ENCOUNTER — Other Ambulatory Visit: Payer: Self-pay | Admitting: Physician Assistant

## 2019-12-03 NOTE — Telephone Encounter (Signed)
Spoke to pt told her Rx was sent to pharmacy about half hour ago so they should have it. Pt verbalized understanding.

## 2019-12-03 NOTE — Telephone Encounter (Signed)
Patient calls in saying [facility] is needing permission/virbalization to refill the metoprolol tartrate 25 MG

## 2020-01-03 ENCOUNTER — Ambulatory Visit: Payer: Medicaid Other | Admitting: Adult Health

## 2020-01-03 ENCOUNTER — Encounter: Payer: Self-pay | Admitting: Adult Health

## 2020-01-04 ENCOUNTER — Ambulatory Visit: Payer: Self-pay | Admitting: Adult Health

## 2020-01-04 ENCOUNTER — Other Ambulatory Visit: Payer: Self-pay

## 2020-01-04 ENCOUNTER — Encounter: Payer: Self-pay | Admitting: Adult Health

## 2020-01-04 VITALS — BP 127/78 | HR 55 | Temp 97.8°F | Ht 61.0 in | Wt 132.2 lb

## 2020-01-04 DIAGNOSIS — R42 Dizziness and giddiness: Secondary | ICD-10-CM

## 2020-01-04 DIAGNOSIS — E785 Hyperlipidemia, unspecified: Secondary | ICD-10-CM

## 2020-01-04 DIAGNOSIS — I10 Essential (primary) hypertension: Secondary | ICD-10-CM

## 2020-01-04 DIAGNOSIS — Z8673 Personal history of transient ischemic attack (TIA), and cerebral infarction without residual deficits: Secondary | ICD-10-CM

## 2020-01-04 DIAGNOSIS — G459 Transient cerebral ischemic attack, unspecified: Secondary | ICD-10-CM

## 2020-01-04 MED ORDER — MECLIZINE HCL 25 MG PO TABS: 25.0000 mg | ORAL_TABLET | Freq: Two times a day (BID) | ORAL | 0 refills | Status: DC | PRN

## 2020-01-04 NOTE — Progress Notes (Signed)
I agree with the above plan 

## 2020-01-04 NOTE — Patient Instructions (Signed)
Continue aspirin 81 mg daily  and pravastatin  for secondary stroke prevention  Continue to follow up with PCP regarding cholesterol and blood pressure management   Change frequency of meclizine to twice daily AS NEEDED  Continue to monitor blood pressure at home  Maintain strict control of hypertension with blood pressure goal below 130/90, diabetes with hemoglobin A1c goal below 6.5% and cholesterol with LDL cholesterol (bad cholesterol) goal below 70 mg/dL. I also advised the patient to eat a healthy diet with plenty of whole grains, cereals, fruits and vegetables, exercise regularly and maintain ideal body weight.  Followup in the future with me in 6 months or call earlier if needed       Thank you for coming to see [place] at [facility]. I hope we have been able to provide you high quality care today.  You may receive a patient satisfaction survey over the next few weeks. We would appreciate your feedback and comments so that we may continue to improve ourselves and the health of our patients.

## 2020-01-04 NOTE — Progress Notes (Signed)
[name] [address] [place]. [place] [address] [phone]       OFFICE FOLLOW UP NOTE  [name] Date of Birth:  December 27, 1970 Medical Record Number:  [id number]   Referring MD:  Jarold Motto, PA-c Reason for Referral:  headache  Chief Complaint  Patient presents with  . Follow-up    Rm9. alone. states that the vertigo meds make her sleepy. Otherwise she is doing well.    HPI:  HISTORY SUMMARY: Initial visit 09/29/2018 Dr. Pearlean Brownie:  [name] is a pleasant 49 year Hispanic lady originally [place] who was seen today for evaluation for headaches.  She is accompanied by her friend as well as Spanish language interpreter who provides translation for this visit.  History is obtained from them, review of electronic medical records and have personally reviewed imaging films.  Patient has been complaining of increasing headaches for the last 2 months.  She describes this headache as being right temporal and frontal mostly throbbing in nature though fluctuating in severity from 6/10 to 10/10.  Headache is accompanied by nausea vomiting light and sound sensitivity.  She has to stop her activities and lie down and sleeping as well as taking Tylenol seems to help.  Headaches are accompanied by a single vision spots and scintillations and blurred vision.  She denies any speech difficulties, vertigo, diplopia, gait or balance difficulties with headaches.  Headaches usually last about 30 to 40 minutes.  Headaches of late have been occurring about 2-3 times per week.  She does give a remote history of infrequent headaches which sound like migraine headaches and was similar but were mostly perimenstrual and not bad.  The patient also states in September 2019 she had a episode of speech difficulties as well as dizziness.  She was seen by primary care physician who states that the patient had a headache as well as feeling of dizziness and some speech difficulties.  This  happened following a stressful event.  An outpatient MRI was ordered which was done on 08/05/2018 which I personally reviewed shows a few nonspecific white matter hyperintensities but no acute abnormality or definite stroke.  Patient states her symptoms improved within a few days but the headaches have persisted since then.  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 MRA of the brain showed slight diminished peripheral branches in the left MCA but no large vessel stenosis or occlusion. Carotid ultrasound showed no significant extracranial stenosis. Transthoracic echo showed normal ejection fraction. Transesophageal echocardiogram showed no cardiac source of embolism or PFO. Antiphospholipid antibodies and lupus anticoagulant were both negative. Lipid profile showed elevated total cholesterol 207 and LDL 139 mg percent. Hemoglobin A1c was 5.6. Patient was started on aspirin as well as protocol. She states her speech difficulty and right-sided weakness completely improved.  She was placed on aspirin and pravastatin which she actually did well on but for unclear reason she discontinued both medications.  She is currently on Lipitor 40 which was started a few months ago by primary physician but she is complaining of dizziness on this medication and wants to change it or stop it.  She states she had slight balance difficulties from her previous stroke but had recovered well from that.  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  She states her blood pressure is under good control.  Last lipid profile checked in August 2019 showed LDL of 120 following which Lipitor was added.  I had recommended doing a transcranial Doppler bubble study at last visit with me in 2017 but for some unclear reason this was  never done.  Update 12/30/2018: Carla [name] is being seen today for follow-up visit for migraines and is accompanied by interpreter as patient is primarily Spanish-speaking.  Upon initial visit with Dr. Pearlean Brownie on 09/29/2018 (visit info below), headaches were not frequent enough to justify initiating migraine prophylaxis.  She did call back on 12/02/2018 reporting daily migraines and therefore initiated Topamax 25 mg twice daily.  She took this medication for 4 days but discontinued as medication was making her too fatigued.  She consider taking 1 tab at night only but wanted to speak with [place] in regards to this first.  She was evaluated in the ED on 12/19/2018 and returned on 12/23/2018 due to vaginal bleeding.  Apparently, all medications discontinued after 12/23/2018 visit and was started on Provera for 10-day course with recommending follow-up with OB/GYN for further evaluation.  She reports today that her headaches have improved and not experience a headache since ED admission on 12/23/2018.  She does have an appointment scheduled at [facility] on 01/12/2019 for further evaluation.  She denies any continued vaginal bleeding at this time.  Blood pressure today satisfactory 130/68. It was recommended after prior visit to undergo transcranial Doppler with bubble study to assess for possible PFO.  This was obtained on 10/14/2018 and was negative for PFO.  Update 06/21/2019 : She returns for follow-up after last visit 5 months ago.  She is accompanied by a [place] language interpreter who was present throughout the visit and interprets for her.  Patient states her main complaint today is that her migraine headaches seem to have come back.  In the last 3 months she has had increased frequency of headaches which occur 3 to 4 days/week.  The headache starts off as a throbbing pain around her right temple as well as periorbital region becomes unbearable 10/10 in severity.  She has relied on.  Light and sound do worsen the  headache.  She does not throw up or feel nauseous.  She does not have visual symptoms with the headaches.  Headache may last for most of the day.  She is unable to identify specific triggers for headaches but does feel tightness in the back of her head and neck muscles.  She has not tried triptans or prescription migraine specific medications she has tried taking Tylenol and ibuprofen without relief.  She has been taking naproxen which seems to work better.  She has been taking them 3 to 4 days a week.  She was previously on Topamax but when she started having dysfunctional uterine bleeding all the medications were stopped including Topamax.  She is unable to tell me if she had any bad side effects on it.  Patient recently changed her primary care physician and a new physician assistant discontinued protocol for unclear reason.  She remains on aspirin which is tolerating well without bruising or bleeding.  Her blood pressure is under good control and today it is 142/83.  She has had no recurrent stroke or TIA symptoms.  Update 09/02/2019: Carla [name] is being seen today for 8-month follow-up regarding migraine headaches accompanied by interpreter.  After prior visit, Topamax 25 mg daily was restarted due to worsening migraines as well as  Relpax for emergent relief.  She did not tolerate Topamax as she reports this caused weight loss and short-term memory loss.  She was unable to afford Relpax.  She has recently been experiencing migraines 2 times weekly and typically lasts for 1 hour total but during this time, migraines are debilitating and continues to be located right temporal area.  She has not trialed any other type of migraine medication.  Recent diagnosis of COVID-19 in 06/2019 and feels as though she has not completely recovered mentally and physically.  After prior visit, lipid panel obtained and recommended restart pravastatin but due to side effects, PCP initiated Crestor.  She reports difficulty  tolerating Crestor but plans on following up with PCP for further monitoring and management.  Blood pressure today 135/91.  Update 11/04/2019: Carla [name] is a 49 year old female who is being seen today by patient request due to concerns of balance difficulties concern for stroke.  She is accompanied today by a interpreter.  She did not seek medical treatment at onset.  Approximately 1.5 weeks ago, she states she started experiencing intermittent balance impairment with a swaying type sensation and room spinning.  Symptoms severe where she will have to hold onto something and close her eyes.  She states she experienced same type of symptoms with prior stroke.  She denies associated headache, neck pain, speech difficulty, visual changes, weakness or numbness/tingling.  She denies increased stress or anxiety/depression.  She states standing straight up or laying flat or with head movement induces the symptoms.  She denies currently feeling symptoms at today's visit.  She does endorse recently stopping her metoprolol 3 days ago due to increased stress and irritability symptoms.  She does monitor blood pressure at home and SBP 130s.  She is unable to endorse diastolic pressures or heart rates.  Blood pressure today satisfactory at 132/96.  He does endorse continuation of aspirin 81 mg daily and recently started on pravastatin approximately 3 weeks ago by PCP.  Update 11/09/2019: Carla [name] is a 49 year old female who is being seen today by patient request due to recent ED admission on 11/06/2019 for ongoing concerns of dizziness previously evaluated in office 5 days ago.  She is accompanied by interpreter.  Per review of ED note, she reported worsening dizziness and headache 2 days prior to ED admission concerning for stroke.  CT head and MRI brain no acute abnormalities.  She was advised to follow-up with PCP and neurology at discharge.  She has been doing well since discharge without any reoccurring vertigo or  dizziness symptoms or headaches.  She has continued to take meclizine with benefit 25 mg tablets twice daily.  She continues on aspirin 81 mg daily and pravastatin for secondary stroke prevention without side effects. Blood pressure today 124/71.  No further concerns at this time.  Update 01/04/2020: Carla [name] is a 49 year old female who is being seen today for follow-up regarding prior concerns of dizziness accompanied by interpreter.  Denies residual/dizziness symptoms.  Continues twice daily meclizine with occasional fatigue.  Continues on aspirin 81 mg daily and pravastatin for secondary stroke prevention without side effects.  Blood pressure today 127/78.  Mild short-term memory loss but endorses ongoing improvement.  No concerns at this time.    ROS:   14 system review of systems is positive for see HPI and all other systems negative  PMH:  Past Medical History:  Diagnosis Date  . Depression   . Frequent headaches   . 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  Hyperlipidemia   . Hypertension   . Stroke [facility])    09/2015    Social History:  Social History   Socioeconomic History  . Marital status: Married    Spouse name: Not on file  . Number of children: Not on file  . Years of education: Not on file  . Highest education level: Not on file  Occupational History  . Occupation: Financial trader  Tobacco Use  . Smoking status: Never Smoker  . Smokeless tobacco: Never Used  Substance and Sexual Activity  . Alcohol use: No  . Drug use: No  . Sexual activity: Not Currently    Birth control/protection: None  Other Topics Concern  . Not on file  Social History Narrative   From [place]   Lived in [place] for 20 years, and been in [place] for 6 years   Husband and son, 55   Works: clean homes   Social Determinants of Corporate investment banker Strain:   . Difficulty of Paying Living Expenses: Not on file  Food Insecurity:   . Worried About Programme researcher, broadcasting/film/video in the Last Year: Not on file  .  Ran Out of Food in the Last Year: Not on file  Transportation Needs:   . Lack of Transportation (Medical): Not on file  . Lack of Transportation (Non-Medical): Not on file  Physical Activity:   . Days of Exercise per Week: Not on file  . Minutes of Exercise per Session: Not on file  Stress:   . Feeling of Stress : Not on file  Social Connections:   . Frequency of Communication with Friends and Family: Not on file  . Frequency of Social Gatherings with Friends and Family: Not on file  . Attends Religious Services: Not on file  . Active Member of Clubs or Organizations: Not on file  . Attends Banker Meetings: Not on file  . Marital Status: Not on file  Intimate Partner Violence:   . Fear of Current or Ex-Partner: Not on file  . Emotionally Abused: Not on file  . Physically Abused: Not on file  . Sexually Abused: Not on file    Medications:   Current Outpatient Medications on File Prior to Visit  Medication Sig Dispense Refill  . aspirin 81 MG tablet Take 1 tablet (81 mg total) by mouth daily. 30 tablet 1  . meclizine (ANTIVERT) 25 MG tablet Take 1 tablet (25 mg total) by mouth 2 (two) times daily. 60 tablet 0  . metoprolol tartrate (LOPRESSOR) 25 MG tablet TAKE 0.5 TABLETS (12.5 MG TOTAL) BY MOUTH 2 (TWO) TIMES DAILY. 90 tablet 0  . pravastatin (PRAVACHOL) 10 MG tablet Take 10 mg by mouth daily.     No current facility-administered medications on file prior to visit.    Allergies:   Allergies  Allergen Reactions  . Lipitor [Atorvastatin Calcium] Swelling    throat  . Tramadol Nausea Only    Physical Exam  Today's Vitals   01/04/20 1233  BP: 127/78  Pulse: (!) 55  Temp: 97.8 F (36.6 C)  Weight: 132 lb 3.2 oz (60 kg)  Height: 5\' 1"  (1.549 m)   Body mass index is 24.98 kg/m.   General: well developed, well nourished pleasant middle-aged Hispanic lady, seated, mildly anxious Head: head normocephalic and atraumatic.   Neck: supple with no carotid or  supraclavicular bruits Cardiovascular: regular rate and rhythm, no murmurs Musculoskeletal: no deformity Skin:  no rash/petichiae Vascular:  Normal pulses all  extremities  Neurologic Exam Mental Status: awake and fully alert. Oriented to place and time. Recent and remote memory intact. Attention span, concentration and fund of knowledge appropriate. Mood and affect anxious and tearful.  Cranial Nerves: Pupils equal, briskly reactive to light.  Extraocular movements full without nystagmus. Visual fields full to confrontation with subjective blurriness (chronic). Hearing intact. Facial sensation intact. Face, tongue, palate moves normally and symmetrically.  Extension of head and side to side did not produce symptoms Motor: Normal bulk and tone. Normal strength in all tested extremity muscles. Sensory.: intact to touch , pinprick , position and vibratory sensation.  Coordination: Rapid alternating movements normal in all extremities. Finger-to-nose and heel-to-shin performed accurately bilaterally. Gait and Station: Arises from chair without difficulty. Stance is normal. Gait demonstrates normal stride length and balance . Able to heel, toe and tandem walk without difficulty.  Romberg negative. Reflexes: 1+ and symmetric. Toes downgoing.      ASSESSMENT:  Carla [name] is a 49 year old Hispanic lady with with PMH of cryptogenic stroke 2016, TIA, HDL, HTN and migraines.  Recently seen on 11/04/2019 due to 1.5-week onset of intermittent vertigo worsened with position and head movements.  She was asymptomatic during visit and ordered MRI to be obtained to assess for acute abnormality.  She presented to ED that same evening due to additional episode of vertigo.  All imaging and work-up unremarkable.  She has continued on meclizine with benefit.  Denies additional vertigo/dizziness symptoms.    PLAN:  -Advised to use meclizine 25 mg twice daily as needed only at this time -Advised to call office with  any future vertigo or dizziness concerns -Continue aspirin 81 mg and pravastatin for secondary stroke prevention -advised ongoing refills and monitoring and management by PCP -Continue to follow with PCP for HTN and HLD management and secondary stroke risk factor management -Continue stay active and maintain a healthy diet -Advised to call office with any recurrent/worsening episodes or to call 911 for further evaluation  Follow-up in 6 months or call earlier if needed  Greater than 50% time during this 20-minute visit was spent on counseling and coordination of care about prior vertigo likely BPPV, discussion regarding decreasing use of meclizine and ongoing management of stroke risk factors.  Ihor Austin, AGNP-BC  [facility] [address] [place], [place] [address]  Phone [phone] Fax [phone] Note: This document was prepared with digital dictation and possible smart phrase technology. Any transcriptional errors that result from this process are unintentional.

## 2020-01-12 ENCOUNTER — Telehealth: Payer: Self-pay | Admitting: Adult Health

## 2020-01-12 NOTE — Telephone Encounter (Signed)
Patient called stating that she is having an extreme headache that has been ongoing for all day yesterday and today and has taken her medications as normal but is unsure what could be causing the issue.   Please follow up

## 2020-01-12 NOTE — Telephone Encounter (Signed)
I called and LMVM for pt to return call re: headaches.

## 2020-01-17 NOTE — Telephone Encounter (Signed)
I called pt and relayed that returning call.  Please call back if needed.  (she needs interpreter).

## 2020-01-18 ENCOUNTER — Telehealth: Payer: Self-pay | Admitting: Physician Assistant

## 2020-01-18 NOTE — Telephone Encounter (Signed)
Patient was transferred to [facility] for Triage on 01/18/20 at 3:17pm.  Patient states she is having pain in a vein on the right side of her neck.  States her bp is elevated.  Is having headaches and blurry vision.  Patient was advised to see PCP within 24 hours.  Patient is scheduled for 2/24 at 9:20am.

## 2020-01-19 ENCOUNTER — Ambulatory Visit (INDEPENDENT_AMBULATORY_CARE_PROVIDER_SITE_OTHER): Payer: Self-pay | Admitting: Physician Assistant

## 2020-01-19 ENCOUNTER — Other Ambulatory Visit: Payer: Self-pay

## 2020-01-19 ENCOUNTER — Encounter: Payer: Self-pay | Admitting: Physician Assistant

## 2020-01-19 VITALS — BP 138/80 | HR 68 | Temp 98.0°F | Ht 61.0 in | Wt 132.0 lb

## 2020-01-19 DIAGNOSIS — R519 Headache, unspecified: Secondary | ICD-10-CM

## 2020-01-19 DIAGNOSIS — I10 Essential (primary) hypertension: Secondary | ICD-10-CM

## 2020-01-19 DIAGNOSIS — E785 Hyperlipidemia, unspecified: Secondary | ICD-10-CM

## 2020-01-19 MED ORDER — PRAVASTATIN SODIUM 10 MG PO TABS: 10.0000 mg | ORAL_TABLET | Freq: Every day | ORAL | 1 refills | Status: DC

## 2020-01-19 MED ORDER — METOPROLOL TARTRATE 25 MG PO TABS: 12.5000 mg | ORAL_TABLET | Freq: Two times a day (BID) | ORAL | 1 refills | Status: DC

## 2020-01-19 NOTE — Patient Instructions (Signed)
It was great to see you!  1. May take 1000 mg tylenol up to 3 times daily for headache. Please call the neurologist for your headaches to review other options.  Neurologist: [phone]  2. Take metoprolol 12.5 mg (HALF A TABLET) two times daily.  3. Continue pravastatin 10 mg daily  Take care,  [name] PA-C

## 2020-01-19 NOTE — Telephone Encounter (Signed)
Routing to pcp Carla Flaming, MD Carla [name]

## 2020-01-19 NOTE — Progress Notes (Signed)
Carla [name] is a 49 y.o. female here for a follow up of a pre-existing problem.  I acted as a Education administrator for [employer], PA-C Anselmo Pickler, LPN  History of Present Illness:   Chief Complaint  Patient presents with  . Headache  . Hypertension   In person interpreter is here in office today to assist with our visit.  HPI   Headache Pt c/o headache every day for the past week.  She called the neurology office but did not receive a call back per her report.  Tylenol extra strength 1000 mg x 1 last night, eased up a little bit so she could sleep but still woke up with a headache. Denies: weakness on one side of the body, n/v, changes in vision, slurred speech.  Hypertension Pt c/o elevated blood pressure, has been checking at home running  190/88 on wrist, on arm is 130/80's. Haiving headache and some blurred vision and dizziness. She has not had breakfast. Has some burning in chest and SOB sometimes.  Her prescription is currently written for 12.5 mg metoprolol twice daily, but she is currently taking 25 mg metoprolol twice daily.  HLD Patient has ongoing confusion of which dosages of medications to take.  She is currently taking pravastatin 40 mg daily.  When I look at the bottle of this it is prescribed by Dr. Leory Plowman.  She is unable to tell me who this prescriber is.  Our records indicate that she is on pravastatin 10 mg daily.  After further discussion she reports that she had found this old bottle of 40 mg pravastatin at home and went to refill it because it did have refills remaining at the pharmacy.  As we discussed this possible medication error, she states that she thinks that its possible that this medication increase has been causing her headaches.   Past Medical History:  Diagnosis Date  . Depression   . Frequent headaches   . 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 Hyperlipidemia   . Hypertension   . Stroke [facility])    09/2015     Social History   Socioeconomic History  . Marital  status: Married    Spouse name: Not on file  . Number of children: Not on file  . Years of education: Not on file  . Highest education level: Not on file  Occupational History  . Occupation: Furniture conservator/restorer  Tobacco Use  . Smoking status: Never Smoker  . Smokeless tobacco: Never Used  Substance and Sexual Activity  . Alcohol use: No  . Drug use: No  . Sexual activity: Not Currently    Birth control/protection: None  Other Topics Concern  . Not on file  Social History Narrative   From [place]   Lived in [place] for 20 years, and been in [place] for 6 years   Husband and son, 75   Works: clean homes   Social Determinants of Radio broadcast assistant Strain:   . Difficulty of Paying Living Expenses: Not on file  Food Insecurity:   . Worried About Charity fundraiser in the Last Year: Not on file  . Ran Out of Food in the Last Year: Not on file  Transportation Needs:   . Lack of Transportation (Medical): Not on file  . Lack of Transportation (Non-Medical): Not on file  Physical Activity:   . Days of Exercise per Week: Not on file  . Minutes of Exercise per Session: Not on file  Stress:   .  Feeling of Stress : Not on file  Social Connections:   . Frequency of Communication with Friends and Family: Not on file  . Frequency of Social Gatherings with Friends and Family: Not on file  . Attends Religious Services: Not on file  . Active Member of Clubs or Organizations: Not on file  . Attends Archivist Meetings: Not on file  . Marital Status: Not on file  Intimate Partner Violence:   . Fear of Current or Ex-Partner: Not on file  . Emotionally Abused: Not on file  . Physically Abused: Not on file  . Sexually Abused: Not on file    Past Surgical History:  Procedure Laterality Date  . RIGHT ARM SURGERY    . TEE WITHOUT CARDIOVERSION N/A 10/13/2015   Procedure: TRANSESOPHAGEAL ECHOCARDIOGRAM (TEE);  Surgeon: Lelon Perla, MD;  Location: [facility] ENDOSCOPY;  Service:  Cardiovascular;  Laterality: N/A;    Family History  Problem Relation Age of Onset  . Leukemia Mother   . Colon cancer Brother   . Asthma Neg Hx   . Cancer Neg Hx   . Diabetes Neg Hx   . Hyperlipidemia Neg Hx   . Heart failure Neg Hx   . Hypertension Neg Hx   . Migraines Neg Hx   . Rashes / Skin problems Neg Hx   . Seizures Neg Hx   . Stroke Neg Hx   . Thyroid disease Neg Hx     Allergies  Allergen Reactions  . Lipitor [Atorvastatin Calcium] Swelling    throat  . Tramadol Nausea Only    Current Medications:   Current Outpatient Medications:  .  aspirin 81 MG tablet, Take 1 tablet (81 mg total) by mouth daily., Disp: 30 tablet, Rfl: 1 .  meclizine (ANTIVERT) 25 MG tablet, Take 1 tablet (25 mg total) by mouth 2 (two) times daily as needed for dizziness., Disp: 60 tablet, Rfl: 0 .  metoprolol tartrate (LOPRESSOR) 25 MG tablet, Take 0.5 tablets (12.5 mg total) by mouth 2 (two) times daily., Disp: 90 tablet, Rfl: 1 .  pravastatin (PRAVACHOL) 10 MG tablet, Take 1 tablet (10 mg total) by mouth daily., Disp: 90 tablet, Rfl: 1   Review of Systems:   ROS Negative unless otherwise specified per HPI.  Vitals:   Vitals:   01/19/20 0950  BP: 138/80  Pulse: 68  Temp: 98 F (36.7 C)  TempSrc: Temporal  SpO2: 98%  Weight: 132 lb (59.9 kg)  Height: 5\' 1"  (1.549 m)     Body mass index is 24.94 kg/m.  Physical Exam:   Physical Exam Vitals and nursing note reviewed.  Constitutional:      General: She is not in acute distress.    Appearance: She is well-developed. She is not ill-appearing or toxic-appearing.  Cardiovascular:     Rate and Rhythm: Normal rate and regular rhythm.     Pulses: Normal pulses.     Heart sounds: Normal heart sounds, S1 normal and S2 normal.     Comments: No LE edema Pulmonary:     Effort: Pulmonary effort is normal.     Breath sounds: Normal breath sounds.  Skin:    General: Skin is warm and dry.  Neurological:     Mental Status: She is  alert.     GCS: GCS eye subscore is 4. GCS verbal subscore is 5. GCS motor subscore is 6.  Psychiatric:        Speech: Speech normal.  Behavior: Behavior normal. Behavior is cooperative.     Assessment and Plan:   Aslan was seen today for headache and hypertension.  Diagnoses and all orders for this visit:  Essential hypertension Controlled.  I recommended that she stop using her wrist cuff as this is inaccurate and causing her anxiety.  I do recommend that she take her blood pressure medication as prescribed and we reviewed this twice.  Patient verbalized understanding was able to tell me all 4 medications that she is currently prescribed and when and how to take them.  Follow-up in 6 months, sooner if concerns.  Worsening headaches Discussed that she may take 1000 mg Tylenol up to 3 times daily if needed for pain.  I also recommended that she return the call to neurology to find out what else she can take.  No red flags on discussion and patient is in no acute distress at this time.  Hyperlipidemia, unspecified hyperlipidemia type Discussed with her importance of only getting current medications refilled or updates from current prescribers.  Will change her pravastatin back to 10 mg daily, she feels as though the higher dose may be causing her headaches.  Encourage compliance with medications.  Other orders -     pravastatin (PRAVACHOL) 10 MG tablet; Take 1 tablet (10 mg total) by mouth daily. -     metoprolol tartrate (LOPRESSOR) 25 MG tablet; Take 0.5 tablets (12.5 mg total) by mouth 2 (two) times daily.  . Reviewed expectations re: course of current medical issues. . Discussed self-management of symptoms. . Outlined signs and symptoms indicating need for more acute intervention. . Patient verbalized understanding and all questions were answered. . See orders for this visit as documented in the electronic medical record. . Patient received an After-Visit Summary.  CMA or  LPN served as scribe during this visit. History, Physical, and Plan performed by medical provider. The above documentation has been reviewed and is accurate and complete.   [name], PA-C

## 2020-02-29 ENCOUNTER — Encounter: Payer: Self-pay | Admitting: Physician Assistant

## 2020-02-29 ENCOUNTER — Ambulatory Visit (INDEPENDENT_AMBULATORY_CARE_PROVIDER_SITE_OTHER): Payer: Self-pay | Admitting: Physician Assistant

## 2020-02-29 ENCOUNTER — Other Ambulatory Visit: Payer: Self-pay

## 2020-02-29 VITALS — BP 140/86 | HR 88 | Temp 98.4°F | Ht 61.0 in | Wt 131.5 lb

## 2020-02-29 DIAGNOSIS — H9203 Otalgia, bilateral: Secondary | ICD-10-CM

## 2020-02-29 DIAGNOSIS — E785 Hyperlipidemia, unspecified: Secondary | ICD-10-CM

## 2020-02-29 DIAGNOSIS — H9192 Unspecified hearing loss, left ear: Secondary | ICD-10-CM

## 2020-02-29 NOTE — Progress Notes (Signed)
Carla [name] is a 49 y.o. female here for a new problem.  I acted as a Education administrator for [employer], PA-C Anselmo Pickler, LPN  History of Present Illness:   Chief Complaint  Patient presents with  . Ear Problem    HPI   Ear problem Pt c/o bilateral ear pain off and on x 1.5 months. Pt says she has drainage sometimes. Pt would like a referral to ENT. L ear symptoms >> R ear.   A few months ago, she was talking to her soon in his room and then she heard a "buzzing" sound in both of her ears. Felt like something "exploded" in her ear. Had residual pain in L ear. She felt "out of control" -- felt cold and unconscious -- did not actually lose consciousness; went to her husband afterwards and he held her for 30 minutes and her symptoms subsided.  She cleans her ears regularly with q-tips. Doesn't use headphones regularly. Decreased hearing in L ear x 4-5 years.  HLD Feels like the pravastatin is causing anxiety and shaking. She only took this for one month after seeing me last. She would like to update her lipid panel today.     Past Medical History:  Diagnosis Date  . Depression   . Frequent headaches   . 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 Hyperlipidemia   . Hypertension   . Stroke [facility])    09/2015     Social History   Socioeconomic History  . Marital status: Married    Spouse name: Not on file  . Number of children: Not on file  . Years of education: Not on file  . Highest education level: Not on file  Occupational History  . Occupation: Furniture conservator/restorer  Tobacco Use  . Smoking status: Never Smoker  . Smokeless tobacco: Never Used  Substance and Sexual Activity  . Alcohol use: No  . Drug use: No  . Sexual activity: Not Currently    Birth control/protection: None  Other Topics Concern  . Not on file  Social History Narrative   From [place]   Lived in [place] for 20 years, and been in [place] for 6 years   Husband and son, 18   Works: clean homes   Social Determinants of Adult nurse Strain:   . Difficulty of Paying Living Expenses:   Food Insecurity:   . Worried About Charity fundraiser in the Last Year:   . Arboriculturist in the Last Year:   Transportation Needs:   . Film/video editor (Medical):   Marland Kitchen Lack of Transportation (Non-Medical):   Physical Activity:   . Days of Exercise per Week:   . Minutes of Exercise per Session:   Stress:   . Feeling of Stress :   Social Connections:   . Frequency of Communication with Friends and Family:   . Frequency of Social Gatherings with Friends and Family:   . Attends Religious Services:   . Active Member of Clubs or Organizations:   . Attends Archivist Meetings:   Marland Kitchen Marital Status:   Intimate Partner Violence:   . Fear of Current or Ex-Partner:   . Emotionally Abused:   Marland Kitchen Physically Abused:   . Sexually Abused:     Past Surgical History:  Procedure Laterality Date  . RIGHT ARM SURGERY    . TEE WITHOUT CARDIOVERSION N/A 10/13/2015   Procedure: TRANSESOPHAGEAL ECHOCARDIOGRAM (TEE);  Surgeon: Lelon Perla, MD;  Location:  [facility] ENDOSCOPY;  Service: Cardiovascular;  Laterality: N/A;    Family History  Problem Relation Age of Onset  . Leukemia Mother   . Colon cancer Brother   . Asthma Neg Hx   . Cancer Neg Hx   . Diabetes Neg Hx   . Hyperlipidemia Neg Hx   . Heart failure Neg Hx   . Hypertension Neg Hx   . Migraines Neg Hx   . Rashes / Skin problems Neg Hx   . Seizures Neg Hx   . Stroke Neg Hx   . Thyroid disease Neg Hx     Allergies  Allergen Reactions  . Lipitor [Atorvastatin Calcium] Swelling    throat  . Tramadol Nausea Only    Current Medications:   Current Outpatient Medications:  .  aspirin 81 MG tablet, Take 1 tablet (81 mg total) by mouth daily., Disp: 30 tablet, Rfl: 1 .  meclizine (ANTIVERT) 25 MG tablet, Take 1 tablet (25 mg total) by mouth 2 (two) times daily as needed for dizziness., Disp: 60 tablet, Rfl: 0 .  metoprolol tartrate (LOPRESSOR) 25  MG tablet, Take 0.5 tablets (12.5 mg total) by mouth 2 (two) times daily., Disp: 90 tablet, Rfl: 1 .  pravastatin (PRAVACHOL) 10 MG tablet, Take 1 tablet (10 mg total) by mouth daily. (Patient not taking: Reported on 02/29/2020), Disp: 90 tablet, Rfl: 1   Review of Systems:   ROS  Negative unless otherwise specified per HPI.  Vitals:   Vitals:   02/29/20 1501  BP: 140/86  Pulse: 88  Temp: 98.4 F (36.9 C)  TempSrc: Temporal  SpO2: 96%  Weight: 131 lb 8 oz (59.6 kg)  Height: 5\' 1"  (1.549 m)     Body mass index is 24.85 kg/m.  Physical Exam:   Physical Exam Vitals and nursing note reviewed.  Constitutional:      General: She is not in acute distress.    Appearance: She is well-developed. She is not ill-appearing or toxic-appearing.  HENT:     Head: Normocephalic and atraumatic.     Right Ear: Tympanic membrane, ear canal and external ear normal. Tympanic membrane is not erythematous, retracted or bulging.     Left Ear: Tympanic membrane, ear canal and external ear normal. Tympanic membrane is not erythematous, retracted or bulging.  Eyes:     General: Lids are normal.     Conjunctiva/sclera: Conjunctivae normal.  Neck:     Trachea: Trachea normal.  Cardiovascular:     Rate and Rhythm: Normal rate and regular rhythm.     Heart sounds: Normal heart sounds, S1 normal and S2 normal.  Pulmonary:     Effort: Pulmonary effort is normal.     Breath sounds: Normal breath sounds. No decreased breath sounds, wheezing, rhonchi or rales.  Lymphadenopathy:     Cervical: No cervical adenopathy.  Skin:    General: Skin is warm and dry.  Neurological:     Mental Status: She is alert.  Psychiatric:        Speech: Speech normal.        Behavior: Behavior normal. Behavior is cooperative.     Hearing Screening   Method: Audiometry   125Hz  250Hz  500Hz  1000Hz  2000Hz  3000Hz  4000Hz  6000Hz  8000Hz   Right ear:           Left ear:           Comments: Audiometry Right: passed Left:   failed- defer    Assessment and Plan:   Carla [name] was seen today  for ear problem.  Diagnoses and all orders for this visit:  Hyperlipidemia, unspecified hyperlipidemia type She is not fasting and is concerned about her triglycerides. We will update a fasting lipid panel later this week and will advise further. She historically has significant issues with tolerating statins. -     Lipid panel; Future  Otalgia of both ears Referral to ENT per patient request. -     Ambulatory referral to ENT  . Reviewed expectations re: course of current medical issues. . Discussed self-management of symptoms. . Outlined signs and symptoms indicating need for more acute intervention. . Patient verbalized understanding and all questions were answered. . See orders for this visit as documented in the electronic medical record. . Patient received an After-Visit Summary.  CMA or LPN served as scribe during this visit. History, Physical, and Plan performed by medical provider. The above documentation has been reviewed and is accurate and complete.  [name], PA-C

## 2020-02-29 NOTE — Patient Instructions (Signed)
It was great to see you!  I will refer you to ENT for your symptoms.  Please make an appointment with the lab on your way out. I would like for you to return for lab work within 1-2 weeks. After midnight on the day of the lab draw, please do not eat anything. You may have water, black coffee, unsweetened tea.  Take care,  [name] PA-C

## 2020-03-07 ENCOUNTER — Encounter: Payer: Self-pay | Admitting: Physician Assistant

## 2020-03-07 ENCOUNTER — Other Ambulatory Visit (INDEPENDENT_AMBULATORY_CARE_PROVIDER_SITE_OTHER): Payer: Self-pay

## 2020-03-07 ENCOUNTER — Other Ambulatory Visit: Payer: Self-pay | Admitting: Physician Assistant

## 2020-03-07 ENCOUNTER — Other Ambulatory Visit: Payer: Self-pay

## 2020-03-07 DIAGNOSIS — E785 Hyperlipidemia, unspecified: Secondary | ICD-10-CM

## 2020-03-07 LAB — LIPID PANEL
Cholesterol: 195 mg/dL (ref 0–200)
HDL: 58.9 mg/dL (ref >39.00)
LDL Cholesterol: 121 mg/dL — ABNORMAL HIGH (ref 0–99)
NonHDL: 136.32
Total CHOL/HDL Ratio: 3
Triglycerides: 79 mg/dL (ref 0.0–149.0)
VLDL: 15.8 mg/dL (ref 0.0–40.0)

## 2020-03-07 MED ORDER — LOVASTATIN 20 MG PO TABS: 20.0000 mg | ORAL_TABLET | Freq: Every day | ORAL | 0 refills | Status: DC

## 2020-03-16 ENCOUNTER — Emergency Department (HOSPITAL_COMMUNITY): Payer: Self-pay

## 2020-03-16 ENCOUNTER — Encounter (HOSPITAL_COMMUNITY): Payer: Self-pay | Admitting: Emergency Medicine

## 2020-03-16 ENCOUNTER — Other Ambulatory Visit: Payer: Self-pay

## 2020-03-16 ENCOUNTER — Emergency Department (HOSPITAL_COMMUNITY)
Admission: EM | Admit: 2020-03-16 | Discharge: 2020-03-17 | Disposition: A | Discharge status: Home / Self Care | Payer: Self-pay | Attending: Emergency Medicine | Admitting: Emergency Medicine

## 2020-03-16 DIAGNOSIS — R200 Anesthesia of skin: Secondary | ICD-10-CM | POA: Insufficient documentation

## 2020-03-16 DIAGNOSIS — I493 Ventricular premature depolarization: Secondary | ICD-10-CM | POA: Insufficient documentation

## 2020-03-16 DIAGNOSIS — Z8616 Personal history of COVID-19: Secondary | ICD-10-CM | POA: Insufficient documentation

## 2020-03-16 DIAGNOSIS — R410 Disorientation, unspecified: Secondary | ICD-10-CM | POA: Insufficient documentation

## 2020-03-16 DIAGNOSIS — I10 Essential (primary) hypertension: Secondary | ICD-10-CM | POA: Insufficient documentation

## 2020-03-16 DIAGNOSIS — Z79899 Other long term (current) drug therapy: Secondary | ICD-10-CM | POA: Insufficient documentation

## 2020-03-16 HISTORY — DX: Dizziness and giddiness: R42

## 2020-03-16 HISTORY — DX: COVID-19: U07.1

## 2020-03-16 LAB — COMPREHENSIVE METABOLIC PANEL
ALT: 25 U/L (ref 0–44)
AST: 24 U/L (ref 15–41)
Albumin: 4.4 g/dL (ref 3.5–5.0)
Alkaline Phosphatase: 66 U/L (ref 38–126)
Anion gap: 9 (ref 5–15)
BUN: 12 mg/dL (ref 6–20)
CO2: 25 mmol/L (ref 22–32)
Calcium: 9.4 mg/dL (ref 8.9–10.3)
Chloride: 106 mmol/L (ref 98–111)
Creatinine, Ser: 0.93 mg/dL (ref 0.44–1.00)
GFR calc Af Amer: >60 mL/min (ref >60)
GFR calc non Af Amer: >60 mL/min (ref >60)
Glucose, Bld: 97 mg/dL (ref 70–99)
Potassium: 4 mmol/L (ref 3.5–5.1)
Sodium: 140 mmol/L (ref 135–145)
Total Bilirubin: 0.9 mg/dL (ref 0.3–1.2)
Total Protein: 7.5 g/dL (ref 6.5–8.1)

## 2020-03-16 LAB — PROTIME-INR
INR: 1.1 (ref 0.8–1.2)
Prothrombin Time: 13.7 seconds (ref 11.4–15.2)

## 2020-03-16 LAB — CBC WITH DIFFERENTIAL/PLATELET
Abs Immature Granulocytes: 0.02 10*3/uL (ref 0.00–0.07)
Basophils Absolute: 0.1 10*3/uL (ref 0.0–0.1)
Basophils Relative: 1 %
Eosinophils Absolute: 0.1 10*3/uL (ref 0.0–0.5)
Eosinophils Relative: 1 %
HCT: 43.7 % (ref 36.0–46.0)
Hemoglobin: 14.4 g/dL (ref 12.0–15.0)
Immature Granulocytes: 0 %
Lymphocytes Relative: 30 %
Lymphs Abs: 2.4 10*3/uL (ref 0.7–4.0)
MCH: 30.3 pg (ref 26.0–34.0)
MCHC: 33 g/dL (ref 30.0–36.0)
MCV: 91.8 fL (ref 80.0–100.0)
Monocytes Absolute: 0.4 10*3/uL (ref 0.1–1.0)
Monocytes Relative: 5 %
Neutro Abs: 4.9 10*3/uL (ref 1.7–7.7)
Neutrophils Relative %: 63 %
Platelets: 359 10*3/uL (ref 150–400)
RBC: 4.76 MIL/uL (ref 3.87–5.11)
RDW: 12.2 % (ref 11.5–15.5)
WBC: 7.9 10*3/uL (ref 4.0–10.5)
nRBC: 0 % (ref 0.0–0.2)

## 2020-03-16 LAB — ETHANOL: Alcohol, Ethyl (B): <10 mg/dL (ref <10)

## 2020-03-16 LAB — APTT: aPTT: 30 seconds (ref 24–36)

## 2020-03-16 NOTE — ED Provider Notes (Signed)
[facility] EMERGENCY DEPARTMENT Provider Note   CSN: [id number] Arrival date & time: 03/16/20  1539     History Chief Complaint  Patient presents with  . Altered Mental Status    Carla [name] is a 49 y.o. female.  Patient with PMH of stroke, HL, HTN, PVCs, headaches, presents to the emergency department with a chief complaint of confusion.  She states that she was taking her child to school today, but could not remember what she able to take him to. She also reports that she has had left arm numbness x2 days. She takes aspirin 81 mg daily.  She denies any slurred speech.  She denies any new vision changes.  She denies any other associated symptoms.  Denies any weakness of her extremities, noting only with numbness in the left arm.  She denies having had any chest pain, but states that she has had some palpitations.  She denies any shortness of breath.  She states that complicated by significant she has had some recent pain in the back of her head present.    The history is provided by the patient. No language interpreter was used.       Past Medical History:  Diagnosis Date  . COVID-19   . Depression   . Frequent headaches   . 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 Hyperlipidemia   . Hypertension   . Stroke ([place])    09/2015  . Vertigo     Patient Active Problem List   Diagnosis Date Noted  . Essential hypertension 07/05/2019  . Left knee pain 01/08/2018  . 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 Premature ventricular contractions 03/25/2016  . Hyperlipidemia 03/25/2016  . Benign paroxysmal positional vertigo 03/11/2016  . Neck pain on right side 02/19/2016  . Cryptogenic stroke ([place]) 12/28/2015  . Multiple hemangiomas   . Cerebral infarction due to unspecified mechanism   . 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 Migraines 10/11/2015    Past Surgical History:  Procedure Laterality Date  . RIGHT ARM SURGERY    . TEE WITHOUT CARDIOVERSION N/A 10/13/2015   Procedure: TRANSESOPHAGEAL ECHOCARDIOGRAM (TEE);  Surgeon: Lelon Perla, MD;  Location: [facility] ENDOSCOPY;  Service: Cardiovascular;  Laterality: N/A;     OB History    Gravida  2   Para  2   Term  1   Preterm  1   AB  0   Living  2     SAB  0   TAB  0   Ectopic  0   Multiple  0   Live Births  2           Family History  Problem Relation Age of Onset  . Leukemia Mother   . Colon cancer Brother   . Asthma Neg Hx   . Cancer Neg Hx   . Diabetes Neg Hx   . Hyperlipidemia Neg Hx   . Heart failure Neg Hx   . Hypertension Neg Hx   . Migraines Neg Hx   . Rashes / Skin problems Neg Hx   . Seizures Neg Hx   . Stroke Neg Hx   . Thyroid disease Neg Hx     Social History   Tobacco Use  . Smoking status: Never Smoker  . Smokeless tobacco: Never Used  Substance Use Topics  . Alcohol use: No  . Drug use: No    Home Medications Prior to Admission medications   Medication Sig Start Date End Date Taking? Authorizing Provider  aspirin 81 MG tablet Take 1 tablet (81 mg total) by mouth daily. 12/30/18   Frann Rider, NP  lovastatin (MEVACOR) 20 MG tablet Take 1 tablet (20 mg total) by mouth at bedtime. 03/07/20   [name], PA  meclizine (ANTIVERT) 25 MG tablet Take 1 tablet (25 mg total) by mouth 2 (two) times daily as needed for dizziness. 01/04/20   Frann Rider, NP  metoprolol tartrate (LOPRESSOR) 25 MG tablet Take 0.5 tablets (12.5 mg total) by mouth 2 (two) times daily. 01/19/20 04/18/20  [name], PA    Allergies    Lipitor [atorvastatin calcium], Pravastatin, and Tramadol  Review of Systems   Review of Systems  All other systems reviewed and are negative.   Physical Exam Updated Vital Signs BP 137/80   Pulse 62   Temp 97.8 F (36.6 C)   Resp 16   Ht 5\' 1"  (1.549 m)   Wt 59.4 kg   SpO2 100%   BMI 24.75 kg/m   Physical Exam Vitals and nursing note reviewed.  Constitutional:      General: She is not in acute distress.     Appearance: She is well-developed.  HENT:     Head: Normocephalic and atraumatic.  Eyes:     Conjunctiva/sclera: Conjunctivae normal.  Cardiovascular:     Rate and Rhythm: Normal rate and regular rhythm.     Heart sounds: No murmur.  Pulmonary:     Effort: Pulmonary effort is normal. No respiratory distress.     Breath sounds: Normal breath sounds.  Abdominal:     Palpations: Abdomen is soft.     Tenderness: There is no abdominal tenderness.  Musculoskeletal:     Cervical back: Neck supple.  Skin:    General: Skin is warm and dry.  Neurological:     Mental Status: She is alert and oriented to person, place, and time.     Comments: Questionable faint facial asymmetry when smiling, otherwise cranial nerves are intact, no objective numbness to the extremities, strength of extremities is 5/5 throughout, normal finger to nose, no pronator drift, normal gait  Psychiatric:        Mood and Affect: Mood normal.        Behavior: Behavior normal.     ED Results / Procedures / Treatments   Labs (all labs ordered are listed, but only abnormal results are displayed) Labs Reviewed  CBC WITH DIFFERENTIAL/PLATELET  COMPREHENSIVE METABOLIC PANEL  ETHANOL  PROTIME-INR  APTT  RAPID URINE DRUG SCREEN, HOSP PERFORMED  URINALYSIS, ROUTINE W REFLEX MICROSCOPIC  I-STAT BETA HCG BLOOD, ED ([facility], [facility], [facility] ONLY)    EKG EKG Interpretation  Date/Time:  Thursday March 16 2020 15:45:10 EDT Ventricular Rate:  86 PR Interval:  134 QRS Duration: 80 QT Interval:  344 QTC Calculation: 411 R Axis:   60 Text Interpretation: Normal sinus rhythm Normal ECG Confirmed by Noemi Chapel [phone]) on 03/16/2020 10:26:38 PM   Radiology CT HEAD WO CONTRAST  Result Date: 03/16/2020 CLINICAL DATA:  Intermittent confusion, has happened while driving EXAM: CT HEAD WITHOUT CONTRAST TECHNIQUE: Contiguous axial images were obtained from the base of the skull through the vertex without intravenous contrast. COMPARISON:   CT 11/06/2019 FINDINGS: Brain: No evidence of acute infarction, hemorrhage, hydrocephalus, extra-axial collection or mass lesion/mass effect. Vascular: No hyperdense vessel or unexpected calcification. Skull: No calvarial fracture or suspicious osseous lesion. No scalp swelling or hematoma. Benign scalp and dermal calcifications. Sinuses/Orbits: Paranasal sinuses and mastoid air cells are predominantly clear. Pneumatization  of the right petrous apex. Included orbital structures are unremarkable. Other: Mild left TMJ arthrosis. IMPRESSION: 1. No acute intracranial findings. Electronically Signed   By: Lovena Le M.D.   On: 03/16/2020 22:55   MR BRAIN WO CONTRAST  Result Date: 03/17/2020 CLINICAL DATA:  Initial evaluation for acute confusion, headache. EXAM: MRI HEAD WITHOUT CONTRAST TECHNIQUE: Multiplanar, multiecho pulse sequences of the brain and surrounding structures were obtained without intravenous contrast. COMPARISON:  Prior CT from 03/16/2020. FINDINGS: Brain: Cerebral volume stable, and remains within normal limits for age. Few scattered subcentimeter foci of T2/FLAIR hyperintensity noted involving the supratentorial cerebral white matter, nonspecific, but most likely related to chronic small vessel ischemic disease, minimal for age. No abnormal foci of restricted diffusion to suggest subacute ischemia. Gray-white matter differentiation maintained. No encephalomalacia to suggest chronic cortical infarction. No foci of susceptibility artifact to suggest acute or chronic intracranial hemorrhage. No mass lesion, midline shift or mass effect. No hydrocephalus or extra-axial fluid collection. Pituitary gland suprasellar region normal. Midline structures intact. Vascular: Major intracranial vascular flow voids are maintained. Skull and upper cervical spine: Craniocervical junction within normal limits. Bone marrow signal intensity normal. No scalp soft tissue abnormality. Sinuses/Orbits: Globes and orbital  soft tissues within normal limits. Paranasal sinuses are largely clear. Left-to-right nasal septal deviation noted. No mastoid effusion. Inner ear structures normal. Other: None. IMPRESSION: 1. No acute intracranial abnormality. 2. Mild scattered cerebral white matter changes, nonspecific, but most like related chronic small vessel ischemic disease, minimal for age. Electronically Signed   By: Jeannine Boga M.D.   On: 03/17/2020 01:06    Procedures Procedures (including critical care time)  Medications Ordered in ED Medications - No data to display  ED Course  I have reviewed the triage vital signs and the nursing notes.  Pertinent labs & imaging results that were available during my care of the patient were reviewed by me and considered in my medical decision making (see chart for details).    MDM Rules/Calculators/A&P                      This patient complains of confusion today (forgetting where her child goes to school) and left arm numbness x 2 days; this involves an extensive number of treatment options, and is a complaint that carries with it a high risk of complications and morbidity.  The differential diagnosis includes stroke, encephalopathy.  I ordered, reviewed, and interpreted labs, which included basic labs, all of which are reassuring. I ordered imaging studies which included CT head to assess for evidence of acute stroke, but this was negative.  I personally viewed and interpreted the CT results and agree with the radiologist interpretation.  Given the patient's numbness and prior stroke, I proceeded with ordering an MRI of the brain, which showed no acute intracranial abnormality, but did show mild scattered cerebral white matter changes which are nonspecific.  I have advised the patient to follow-up with her neurologist.  She requests a second opinion, and I provided her with contact information for a different neurology group.  I discussed the case with Dr. Leonette Monarch,  who agrees that patient can be safely discharged from the emergency department at this time without any further emergent work-up.  I have strongly encouraged patient to follow-up on an outpatient basis.  She is neurovascularly intact at this time.  Her symptoms seem to all be subjective.  She does not have any definite new focal deficit.  Previous records obtained and  reviewed prior stroke, thought to have been embolic in nature back in 2016 also prior diagnosis of migraine headaches.  I am more suspicious that her symptoms could be coming from her migraines, and could be complex migraine.  She is feeling better now.  After the interventions stated above, I reevaluated the patient and found her feeling improved.  She is in no acute distress.  I find her stable for out patient follow-up and discharge from the emergency department at this time.   Final Clinical Impression(s) / ED Diagnoses Final diagnoses:  Numbness  Confusion    Rx / DC Orders ED Discharge Orders         Ordered    Ambulatory referral to Neurology    Comments: An appointment is requested in approximately: 2 weeks   03/17/20 0235           Montine Circle, PA-C 03/17/20 0240    Noemi Chapel, MD 03/17/20 1459

## 2020-03-16 NOTE — ED Triage Notes (Addendum)
Pt st's for 2 days she has had a period of confusion  That last for about 50 mins then it clears.   Pt st's it has happened when she is driving.   Pt is alert and oriented x's 4 at this time.   Pt c/o slight headache  Pt was positive for Covid in Aug. 2020

## 2020-03-17 ENCOUNTER — Telehealth (HOSPITAL_COMMUNITY): Payer: Self-pay | Admitting: Neurology

## 2020-03-17 ENCOUNTER — Emergency Department (HOSPITAL_COMMUNITY): Payer: Self-pay

## 2020-03-17 LAB — I-STAT BETA HCG BLOOD, ED (MC, WL, AP ONLY): I-stat hCG, quantitative: <5 m[IU]/mL (ref <5)

## 2020-03-17 NOTE — Telephone Encounter (Signed)
I returned call from patient to [facility] answering service and spoke to her.  She stated she had a bad headache last night and went to the ER MRI scan of the brain was obtained which showed no acute abnormalities.  She was treated with like a migraine.  Today she still had some headache and took some Tylenol which seems to be helping.  I advised her to call me back in case the headache got worse or persisted.  She was also advised to call the office to schedule a follow-up appointment for her migraines.  She voiced understanding

## 2020-03-17 NOTE — Discharge Instructions (Signed)
No clear cause of your symptoms was found tonight.  We recommend that you be seen by your neurologist.  Please call and make an appointment.  I've also attached the contact information for another neurologist per your request.

## 2020-03-21 ENCOUNTER — Encounter: Payer: Self-pay | Admitting: Adult Health

## 2020-03-21 ENCOUNTER — Ambulatory Visit (INDEPENDENT_AMBULATORY_CARE_PROVIDER_SITE_OTHER): Payer: Self-pay | Admitting: Adult Health

## 2020-03-21 ENCOUNTER — Other Ambulatory Visit: Payer: Self-pay

## 2020-03-21 VITALS — BP 128/83 | HR 90 | Temp 97.2°F | Wt 129.0 lb

## 2020-03-21 DIAGNOSIS — G43009 Migraine without aura, not intractable, without status migrainosus: Secondary | ICD-10-CM

## 2020-03-21 DIAGNOSIS — F411 Generalized anxiety disorder: Secondary | ICD-10-CM

## 2020-03-21 DIAGNOSIS — I10 Essential (primary) hypertension: Secondary | ICD-10-CM

## 2020-03-21 DIAGNOSIS — G459 Transient cerebral ischemic attack, unspecified: Secondary | ICD-10-CM

## 2020-03-21 DIAGNOSIS — E785 Hyperlipidemia, unspecified: Secondary | ICD-10-CM

## 2020-03-21 MED ORDER — AMITRIPTYLINE HCL 10 MG PO TABS: 10.0000 mg | ORAL_TABLET | Freq: Every day | ORAL | 3 refills | Status: DC

## 2020-03-21 MED ORDER — OMEGA-3 FATTY ACIDS 1000 MG PO CAPS: 2.0000 g | ORAL_CAPSULE | Freq: Every day | ORAL | Status: DC

## 2020-03-21 NOTE — Patient Instructions (Addendum)
Your Plan:  Recommend starting amitriptyline 10mg  nightly for migraine management and can also help with anxiety which may be contributing or worsening symptoms  Highly recommend stress reduction techniques which may help with increased anxiety especially as your headache symptoms start - may benefit from counseling or therapy as well   Recommend Omega 3 to help with cholesterol support as well as diety changes - follow up with your PCP in regards to further management   If not benefit over the next week, please call office for possible need of increase  If unable to tolerate, please call office and dosage can be lowered  Recommend being seen by eye doctor to ensure satisfactory vision which may lead to worsening headaches with eye strain     Follow up in 3 months or call earlier if needed     Thank you for coming to see [place] at [facility]. I hope we have been able to provide you high quality care today.  You may receive a patient satisfaction survey over the next few weeks. We would appreciate your feedback and comments so that we may continue to improve ourselves and the health of our patients.    Amitriptyline tablets Qu es este medicamento? La AMITRIPTILINA se utiliza para tratar la depresin. Este medicamento puede ser utilizado para otros usos; si tiene alguna pregunta consulte con su proveedor de atencin mdica o con su farmacutico. MARCAS COMUNES: Elavil, Vanatrip Qu le debo informar a mi profesional de la salud antes de tomar este medicamento? Necesita saber si usted presenta alguno de los siguientes problemas o situaciones:  problemas de alcoholismo  asma, dificultad al respirar  trastorno bipolar o esquizofrenia  dificultad para orinar, problema de prstata  glaucoma  enfermedad cardiaca o ataque cardiaco previo  enfermedad heptica  hipertiroidismo  convulsiones  ideas o planes suicidos, intentos de suicidio previos o  antecendentes familiares de intentos de suicidio  una reaccin alrgica o inusual a la amitriptilina, otros medicamentos, alimentos, colorantes o conservadores  si est embarazada o buscando quedar embarazada  si est amamantando a un beb Cmo debo utilizar este medicamento? [employer] medicamento por va oral con un poco de [place]. Siga las instrucciones de la etiqueta del [place]. Puede tomar las tabletas con o sin alimentos. Tome su medicamento a intervalos regulares. No lo tome con una frecuencia mayor a la indicada. No deje de tomar Coca[employer]Cola de repente a menos que as lo indique su mdico. Dejar de Insurance account manager medicamento demasiado rpido puede causar efectos secundarios graves o podra empeorar su afeccin. Su farmacutico le dar una Gua del medicamento especial (MedGuide, nombre en ingls) con cada receta y en cada ocasin que la vuelva a surtir. Asegrese de leer esta informacin cada vez cuidadosamente. Hable con su pediatra para informarse acerca del uso de este medicamento en nios. Puede requerir atencin especial. Sobredosis: Pngase en contacto inmediatamente con un centro toxicolgico o una sala de urgencia si usted cree que haya tomado demasiado medicamento. ATENCIN: [employer] es solo para usted. No comparta este medicamento con nadie. Qu sucede si me olvido de una dosis? Si olvida una dosis, tmela lo antes posible. Si es casi la hora de la prxima dosis, tome slo esa dosis. No tome dosis adicionales o dobles. Qu puede interactuar con este medicamento? No use este medicamento con ninguno de los siguientes frmacos: trixido de arsnico ciertos medicamentos usados para regular la frecuencia cardiaca anormal o tratar otras afecciones cardiacas cisaprida droperidol halofantrina linezolida IMAO, tales como  Carbex, Eldepryl, Marplan, [place]il y Parnate azul de metileno otros medicamentos para la depresin mental fenotiazinas, tales como perfenacina, tioridazina  y Chief of Staff pimozida probucol procarbazina esparfloxacino hierba de [place] Este medicamento tambin puede Counselling psychologist con los siguientes frmacos: atropina y frmacos relacionados, tales como hiosciamina, escopolamina, tolterodina y otros barbitricos para inducir el sueo o para el tratamiento de convulsiones, tal como el fenobarbital cimetidina disulfirm etclorvinol hormonas tiroideas, como levotiroxina ziprasidona Puede ser que esta lista no menciona todas las posibles interacciones. Informe a su profesional de [employer] Home	[place] de [employer] productos a base de hierbas, medicamentos de [place] o suplementos nutritivos que est tomando. Si usted fuma, consume bebidas alcohlicas o si utiliza drogas ilegales, indqueselo tambin a su profesional de KB Home	[place]. Algunas sustancias pueden interactuar con su medicamento. A qu debo estar atento al usar Coca-Cola? Informe a su mdico si sus sntomas no mejoran o si empeoran. Visite a su mdico o a su profesional de la salud para chequear su evolucin peridicamente. Debido que puede ser necesario tomar este medicamento durante varias semanas para que sea posible observar sus efectos en forma [place], es importante que sigue su tratamiento como recetado por su mdico. Los pacientes y sus familias deben estar atentos si empeora la depresin o ideas suicidas. Tambin est atento a cambios repentinos o severos de emocin, tales como el sentirse ansioso, agitado, lleno de pnico, irritable, hostil, agresivo, impulsivo, inquietud severa, demasiado excitado y hiperactivo o dificultad para conciliar el sueo. Si esto ocurre, especialmente al comenzar con un tratamiento antidepresivo o al cambiar de dosis, comunquese con su profesional de KB Home	[place]. Puede experimentar mareos o somnolencia. No conduzca ni utilice maquinaria ni haga nada que Associate Professor en estado de alerta hasta que sepa cmo le afecta este medicamento. No se siente ni se ponga de pie con  rapidez, especialmente si es un paciente de edad avanzada. Esto reduce el riesgo de mareos o [employer]. El alcohol puede interferir con el efecto de este medicamento. Evite consumir bebidas alcohlicas. No se trate usted mismo si tiene tos, resfro o Set designer sin Teacher, adult education con su mdico o con su profesional de KB Home	[place]. Algunos ingredientes pueden aumentar los posibles efectos secundarios. Se le podr secar la boca. Masticar chicle sin azcar, chupar caramelos duros y tomar agua en abundancia le ayudar a mantener la boca hmeda. Si el problema no desaparece o es severo, consulte a su mdico. Este medicamento puede resecarle los ojos y provocar visin borrosa. Si [place] lentes de contacto, puede sentir ciertas molestias. Las gotas lubricantes pueden ser tiles. Si el problema no desaparece o es severo, consulte con su mdico de los ojos. Este medicamento causar estreimiento. Trate de evacuar los intestinos al menos cada 2  3 das. Si no evacua los intestinos durante 3 das, comunquese con su mdico o con su profesional de [employer] Home	[place]. Este medicamento puede aumentar la sensibilidad al sol. Mantngase fuera de Administrator. Si no lo puede evitar, utilice ropa protectora y crema de Photographer. No utilice lmparas solares, camas solares ni cabinas solares. Qu efectos secundarios puedo tener al [employer] este medicamento? Efectos secundarios que debe informar a su mdico o a Barrister's clerk de la salud tan pronto como sea posible: Chief of Staff, como erupcin cutnea, comezn/picazn o urticarias, e hinchazn de la cara, los labios o la lengua ansiedad problemas respiratorios cambios en la visin confusin estado de nimo elevado, menor necesidad de dormir, pensamientos acelerados, conducta impulsiva dolor ocular ritmo cardiaco rpido, irregular sensacin  de [employer] o aturdimiento, cadas sensacin de agitacin, enojo o irritabilidad fiebre con aumento de la sudoracin alucinaciones, prdida del  contacto con la realidad convulsiones rigidez de los msculos ideas suicidas u otros cambios en el estado de nimo hormigueo, Social research officer, government o entumecimiento de los pies o las manos dificultad para Garment/textile technologist o cambios en el volumen de orina dificultad para conciliar el sueo cansancio o debilidad inusual vmito color amarillento de los ojos o la piel Efectos secundarios que generalmente no requieren atencin mdica (infrmelos a su mdico o a Barrister's clerk de la salud si persisten o si son molestos): cambios en el deseo o desempeo sexual cambios en el apetito o el peso estreimiento mareos boca seca nuseas cansancio temblores Higher education careers adviser Puede ser que esta lista no menciona todos los posibles efectos secundarios. Comunquese a su mdico por asesoramiento mdico [employer]. Usted puede informar los efectos secundarios a la FDA por telfono al 1-800-FDA-1088. Dnde debo guardar mi medicina? Mantngala fuera del alcance de los nios. Gurdela a [employer], entre 20 y 43 grados C (25 y 74 grados F). Deseche todo el medicamento que no haya utilizado, despus de su fecha de vencimiento. ATENCIN: Este folleto es un resumen. Puede ser que no cubra toda la posible informacin. Si usted tiene preguntas acerca de esta medicina, consulte con su mdico, su farmacutico o su profesional de Technical sales engineer.  2020 Elsevier/Gold Standard (2019-07-06 00:00:00)

## 2020-03-21 NOTE — Progress Notes (Signed)
[name] [address] [place]. [place] [address] [phone]       OFFICE FOLLOW UP NOTE  Carla. Carla [name] Date of Birth:  [birth year] Medical Record Number:  [id number]   [facility] provider: Dr. Pearlean Brownie Reason for Referral: Recurrence of migraines  Chief Complaint  Patient presents with  . Follow-up    rm 9, with son, TIA, Migraine, pt stopped lovastatin     HPI:  Today, 03/21/2020, Carla [name] is being seen today accompanied by her son who assist with interpretation due to recent emergency room visit with left arm numbness and confusion accompanied by headache on 03/16/2020.  MRI brain negative for acute intracranial abnormality.  She was diagnosed with complex migraines as symptoms objective and no focal deficit identified.  She was advised to follow-up outpatient for further evaluation.  Prior medical history of cryptogenic stroke 2016, TIA, vertigo and migraines.  Previously treated in this office for migraine management on Topamax 25 mg twice daily but self discontinued due to difficulty tolerating.  She has not had any difficulties with migraines in over the past year and has not required any treatment.  Typical migraines recurred approximately 2 weeks ago located right temporal and periorbital area with pulsating quality with blurred vision, photophobia, phonophobia and nausea.  Symptoms consistent with previously known migraines.  She may also experience pain in occipital region.  She has been experiencing migraines every other day which last up until 2 hours after laying down and resting.  She has not had any recurrence of confusion or numbness/tingling.  She also reports symptoms of fatigue, dizziness, and shortness of breath which appears to be anxiety related per son.  She also recently self discontinued lovastatin due to subjective increased anxiety after taking.  Previously discussed underlying anxiety at prior visits which likely contributing and/or worsening  symptoms but she has previously declined further management or counseling.  She has continued on aspirin 81 mg daily without bleeding or bruising.  Blood pressure today 128/83.  No further concerns at this time.     HISTORY SUMMARY: Initial visit 09/29/2018 Dr. Pearlean Brownie:  Carla [name] is a pleasant 45 year Hispanic lady originally from [place] who was seen today for evaluation for headaches.  She is accompanied by her friend as well as Spanish language interpreter who provides translation for this visit.  History is obtained from them, review of electronic medical records and have personally reviewed imaging films.  Patient has been complaining of increasing headaches for the last 2 months.  She describes this headache as being right temporal and frontal mostly throbbing in nature though fluctuating in severity from 6/10 to 10/10.  Headache is accompanied by nausea vomiting light and sound sensitivity.  She has to stop her activities and lie down and sleeping as well as taking Tylenol seems to help.  Headaches are accompanied by a single vision spots and scintillations and blurred vision.  She denies any speech difficulties, vertigo, diplopia, gait or balance difficulties with headaches.  Headaches usually last about 30 to 40 minutes.  Headaches of late have been occurring about 2-3 times per week.  She does give a remote history of infrequent headaches which sound like migraine headaches and was similar but were mostly perimenstrual and not bad.  The patient also states in September 2019 she had a episode of speech difficulties as well as dizziness.  She was seen by primary care physician who states that the patient had a headache as well as feeling of dizziness  and some speech difficulties.  This happened following a stressful event.  An outpatient MRI was ordered which was done on 08/05/2018 which I personally reviewed shows a few nonspecific white matter hyperintensities but no acute abnormality or definite  stroke.  Patient states her symptoms improved within a few days but the headaches have persisted since then.  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 MRA of the brain showed slight diminished peripheral branches in the left MCA but no large vessel stenosis or occlusion. Carotid ultrasound showed no significant extracranial stenosis. Transthoracic echo showed normal ejection fraction. Transesophageal echocardiogram showed no cardiac source of embolism or PFO. Antiphospholipid antibodies and lupus anticoagulant were both negative. Lipid profile showed elevated total cholesterol 207 and LDL 139 mg percent. Hemoglobin A1c was 5.6. Patient was started on aspirin as well as protocol. She states her speech difficulty and right-sided weakness completely improved.  She was placed on aspirin and pravastatin which she actually did well on but for unclear reason she discontinued both medications.  She is currently on Lipitor 40 which was started a few months ago by primary physician but she is complaining of dizziness on this medication and wants to change it or stop it.  She states she had slight balance difficulties from her previous stroke but had recovered well from that.  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  She states her blood pressure is under good control.  Last lipid profile checked in August 2019 showed LDL of 120 following which Lipitor was added.  I had recommended doing a transcranial Doppler bubble study at last visit with me in 2017 but for some unclear reason this was never done.  Update 12/30/2018: Carla [name] is being seen today for follow-up visit for migraines and is accompanied by interpreter as patient is primarily Spanish-speaking.  Upon initial visit with Dr. Pearlean Brownie on  09/29/2018 (visit info below), headaches were not frequent enough to justify initiating migraine prophylaxis.  She did call back on 12/02/2018 reporting daily migraines and therefore initiated Topamax 25 mg twice daily.  She took this medication for 4 days but discontinued as medication was making her too fatigued.  She consider taking 1 tab at night only but wanted to speak with [place] in regards to this first.  She was evaluated in the ED on 12/19/2018 and returned on 12/23/2018 due to vaginal bleeding.  Apparently, all medications discontinued after 12/23/2018 visit and was started on Provera for 10-day course with recommending follow-up with OB/GYN for further evaluation.  She reports today that her headaches have improved and not experience a headache since ED admission on 12/23/2018.  She does have an appointment scheduled at [facility] on 01/12/2019 for further evaluation.  She denies any continued vaginal bleeding at this time.  Blood pressure today satisfactory 130/68. It was recommended after prior visit to undergo transcranial Doppler with bubble study to assess for possible PFO.  This was obtained on 10/14/2018 and was negative for PFO.  Update 06/21/2019 : She returns for follow-up after last visit 5 months ago.  She is accompanied by a [place] language interpreter who was present throughout the visit and interprets for her.  Patient states her main complaint today is that her migraine headaches seem to have come back.  In the last  3 months she has had increased frequency of headaches which occur 3 to 4 days/week.  The headache starts off as a throbbing pain around her right temple as well as periorbital region becomes unbearable 10/10 in severity.  She has relied on.  Light and sound do worsen the headache.  She does not throw up or feel nauseous.  She does not have visual symptoms with the headaches.  Headache may last for most of the day.  She is unable to identify specific triggers for headaches but  does feel tightness in the back of her head and neck muscles.  She has not tried triptans or prescription migraine specific medications she has tried taking Tylenol and ibuprofen without relief.  She has been taking naproxen which seems to work better.  She has been taking them 3 to 4 days a week.  She was previously on Topamax but when she started having dysfunctional uterine bleeding all the medications were stopped including Topamax.  She is unable to tell me if she had any bad side effects on it.  Patient recently changed her primary care physician and a new physician assistant discontinued protocol for unclear reason.  She remains on aspirin which is tolerating well without bruising or bleeding.  Her blood pressure is under good control and today it is 142/83.  She has had no recurrent stroke or TIA symptoms.  Update 09/02/2019: Carla [name] is being seen today for 65-month follow-up regarding migraine headaches accompanied by interpreter.  After prior visit, Topamax 25 mg daily was restarted due to worsening migraines as well as Relpax for emergent relief.  She did not tolerate Topamax as she reports this caused weight loss and short-term memory loss.  She was unable to afford Relpax.  She has recently been experiencing migraines 2 times weekly and typically lasts for 1 hour total but during this time, migraines are debilitating and continues to be located right temporal area.  She has not trialed any other type of migraine medication.  Recent diagnosis of COVID-19 in 06/2019 and feels as though she has not completely recovered mentally and physically.  After prior visit, lipid panel obtained and recommended restart pravastatin but due to side effects, PCP initiated Crestor.  She reports difficulty tolerating Crestor but plans on following up with PCP for further monitoring and management.  Blood pressure today 135/91.  Update 11/04/2019: Carla [name] is a 49 year old female who is being seen today by patient  request due to concerns of balance difficulties concern for stroke.  She is accompanied today by a interpreter.  She did not seek medical treatment at onset.  Approximately 1.5 weeks ago, she states she started experiencing intermittent balance impairment with a swaying type sensation and room spinning.  Symptoms severe where she will have to hold onto something and close her eyes.  She states she experienced same type of symptoms with prior stroke.  She denies associated headache, neck pain, speech difficulty, visual changes, weakness or numbness/tingling.  She denies increased stress or anxiety/depression.  She states standing straight up or laying flat or with head movement induces the symptoms.  She denies currently feeling symptoms at today's visit.  She does endorse recently stopping her metoprolol 3 days ago due to increased stress and irritability symptoms.  She does monitor blood pressure at home and SBP 130s.  She is unable to endorse diastolic pressures or heart rates.  Blood pressure today satisfactory at 132/96.  He does endorse continuation of aspirin 81 mg daily and  recently started on pravastatin approximately 3 weeks ago by PCP.  Update 11/09/2019: Carla [name] is a 49 year old female who is being seen today by patient request due to recent ED admission on 11/06/2019 for ongoing concerns of dizziness previously evaluated in office 5 days ago.  She is accompanied by interpreter.  Per review of ED note, she reported worsening dizziness and headache 2 days prior to ED admission concerning for stroke.  CT head and MRI brain no acute abnormalities.  She was advised to follow-up with PCP and neurology at discharge.  She has been doing well since discharge without any reoccurring vertigo or dizziness symptoms or headaches.  She has continued to take meclizine with benefit 25 mg tablets twice daily.  She continues on aspirin 81 mg daily and pravastatin for secondary stroke prevention without side effects.  Blood pressure today 124/71.  No further concerns at this time.  Update 01/04/2020: Carla [name] is a 49 year old female who is being seen today for follow-up regarding prior concerns of dizziness accompanied by interpreter.  Denies residual/dizziness symptoms.  Continues twice daily meclizine with occasional fatigue.  Continues on aspirin 81 mg daily and pravastatin for secondary stroke prevention without side effects.  Blood pressure today 127/78.  Mild short-term memory loss but endorses ongoing improvement.  No concerns at this time.    ROS:   14 system review of systems is positive for see HPI and all other systems negative  PMH:  Past Medical History:  Diagnosis Date  . COVID-19   . Depression   . Frequent headaches   . 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 Hyperlipidemia   . Hypertension   . Stroke (HCC)    09/2015  . Vertigo     Social History:  Social History   Socioeconomic History  . Marital status: Married    Spouse name: Not on file  . Number of children: Not on file  . Years of education: Not on file  . Highest education level: Not on file  Occupational History  . Occupation: Financial trader  Tobacco Use  . Smoking status: Never Smoker  . Smokeless tobacco: Never Used  Substance and Sexual Activity  . Alcohol use: No  . Drug use: No  . Sexual activity: Not Currently    Birth control/protection: None  Other Topics Concern  . Not on file  Social History Narrative   From [place]   Lived in [place] for 20 years, and been in [place] for 6 years   Husband and son, 61   Works: clean homes   Social Determinants of Corporate investment banker Strain:   . Difficulty of Paying Living Expenses:   Food Insecurity:   . Worried About Programme researcher, broadcasting/film/video in the Last Year:   . Barista in the Last Year:   Transportation Needs:   . Freight forwarder (Medical):   Marland Kitchen Lack of Transportation (Non-Medical):   Physical Activity:   . Days of Exercise per Week:   . Minutes of  Exercise per Session:   Stress:   . Feeling of Stress :   Social Connections:   . Frequency of Communication with Friends and Family:   . Frequency of Social Gatherings with Friends and Family:   . Attends Religious Services:   . Active Member of Clubs or Organizations:   . Attends Banker Meetings:   Marland Kitchen Marital Status:   Intimate Partner Violence:   . Fear of Current or Ex-Partner:   .  Emotionally Abused:   Marland Kitchen Physically Abused:   . Sexually Abused:     Medications:   Current Outpatient Medications on File Prior to Visit  Medication Sig Dispense Refill  . aspirin 81 MG tablet Take 1 tablet (81 mg total) by mouth daily. 30 tablet 1  . meclizine (ANTIVERT) 25 MG tablet Take 1 tablet (25 mg total) by mouth 2 (two) times daily as needed for dizziness. 60 tablet 0  . metoprolol tartrate (LOPRESSOR) 25 MG tablet Take 0.5 tablets (12.5 mg total) by mouth 2 (two) times daily. 90 tablet 1   No current facility-administered medications on file prior to visit.    Allergies:   Allergies  Allergen Reactions  . Lipitor [Atorvastatin Calcium] Swelling    throat  . Pravastatin Anxiety  . Tramadol Nausea Only    Physical Exam  Today's Vitals   03/21/20 0907  BP: 128/83  Pulse: 90  Temp: (!) 97.2 F (36.2 C)  Weight: 129 lb (58.5 kg)   Body mass index is 24.37 kg/m.  Physical exam General: well developed, well nourished, pleasant middle-aged Hispanic lady, seated, in no apparent distress Head: head normocephalic and atraumatic.   Neck: supple with no carotid or supraclavicular bruits Cardiovascular: regular rate and rhythm, no murmurs Musculoskeletal: no deformity Skin:  no rash/petichiae Vascular:  Normal pulses all extremities  Neurologic Exam Mental Status: awake and fully alert.  Primarily Spanish-speaking and person, no speech or language impairment.  Oriented to place and time. Recent and remote memory intact. Attention span, concentration and fund of  knowledge appropriate. Mood and affect appropriate Cranial Nerves: Pupils equal, briskly reactive to light.  Extraocular movements full without nystagmus. Visual fields full to confrontation with subjective blurriness (chronic). Hearing intact. Facial sensation intact. Face, tongue, palate moves normally and symmetrically.   Motor: Normal bulk and tone. Normal strength in all tested extremity muscles. Sensory.: intact to touch , pinprick , position and vibratory sensation.  Coordination: Rapid alternating movements normal in all extremities. Finger-to-nose and heel-to-shin performed accurately bilaterally. Gait and Station: Arises from chair without difficulty. Stance is normal. Gait demonstrates normal stride length and balance . Able to heel, toe and tandem walk without difficulty.  Romberg negative. Reflexes: 1+ and symmetric. Toes downgoing.      ASSESSMENT:  Carla [name] is a 49 year old Hispanic lady with with PMH of cryptogenic stroke 2016, TIA, HDL, HTN, BPPV, anxiety and migraines.  Being seen today per patient request due to recent onset of recurrent migraines.  She has not had any difficulty with migraines and over the past year but unfortunately has been experiencing recurrent migraines over the past 2 to 3 weeks.  Recent ED admission with MRI no acute abnormalities    PLAN:  -Initiate amitriptyline 10 mg nightly for reoccurrence of migraines with debilitating symptoms.  Advised this medication may also help with underlying anxiety.   -Advised multiple other symptoms as described in HPI likely due to underlying anxiety -Highly encouraged stress relaxation techniques and likely benefit of counseling -Continue aspirin 81 mg and start omega-3 for secondary stroke prevention.  Prior difficulty tolerating multiple other cholesterol management medications and at this time, recommend use of omega-3 and to follow-up with PCP for further monitoring and management -Continue to follow with PCP  for HTN and HLD management and secondary stroke risk factor management -Continue stay active and maintain a healthy diet -Advised to call office with any recurrent/worsening episodes or to call 911 for further evaluation   Follow-up in 3 months  or call earlier if needed  I spent 35 minutes of face-to-face and non-face-to-face time with patient and son who assisted with interpretation.  This included previsit chart review, lab review, study review, order entry, electronic health record documentation, patient education regarding recurrent migraine episodes, ongoing untreated anxiety likely contributing to multiple complaints/symptoms, and answered all questions to patient and son satisfaction    Ihor Austin, AGNP-BC  [facility] [address] [place], [place] [address]  Phone [phone] Fax [phone] Note: This document was prepared with digital dictation and possible smart phrase technology. Any transcriptional errors that result from this process are unintentional.

## 2020-03-27 NOTE — Progress Notes (Signed)
I agree with the above plan 

## 2020-04-06 ENCOUNTER — Telehealth: Payer: Self-pay | Admitting: Adult Health

## 2020-04-06 NOTE — Telephone Encounter (Signed)
[place], Clinical cytogeneticist in Sports coach with pt Carla [name], she translated) daughter in law states pt is having a migraine and she would like to know what she can take, please call pt back(daughter in law Carla [name] will be there to translate to pt) RN can call the [phone]

## 2020-04-06 NOTE — Telephone Encounter (Signed)
I called and spoke thru her daughter in law as interpreter, that pt had has migraine since last night.  Taking excedrin not helping.  Having, nausea, pain in her legs (achy, weakness bilateral, level 10 with nausea.  I relayed that since her sx are high level with nausea, not sure of the bilateral leg weakness achiness, that I would seek care at urgent care/pcp even ED to evaluate.  (may require injection/infusion) for treatment.  Could increase the amitriptyline to 20mg  po qhs but this would not help as rescue /abortive medication.  Daughter in law verbalized understanding.

## 2020-04-11 ENCOUNTER — Other Ambulatory Visit: Payer: Self-pay

## 2020-04-11 ENCOUNTER — Telehealth: Payer: Self-pay | Admitting: Physician Assistant

## 2020-04-11 DIAGNOSIS — R42 Dizziness and giddiness: Secondary | ICD-10-CM

## 2020-04-11 NOTE — Telephone Encounter (Signed)
ENT referral placed.

## 2020-04-11 NOTE — Telephone Encounter (Signed)
[place] for ENT referral for vertigo

## 2020-04-11 NOTE — Telephone Encounter (Signed)
Received call that pt is requesting a referral to an ENT for her vertigo. Please advise.

## 2020-05-25 ENCOUNTER — Other Ambulatory Visit: Payer: Self-pay | Admitting: Adult Health

## 2020-05-25 NOTE — Telephone Encounter (Signed)
Pt is requesting a refill for meclizine (ANTIVERT) 25 MG tablet . ? ?Pharmacy:  [id number]  ? ?

## 2020-05-28 ENCOUNTER — Other Ambulatory Visit: Payer: Self-pay | Admitting: Physician Assistant

## 2020-05-30 ENCOUNTER — Other Ambulatory Visit: Payer: Self-pay

## 2020-05-30 NOTE — Telephone Encounter (Signed)
Is it okay to refill her meclizine  Janett Billow is out of office  Please advise

## 2020-06-01 ENCOUNTER — Other Ambulatory Visit: Payer: Self-pay

## 2020-06-01 MED ORDER — MECLIZINE HCL 25 MG PO TABS: 25.0000 mg | ORAL_TABLET | Freq: Two times a day (BID) | ORAL | 0 refills | Status: DC | PRN

## 2020-06-01 NOTE — Telephone Encounter (Signed)
Refill done and manage by Janett Billow NP. Refill done until pts appt in august 2021.

## 2020-06-02 ENCOUNTER — Telehealth: Payer: Self-pay | Admitting: Physician Assistant

## 2020-06-02 MED ORDER — METOPROLOL TARTRATE 25 MG PO TABS: 12.5000 mg | ORAL_TABLET | Freq: Two times a day (BID) | ORAL | 1 refills | Status: DC

## 2020-06-02 NOTE — Telephone Encounter (Signed)
..   LAST APPOINTMENT DATE: 05/28/2020   NEXT APPOINTMENT DATE:@Visit  date not found  MEDICATION:metoprolol tartrate (LOPRESSOR) 25 MG tablet(Expired)  PHARMACY:[id number] - [place], [place] [facility] [address]  **Let patient know to contact pharmacy at the end of the day to make sure medication is ready. **  ** Please notify patient to allow 48-72 hours to process**  **Encourage patient to contact the pharmacy for refills or they can request refills through [facility]**  CLINICAL FILLS OUT ALL BELOW:   LAST REFILL:  QTY:  REFILL DATE:    OTHER COMMENTS:    Okay for refill?  Please advise

## 2020-06-02 NOTE — Telephone Encounter (Signed)
Pt notified Rx sent to pharmacy as requested. 

## 2020-06-13 ENCOUNTER — Encounter (HOSPITAL_COMMUNITY): Payer: Self-pay

## 2020-06-13 ENCOUNTER — Emergency Department (HOSPITAL_COMMUNITY): Payer: Medicaid Other

## 2020-06-13 ENCOUNTER — Emergency Department (HOSPITAL_COMMUNITY)
Admission: EM | Admit: 2020-06-13 | Discharge: 2020-06-14 | Disposition: A | Discharge status: Home / Self Care | Payer: Medicaid Other | Attending: Emergency Medicine | Admitting: Emergency Medicine

## 2020-06-13 ENCOUNTER — Other Ambulatory Visit: Payer: Self-pay

## 2020-06-13 DIAGNOSIS — M79602 Pain in left arm: Secondary | ICD-10-CM | POA: Insufficient documentation

## 2020-06-13 DIAGNOSIS — R519 Headache, unspecified: Secondary | ICD-10-CM | POA: Insufficient documentation

## 2020-06-13 DIAGNOSIS — I10 Essential (primary) hypertension: Secondary | ICD-10-CM | POA: Insufficient documentation

## 2020-06-13 DIAGNOSIS — R202 Paresthesia of skin: Secondary | ICD-10-CM

## 2020-06-13 DIAGNOSIS — Z7982 Long term (current) use of aspirin: Secondary | ICD-10-CM | POA: Insufficient documentation

## 2020-06-13 DIAGNOSIS — R0789 Other chest pain: Secondary | ICD-10-CM | POA: Insufficient documentation

## 2020-06-13 LAB — CBC
HCT: 40.4 % (ref 36.0–46.0)
Hemoglobin: 13.2 g/dL (ref 12.0–15.0)
MCH: 29.8 pg (ref 26.0–34.0)
MCHC: 32.7 g/dL (ref 30.0–36.0)
MCV: 91.2 fL (ref 80.0–100.0)
Platelets: 320 10*3/uL (ref 150–400)
RBC: 4.43 MIL/uL (ref 3.87–5.11)
RDW: 12.2 % (ref 11.5–15.5)
WBC: 7 10*3/uL (ref 4.0–10.5)
nRBC: 0 % (ref 0.0–0.2)

## 2020-06-13 LAB — BASIC METABOLIC PANEL
Anion gap: 9 (ref 5–15)
BUN: 9 mg/dL (ref 6–20)
CO2: 27 mmol/L (ref 22–32)
Calcium: 9.4 mg/dL (ref 8.9–10.3)
Chloride: 106 mmol/L (ref 98–111)
Creatinine, Ser: 0.85 mg/dL (ref 0.44–1.00)
GFR calc Af Amer: >60 mL/min (ref >60)
GFR calc non Af Amer: >60 mL/min (ref >60)
Glucose, Bld: 124 mg/dL — ABNORMAL HIGH (ref 70–99)
Potassium: 3.7 mmol/L (ref 3.5–5.1)
Sodium: 142 mmol/L (ref 135–145)

## 2020-06-13 LAB — TROPONIN I (HIGH SENSITIVITY)
Troponin I (High Sensitivity): 4 ng/L (ref <18)
Troponin I (High Sensitivity): 5 ng/L (ref <18)

## 2020-06-13 LAB — I-STAT BETA HCG BLOOD, ED (MC, WL, AP ONLY): I-stat hCG, quantitative: <5 m[IU]/mL (ref <5)

## 2020-06-13 NOTE — ED Triage Notes (Signed)
Pt reports one week of left arm numbness, grip strength equal. Pt also reports 2 days of right sided chest pain and headache. No dizziness or vision changes. Pt a.o

## 2020-06-14 LAB — D-DIMER, QUANTITATIVE: D-Dimer, Quant: 0.43 ug/mL-FEU (ref 0.00–0.50)

## 2020-06-14 MED ORDER — NAPROXEN 500 MG PO TABS
500.0000 mg | ORAL_TABLET | Freq: Two times a day (BID) | ORAL | 0 refills | Status: DC
Start: 2020-06-14 — End: 2021-07-12

## 2020-06-14 NOTE — ED Provider Notes (Signed)
[facility] EMERGENCY DEPARTMENT Provider Note   CSN: [id number] Arrival date & time: 06/13/20  1411   History Chief Complaint  Patient presents with  . Numbness  . Chest Pain  . Headache    Lillah Alf is a 49 y.o. female.  The history is provided by the patient. A language interpreter was used.  Chest Pain Associated symptoms: headache   Headache She has history of hypertension, hyperlipidemia, stroke and comes in with 2 main complaints.  She has been having some pain and numbness in her left arm for the last 2 weeks.  From last 2 days, she has had pain in the right side of her chest.  Chest pain is worse with deep breath.  She denies cough or fever.  There has been some dyspnea and some nausea and diaphoresis.  She has not had any vomiting.  She has not tried any treatment at home.  She is not actually complaining of a headache to me, even though that is one of the triage complaints.  Past Medical History:  Diagnosis Date  . COVID-19   . Depression   . Frequent headaches   . 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 Hyperlipidemia   . Hypertension   . Stroke ([place])    09/2015  . Vertigo     Patient Active Problem List   Diagnosis Date Noted  . Essential hypertension 07/05/2019  . Left knee pain 01/08/2018  . 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 Premature ventricular contractions 03/25/2016  . Hyperlipidemia 03/25/2016  . Benign paroxysmal positional vertigo 03/11/2016  . Neck pain on right side 02/19/2016  . Cryptogenic stroke ([place]) 12/28/2015  . Multiple hemangiomas   . Cerebral infarction due to unspecified mechanism   . 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 Migraines 10/11/2015    Past Surgical History:  Procedure Laterality Date  . RIGHT ARM SURGERY    . TEE WITHOUT CARDIOVERSION N/A 10/13/2015   Procedure: TRANSESOPHAGEAL ECHOCARDIOGRAM (TEE);  Surgeon: Lelon Perla, MD;  Location: [facility] ENDOSCOPY;  Service: Cardiovascular;  Laterality: N/A;     OB  History    Gravida  2   Para  2   Term  1   Preterm  1   AB  0   Living  2     SAB  0   TAB  0   Ectopic  0   Multiple  0   Live Births  2           Family History  Problem Relation Age of Onset  . Leukemia Mother   . Colon cancer Brother   . Asthma Neg Hx   . Cancer Neg Hx   . Diabetes Neg Hx   . Hyperlipidemia Neg Hx   . Heart failure Neg Hx   . Hypertension Neg Hx   . Migraines Neg Hx   . Rashes / Skin problems Neg Hx   . Seizures Neg Hx   . Stroke Neg Hx   . Thyroid disease Neg Hx     Social History   Tobacco Use  . Smoking status: Never Smoker  . Smokeless tobacco: Never Used  Vaping Use  . Vaping Use: Never used  Substance Use Topics  . Alcohol use: No  . Drug use: No    Home Medications Prior to Admission medications   Medication Sig Start Date End Date Taking? Authorizing Provider  amitriptyline (ELAVIL) 10 MG tablet Take 1 tablet (10 mg total) by mouth at bedtime.  03/21/20   Frann Rider, NP  aspirin 81 MG tablet Take 1 tablet (81 mg total) by mouth daily. 12/30/18   Frann Rider, NP  fish oil-omega-3 fatty acids 1000 MG capsule Take 2 capsules (2 g total) by mouth daily. 03/21/20   Frann Rider, NP  meclizine (ANTIVERT) 25 MG tablet Take 1 tablet (25 mg total) by mouth 2 (two) times daily as needed for dizziness. 06/01/20   Frann Rider, NP  metoprolol tartrate (LOPRESSOR) 25 MG tablet Take 0.5 tablets (12.5 mg total) by mouth 2 (two) times daily. 06/02/20 08/31/20  [name], PA    Allergies    Lipitor [atorvastatin calcium], Pravastatin, and Tramadol  Review of Systems   Review of Systems  Cardiovascular: Positive for chest pain.  Neurological: Positive for headaches.  All other systems reviewed and are negative.   Physical Exam Updated Vital Signs BP (!) 140/91 (BP Location: Right Arm)   Pulse 76   Temp 98.1 F (36.7 C) (Oral)   Resp 16   Ht 5\' 1"  (1.549 m)   Wt 61.7 kg   SpO2 99%   BMI 25.70 kg/m   Physical  Exam Vitals and nursing note reviewed.   49 year old female, resting comfortably and in no acute distress. Vital signs are significant for borderline elevated blood pressure. Oxygen saturation is 99%, which is normal. Head is normocephalic and atraumatic. PERRLA, EOMI. Oropharynx is clear. Neck is nontender and supple without adenopathy or JVD. Back is nontender and there is no CVA tenderness. Lungs are clear without rales, wheezes, or rhonchi. Chest is nontender. Heart has regular rate and rhythm without murmur. Abdomen is soft, flat, nontender without masses or hepatosplenomegaly and peristalsis is normoactive. Extremities have no cyanosis or edema, full range of motion is present. Skin is warm and dry without rash. Neurologic: Mental status is normal, cranial nerves are intact, there are no motor or sensory deficits.  ED Results / Procedures / Treatments   Labs (all labs ordered are listed, but only abnormal results are displayed) Labs Reviewed  BASIC METABOLIC PANEL - Abnormal; Notable for the following components:      Result Value   Glucose, Bld 124 (*)    All other components within normal limits  CBC  D-DIMER, QUANTITATIVE (NOT AT [facility])  I-STAT BETA HCG BLOOD, ED ([facility], [facility], [facility] ONLY)  TROPONIN I (HIGH SENSITIVITY)  TROPONIN I (HIGH SENSITIVITY)    EKG EKG Interpretation  Date/Time:  Tuesday June 13 2020 14:21:07 EDT Ventricular Rate:  96 PR Interval:  144 QRS Duration: 82 QT Interval:  340 QTC Calculation: 429 R Axis:   63 Text Interpretation: Normal sinus rhythm Normal ECG When compared with ECG of 03/16/2020, HEART RATE has increased Confirmed by Delora Fuel ([id number]) on 06/13/2020 11:00:06 PM   Radiology DG Chest 2 View  Result Date: 06/13/2020 CLINICAL DATA:  Chest pain for 1 day, history of COVID-19, hypertension and stroke EXAM: CHEST - 2 VIEW COMPARISON:  Radiograph 10/01/2019, CT 09/27/2015 FINDINGS: No consolidation, features of edema, pneumothorax, or  effusion. The cardiomediastinal contours are unremarkable. Asymmetric demineralization of the right humeral head is similar to comparison. No acute osseous or soft tissue abnormalities. IMPRESSION: No active cardiopulmonary disease. Chronic asymmetric demineralization of the right shoulder. Could reflect prior injury. Electronically Signed   By: Lovena Le M.D.   On: 06/13/2020 15:11   CT Head Wo Contrast  Result Date: 06/13/2020 CLINICAL DATA:  Long-term neuro defect, possible stroke EXAM: CT HEAD WITHOUT CONTRAST TECHNIQUE: Contiguous  axial images were obtained from the base of the skull through the vertex without intravenous contrast. COMPARISON:  None. FINDINGS: Brain: No evidence of acute infarction, hemorrhage, hydrocephalus, extra-axial collection or mass lesion/mass effect. Vascular: No hyperdense vessel or unexpected calcification. Skull: Normal. Negative for fracture or focal lesion. Sinuses/Orbits: No acute finding. Other: None. IMPRESSION: No acute intracranial abnormality noted. Electronically Signed   By: Inez Catalina M.D.   On: 06/13/2020 16:08    Procedures Procedures   Medications Ordered in ED Medications - No data to display  ED Course  I have reviewed the triage vital signs and the nursing notes.  Pertinent labs & imaging results that were available during my care of the patient were reviewed by me and considered in my medical decision making (see chart for details).  MDM Rules/Calculators/A&P Left arm pain and numbness and pattern strongly suggestive of cervical radiculopathy.  No actual neurologic deficit identified.  Chest pain concerning for possible pulmonary embolism.  No chest wall tenderness to suggest musculoskeletal pain.  ECG is normal and chest x-ray is normal.  She had also been sent for CT of head which was normal.  Troponin is normal x2, making ACS unlikely.  Will check D-dimer.  Old records are reviewed, and she has no relevant past visits.  D-dimer is  normal.  Will treat for presumed cervical radiculopathy with naproxen, follow-up with PCP in 1 week.  Final Clinical Impression(s) / ED Diagnoses Final diagnoses:  Pain in left arm  Paresthesia of left arm  Atypical chest pain    Rx / DC Orders ED Discharge Orders         Ordered    naproxen (NAPROSYN) 500 MG tablet  2 times daily     Discontinue  Reprint     06/14/20 4098           Delora Fuel, MD [birth year] [phone]

## 2020-06-14 NOTE — ED Notes (Signed)
Pt reports having intermittent chest pain for x2 days to right side and x2 weeks to left side, pt denies chest pain at this time. Pt reports taking daily 81mg  aspirin and medication for hypertension. Pt denies SOB, lightheadedness, dizziness, n/v.

## 2020-06-19 ENCOUNTER — Other Ambulatory Visit: Payer: Self-pay

## 2020-06-19 ENCOUNTER — Encounter: Payer: Self-pay | Admitting: Physician Assistant

## 2020-06-19 ENCOUNTER — Ambulatory Visit (INDEPENDENT_AMBULATORY_CARE_PROVIDER_SITE_OTHER): Payer: Medicaid Other | Admitting: Physician Assistant

## 2020-06-19 VITALS — BP 122/90 | HR 89 | Temp 98.2°F | Ht 61.0 in | Wt 133.4 lb

## 2020-06-19 DIAGNOSIS — R21 Rash and other nonspecific skin eruption: Secondary | ICD-10-CM

## 2020-06-19 DIAGNOSIS — R079 Chest pain, unspecified: Secondary | ICD-10-CM

## 2020-06-19 MED ORDER — TRIAMCINOLONE ACETONIDE 0.1 % EX CREA
TOPICAL_CREAM | CUTANEOUS | 0 refills | Status: DC
Start: 2020-06-19 — End: 2021-10-17

## 2020-06-19 NOTE — Progress Notes (Signed)
Carla [name] is a 49 y.o. female is here for a hospitalization follow up.  I acted as a Education administrator for [employer], PA-C [employer], [place]  History of Present Illness:   Chief Complaint  Patient presents with  . Hospitalization Follow-up  . Hyperlipidemia  . Hypertension    HPI  ED follow up Pt presented to the ED on 06/13/20 for HA, chest pain and numbness. Pt has a history of HTN, HLD, and stroke.   In the ER Carla [name] had a negative D-dimer, EKG, chest xray, CT scan of head.  Carla [name] has no symptoms currently, they have all resolved.  Carla [name] had COVID-14 July 2019. Carla [name] is wondering if Carla [name] should get the COVID-19 vaccine.  Pt currently takes Aspirin 81 daily, Metoprolol 25 MG daily, Fenofibrate daily. Pt currently takes Amitriptyline 10 mg for headaches. Denies HA, SOB, and chest pain.  Rash Itchy red and bumpy rash on her knuckles on her left hand.  Carla [name] states that Carla [name] has recently started cleaning with a more abrasive product.  Carla [name] cleans houses for living.  Denies any open areas or pain.  Carla [name] has not tried anything for her symptoms.  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    Health Maintenance Due  Topic Date Due  . TETANUS/TDAP  Never done  . PAP SMEAR-Modifier  04/09/2019    Past Medical History:  Diagnosis Date  . COVID-19   . Depression   . Frequent headaches   . 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 Hyperlipidemia   . Hypertension   . Stroke ([place])    09/2015  . Vertigo      Social History   Tobacco Use  . Smoking status: Never Smoker  . Smokeless tobacco: Never Used  Vaping Use  . Vaping Use: Never used  Substance Use Topics  . Alcohol use: No  . Drug use: No    Past Surgical History:  Procedure Laterality Date  . RIGHT ARM SURGERY    . TEE WITHOUT CARDIOVERSION N/A 10/13/2015   Procedure: TRANSESOPHAGEAL ECHOCARDIOGRAM (TEE);  Surgeon: Lelon Perla, MD;  Location: [facility] ENDOSCOPY;  Service: Cardiovascular;  Laterality: N/A;    Family History    Problem Relation Age of Onset  . Leukemia Mother   . Colon cancer Brother   . Asthma Neg Hx   . Cancer Neg Hx   . Diabetes Neg Hx   . Hyperlipidemia Neg Hx   . Heart failure Neg Hx   . Hypertension Neg Hx   . Migraines Neg Hx   . Rashes / Skin problems Neg Hx   . Seizures Neg Hx   . Stroke Neg Hx   . Thyroid disease Neg Hx     PMHx, SurgHx, SocialHx, FamHx, Medications, and Allergies were reviewed in the Visit Navigator and updated as appropriate.   Patient Active Problem List   Diagnosis Date Noted  . Essential hypertension 07/05/2019  . Left knee pain 01/08/2018  . 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 Premature ventricular contractions 03/25/2016  . Hyperlipidemia 03/25/2016  . Benign paroxysmal positional vertigo 03/11/2016  . Neck pain on right side 02/19/2016  . Cryptogenic stroke ([place]) 12/28/2015  . Multiple hemangiomas   . Cerebral infarction due to unspecified mechanism   . 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 Migraines 10/11/2015    Social History   Tobacco Use  . Smoking status: Never Smoker  . Smokeless tobacco: Never Used  Vaping Use  . Vaping Use: Never used  Substance Use Topics  . Alcohol use: No  . Drug use: No    Current Medications and Allergies:    Current Outpatient Medications:  .  amitriptyline (ELAVIL) 10 MG tablet, Take 1 tablet (10 mg total) by mouth at bedtime., Disp: 90 tablet, Rfl: 3 .  ASPIRIN LOW DOSE 81 MG EC tablet, Take 81 mg by mouth daily., Disp: , Rfl:  .  fenofibrate 54 MG tablet, Take 54 mg by mouth daily. with food, Disp: , Rfl:  .  fish oil-omega-3 fatty acids 1000 MG capsule, Take 2 capsules (2 g total) by mouth daily., Disp:  , Rfl:  .  meclizine (ANTIVERT) 25 MG tablet, Take 1 tablet (25 mg total) by mouth 2 (two) times daily as needed for dizziness., Disp: 60 tablet, Rfl: 0 .  metoprolol tartrate (LOPRESSOR) 25 MG tablet, Take 0.5 tablets (12.5 mg total) by mouth 2 (two) times daily., Disp: 90 tablet, Rfl: 1 .   naproxen (NAPROSYN) 500 MG tablet, Take 1 tablet (500 mg total) by mouth 2 (two) times daily., Disp: 30 tablet, Rfl: 0 .  triamcinolone cream (KENALOG) 0.1 %, Apply to affected area 1-2 times daily, Disp: 30 g, Rfl: 0   Allergies  Allergen Reactions  . Lipitor [Atorvastatin Calcium] Swelling    throat  . Pravastatin Anxiety  . Tramadol Nausea Only    Review of Systems   ROS Negative unless otherwise specified per HPI.  Vitals:   Vitals:   06/19/20 1528  BP: (!) 122/90  Pulse: 89  Temp: 98.2 F (36.8 C)  TempSrc: Temporal  SpO2: 95%  Weight: 133 lb 6.4 oz (60.5 kg)  Height: 5\' 1"  (1.549 m)     Body mass index is 25.21 kg/m.   Physical Exam:    Physical Exam Vitals and nursing note reviewed.  Constitutional:      General: Carla [name] is not in acute distress.    Appearance: Carla [name] is well-developed. Carla [name] is not ill-appearing or toxic-appearing.  Cardiovascular:     Rate and Rhythm: Normal rate and regular rhythm.     Pulses: Normal pulses.     Heart sounds: Normal heart sounds, S1 normal and S2 normal.     Comments: No LE edema Pulmonary:     Effort: Pulmonary effort is normal.     Breath sounds: Normal breath sounds.  Skin:    General: Skin is warm and dry.     Comments: Erythematous excoriations to left PIP joints on second third and fourth fingers without evidence of open areas, no tenderness  Neurological:     Mental Status: Carla [name] is alert.     GCS: GCS eye subscore is 4. GCS verbal subscore is 5. GCS motor subscore is 6.  Psychiatric:        Speech: Speech normal.        Behavior: Behavior normal. Behavior is cooperative.      Assessment and Plan:    Carla [name] was seen today for hospitalization follow-up, hyperlipidemia and hypertension.  Diagnoses and all orders for this visit:  Chest pain, unspecified type Symptoms have completely resolved.  We reviewed her medications with the in person interpreter today and Carla [name] is taking everything correctly.  Continue current  medications and follow-up as needed.  Rash Suspect irritant dermatitis.  Will trial topical Kenalog.  Good Rx card provided today.  I also recommended that Carla [name] purchase more industrial gloves to help prevent worsening skin breakdown.  Other orders -     triamcinolone cream (KENALOG) 0.1 %;  Apply to affected area 1-2 times daily  . Reviewed expectations re: course of current medical issues. . Discussed self-management of symptoms. . Outlined signs and symptoms indicating need for more acute intervention. . Patient verbalized understanding and all questions were answered. . See orders for this visit as documented in the electronic medical record. . Patient received an After Visit Summary.  CMA or LPN served as scribe during this visit. History, Physical, and Plan performed by medical provider. The above documentation has been reviewed and is accurate and complete.  [name], PA-C [place], [place] 06/19/2020  Follow-up: No follow-ups on file.

## 2020-06-19 NOTE — Patient Instructions (Signed)
It was great to see you!  Schedule your COVID-19 vaccine! [email or web address]  If ointment is too expensive, may purchase something similar to this:     Take care,  [name] PA-C

## 2020-06-27 ENCOUNTER — Ambulatory Visit: Payer: Self-pay | Admitting: Adult Health

## 2020-06-27 ENCOUNTER — Encounter: Payer: Self-pay | Admitting: Adult Health

## 2020-06-27 VITALS — BP 140/80 | HR 94 | Ht 64.0 in | Wt 134.0 lb

## 2020-06-27 DIAGNOSIS — Z8673 Personal history of transient ischemic attack (TIA), and cerebral infarction without residual deficits: Secondary | ICD-10-CM

## 2020-06-27 DIAGNOSIS — G43009 Migraine without aura, not intractable, without status migrainosus: Secondary | ICD-10-CM

## 2020-06-27 DIAGNOSIS — G44209 Tension-type headache, unspecified, not intractable: Secondary | ICD-10-CM

## 2020-06-27 MED ORDER — AMITRIPTYLINE HCL 25 MG PO TABS: 25.0000 mg | ORAL_TABLET | Freq: Every day | ORAL | 3 refills | Status: DC

## 2020-06-27 MED ORDER — VENLAFAXINE HCL ER 37.5 MG PO CP24: 37.5000 mg | ORAL_CAPSULE | Freq: Every day | ORAL | 3 refills | Status: DC

## 2020-06-27 NOTE — Progress Notes (Deleted)
[name] [address] [place]. [place] [address] [phone]       OFFICE FOLLOW UP NOTE  Ms. Analei Dorcely Date of Birth:  23-Sep-1971 Medical Record Number:  [id number]   [facility] provider: Dr. Pearlean Brownie Reason for Referral: Recurrence of migraines  No chief complaint on file.   HPI:  Today, 06/27/2020, Ms. Hanninen returns for follow-up regarding reoccurrence of chronic migraines with underlying history of migraines, cryptogenic stroke 2016, TIA, vertigo, depression and anxiety.  Evaluated at [facility] ED on 06/13/2020 due to pain and numbness in her left arm accompanied by headache.  Also complained of right-sided chest pain.  All work-up unremarkable including D-dimer, EKG, chest x-ray and CT head and treated for presumed cervical radiculopathy with naproxen and advised to follow-up with PCP.  Evaluated by PCP 06/19/2020 with complete resolution of symptoms.  Of note, presented to ED on 03/16/2020 with chief complaint of confusion and left arm numbness with headache and diagnosed with complex migraine.        History provided for reference purposes only Update 03/21/2020 JM, Ms. Yetman is being seen today accompanied by her son who assist with interpretation due to recent emergency room visit with left arm numbness and confusion accompanied by headache on 03/16/2020.  MRI brain negative for acute intracranial abnormality.  She was diagnosed with complex migraines as symptoms objective and no focal deficit identified.  She was advised to follow-up outpatient for further evaluation.  Prior medical history of cryptogenic stroke 2016, TIA, vertigo and migraines.  Previously treated in this office for migraine management on Topamax 25 mg twice daily but self discontinued due to difficulty tolerating.  She has not had any difficulties with migraines in over the past year and has not required any treatment.  Typical migraines recurred approximately 2 weeks ago located right temporal  and periorbital area with pulsating quality with blurred vision, photophobia, phonophobia and nausea.  Symptoms consistent with previously known migraines.  She may also experience pain in occipital region.  She has been experiencing migraines every other day which last up until 2 hours after laying down and resting.  She has not had any recurrence of confusion or numbness/tingling.  She also reports symptoms of fatigue, dizziness, and shortness of breath which appears to be anxiety related per son.  She also recently self discontinued lovastatin due to subjective increased anxiety after taking.  Previously discussed underlying anxiety at prior visits which likely contributing and/or worsening symptoms but she has previously declined further management or counseling.  She has continued on aspirin 81 mg daily without bleeding or bruising.  Blood pressure today 128/83.  No further concerns at this time.   Initial visit 09/29/2018 Dr. Pearlean Brownie:  [name] is a pleasant 49 year Hispanic lady originally from [place] who was seen today for evaluation for headaches.  She is accompanied by her friend as well as Spanish language interpreter who provides translation for this visit.  History is obtained from them, review of electronic medical records and have personally reviewed imaging films.  Patient has been complaining of increasing headaches for the last 2 months.  She describes this headache as being right temporal and frontal mostly throbbing in nature though fluctuating in severity from 6/10 to 10/10.  Headache is accompanied by nausea vomiting light and sound sensitivity.  She has to stop her activities and lie down and sleeping as well as taking Tylenol seems to help.  Headaches are accompanied by a single vision spots and scintillations and  blurred vision.  She denies any speech difficulties, vertigo, diplopia, gait or balance difficulties with headaches.  Headaches usually last about 30 to 40 minutes.   Headaches of late have been occurring about 2-3 times per week.  She does give a remote history of infrequent headaches which sound like migraine headaches and was similar but were mostly perimenstrual and not bad.  The patient also states in September 2019 she had a episode of speech difficulties as well as dizziness.  She was seen by primary care physician who states that the patient had a headache as well as feeling of dizziness and some speech difficulties.  This happened following a stressful event.  An outpatient MRI was ordered which was done on 08/05/2018 which I personally reviewed shows a few nonspecific white matter hyperintensities but no acute abnormality or definite stroke.  Patient states her symptoms improved within a few days but the headaches have persisted since then.  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 MRA of the brain showed slight diminished peripheral branches in the left MCA but no large vessel stenosis or occlusion. Carotid ultrasound showed no significant extracranial stenosis. Transthoracic echo showed normal ejection fraction. Transesophageal echocardiogram showed no cardiac source of embolism or PFO. Antiphospholipid antibodies and lupus anticoagulant were both negative. Lipid profile showed elevated total cholesterol 207 and LDL 139 mg percent. Hemoglobin A1c was 5.6. Patient was started on aspirin as well as protocol. She states her speech difficulty and right-sided weakness completely improved.  She was placed on aspirin and pravastatin which she actually did well on but for unclear reason she discontinued both medications.  She is currently on Lipitor 40 which was started a few months ago by primary physician but she is complaining of dizziness on this medication and wants to change it or stop  it.  She states she had slight balance difficulties from her previous stroke but had recovered well from that.  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  She states her blood pressure is under good control.  Last lipid profile checked in August 2019 showed LDL of 120 following which Lipitor was added.  I had recommended doing a transcranial Doppler bubble study at last visit with me in 2017 but for some unclear reason this was never done.  Update 12/30/2018: Mrs. Bradle is being seen today for follow-up visit for migraines and is accompanied by interpreter as patient is primarily Spanish-speaking.  Upon initial visit with Dr. Pearlean Brownie on 09/29/2018 (visit info below), headaches were not frequent enough to justify initiating migraine prophylaxis.  She did call back on 12/02/2018 reporting daily migraines and therefore initiated Topamax 25 mg twice daily.  She took this medication for 4 days but discontinued as medication was making her too fatigued.  She consider taking 1 tab at night only but wanted to speak with [place] in regards to this first.  She was evaluated in the ED on 12/19/2018 and returned on 12/23/2018 due to vaginal bleeding.  Apparently, all medications discontinued after 12/23/2018 visit and was started on Provera for 10-day course with recommending follow-up with OB/GYN for further evaluation.  She reports today that her headaches have improved and not experience a headache since ED admission on 12/23/2018.  She does have an appointment scheduled at [facility] on  01/12/2019 for further evaluation.  She denies any continued vaginal bleeding at this time.  Blood pressure today satisfactory 130/68. It was recommended after prior visit to undergo transcranial Doppler with bubble study to assess for possible PFO.  This was obtained on 10/14/2018 and was negative for PFO.  Update 06/21/2019 : She returns for follow-up after last visit 5 months ago.  She is accompanied by  a [place] language interpreter who was present throughout the visit and interprets for her.  Patient states her main complaint today is that her migraine headaches seem to have come back.  In the last 3 months she has had increased frequency of headaches which occur 3 to 4 days/week.  The headache starts off as a throbbing pain around her right temple as well as periorbital region becomes unbearable 10/10 in severity.  She has relied on.  Light and sound do worsen the headache.  She does not throw up or feel nauseous.  She does not have visual symptoms with the headaches.  Headache may last for most of the day.  She is unable to identify specific triggers for headaches but does feel tightness in the back of her head and neck muscles.  She has not tried triptans or prescription migraine specific medications she has tried taking Tylenol and ibuprofen without relief.  She has been taking naproxen which seems to work better.  She has been taking them 3 to 4 days a week.  She was previously on Topamax but when she started having dysfunctional uterine bleeding all the medications were stopped including Topamax.  She is unable to tell me if she had any bad side effects on it.  Patient recently changed her primary care physician and a new physician assistant discontinued protocol for unclear reason.  She remains on aspirin which is tolerating well without bruising or bleeding.  Her blood pressure is under good control and today it is 142/83.  She has had no recurrent stroke or TIA symptoms.  Update 09/02/2019: Ms. Aylsworth is being seen today for 32-month follow-up regarding migraine headaches accompanied by interpreter.  After prior visit, Topamax 25 mg daily was restarted due to worsening migraines as well as Relpax for emergent relief.  She did not tolerate Topamax as she reports this caused weight loss and short-term memory loss.  She was unable to afford Relpax.  She has recently been experiencing migraines 2 times  weekly and typically lasts for 1 hour total but during this time, migraines are debilitating and continues to be located right temporal area.  She has not trialed any other type of migraine medication.  Recent diagnosis of COVID-19 in 06/2019 and feels as though she has not completely recovered mentally and physically.  After prior visit, lipid panel obtained and recommended restart pravastatin but due to side effects, PCP initiated Crestor.  She reports difficulty tolerating Crestor but plans on following up with PCP for further monitoring and management.  Blood pressure today 135/91.  Update 11/04/2019: Ms. Zieger is a 49 year old female who is being seen today by patient request due to concerns of balance difficulties concern for stroke.  She is accompanied today by a interpreter.  She did not seek medical treatment at onset.  Approximately 1.5 weeks ago, she states she started experiencing intermittent balance impairment with a swaying type sensation and room spinning.  Symptoms severe where she will have to hold onto something and close her eyes.  She states she experienced same type of symptoms with prior stroke.  She denies associated headache, neck pain, speech difficulty, visual changes, weakness or numbness/tingling.  She denies increased stress or anxiety/depression.  She states standing straight up or laying flat or with head movement induces the symptoms.  She denies currently feeling symptoms at today's visit.  She does endorse recently stopping her metoprolol 3 days ago due to increased stress and irritability symptoms.  She does monitor blood pressure at home and SBP 130s.  She is unable to endorse diastolic pressures or heart rates.  Blood pressure today satisfactory at 132/96.  He does endorse continuation of aspirin 81 mg daily and recently started on pravastatin approximately 3 weeks ago by PCP.  Update 11/09/2019: Ms. Scordato is a 49 year old female who is being seen today by patient request  due to recent ED admission on 11/06/2019 for ongoing concerns of dizziness previously evaluated in office 5 days ago.  She is accompanied by interpreter.  Per review of ED note, she reported worsening dizziness and headache 2 days prior to ED admission concerning for stroke.  CT head and MRI brain no acute abnormalities.  She was advised to follow-up with PCP and neurology at discharge.  She has been doing well since discharge without any reoccurring vertigo or dizziness symptoms or headaches.  She has continued to take meclizine with benefit 25 mg tablets twice daily.  She continues on aspirin 81 mg daily and pravastatin for secondary stroke prevention without side effects. Blood pressure today 124/71.  No further concerns at this time.  Update 01/04/2020: Ms. Kwiat is a 49 year old female who is being seen today for follow-up regarding prior concerns of dizziness accompanied by interpreter.  Denies residual/dizziness symptoms.  Continues twice daily meclizine with occasional fatigue.  Continues on aspirin 81 mg daily and pravastatin for secondary stroke prevention without side effects.  Blood pressure today 127/78.  Mild short-term memory loss but endorses ongoing improvement.  No concerns at this time.    ROS:   14 system review of systems is positive for see HPI and all other systems negative  PMH:  Past Medical History:  Diagnosis Date  . COVID-19   . Depression   . Frequent headaches   . 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 Hyperlipidemia   . Hypertension   . Stroke (HCC)    09/2015  . Vertigo     Social History:  Social History   Socioeconomic History  . Marital status: Married    Spouse name: Not on file  . Number of children: Not on file  . Years of education: Not on file  . Highest education level: Not on file  Occupational History  . Occupation: Financial trader  Tobacco Use  . Smoking status: Never Smoker  . Smokeless tobacco: Never Used  Vaping Use  . Vaping Use: Never used    Substance and Sexual Activity  . Alcohol use: No  . Drug use: No  . Sexual activity: Not Currently    Birth control/protection: None  Other Topics Concern  . Not on file  Social History Narrative   From [place]   Lived in [place] for 20 years, and been in [place] for 6 years   Husband and son, 74   Works: clean homes   Social Determinants of Corporate investment banker Strain:   . Difficulty of Paying Living Expenses:   Food Insecurity:   . Worried About Programme researcher, broadcasting/film/video in the Last Year:   . [employer] of Food in the Last Year:  Transportation Needs:   . Freight forwarder (Medical):   Marland Kitchen Lack of Transportation (Non-Medical):   Physical Activity:   . Days of Exercise per Week:   . Minutes of Exercise per Session:   Stress:   . Feeling of Stress :   Social Connections:   . Frequency of Communication with Friends and Family:   . Frequency of Social Gatherings with Friends and Family:   . Attends Religious Services:   . Active Member of Clubs or Organizations:   . Attends Banker Meetings:   Marland Kitchen Marital Status:   Intimate Partner Violence:   . Fear of Current or Ex-Partner:   . Emotionally Abused:   Marland Kitchen Physically Abused:   . Sexually Abused:     Medications:   Current Outpatient Medications on File Prior to Visit  Medication Sig Dispense Refill  . amitriptyline (ELAVIL) 10 MG tablet Take 1 tablet (10 mg total) by mouth at bedtime. 90 tablet 3  . ASPIRIN LOW DOSE 81 MG EC tablet Take 81 mg by mouth daily.    . fenofibrate 54 MG tablet Take 54 mg by mouth daily. with food    . fish oil-omega-3 fatty acids 1000 MG capsule Take 2 capsules (2 g total) by mouth daily.    . meclizine (ANTIVERT) 25 MG tablet Take 1 tablet (25 mg total) by mouth 2 (two) times daily as needed for dizziness. 60 tablet 0  . metoprolol tartrate (LOPRESSOR) 25 MG tablet Take 0.5 tablets (12.5 mg total) by mouth 2 (two) times daily. 90 tablet 1  . naproxen (NAPROSYN) 500 MG tablet Take 1  tablet (500 mg total) by mouth 2 (two) times daily. 30 tablet 0  . triamcinolone cream (KENALOG) 0.1 % Apply to affected area 1-2 times daily 30 g 0   No current facility-administered medications on file prior to visit.    Allergies:   Allergies  Allergen Reactions  . Lipitor [Atorvastatin Calcium] Swelling    throat  . Pravastatin Anxiety  . Tramadol Nausea Only    Physical Exam  There were no vitals filed for this visit. There is no height or weight on file to calculate BMI.  Physical exam General: well developed, well nourished, pleasant middle-aged Hispanic lady, seated, in no apparent distress Head: head normocephalic and atraumatic.   Neck: supple with no carotid or supraclavicular bruits Cardiovascular: regular rate and rhythm, no murmurs Musculoskeletal: no deformity Skin:  no rash/petichiae Vascular:  Normal pulses all extremities  Neurologic Exam Mental Status: awake and fully alert.  Primarily Spanish-speaking and person, no speech or language impairment.  Oriented to place and time. Recent and remote memory intact. Attention span, concentration and fund of knowledge appropriate. Mood and affect appropriate Cranial Nerves: Pupils equal, briskly reactive to light.  Extraocular movements full without nystagmus. Visual fields full to confrontation with subjective blurriness (chronic). Hearing intact. Facial sensation intact. Face, tongue, palate moves normally and symmetrically.   Motor: Normal bulk and tone. Normal strength in all tested extremity muscles. Sensory.: intact to touch , pinprick , position and vibratory sensation.  Coordination: Rapid alternating movements normal in all extremities. Finger-to-nose and heel-to-shin performed accurately bilaterally. Gait and Station: Arises from chair without difficulty. Stance is normal. Gait demonstrates normal stride length and balance . Able to heel, toe and tandem walk without difficulty.  Romberg negative. Reflexes: 1+  and symmetric. Toes downgoing.      ASSESSMENT/PLAN:  Lylee Olmedo is a 49 year old Hispanic lady with with PMH of cryptogenic  stroke 2016, TIA, HDL, HTN, BPPV, anxiety and migraines.  History of chronic migraines worsening back in April with MRI unremarkable for acute abnormalities    Chronic migraines -Initiate amitriptyline 10 mg nightly for reoccurrence of migraines with debilitating symptoms.  Advised this medication may also help with underlying anxiety.    History of stroke/TIA -Continue aspirin 81 mg and omega-3 for secondary stroke prevention.  History of statin intolerance -Continue to follow with PCP for HTN and HLD management and secondary stroke risk factor management with BP goal<130/90 and LDL goal<70    Follow-up in 3 months or call earlier if needed   I spent 35 minutes of face-to-face and non-face-to-face time with patient and son who assisted with interpretation.  This included previsit chart review, lab review, study review, order entry, electronic health record documentation, patient education regarding recurrent migraine episodes, ongoing untreated anxiety likely contributing to multiple complaints/symptoms, and answered all questions to patient and son satisfaction   Ihor Austin, AGNP-BC  [facility] [address] [place], [place] [address]  Phone [phone] Fax [phone] Note: This document was prepared with digital dictation and possible smart phrase technology. Any transcriptional errors that result from this process are unintentional.

## 2020-06-27 NOTE — Patient Instructions (Addendum)
Your Plan:  Increase amitrityline to 20 mg nightly and start effexor 37.5mg  daily for migraine and headache management  Please call with any difficulty tolerating   Other ways to help control or limit headaches:   Cool Compress. Lie down and place a cool compress on your head.    Avoid headache triggers. If certain foods or odors seem to have triggered your migraines in the past, avoid them. A headache diary might help you identify triggers.    Include physical activity in your daily routine.   Manage stress. Find healthy ways to cope with the stressors, such as delegating tasks on your to-do list.    Practice relaxation techniques. Try deep breathing, yoga, massage and visualization.    Eat regularly. Eating regularly scheduled meals and maintaining a healthy diet might help prevent headaches. Also, drink plenty of fluids.    Follow a regular sleep schedule. Sleep deprivation might contribute to headaches  Consider biofeedback. With this mind-body technique, you learn to control certain bodily functions -- such as muscle tension, heart rate and blood pressure -- to prevent headaches or reduce headache pain.    Follow up in 3 months or call earlier if needed           Thank you for coming to see [place] at [facility]. I hope we have been able to provide you high quality care today.  You may receive a patient satisfaction survey over the next few weeks. We would appreciate your feedback and comments so that we may continue to improve ourselves and the health of our patients.    Venlafaxine tablets What is this medicine? VENLAFAXINE (VEN la fax een) is used to treat depression, anxiety and panic disorder. This medicine may be used for other purposes; ask your health care provider or pharmacist if you have questions. COMMON BRAND NAME(S): Effexor What should I tell my health care provider before I take this medicine? They need to know if you have any of  these conditions:  bleeding disorders  glaucoma  heart disease  high blood pressure  high cholesterol  kidney disease  liver disease  low levels of sodium in the blood  mania or bipolar disorder  seizures  suicidal thoughts, plans, or attempt; a previous suicide attempt by you or a family  take medicines that treat or prevent blood clots  thyroid disease  an unusual or allergic reaction to venlafaxine, desvenlafaxine, other medicines, foods, dyes, or preservatives  pregnant or trying to get pregnant  breast-feeding How should I use this medicine? Take this medicine by mouth with a glass of water. Follow the directions on the prescription label. Take it with food. Take your medicine at regular intervals. Do not take your medicine more often than directed. Do not stop taking this medicine suddenly except upon the advice of your doctor. Stopping this medicine too quickly may cause serious side effects or your condition may worsen. A special MedGuide will be given to you by the pharmacist with each prescription and refill. Be sure to read this information carefully each time. Talk to your pediatrician regarding the use of this medicine in children. Special care may be needed. Overdosage: If you think you have taken too much of this medicine contact a poison control center or emergency room at once. NOTE: This medicine is only for you. Do not share this medicine with others. What if I miss a dose? If you miss a dose, take it as soon as you can. If it  is almost time for your next dose, take only that dose. Do not take double or extra doses. What may interact with this medicine? Do not take this medicine with any of the following medications:  certain medicines for fungal infections like fluconazole, itraconazole, ketoconazole, posaconazole, voriconazole  cisapride  desvenlafaxine  dronedarone  duloxetine  levomilnacipran  linezolid  MAOIs like Carbex, Eldepryl,  Marplan, Nardil, and Parnate  methylene blue (injected into a vein)  milnacipran  pimozide  thioridazine This medicine may also interact with the following medications:  amphetamines  aspirin and aspirin-like medicines  certain medicines for depression, anxiety, or psychotic disturbances  certain medicines for migraine headaches like almotriptan, eletriptan, frovatriptan, naratriptan, rizatriptan, sumatriptan, zolmitriptan  certain medicines for sleep  certain medicines that treat or prevent blood clots like dalteparin, enoxaparin, warfarin  cimetidine  clozapine  diuretics  fentanyl  furazolidone  indinavir  isoniazid  lithium  metoprolol  NSAIDS, medicines for pain and inflammation, like ibuprofen or naproxen  other medicines that prolong the QT interval (cause an abnormal heart rhythm) like dofetilide, ziprasidone  procarbazine  rasagiline  supplements like St. John's wort, kava kava, valerian  tramadol  tryptophan This list may not describe all possible interactions. Give your health care provider a list of all the medicines, herbs, non-prescription drugs, or dietary supplements you use. Also tell them if you smoke, drink alcohol, or use illegal drugs. Some items may interact with your medicine. What should I watch for while using this medicine? Tell your doctor if your symptoms do not get better or if they get worse. Visit your doctor or health care professional for regular checks on your progress. Because it may take several weeks to see the full effects of this medicine, it is important to continue your treatment as prescribed by your doctor. Patients and their families should watch out for new or worsening thoughts of suicide or depression. Also watch out for sudden changes in feelings such as feeling anxious, agitated, panicky, irritable, hostile, aggressive, impulsive, severely restless, overly excited and hyperactive, or not being able to sleep. If  this happens, especially at the beginning of treatment or after a change in dose, call your health care professional. This medicine can cause an increase in blood pressure. Check with your doctor for instructions on monitoring your blood pressure while taking this medicine. You may get drowsy or dizzy. Do not drive, use machinery, or do anything that needs mental alertness until you know how this medicine affects you. Do not stand or sit up quickly, especially if you are an older patient. This reduces the risk of dizzy or fainting spells. Alcohol may interfere with the effect of this medicine. Avoid alcoholic drinks. Your mouth may get dry. Chewing sugarless gum, sucking hard candy and drinking plenty of water will help. Contact your doctor if the problem does not go away or is severe. What side effects may I notice from receiving this medicine? Side effects that you should report to your doctor or health care professional as soon as possible:  allergic reactions like skin rash, itching or hives, swelling of the face, lips, or tongue  anxious  breathing problems  confusion  changes in vision  chest pain  confusion  elevated mood, decreased need for sleep, racing thoughts, impulsive behavior  eye pain  fast, irregular heartbeat  feeling faint or lightheaded, falls  feeling agitated, angry, or irritable  hallucination, loss of contact with reality  high blood pressure  loss of balance or coordination  palpitations  redness, blistering, peeling or loosening of the skin, including inside the mouth  restlessness, pacing, inability to keep still  seizures  stiff muscles  suicidal thoughts or other mood changes  trouble passing urine or change in the amount of urine  trouble sleeping  unusual bleeding or bruising  unusually weak or tired  vomiting Side effects that usually do not require medical attention (report to your doctor or health care professional if they  continue or are bothersome):  change in sex drive or performance  change in appetite or weight  constipation  dizziness  dry mouth  headache  increased sweating  nausea  tired This list may not describe all possible side effects. Call your doctor for medical advice about side effects. You may report side effects to FDA at 1-800-FDA-1088. Where should I keep my medicine? Keep out of the reach of children. Store at a controlled temperature between 20 and 25 degrees C (68 and 77 degrees F), in a dry place. Throw away any unused medicine after the expiration date. NOTE: This sheet is a summary. It may not cover all possible information. If you have questions about this medicine, talk to your doctor, pharmacist, or health care provider.  2020 [employer] (2018-11-03 12:08:23)

## 2020-06-27 NOTE — Progress Notes (Signed)
[name] [address] [place]. [place] [address] [phone]       OFFICE FOLLOW UP NOTE  [name] Date of Birth:  Jan 03, 1971 Medical Record Number:  [id number]   [facility] provider: Dr. Pearlean Brownie Reason for Referral: Recurrence of migraines   Chief complaint Chief Complaint  Patient presents with  . Headache    Tension type headache every other day  . Migraine    4-5 times monthly  . Dizziness    Occasional.  Being evaluated by ENT    HPI:  Today, 06/27/2020, Carla [name] returns for follow-up accompanied by interpreter regarding reoccurrence of chronic migraines with underlying history of migraines, cryptogenic stroke 2016, TIA, vertigo, depression and anxiety.  Evaluated at [facility] ED on 06/13/2020 due to pain and numbness in her left arm accompanied by headache. All work-up unremarkable including D-dimer, EKG, chest x-ray and CT head and treated for presumed cervical radiculopathy with naproxen and advised to follow-up with PCP.  Evaluated by PCP 06/19/2020 with complete resolution of symptoms.  Of note, presented to ED on 03/16/2020 with chief complaint of confusion and left arm numbness with headache and diagnosed with complex migraine.  Migraine occurrence has improved since prior visit with ongoing use of amitriptyline 10 mg nightly.  Severe migraine occurrence 4 -5 times per months but does experience tension type headaches almost every other day lasting for approximately 1 hour and are not debilitating.  Reports tension type headaches occur with increased stress or anxiety.  She has not had any reoccurrence of left arm numbness or any other neurological symptoms  Intermittent vertigo and PCP recently referred to ENT  She remains uninsured but is currently in the process of applying for Medicaid      History provided for reference purposes only Update 03/21/2020 JM, Ms. Geer is being seen today accompanied by her son who assist with interpretation  due to recent emergency room visit with left arm numbness and confusion accompanied by headache on 03/16/2020.  MRI brain negative for acute intracranial abnormality.  She was diagnosed with complex migraines as symptoms objective and no focal deficit identified.  She was advised to follow-up outpatient for further evaluation.  Prior medical history of cryptogenic stroke 2016, TIA, vertigo and migraines.  Previously treated in this office for migraine management on Topamax 25 mg twice daily but self discontinued due to difficulty tolerating.  She has not had any difficulties with migraines in over the past year and has not required any treatment.  Typical migraines recurred approximately 2 weeks ago located right temporal and periorbital area with pulsating quality with blurred vision, photophobia, phonophobia and nausea.  Symptoms consistent with previously known migraines.  She may also experience pain in occipital region.  She has been experiencing migraines every other day which last up until 2 hours after laying down and resting.  She has not had any recurrence of confusion or numbness/tingling.  She also reports symptoms of fatigue, dizziness, and shortness of breath which appears to be anxiety related per son.  She also recently self discontinued lovastatin due to subjective increased anxiety after taking.  Previously discussed underlying anxiety at prior visits which likely contributing and/or worsening symptoms but she has previously declined further management or counseling.  She has continued on aspirin 81 mg daily without bleeding or bruising.  Blood pressure today 128/83.  No further concerns at this time.   Initial visit 09/29/2018 Dr. Pearlean Brownie:  Ms Mariah [name] is a pleasant 5 year Hispanic lady originally from  [place] who was seen today for evaluation for headaches.  She is accompanied by her friend as well as Spanish language interpreter who provides translation for this visit.  History is obtained  from them, review of electronic medical records and have personally reviewed imaging films.  Patient has been complaining of increasing headaches for the last 2 months.  She describes this headache as being right temporal and frontal mostly throbbing in nature though fluctuating in severity from 6/10 to 10/10.  Headache is accompanied by nausea vomiting light and sound sensitivity.  She has to stop her activities and lie down and sleeping as well as taking Tylenol seems to help.  Headaches are accompanied by a single vision spots and scintillations and blurred vision.  She denies any speech difficulties, vertigo, diplopia, gait or balance difficulties with headaches.  Headaches usually last about 30 to 40 minutes.  Headaches of late have been occurring about 2-3 times per week.  She does give a remote history of infrequent headaches which sound like migraine headaches and was similar but were mostly perimenstrual and not bad.  The patient also states in September 2019 she had a episode of speech difficulties as well as dizziness.  She was seen by primary care physician who states that the patient had a headache as well as feeling of dizziness and some speech difficulties.  This happened following a stressful event.  An outpatient MRI was ordered which was done on 08/05/2018 which I personally reviewed shows a few nonspecific white matter hyperintensities but no acute abnormality or definite stroke.  Patient states her symptoms improved within a few days but the headaches have persisted since then.  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 MRA of the brain showed slight diminished peripheral branches in the left MCA but no large vessel stenosis or occlusion. Carotid ultrasound showed no significant extracranial stenosis.  Transthoracic echo showed normal ejection fraction. Transesophageal echocardiogram showed no cardiac source of embolism or PFO. Antiphospholipid antibodies and lupus anticoagulant were both negative. Lipid profile showed elevated total cholesterol 207 and LDL 139 mg percent. Hemoglobin A1c was 5.6. Patient was started on aspirin as well as protocol. She states her speech difficulty and right-sided weakness completely improved.  She was placed on aspirin and pravastatin which she actually did well on but for unclear reason she discontinued both medications.  She is currently on Lipitor 40 which was started a few months ago by primary physician but she is complaining of dizziness on this medication and wants to change it or stop it.  She states she had slight balance difficulties from her previous stroke but had recovered well from that.  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  She states her blood pressure is under good control.  Last lipid profile checked in August 2019 showed LDL of 120 following which Lipitor was added.  I had recommended doing a transcranial Doppler bubble study at last visit with me in 2017 but for some unclear reason this was never done.  Update 12/30/2018: Mrs. Mean is being seen today for follow-up visit for migraines and is accompanied by interpreter as patient is primarily Spanish-speaking.  Upon initial visit with Dr. Pearlean Brownie on 09/29/2018 (visit info below), headaches were not frequent enough to justify initiating migraine prophylaxis.  She did call back on 12/02/2018 reporting daily migraines and therefore initiated Topamax 25 mg twice daily.  She took this medication for 4 days but discontinued as medication was making her too fatigued.  She consider taking 1 tab at night only but wanted to speak with [place] in regards to this first.  She was evaluated in the ED on 12/19/2018 and returned on 12/23/2018 due to vaginal bleeding.  Apparently, all  medications discontinued after 12/23/2018 visit and was started on Provera for 10-day course with recommending follow-up with OB/GYN for further evaluation.  She reports today that her headaches have improved and not experience a headache since ED admission on 12/23/2018.  She does have an appointment scheduled at [facility] on 01/12/2019 for further evaluation.  She denies any continued vaginal bleeding at this time.  Blood pressure today satisfactory 130/68. It was recommended after prior visit to undergo transcranial Doppler with bubble study to assess for possible PFO.  This was obtained on 10/14/2018 and was negative for PFO.  Update 06/21/2019 : She returns for follow-up after last visit 5 months ago.  She is accompanied by a [place] language interpreter who was present throughout the visit and interprets for her.  Patient states her main complaint today is that her migraine headaches seem to have come back.  In the last 3 months she has had increased frequency of headaches which occur 3 to 4 days/week.  The headache starts off as a throbbing pain around her right temple as well as periorbital region becomes unbearable 10/10 in severity.  She has relied on.  Light and sound do worsen the headache.  She does not throw up or feel nauseous.  She does not have visual symptoms with the headaches.  Headache may last for most of the day.  She is unable to identify specific triggers for headaches but does feel tightness in the back of her head and neck muscles.  She has not tried triptans or prescription migraine specific medications she has tried taking Tylenol and ibuprofen without relief.  She has been taking naproxen which seems to work better.  She has been taking them 3 to 4 days a week.  She was previously on Topamax but when she started having dysfunctional uterine bleeding all the medications were stopped including Topamax.  She is unable to tell me if she had any bad side effects on it.  Patient  recently changed her primary care physician and a new physician assistant discontinued protocol for unclear reason.  She remains on aspirin which is tolerating well without bruising or bleeding.  Her blood pressure is under good control and today it is 142/83.  She has had no recurrent stroke or TIA symptoms.  Update 09/02/2019: Ms. Sharif is being seen today for 31-month follow-up regarding migraine headaches accompanied by interpreter.  After prior visit, Topamax 25 mg daily was restarted due to worsening migraines as well as Relpax for emergent relief.  She did not tolerate Topamax as she reports this caused weight loss and short-term memory loss.  She was unable to afford Relpax.  She has recently been experiencing migraines 2 times weekly and typically lasts for 1 hour total but during this time, migraines are debilitating and continues to be located right temporal area.  She has not trialed any other type of migraine medication.  Recent diagnosis of COVID-19 in 06/2019 and feels as though she has not completely recovered mentally and physically.  After prior visit, lipid panel obtained and recommended  restart pravastatin but due to side effects, PCP initiated Crestor.  She reports difficulty tolerating Crestor but plans on following up with PCP for further monitoring and management.  Blood pressure today 135/91.  Update 11/04/2019: Ms. Bonifas is a 49 year old female who is being seen today by patient request due to concerns of balance difficulties concern for stroke.  She is accompanied today by a interpreter.  She did not seek medical treatment at onset.  Approximately 1.5 weeks ago, she states she started experiencing intermittent balance impairment with a swaying type sensation and room spinning.  Symptoms severe where she will have to hold onto something and close her eyes.  She states she experienced same type of symptoms with prior stroke.  She denies associated headache, neck pain, speech difficulty,  visual changes, weakness or numbness/tingling.  She denies increased stress or anxiety/depression.  She states standing straight up or laying flat or with head movement induces the symptoms.  She denies currently feeling symptoms at today's visit.  She does endorse recently stopping her metoprolol 3 days ago due to increased stress and irritability symptoms.  She does monitor blood pressure at home and SBP 130s.  She is unable to endorse diastolic pressures or heart rates.  Blood pressure today satisfactory at 132/96.  He does endorse continuation of aspirin 81 mg daily and recently started on pravastatin approximately 3 weeks ago by PCP.  Update 11/09/2019: Ms. Allegra is a 49 year old female who is being seen today by patient request due to recent ED admission on 11/06/2019 for ongoing concerns of dizziness previously evaluated in office 5 days ago.  She is accompanied by interpreter.  Per review of ED note, she reported worsening dizziness and headache 2 days prior to ED admission concerning for stroke.  CT head and MRI brain no acute abnormalities.  She was advised to follow-up with PCP and neurology at discharge.  She has been doing well since discharge without any reoccurring vertigo or dizziness symptoms or headaches.  She has continued to take meclizine with benefit 25 mg tablets twice daily.  She continues on aspirin 81 mg daily and pravastatin for secondary stroke prevention without side effects. Blood pressure today 124/71.  No further concerns at this time.  Update 01/04/2020: Ms. Brodman is a 49 year old female who is being seen today for follow-up regarding prior concerns of dizziness accompanied by interpreter.  Denies residual/dizziness symptoms.  Continues twice daily meclizine with occasional fatigue.  Continues on aspirin 81 mg daily and pravastatin for secondary stroke prevention without side effects.  Blood pressure today 127/78.  Mild short-term memory loss but endorses ongoing improvement.   No concerns at this time.    ROS:   14 system review of systems is positive for see HPI and all other systems negative  PMH:  Past Medical History:  Diagnosis Date  . COVID-19   . Depression   . Frequent headaches   . 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 Hyperlipidemia   . Hypertension   . Stroke (HCC)    09/2015  . Vertigo     Social History:  Social History   Socioeconomic History  . Marital status: Married    Spouse name: Not on file  . Number of children: Not on file  . Years of education: Not on file  . Highest education level: Not on file  Occupational History  . Occupation: Financial trader  Tobacco Use  . Smoking status: Never Smoker  . Smokeless tobacco: Never Used  Vaping Use  .  Vaping Use: Never used  Substance and Sexual Activity  . Alcohol use: No  . Drug use: No  . Sexual activity: Not Currently    Birth control/protection: None  Other Topics Concern  . Not on file  Social History Narrative   From [place]   Lived in [place] for 20 years, and been in [place] for 6 years   Husband and son, 52   Works: clean homes   Social Determinants of Corporate investment banker Strain:   . Difficulty of Paying Living Expenses:   Food Insecurity:   . Worried About Programme researcher, broadcasting/film/video in the Last Year:   . Barista in the Last Year:   Transportation Needs:   . Freight forwarder (Medical):   Marland Kitchen Lack of Transportation (Non-Medical):   Physical Activity:   . Days of Exercise per Week:   . Minutes of Exercise per Session:   Stress:   . Feeling of Stress :   Social Connections:   . Frequency of Communication with Friends and Family:   . Frequency of Social Gatherings with Friends and Family:   . Attends Religious Services:   . Active Member of Clubs or Organizations:   . Attends Banker Meetings:   Marland Kitchen Marital Status:   Intimate Partner Violence:   . Fear of Current or Ex-Partner:   . Emotionally Abused:   Marland Kitchen Physically Abused:   . Sexually  Abused:     Medications:   Current Outpatient Medications on File Prior to Visit  Medication Sig Dispense Refill  . ASPIRIN LOW DOSE 81 MG EC tablet Take 81 mg by mouth daily.    . fenofibrate 54 MG tablet Take 54 mg by mouth daily. with food    . fish oil-omega-3 fatty acids 1000 MG capsule Take 2 capsules (2 g total) by mouth daily.    . meclizine (ANTIVERT) 25 MG tablet Take 1 tablet (25 mg total) by mouth 2 (two) times daily as needed for dizziness. 60 tablet 0  . metoprolol tartrate (LOPRESSOR) 25 MG tablet Take 0.5 tablets (12.5 mg total) by mouth 2 (two) times daily. 90 tablet 1  . naproxen (NAPROSYN) 500 MG tablet Take 1 tablet (500 mg total) by mouth 2 (two) times daily. 30 tablet 0  . triamcinolone cream (KENALOG) 0.1 % Apply to affected area 1-2 times daily 30 g 0   No current facility-administered medications on file prior to visit.    Allergies:   Allergies  Allergen Reactions  . Lipitor [Atorvastatin Calcium] Swelling    throat  . Pravastatin Anxiety  . Tramadol Nausea Only    Physical Exam  Today's Vitals   06/27/20 0958  BP: 140/80  Pulse: 94  Weight: 134 lb (60.8 kg)  Height: 5\' 4"  (1.626 m)   Body mass index is 23 kg/m.  Physical exam General: well developed, well nourished, pleasant middle-aged Hispanic lady, seated, in no apparent distress Head: head normocephalic and atraumatic.   Neck: supple with no carotid or supraclavicular bruits Cardiovascular: regular rate and rhythm, no murmurs Musculoskeletal: no deformity Skin:  no rash/petichiae Vascular:  Normal pulses all extremities  Neurologic Exam Mental Status: awake and fully alert.  Primarily Spanish-speaking with limited English.  Denies speech and language concerns.  Oriented to place and time. Recent and remote memory intact. Attention span, concentration and fund of knowledge appropriate. Mood and affect appropriate Cranial Nerves: Pupils equal, briskly reactive to light.  Extraocular  movements full  without nystagmus. Visual fields full to confrontation. Hearing intact. Facial sensation intact. Face, tongue, palate moves normally and symmetrically.   Motor: Normal bulk and tone. Normal strength in all tested extremity muscles. Sensory.: intact to touch , pinprick , position and vibratory sensation.  Coordination: Rapid alternating movements normal in all extremities. Finger-to-nose and heel-to-shin performed accurately bilaterally. Gait and Station: Arises from chair without difficulty. Stance is normal. Gait demonstrates normal stride length and balance . Able to heel, toe and tandem walk without difficulty.  Romberg negative. Reflexes: 1+ and symmetric. Toes downgoing.      ASSESSMENT/PLAN:  Kollyns Bucciarelli is a 49 year old Hispanic lady with with PMH of cryptogenic stroke 2016, TIA, HDL, HTN, BPPV, anxiety and migraines.  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  History of statin intolerance -Continue to follow with PCP for HTN and HLD management and secondary stroke risk factor management with BP goal<130/90 and LDL goal<70    Follow-up in 3 months or call earlier if needed   I spent 35 minutes of face-to-face and non-face-to-face time with patient assisted by interpreter.  This included previsit chart review, lab review, study review, order entry, electronic health record documentation, patient education regarding recurrent migraine episodes with  mixed tension type features, and answered all questions to patient satisfaction   Ihor Austin, [facility]  [facility] [address] [place], [place] [address]  Phone [phone] Fax [phone] Note: This document was prepared with digital dictation and possible smart phrase technology. Any transcriptional errors that result from this process are unintentional.

## 2020-08-22 ENCOUNTER — Telehealth: Payer: Self-pay

## 2020-08-22 NOTE — Telephone Encounter (Signed)
err

## 2020-10-16 ENCOUNTER — Ambulatory Visit (INDEPENDENT_AMBULATORY_CARE_PROVIDER_SITE_OTHER): Payer: Self-pay | Admitting: Adult Health

## 2020-10-16 ENCOUNTER — Encounter: Payer: Self-pay | Admitting: Adult Health

## 2020-10-16 VITALS — BP 125/76 | HR 122 | Ht 64.0 in | Wt 140.8 lb

## 2020-10-16 DIAGNOSIS — G459 Transient cerebral ischemic attack, unspecified: Secondary | ICD-10-CM

## 2020-10-16 DIAGNOSIS — R42 Dizziness and giddiness: Secondary | ICD-10-CM

## 2020-10-16 DIAGNOSIS — G4489 Other headache syndrome: Secondary | ICD-10-CM

## 2020-10-16 DIAGNOSIS — G43109 Migraine with aura, not intractable, without status migrainosus: Secondary | ICD-10-CM

## 2020-10-16 DIAGNOSIS — Z8673 Personal history of transient ischemic attack (TIA), and cerebral infarction without residual deficits: Secondary | ICD-10-CM

## 2020-10-16 MED ORDER — MECLIZINE HCL 25 MG PO TABS: 25.0000 mg | ORAL_TABLET | Freq: Two times a day (BID) | ORAL | 0 refills | Status: DC | PRN

## 2020-10-16 NOTE — Patient Instructions (Addendum)
Continue amitrityline and effexor for migraine prevention  Continue meclizine as needed for dizziness  We will attempt to get results from Ears, nose and throat evaluation - if I get the results, I will send you additional information to your MyChart  Continue aspirin 81 mg daily  and omega-3  for secondary stroke prevention  Continue to follow up with PCP regarding cholesterol and blood pressure management  Maintain strict control of hypertension with blood pressure goal below 130/90 and cholesterol with LDL cholesterol (bad cholesterol) goal below 70 mg/dL.     Followup in the future with me in 6 months or call earlier if needed     Thank you for coming to see [place] at [facility]. I hope we have been able to provide you high quality care today.  You may receive a patient satisfaction survey over the next few weeks. We would appreciate your feedback and comments so that we may continue to improve ourselves and the health of our patients.

## 2020-10-16 NOTE — Progress Notes (Signed)
[name] [address] [place]. [place] [address] [phone]       OFFICE FOLLOW UP NOTE  [name] Date of Birth:  Apr 05, 1971 Medical Record Number:  [id number]   [facility] provider: Dr. Pearlean Brownie Reason for Referral: Recurrence of migraines   Chief complaint Chief Complaint  Patient presents with  . Follow-up    rm 9  . Migraine    Pt said she is ok today. She said she got new glasses and the Dr, said it would be normal to have headaches when she startd wearing them. Other that that no new migraines.    HPI:  Today, 10/16/2020, Carla [name] returns for 62-month follow-up accompanied by interpreter.  At prior visit, complaints of continued migraines which have since greatly improved with use of amitriptyline 25 mg nightly and venlafaxine 37.5 mg daily tolerating well without side effects.  Reports 1 migraine over the past month.  She does report occasional tension type headaches but has been told likely due to new rx lenses and have been slowly improving.  Prior complaints of intermittent vertigo but overall stable and will take meclizine only as needed.  She does report evaluation by ENT Dr. Suszanne Conners for vertigo approximately 3 to 4 months ago but has not received results.  Stable from TIA/stroke standpoint without new or reoccurring stroke/TIA symptoms.  Remains on aspirin 81 mg daily and omega-3 for secondary stroke prevention of side effects.  Blood pressure today 125/76.  No further concerns at this time.    History provided for reference purposes only Update 06/27/2020 JM: Carla [name] returns for follow-up accompanied by interpreter regarding reoccurrence of chronic migraines with underlying history of migraines, cryptogenic stroke 2016, TIA, vertigo, depression and anxiety. Evaluated at [facility] ED on 06/13/2020 due to pain and numbness in her left arm accompanied by headache. All work-up unremarkable including D-dimer, EKG, chest x-ray and CT head and treated for  presumed cervical radiculopathy with naproxen and advised to follow-up with PCP.  Evaluated by PCP 06/19/2020 with complete resolution of symptoms. Of note, presented to ED on 03/16/2020 with chief complaint of confusion and left arm numbness with headache and diagnosed with complex migraine. Migraine occurrence has improved since prior visit with ongoing use of amitriptyline 10 mg nightly.  Severe migraine occurrence 4 -5 times per months but does experience tension type headaches almost every other day lasting for approximately 1 hour and are not debilitating.  Reports tension type headaches occur with increased stress or anxiety.  She has not had any reoccurrence of left arm numbness or any other neurological symptoms Intermittent vertigo and PCP recently referred to ENT She remains uninsured but is currently in the process of applying for Medicaid   Update 03/21/2020 JM, Carla [name] is being seen today accompanied by her son who assist with interpretation due to recent emergency room visit with left arm numbness and confusion accompanied by headache on 03/16/2020.  MRI brain negative for acute intracranial abnormality.  She was diagnosed with complex migraines as symptoms objective and no focal deficit identified.  She was advised to follow-up outpatient for further evaluation.  Prior medical history of cryptogenic stroke 2016, TIA, vertigo and migraines.  Previously treated in this office for migraine management on Topamax 25 mg twice daily but self discontinued due to difficulty tolerating.  She has not had any difficulties with migraines in over the past year and has not required any treatment.  Typical migraines recurred approximately 2 weeks ago located right temporal and  periorbital area with pulsating quality with blurred vision, photophobia, phonophobia and nausea.  Symptoms consistent with previously known migraines.  She may also experience pain in occipital region.  She has been experiencing  migraines every other day which last up until 2 hours after laying down and resting.  She has not had any recurrence of confusion or numbness/tingling.  She also reports symptoms of fatigue, dizziness, and shortness of breath which appears to be anxiety related per son.  She also recently self discontinued lovastatin due to subjective increased anxiety after taking.  Previously discussed underlying anxiety at prior visits which likely contributing and/or worsening symptoms but she has previously declined further management or counseling.  She has continued on aspirin 81 mg daily without bleeding or bruising.  Blood pressure today 128/83.  No further concerns at this time.   Initial visit 09/29/2018 Dr. Pearlean Brownie:  Ms Higbie is a pleasant 71 year Hispanic lady originally from [place] who was seen today for evaluation for headaches.  She is accompanied by her friend as well as Spanish language interpreter who provides translation for this visit.  History is obtained from them, review of electronic medical records and have personally reviewed imaging films.  Patient has been complaining of increasing headaches for the last 2 months.  She describes this headache as being right temporal and frontal mostly throbbing in nature though fluctuating in severity from 6/10 to 10/10.  Headache is accompanied by nausea vomiting light and sound sensitivity.  She has to stop her activities and lie down and sleeping as well as taking Tylenol seems to help.  Headaches are accompanied by a single vision spots and scintillations and blurred vision.  She denies any speech difficulties, vertigo, diplopia, gait or balance difficulties with headaches.  Headaches usually last about 30 to 40 minutes.  Headaches of late have been occurring about 2-3 times per week.  She does give a remote history of infrequent headaches which sound like migraine headaches and was similar but were mostly perimenstrual and not bad.  The patient also states  in September 2019 she had a episode of speech difficulties as well as dizziness.  She was seen by primary care physician who states that the patient had a headache as well as feeling of dizziness and some speech difficulties.  This happened following a stressful event.  An outpatient MRI was ordered which was done on 08/05/2018 which I personally reviewed shows a few nonspecific white matter hyperintensities but no acute abnormality or definite stroke.  Patient states her symptoms improved within a few days but the headaches have persisted since then.  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 MRA of the brain showed slight diminished peripheral branches in the left MCA but no large vessel stenosis or occlusion. Carotid ultrasound showed no significant extracranial stenosis. Transthoracic echo showed normal ejection fraction. Transesophageal echocardiogram showed no cardiac source of embolism or PFO. Antiphospholipid antibodies and lupus anticoagulant were both negative. Lipid profile showed elevated total cholesterol 207 and LDL 139 mg percent. Hemoglobin A1c was 5.6. Patient was started on aspirin as well as protocol. She states her speech difficulty and right-sided weakness completely improved.  She was placed on aspirin and pravastatin which she actually did well on but for unclear reason she discontinued both medications.  She is currently on Lipitor 40 which  was started a few months ago by primary physician but she is complaining of dizziness on this medication and wants to change it or stop it.  She states she had slight balance difficulties from her previous stroke but had recovered well from that.  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  She states her blood pressure is under good  control.  Last lipid profile checked in August 2019 showed LDL of 120 following which Lipitor was added.  I had recommended doing a transcranial Doppler bubble study at last visit with me in 2017 but for some unclear reason this was never done.  Update 12/30/2018: Carla [name] is being seen today for follow-up visit for migraines and is accompanied by interpreter as patient is primarily Spanish-speaking.  Upon initial visit with Dr. Pearlean Brownie on 09/29/2018 (visit info below), headaches were not frequent enough to justify initiating migraine prophylaxis.  She did call back on 12/02/2018 reporting daily migraines and therefore initiated Topamax 25 mg twice daily.  She took this medication for 4 days but discontinued as medication was making her too fatigued.  She consider taking 1 tab at night only but wanted to speak with [place] in regards to this first.  She was evaluated in the ED on 12/19/2018 and returned on 12/23/2018 due to vaginal bleeding.  Apparently, all medications discontinued after 12/23/2018 visit and was started on Provera for 10-day course with recommending follow-up with OB/GYN for further evaluation.  She reports today that her headaches have improved and not experience a headache since ED admission on 12/23/2018.  She does have an appointment scheduled at [facility] on 01/12/2019 for further evaluation.  She denies any continued vaginal bleeding at this time.  Blood pressure today satisfactory 130/68. It was recommended after prior visit to undergo transcranial Doppler with bubble study to assess for possible PFO.  This was obtained on 10/14/2018 and was negative for PFO.  Update 06/21/2019 : She returns for follow-up after last visit 5 months ago.  She is accompanied by a [place] language interpreter who was present throughout the visit and interprets for her.  Patient states her main complaint today is that her migraine headaches seem to have come back.  In the last 3 months she has had increased  frequency of headaches which occur 3 to 4 days/week.  The headache starts off as a throbbing pain around her right temple as well as periorbital region becomes unbearable 10/10 in severity.  She has relied on.  Light and sound do worsen the headache.  She does not throw up or feel nauseous.  She does not have visual symptoms with the headaches.  Headache may last for most of the day.  She is unable to identify specific triggers for headaches but does feel tightness in the back of her head and neck muscles.  She has not tried triptans or prescription migraine specific medications she has tried taking Tylenol and ibuprofen without relief.  She has been taking naproxen which seems to work better.  She has been taking them 3 to 4 days a week.  She was previously on Topamax but when she started having dysfunctional uterine bleeding all the medications were stopped including Topamax.  She is unable to tell me if she had any bad side effects on it.  Patient recently changed her primary care physician and a new physician assistant discontinued protocol for unclear reason.  She remains on aspirin which is tolerating well without  bruising or bleeding.  Her blood pressure is under good control and today it is 142/83.  She has had no recurrent stroke or TIA symptoms.  Update 09/02/2019: Carla [name] is being seen today for 33-month follow-up regarding migraine headaches accompanied by interpreter.  After prior visit, Topamax 25 mg daily was restarted due to worsening migraines as well as Relpax for emergent relief.  She did not tolerate Topamax as she reports this caused weight loss and short-term memory loss.  She was unable to afford Relpax.  She has recently been experiencing migraines 2 times weekly and typically lasts for 1 hour total but during this time, migraines are debilitating and continues to be located right temporal area.  She has not trialed any other type of migraine medication.  Recent diagnosis of COVID-19 in  06/2019 and feels as though she has not completely recovered mentally and physically.  After prior visit, lipid panel obtained and recommended restart pravastatin but due to side effects, PCP initiated Crestor.  She reports difficulty tolerating Crestor but plans on following up with PCP for further monitoring and management.  Blood pressure today 135/91.  Update 11/04/2019: Carla [name] is a 49 year old female who is being seen today by patient request due to concerns of balance difficulties concern for stroke.  She is accompanied today by a interpreter.  She did not seek medical treatment at onset.  Approximately 1.5 weeks ago, she states she started experiencing intermittent balance impairment with a swaying type sensation and room spinning.  Symptoms severe where she will have to hold onto something and close her eyes.  She states she experienced same type of symptoms with prior stroke.  She denies associated headache, neck pain, speech difficulty, visual changes, weakness or numbness/tingling.  She denies increased stress or anxiety/depression.  She states standing straight up or laying flat or with head movement induces the symptoms.  She denies currently feeling symptoms at today's visit.  She does endorse recently stopping her metoprolol 3 days ago due to increased stress and irritability symptoms.  She does monitor blood pressure at home and SBP 130s.  She is unable to endorse diastolic pressures or heart rates.  Blood pressure today satisfactory at 132/96.  He does endorse continuation of aspirin 81 mg daily and recently started on pravastatin approximately 3 weeks ago by PCP.  Update 11/09/2019: Carla [name] is a 49 year old female who is being seen today by patient request due to recent ED admission on 11/06/2019 for ongoing concerns of dizziness previously evaluated in office 5 days ago.  She is accompanied by interpreter.  Per review of ED note, she reported worsening dizziness and headache 2 days  prior to ED admission concerning for stroke.  CT head and MRI brain no acute abnormalities.  She was advised to follow-up with PCP and neurology at discharge.  She has been doing well since discharge without any reoccurring vertigo or dizziness symptoms or headaches.  She has continued to take meclizine with benefit 25 mg tablets twice daily.  She continues on aspirin 81 mg daily and pravastatin for secondary stroke prevention without side effects. Blood pressure today 124/71.  No further concerns at this time.  Update 01/04/2020: Carla [name] is a 49 year old female who is being seen today for follow-up regarding prior concerns of dizziness accompanied by interpreter.  Denies residual/dizziness symptoms.  Continues twice daily meclizine with occasional fatigue.  Continues on aspirin 81 mg daily and pravastatin for secondary stroke prevention without side effects.  Blood pressure today 127/78.  Mild short-term memory loss but endorses ongoing improvement.  No concerns at this time.    ROS:   14 system review of systems is positive for see HPI and all other systems negative  PMH:  Past Medical History:  Diagnosis Date  . COVID-19   . Depression   . Frequent headaches   . 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 Hyperlipidemia   . Hypertension   . Stroke (HCC)    09/2015  . Vertigo     Social History:  Social History   Socioeconomic History  . Marital status: Married    Spouse name: Not on file  . Number of children: Not on file  . Years of education: Not on file  . Highest education level: Not on file  Occupational History  . Occupation: Financial trader  Tobacco Use  . Smoking status: Never Smoker  . Smokeless tobacco: Never Used  Vaping Use  . Vaping Use: Never used  Substance and Sexual Activity  . Alcohol use: No  . Drug use: No  . Sexual activity: Not Currently    Birth control/protection: None  Other Topics Concern  . Not on file  Social History Narrative   From [place]   Lived  in [place] for 20 years, and been in [place] for 6 years   Husband and son, 4   Works: clean homes   Social Determinants of Corporate investment banker Strain:   . Difficulty of Paying Living Expenses: Not on file  Food Insecurity:   . Worried About Programme researcher, broadcasting/film/video in the Last Year: Not on file  . Ran Out of Food in the Last Year: Not on file  Transportation Needs:   . Lack of Transportation (Medical): Not on file  . Lack of Transportation (Non-Medical): Not on file  Physical Activity:   . Days of Exercise per Week: Not on file  . Minutes of Exercise per Session: Not on file  Stress:   . Feeling of Stress : Not on file  Social Connections:   . Frequency of Communication with Friends and Family: Not on file  . Frequency of Social Gatherings with Friends and Family: Not on file  . Attends Religious Services: Not on file  . Active Member of Clubs or Organizations: Not on file  . Attends Banker Meetings: Not on file  . Marital Status: Not on file  Intimate Partner Violence:   . Fear of Current or Ex-Partner: Not on file  . Emotionally Abused: Not on file  . Physically Abused: Not on file  . Sexually Abused: Not on file    Medications:   Current Outpatient Medications on File Prior to Visit  Medication Sig Dispense Refill  . amitriptyline (ELAVIL) 25 MG tablet Take 1 tablet (25 mg total) by mouth at bedtime. 90 tablet 3  . ASPIRIN LOW DOSE 81 MG EC tablet Take 81 mg by mouth daily.    . fenofibrate 54 MG tablet Take 54 mg by mouth daily. with food    . fish oil-omega-3 fatty acids 1000 MG capsule Take 2 capsules (2 g total) by mouth daily.    . metoprolol tartrate (LOPRESSOR) 25 MG tablet Take 0.5 tablets (12.5 mg total) by mouth 2 (two) times daily. 90 tablet 1  . naproxen (NAPROSYN) 500 MG tablet Take 1 tablet (500 mg total) by mouth 2 (two) times daily. 30 tablet 0  . triamcinolone cream (KENALOG) 0.1 % Apply to affected area 1-2 times daily 30  g 0  . venlafaxine XR  (EFFEXOR XR) 37.5 MG 24 hr capsule Take 1 capsule (37.5 mg total) by mouth daily with breakfast. 90 capsule 3   No current facility-administered medications on file prior to visit.    Allergies:   Allergies  Allergen Reactions  . Lipitor [Atorvastatin Calcium] Swelling    throat  . Pravastatin Anxiety  . Tramadol Nausea Only    Physical Exam  Today's Vitals   10/16/20 0755  BP: 125/76  Pulse: (!) 122  Weight: 140 lb 12.8 oz (63.9 kg)  Height: 5\' 4"  (1.626 m)   Body mass index is 24.17 kg/m.  Physical exam General: well developed, well nourished, pleasant middle-aged Hispanic female, seated, in no apparent distress Head: head normocephalic and atraumatic.   Neck: supple with no carotid or supraclavicular bruits Cardiovascular: regular rate and rhythm, no murmurs Musculoskeletal: no deformity Skin:  no rash/petichiae Vascular:  Normal pulses all extremities  Neurologic Exam Mental Status: awake and fully alert. Primarily Spanish-speaking with limited English. Denies speech and language concerns.  Oriented to place and time. Recent and remote memory intact. Attention span, concentration and fund of knowledge appropriate. Mood and affect appropriate Cranial Nerves: Pupils equal, briskly reactive to light.  Extraocular movements full without nystagmus. Visual fields full to confrontation. Hearing intact. Facial sensation intact. Face, tongue, palate moves normally and symmetrically.   Motor: Normal bulk and tone. Normal strength in all tested extremity muscles. Sensory.: intact to touch , pinprick , position and vibratory sensation.  Coordination: Rapid alternating movements normal in all extremities. Finger-to-nose and heel-to-shin performed accurately bilaterally. Gait and Station: Arises from chair without difficulty. Stance is normal. Gait demonstrates normal stride length and balance . Able to heel, toe and tandem walk without difficulty.  Romberg negative. Reflexes: 1+ and  symmetric. Toes downgoing.      ASSESSMENT/PLAN:  Carla [name] is a 49 year old Hispanic lady with with PMH of cryptogenic stroke 2016, TIA, HDL, HTN, BPPV, anxiety and migraines.  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  Denies recurrence of associated neurological symptoms -Only occasional tension type headaches but overall improved -reported experiencing every other day at prior visit -continue amitriptyline 25 mg nightly and Effexor 37.5 mg daily for migraine prophylaxis -hx of intolerance to Topamax  Vertigo, intermittent -Overall stable with occasional use of meclizine with benefit -Continue meclizine 25 mg daily as needed -refill provided -pt reports full evaluation by ENT Dr. Suszanne Conners - will attempt to obtain evaluation records as unable to view via epic  History of stroke/TIA -Continue aspirin 81 mg and omega-3 for secondary stroke prevention.  History of statin intolerance -Continue to follow with PCP for HTN and HLD management and secondary stroke risk factor management with BP goal<130/90 and LDL goal<70   Follow-up in 6 months or call earlier if needed  CC:  [facility] provider: Dr. Tyler Deis, [place], [place]    I spent 35 minutes of face-to-face and non-face-to-face time with patient assisted by interpreter.  This included previsit chart review, lab review, study review, order entry, electronic health record documentation, patient education regarding migraine headaches and ongoing use of preventative medications, history of vertigo, history of stroke/TIA and importance of managing stroke risk factors and  answered  all other questions to patient satisfaction   Ihor Austin, [facility]  [facility] [address] [place], [place] [address]  Phone [phone] Fax [phone] Note: This document was prepared with digital dictation and possible smart phrase technology. Any transcriptional errors that result from this process are unintentional.

## 2020-10-17 NOTE — Progress Notes (Signed)
I agree with the above plan 

## 2020-11-07 ENCOUNTER — Other Ambulatory Visit: Payer: Self-pay | Admitting: Physician Assistant

## 2021-01-19 ENCOUNTER — Telehealth: Payer: Self-pay

## 2021-01-19 MED ORDER — FENOFIBRATE 54 MG PO TABS: 54.0000 mg | ORAL_TABLET | Freq: Every day | ORAL | 0 refills | Status: DC

## 2021-01-19 NOTE — Telephone Encounter (Signed)
Rx sent to pharmacy   

## 2021-01-19 NOTE — Telephone Encounter (Signed)
.   LAST APPOINTMENT DATE: 11/07/2020   NEXT APPOINTMENT DATE:n/a  MEDICATION:fenofibrate 54 MG tablet   PHARMACY:[id number] - [place], [place] - [place]    OTHER COMMENTS:    Okay for refill?  Please advise

## 2021-02-11 ENCOUNTER — Other Ambulatory Visit: Payer: Self-pay | Admitting: Physician Assistant

## 2021-03-28 ENCOUNTER — Telehealth: Payer: Self-pay | Admitting: Adult Health

## 2021-03-28 NOTE — Telephone Encounter (Signed)
Spoke to pt and she will all pharmacy, she should have another refill available.  She appreciated call back.

## 2021-03-28 NOTE — Telephone Encounter (Signed)
Pt is requesting a refill for amitriptyline (ELAVIL) 25 MG tablet .  Pharmacy: [id number]

## 2021-04-03 ENCOUNTER — Encounter: Payer: Self-pay | Admitting: Physician Assistant

## 2021-04-03 ENCOUNTER — Ambulatory Visit (INDEPENDENT_AMBULATORY_CARE_PROVIDER_SITE_OTHER): Payer: Medicaid Other

## 2021-04-03 ENCOUNTER — Telehealth (INDEPENDENT_AMBULATORY_CARE_PROVIDER_SITE_OTHER): Payer: Self-pay | Admitting: Physician Assistant

## 2021-04-03 VITALS — BP 127/85 | HR 104 | Temp 98.0°F | Ht 64.0 in | Wt 150.0 lb

## 2021-04-03 DIAGNOSIS — I10 Essential (primary) hypertension: Secondary | ICD-10-CM

## 2021-04-03 DIAGNOSIS — I639 Cerebral infarction, unspecified: Secondary | ICD-10-CM

## 2021-04-03 DIAGNOSIS — R071 Chest pain on breathing: Secondary | ICD-10-CM

## 2021-04-03 DIAGNOSIS — E785 Hyperlipidemia, unspecified: Secondary | ICD-10-CM

## 2021-04-03 LAB — CBC WITH DIFFERENTIAL/PLATELET
Basophils Absolute: 0 10*3/uL (ref 0.0–0.1)
Basophils Relative: 0.6 % (ref 0.0–3.0)
Eosinophils Absolute: 0.1 10*3/uL (ref 0.0–0.7)
Eosinophils Relative: 2.2 % (ref 0.0–5.0)
HCT: 39.8 % (ref 36.0–46.0)
Hemoglobin: 13.5 g/dL (ref 12.0–15.0)
Lymphocytes Relative: 31.1 % (ref 12.0–46.0)
Lymphs Abs: 2 10*3/uL (ref 0.7–4.0)
MCHC: 33.9 g/dL (ref 30.0–36.0)
MCV: 88.5 fl (ref 78.0–100.0)
Monocytes Absolute: 0.5 10*3/uL (ref 0.1–1.0)
Monocytes Relative: 7.4 % (ref 3.0–12.0)
Neutro Abs: 3.8 10*3/uL (ref 1.4–7.7)
Neutrophils Relative %: 58.7 % (ref 43.0–77.0)
Platelets: 356 10*3/uL (ref 150.0–400.0)
RBC: 4.49 Mil/uL (ref 3.87–5.11)
RDW: 13.2 % (ref 11.5–15.5)
WBC: 6.5 10*3/uL (ref 4.0–10.5)

## 2021-04-03 LAB — HEMOGLOBIN A1C: Hgb A1c MFr Bld: 5.7 % (ref 4.6–6.5)

## 2021-04-03 LAB — COMPREHENSIVE METABOLIC PANEL
ALT: 20 U/L (ref 0–35)
AST: 20 U/L (ref 0–37)
Albumin: 4.4 g/dL (ref 3.5–5.2)
Alkaline Phosphatase: 83 U/L (ref 39–117)
BUN: 13 mg/dL (ref 6–23)
CO2: 30 mEq/L (ref 19–32)
Calcium: 9.8 mg/dL (ref 8.4–10.5)
Chloride: 104 mEq/L (ref 96–112)
Creatinine, Ser: 0.83 mg/dL (ref 0.40–1.20)
GFR: 82.19 mL/min (ref >60.00)
Glucose, Bld: 104 mg/dL — ABNORMAL HIGH (ref 70–99)
Potassium: 4.2 mEq/L (ref 3.5–5.1)
Sodium: 141 mEq/L (ref 135–145)
Total Bilirubin: 0.5 mg/dL (ref 0.2–1.2)
Total Protein: 7.7 g/dL (ref 6.0–8.3)

## 2021-04-03 LAB — LIPID PANEL
Cholesterol: 208 mg/dL — ABNORMAL HIGH (ref 0–200)
HDL: 55.5 mg/dL (ref >39.00)
LDL Cholesterol: 122 mg/dL — ABNORMAL HIGH (ref 0–99)
NonHDL: 152.23
Total CHOL/HDL Ratio: 4
Triglycerides: 150 mg/dL — ABNORMAL HIGH (ref 0.0–149.0)
VLDL: 30 mg/dL (ref 0.0–40.0)

## 2021-04-03 NOTE — Progress Notes (Signed)
Carla [name] is a 50 y.o. female here for a new problem.  History of Present Illness:   Chief Complaint  Patient presents with  . Back Pain    On and off Upper right side,x 1week  SHOB When the pain comes vision gets blurry and nausea  No hx of injury    HPI  Mid L thoracic back pain and L-sided chest pain Hx of HLD, cryptogenic stroke maintained on ASA and fenofibrate (unable to tolerate statins in the past due to multiple side effects.) Patient reports that symptoms have been going on for two weeks. She has left-sided chest pain that does not worsen with activity but if she sits it bothers her more. Has numbness to L arm at times. Endorses worsening anxiety. Complains of mid-L back pain that worsens with deep inspiration, currently 8/10 but can be 10/10. Has not had any medication for these symptoms. If the pain is severe she can have some nausea and blurred vision.   Denies: cough, hemoptysis, calf pain, double vision, weakness on one side of the body, abdominal pain, body aches, dysuria  She is taking all rx medications as prescribed. She remains inconsistent with statin use and is unable to tell me why she is no longer on lipitor.  Of note, she went to ER on 06/13/20 for similar symptoms.  Past Medical History:  Diagnosis Date  . COVID-19   . Depression   . Frequent headaches   . 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 Hyperlipidemia   . Hypertension   . Stroke ([place])    09/2015  . Vertigo      Social History   Tobacco Use  . Smoking status: Never Smoker  . Smokeless tobacco: Never Used  Vaping Use  . Vaping Use: Never used  Substance Use Topics  . Alcohol use: No  . Drug use: No    Past Surgical History:  Procedure Laterality Date  . RIGHT ARM SURGERY    . TEE WITHOUT CARDIOVERSION N/A 10/13/2015   Procedure: TRANSESOPHAGEAL ECHOCARDIOGRAM (TEE);  Surgeon: Lelon Perla, MD;  Location: [facility] ENDOSCOPY;  Service: Cardiovascular;  Laterality: N/A;    Family History   Problem Relation Age of Onset  . Leukemia Mother   . Colon cancer Brother   . Asthma Neg Hx   . Cancer Neg Hx   . Diabetes Neg Hx   . Hyperlipidemia Neg Hx   . Heart failure Neg Hx   . Hypertension Neg Hx   . Migraines Neg Hx   . Rashes / Skin problems Neg Hx   . Seizures Neg Hx   . Stroke Neg Hx   . Thyroid disease Neg Hx     Allergies  Allergen Reactions  . Lipitor [Atorvastatin Calcium] Swelling    throat  . Pravastatin Anxiety  . Tramadol Nausea Only    Current Medications:   Current Outpatient Medications:  .  amitriptyline (ELAVIL) 25 MG tablet, Take 1 tablet (25 mg total) by mouth at bedtime., Disp: 90 tablet, Rfl: 3 .  ASPIRIN LOW DOSE 81 MG EC tablet, Take 81 mg by mouth daily., Disp: , Rfl:  .  fenofibrate 54 MG tablet, TAKE 1 TABLET (54 MG TOTAL) BY MOUTH DAILY. WITH FOOD, Disp: 30 tablet, Rfl: 0 .  fish oil-omega-3 fatty acids 1000 MG capsule, Take 2 capsules (2 g total) by mouth daily., Disp:  , Rfl:  .  meclizine (ANTIVERT) 25 MG tablet, Take 1 tablet (25 mg total) by mouth 2 (two) times  daily as needed for dizziness., Disp: 90 tablet, Rfl: 0 .  naproxen (NAPROSYN) 500 MG tablet, Take 1 tablet (500 mg total) by mouth 2 (two) times daily., Disp: 30 tablet, Rfl: 0 .  triamcinolone cream (KENALOG) 0.1 %, Apply to affected area 1-2 times daily, Disp: 30 g, Rfl: 0 .  metoprolol tartrate (LOPRESSOR) 25 MG tablet, TAKE 0.5 TABLETS (12.5 MG TOTAL) BY MOUTH 2 (TWO) TIMES DAILY., Disp: 90 tablet, Rfl: 1 .  venlafaxine XR (EFFEXOR XR) 37.5 MG 24 hr capsule, Take 1 capsule (37.5 mg total) by mouth daily with breakfast. (Patient not taking: Reported on 04/03/2021), Disp: 90 capsule, Rfl: 3   Review of Systems:   ROS  Negative unless otherwise specified per HPI.  Vitals:   Vitals:   04/03/21 1011  BP: 127/85  Pulse: (!) 104  Temp: 98 F (36.7 C)  TempSrc: Temporal  SpO2: 100%  Weight: 150 lb (68 kg)  Height: 5\' 4"  (1.626 m)     Body mass index is 25.75  kg/m.  Physical Exam:   Physical Exam Vitals and nursing note reviewed.  Constitutional:      General: She is not in acute distress.    Appearance: She is well-developed. She is not ill-appearing or toxic-appearing.  Cardiovascular:     Rate and Rhythm: Regular rhythm. Tachycardia present.     Pulses: Normal pulses.     Heart sounds: Normal heart sounds, S1 normal and S2 normal.     Comments: No LE edema Pulmonary:     Effort: Pulmonary effort is normal.     Breath sounds: Normal breath sounds.  Skin:    General: Skin is warm and dry.  Neurological:     Mental Status: She is alert.     GCS: GCS eye subscore is 4. GCS verbal subscore is 5. GCS motor subscore is 6.  Psychiatric:        Mood and Affect: Mood is anxious.        Speech: Speech normal.        Behavior: Behavior normal. Behavior is cooperative.     Assessment and Plan:   Carla [name] was seen today for back pain.  Diagnoses and all orders for this visit:  Chest pain on breathing EKG tracing is personally reviewed.  EKG notes NSR.  No acute changes.  Wells score 1.5 -- will obtain D-Dimer due to tachycardia. Recommend NSAIDs or Tylenol to see if this helps with MSK pain. Update xray and blood work. If worsening symptoms, recommended to proceed to ER. Plan to call patient tomorrow to check in on her symptoms. -     EKG 12-Lead -     DG Chest 2 View; Future -     CBC with Differential/Platelet -     Comprehensive metabolic panel -     D-Dimer, Quantitative  Hyperlipidemia, unspecified hyperlipidemia type Update lipid panel today.  Revisit statin if LDL remains above >70 mg/dL. Consider referral to lipid clinic if she remains resistant. -     Lipid panel  Essential hypertension Normotensive today. Continue Lopressor 12.5 mg BID. Follow-up with [place] or cardiology in 6 months, sooner if concerns.  Cryptogenic stroke (HCC) Continue ASA 81 mg daily. Continue follow-up with neuro -- has appointment later this month  with Carla Rider, NP. -     Hemoglobin A1c   Discussed reasons for ER evaluation and provided this list on AVS.  [name], PA-C

## 2021-04-03 NOTE — Patient Instructions (Addendum)
It was great to see you!  EKG looks okay.  I would like to get additional blood work today and chest xray.  Please trial tylenol or ibuprofen for your symptoms.  We will call you tomorrow to check in.   Dolor de pecho inespecfico en los adultos Nonspecific Chest Pain, Adult El dolor de pecho puede deberse a muchas afecciones diferentes. Puede ser provocado por una afeccin que es potencialmente mortal y requiere tratamiento hospitalario de inmediato. Tambin puede ser provocado por algo que no es potencialmente mortal. Si tiene dolor de [place], puede ser difcil saber la diferencia, por lo tanto es importante que obtenga ayuda de inmediato para asegurarse de que no tiene una afeccin grave. Algunas causas potencialmente mortales del dolor de pecho incluyen:  Infarto de miocardio.  Un desgarro en el vaso sanguneo principal del cuerpo (diseccin artica).  Inflamacin alrededor del corazn (pericarditis).  Un problema en los pulmones, como un cogulo de sangre (embolia pulmonar) o un pulmn colapsado (neumotrax). Algunas causas de dolor de pecho que no son potencialmente mortales incluyen:  Merchant navy officer.  Ansiedad o estrs.  [place], los msculos y los cartlagos que conforman la pared torcica.  Neumona o bronquitis.  Culebrilla (virus de la varicela zster). El dolor de pecho puede provocar las siguientes sensaciones:  Dolor o molestias en la superficie o en lo profundo del pecho.  Dolor opresivo, continuo o constrictivo.  Ardor u hormigueo.  Dolor sordo o intenso que empeora al [employer], toser o inhalar profundamente.  Dolor o [employer] tambin se sienten en la espalda, el cuello, la [place], el hombro o el brazo, o dolor que se extiende a cualquiera de estas zonas. El dolor de pecho puede aparecer y Armed forces operational officer. Tambin puede ser constante. Su mdico le har anlisis clnicos y otros estudios para tratar de Office manager causa del dolor. El  tratamiento depender de la causa del dolor de [place]. Siga estas indicaciones en su casa: Medicamentos  [employer] de venta libre y los recetados solamente como se lo haya indicado el mdico.  Si le recetaron un antibitico, tmelo o selo como se lo haya indicado el mdico. No deje de tomar los antibiticos aunque comience a Sports administrator. Estilo de vida  Haga reposo como se lo haya indicado el mdico.  No consuma ningn producto que contenga nicotina o tabaco, como cigarrillos y Psychologist, sport and exercise. Si necesita ayuda para dejar de fumar, consulte al mdico.  No beba alcohol.  Opte por un estilo de vida saludable segn lo recomendado. Esto puede incluir lo siguiente: ? Practicar actividad fsica con regularidad. Pida al mdico que le sugiera algunas actividades que sean seguras para usted. ? Seguir una dieta cardiosaludable. Esta debe incluir muchas frutas y verduras frescas, cereales integrales, protenas (magras) con bajo contenido de grasa y productos lcteos bajos en grasa. Un nutricionista podr ayudarlo a hacer elecciones de alimentacin saludables. ? Mantener un peso saludable. ? Controlar cualquier otra afeccin que tenga, como presin arterial alta (hipertensin) o diabetes. ? Disminuir el nivel de estrs; por ejemplo, con yoga o tcnicas de relajacin.   Indicaciones generales  Est atento a cualquier cambio en los sntomas. Informe al [employer] o si tiene sntomas nuevos.  Evite las [employer] causen dolor de [place].  Concurra a todas las visitas de seguimiento como se lo haya indicado el mdico. Esto es importante. [place] visitas para realizarle otros estudios si el dolor de pecho no desaparece. Comunquese con un mdico  si:  El dolor de pecho no desaparece.  Se siente deprimido.  Tiene fiebre. Solicite ayuda inmediatamente si:  El Psychologist, educational.  Tiene tos que empeora o tose con [place].  Siente un dolor  intenso en el abdomen.  Se desmaya.  Tiene una molestia repentina e inexplicable en el pecho.  Tiene molestias repentinas e [employer], la espalda, el cuello o la [place].  Le falta el aire en cualquier momento.  Comienza a sudar de [place] repentina o la piel se le humedece.  Siente nuseas o vomita.  Se siente repentinamente mareado o se desmaya.  Tiene debilidad intensa, o debilidad o fatiga sin explicacin.  Siente que el corazn comienza a latir rpidamente o que se saltea latidos. Estos sntomas pueden representar un problema grave que constituye Engineer, maintenance (IT). No espere a ver si los sntomas desaparecen. Solicite atencin mdica de inmediato. Comunquese con el servicio de emergencias de su localidad (911 en los [place]). No conduzca por sus propios medios [employer] Financial. Resumen  El dolor de pecho puede ser provocado por una afeccin que es grave y requiere tratamiento urgente. Tambin puede ser provocado por algo que no es potencialmente mortal.  Si tiene dolor de [place], es muy importante que consulte con el mdico. El mdico podr hacerle anlisis clnicos y 49 estudios para tratar de Office manager causa del dolor.  Siga las indicaciones de su mdico sobre tomar [employer], Field seismologist cambios en su estilo de vida y recibir tratamiento de urgencia si los sntomas empeoran.  Concurra a todas las visitas de seguimiento como se lo haya indicado el mdico. Esto incluye las visitas para realizarle otros estudios si el dolor de pecho no desaparece. Esta informacin no tiene Marine scientist el consejo del mdico. Asegrese de hacerle al mdico cualquier pregunta que tenga. Document Revised: 07/13/2018 Document Reviewed: 07/13/2018 Elsevier Patient Education  [place].

## 2021-04-04 ENCOUNTER — Telehealth: Payer: Medicaid Other | Admitting: Physician Assistant

## 2021-04-04 LAB — D-DIMER, QUANTITATIVE: D-Dimer, Quant: <0.19 mcg/mL FEU (ref <0.50)

## 2021-04-04 NOTE — Progress Notes (Signed)
Please inform patient of the following:  Her D-dimer was negative.  She does not have a blood clot.  Her cholesterol and blood sugar both borderline elevated.  Looks like [facility] wanted her to start a statin.  Please send in Lipitor 40 mg daily if she is willing to start.  All of her other labs are normal.  Would like for her to let us know if her musculoskeletal pain is not improving.

## 2021-04-05 NOTE — Progress Notes (Signed)
Please inform patient of the following:   Her chest Xray is normal.  Devron Cohick M. Jerline Pain, MD 04/05/2021 8:17 AM

## 2021-04-06 ENCOUNTER — Other Ambulatory Visit: Payer: Self-pay | Admitting: *Deleted

## 2021-04-06 ENCOUNTER — Emergency Department (HOSPITAL_COMMUNITY): Payer: Medicaid Other

## 2021-04-06 ENCOUNTER — Other Ambulatory Visit: Payer: Self-pay

## 2021-04-06 ENCOUNTER — Emergency Department (HOSPITAL_COMMUNITY)
Admission: EM | Admit: 2021-04-06 | Discharge: 2021-04-06 | Disposition: A | Discharge status: Home / Self Care | Payer: Medicaid Other | Attending: Emergency Medicine | Admitting: Emergency Medicine

## 2021-04-06 ENCOUNTER — Telehealth: Payer: Self-pay

## 2021-04-06 ENCOUNTER — Encounter (HOSPITAL_COMMUNITY): Payer: Self-pay

## 2021-04-06 DIAGNOSIS — Z8616 Personal history of COVID-19: Secondary | ICD-10-CM | POA: Insufficient documentation

## 2021-04-06 DIAGNOSIS — Z79899 Other long term (current) drug therapy: Secondary | ICD-10-CM | POA: Insufficient documentation

## 2021-04-06 DIAGNOSIS — R519 Headache, unspecified: Secondary | ICD-10-CM | POA: Insufficient documentation

## 2021-04-06 DIAGNOSIS — N3001 Acute cystitis with hematuria: Secondary | ICD-10-CM | POA: Insufficient documentation

## 2021-04-06 DIAGNOSIS — Z7982 Long term (current) use of aspirin: Secondary | ICD-10-CM | POA: Insufficient documentation

## 2021-04-06 DIAGNOSIS — Z8673 Personal history of transient ischemic attack (TIA), and cerebral infarction without residual deficits: Secondary | ICD-10-CM | POA: Insufficient documentation

## 2021-04-06 DIAGNOSIS — I10 Essential (primary) hypertension: Secondary | ICD-10-CM | POA: Insufficient documentation

## 2021-04-06 LAB — URINALYSIS, ROUTINE W REFLEX MICROSCOPIC
Bilirubin Urine: NEGATIVE
Glucose, UA: NEGATIVE mg/dL
Ketones, ur: NEGATIVE mg/dL
Nitrite: NEGATIVE
Protein, ur: NEGATIVE mg/dL
Specific Gravity, Urine: 1.024 (ref 1.005–1.030)
pH: 6 (ref 5.0–8.0)

## 2021-04-06 LAB — CBC WITH DIFFERENTIAL/PLATELET
Abs Immature Granulocytes: 0.01 10*3/uL (ref 0.00–0.07)
Basophils Absolute: 0 10*3/uL (ref 0.0–0.1)
Basophils Relative: 1 %
Eosinophils Absolute: 0.2 10*3/uL (ref 0.0–0.5)
Eosinophils Relative: 2 %
HCT: 39 % (ref 36.0–46.0)
Hemoglobin: 12.8 g/dL (ref 12.0–15.0)
Immature Granulocytes: 0 %
Lymphocytes Relative: 32 %
Lymphs Abs: 2.8 10*3/uL (ref 0.7–4.0)
MCH: 29.6 pg (ref 26.0–34.0)
MCHC: 32.8 g/dL (ref 30.0–36.0)
MCV: 90.3 fL (ref 80.0–100.0)
Monocytes Absolute: 0.6 10*3/uL (ref 0.1–1.0)
Monocytes Relative: 7 %
Neutro Abs: 5.2 10*3/uL (ref 1.7–7.7)
Neutrophils Relative %: 58 %
Platelets: 367 10*3/uL (ref 150–400)
RBC: 4.32 MIL/uL (ref 3.87–5.11)
RDW: 12.3 % (ref 11.5–15.5)
WBC: 8.8 10*3/uL (ref 4.0–10.5)
nRBC: 0 % (ref 0.0–0.2)

## 2021-04-06 LAB — COMPREHENSIVE METABOLIC PANEL
ALT: 22 U/L (ref 0–44)
AST: 22 U/L (ref 15–41)
Albumin: 3.9 g/dL (ref 3.5–5.0)
Alkaline Phosphatase: 75 U/L (ref 38–126)
Anion gap: 9 (ref 5–15)
BUN: 12 mg/dL (ref 6–20)
CO2: 27 mmol/L (ref 22–32)
Calcium: 9.4 mg/dL (ref 8.9–10.3)
Chloride: 106 mmol/L (ref 98–111)
Creatinine, Ser: 0.95 mg/dL (ref 0.44–1.00)
GFR, Estimated: >60 mL/min (ref >60)
Glucose, Bld: 96 mg/dL (ref 70–99)
Potassium: 4.1 mmol/L (ref 3.5–5.1)
Sodium: 142 mmol/L (ref 135–145)
Total Bilirubin: 0.7 mg/dL (ref 0.3–1.2)
Total Protein: 7.1 g/dL (ref 6.5–8.1)

## 2021-04-06 MED ORDER — CEPHALEXIN 500 MG PO CAPS
500.0000 mg | ORAL_CAPSULE | Freq: Four times a day (QID) | ORAL | 0 refills | Status: AC
Start: 2021-04-06 — End: 2021-04-10

## 2021-04-06 NOTE — ED Provider Notes (Signed)
[facility] EMERGENCY DEPARTMENT Provider Note   CSN: [id number] Arrival date & time: 04/06/21  1720     History Chief Complaint  Patient presents with  . Back Pain    Carla [name] is a 50 y.o. female.  Patient presents ER with multiple complaints.  She states that she is been having left flank pain for about 3 weeks persistently every day.  Also complaining of tingling sensation down the left arm and a sharp throbbing pain in the right temporal region.  The throbbing of the arm and the right-sided headache have been ongoing for the past week.  She states that symptoms wax and wane and currently they are at a mild level.  She otherwise denies any fevers no cough no vomiting no diarrhea.  Denies any weakness or numbness at this time.        Past Medical History:  Diagnosis Date  . COVID-19   . Depression   . Frequent headaches   . 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 Hyperlipidemia   . Hypertension   . Stroke ([place])    09/2015  . Vertigo     Patient Active Problem List   Diagnosis Date Noted  . Essential hypertension 07/05/2019  . Left knee pain 01/08/2018  . 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 Premature ventricular contractions 03/25/2016  . Hyperlipidemia 03/25/2016  . Benign paroxysmal positional vertigo 03/11/2016  . Neck pain on right side 02/19/2016  . Cryptogenic stroke ([place]) 12/28/2015  . Multiple hemangiomas   . Cerebral infarction due to unspecified mechanism   . 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 Migraines 10/11/2015    Past Surgical History:  Procedure Laterality Date  . RIGHT ARM SURGERY    . TEE WITHOUT CARDIOVERSION N/A 10/13/2015   Procedure: TRANSESOPHAGEAL ECHOCARDIOGRAM (TEE);  Surgeon: Lelon Perla, MD;  Location: [facility] ENDOSCOPY;  Service: Cardiovascular;  Laterality: N/A;     OB History    Gravida  2   Para  2   Term  1   Preterm  1   AB  0   Living  2     SAB  0   IAB  0   Ectopic  0   Multiple  0   Live  Births  2           Family History  Problem Relation Age of Onset  . Leukemia Mother   . Colon cancer Brother   . Asthma Neg Hx   . Cancer Neg Hx   . Diabetes Neg Hx   . Hyperlipidemia Neg Hx   . Heart failure Neg Hx   . Hypertension Neg Hx   . Migraines Neg Hx   . Rashes / Skin problems Neg Hx   . Seizures Neg Hx   . Stroke Neg Hx   . Thyroid disease Neg Hx     Social History   Tobacco Use  . Smoking status: Never Smoker  . Smokeless tobacco: Never Used  Vaping Use  . Vaping Use: Never used  Substance Use Topics  . Alcohol use: No  . Drug use: No    Home Medications Prior to Admission medications   Medication Sig Start Date End Date Taking? Authorizing Provider  cephALEXin (KEFLEX) 500 MG capsule Take 1 capsule (500 mg total) by mouth 4 (four) times daily for 4 days. 04/06/21 04/10/21 Yes Luna Fuse, MD  amitriptyline (ELAVIL) 25 MG tablet Take 1 tablet (25 mg total) by mouth at bedtime. 06/27/20  Frann Rider, NP  ASPIRIN LOW DOSE 81 MG EC tablet Take 81 mg by mouth daily. 04/08/20   [provider]  fenofibrate 54 MG tablet TAKE 1 TABLET (54 MG TOTAL) BY MOUTH DAILY. WITH FOOD 02/11/21   [name], PA  fish oil-omega-3 fatty acids 1000 MG capsule Take 2 capsules (2 g total) by mouth daily. 03/21/20   Frann Rider, NP  meclizine (ANTIVERT) 25 MG tablet Take 1 tablet (25 mg total) by mouth 2 (two) times daily as needed for dizziness. 10/16/20   Frann Rider, NP  metoprolol tartrate (LOPRESSOR) 25 MG tablet TAKE 0.5 TABLETS (12.5 MG TOTAL) BY MOUTH 2 (TWO) TIMES DAILY. 11/07/20 02/05/21  [name], PA  naproxen (NAPROSYN) 500 MG tablet Take 1 tablet (500 mg total) by mouth 2 (two) times daily. [id number]   Delora Fuel, MD  triamcinolone cream (KENALOG) 0.1 % Apply to affected area 1-2 times daily 06/19/20   [name], PA  venlafaxine XR (EFFEXOR XR) 37.5 MG 24 hr capsule Take 1 capsule (37.5 mg total) by mouth daily with  breakfast. Patient not taking: Reported on 04/03/2021 06/27/20   Frann Rider, NP    Allergies    Lipitor [atorvastatin calcium], Pravastatin, and Tramadol  Review of Systems   Review of Systems  Constitutional: Negative for fever.  HENT: Negative for ear pain.   Eyes: Negative for pain.  Respiratory: Negative for cough.   Cardiovascular: Negative for chest pain.  Gastrointestinal: Negative for abdominal pain.  Genitourinary: Positive for flank pain.  Musculoskeletal: Negative for back pain.  Skin: Negative for rash.  Neurological: Positive for headaches.    Physical Exam Updated Vital Signs BP 138/85   Pulse 73   Temp 98.4 F (36.9 C) (Oral)   Resp 15   SpO2 100%   Physical Exam Constitutional:      General: She is not in acute distress.    Appearance: Normal appearance.  HENT:     Head: Normocephalic.     Nose: Nose normal.  Eyes:     Extraocular Movements: Extraocular movements intact.  Cardiovascular:     Rate and Rhythm: Normal rate.  Pulmonary:     Effort: Pulmonary effort is normal.  Musculoskeletal:        General: Normal range of motion.     Cervical back: Normal range of motion.  Neurological:     General: No focal deficit present.     Mental Status: She is alert and oriented to person, place, and time. Mental status is at baseline.     Cranial Nerves: No cranial nerve deficit.     Sensory: No sensory deficit.     Motor: No weakness.     ED Results / Procedures / Treatments   Labs (all labs ordered are listed, but only abnormal results are displayed) Labs Reviewed  URINALYSIS, ROUTINE W REFLEX MICROSCOPIC - Abnormal; Notable for the following components:      Result Value   APPearance HAZY (*)    Hgb urine dipstick SMALL (*)    Leukocytes,Ua LARGE (*)    Bacteria, UA RARE (*)    All other components within normal limits  COMPREHENSIVE METABOLIC PANEL  CBC WITH DIFFERENTIAL/PLATELET    EKG EKG Interpretation  Date/Time:  Friday Apr 06 2021 21:04:46 EDT Ventricular Rate:  74 PR Interval:  178 QRS Duration: 98 QT Interval:  371 QTC Calculation: 412 R Axis:   45 Text Interpretation: Sinus rhythm Confirmed by Thamas Jaegers ([id number]) on 04/06/2021 9:12:11 PM  Radiology DG Chest 2 View  Result Date: 04/06/2021 CLINICAL DATA:  Shortness of breath upper back pain EXAM: CHEST - 2 VIEW COMPARISON:  04/03/2021 FINDINGS: The heart size and mediastinal contours are within normal limits. Both lungs are clear. The visualized skeletal structures are unremarkable. IMPRESSION: No active cardiopulmonary disease. Electronically Signed   By: Donavan Foil M.D.   On: 04/06/2021 18:57   CT Head Wo Contrast  Result Date: 04/06/2021 CLINICAL DATA:  Transient ischemic attack. Pt stated she has high BP and cholesterol. She has headaches and nausea x 3 weeks. No hx of cancer or sx to ROI. Poss diabetes her dr said. EXAM: CT HEAD WITHOUT CONTRAST TECHNIQUE: Contiguous axial images were obtained from the base of the skull through the vertex without intravenous contrast. COMPARISON:  CT head 06/13/2020. FINDINGS: Brain: No evidence of large-territorial acute infarction. No parenchymal hemorrhage. No mass lesion. No extra-axial collection. No mass effect or midline shift. No hydrocephalus. Basilar cisterns are patent. Vascular: No hyperdense vessel. Skull: No acute fracture or focal lesion. Sinuses/Orbits: Paranasal sinuses and mastoid air cells are clear. The orbits are unremarkable. Other: Stable right periorbital scalp calcification (3:5). IMPRESSION: No acute intracranial abnormality. Electronically Signed   By: Iven Finn M.D.   On: 04/06/2021 19:39   CT Renal Stone Study  Result Date: 04/06/2021 CLINICAL DATA:  Flank pain, kidney stone suspected. EXAM: CT ABDOMEN AND PELVIS WITHOUT CONTRAST TECHNIQUE: Multidetector CT imaging of the abdomen and pelvis was performed following the standard protocol without IV contrast. COMPARISON:  MRI abdomen October 13, 2015 FINDINGS: Lower chest: No acute abnormality. Normal size heart. No significant pericardial effusion/thickening. Hepatobiliary: Similar size of the 2 large hypodense hepatic lesions in the right lobe of the liver the largest of which measures 10.3 cm, previously characterized as benign hepatic hemangiomas on prior MRI October 12, 2015. Gallbladder is unremarkable. No biliary ductal dilation. Pancreas: Within normal limits. Spleen: Within normal limits. Adrenals/Urinary Tract: Bilateral adrenal glands are unremarkable. No hydronephrosis. No renal or bladder calculus visualized. No contour deforming renal masses. Urinary bladder is grossly unremarkable for degree of distension. Stomach/Bowel: Stomach is grossly unremarkable for degree of distension. No pathologic dilation of small bowel. The appendix is grossly unremarkable. Small volume of formed stool throughout the colon. Sigmoid colonic diverticulosis without findings of acute diverticulitis. Vascular/Lymphatic: No significant vascular findings are present. No enlarged abdominal or pelvic lymph nodes. Reproductive: Mild nodular uterine contour likely representing leiomyomas. No adnexal lesions. Other: No abdominopelvic ascites. Musculoskeletal: Mild lower lumbar degenerative change. No acute osseous abnormality. IMPRESSION: 1. No acute abdominopelvic findings. Specifically, no evidence of obstructive uropathy. 2. Sigmoid colonic diverticulosis without findings of acute diverticulitis. 3. Similar size of the hypodense hepatic lesions in the right lobe of the liver previously characterized as benign hepatic hemangiomas on prior MRI October 12, 2015. Electronically Signed   By: Dahlia Bailiff MD   On: 04/06/2021 21:17    Procedures Procedures   Medications Ordered in ED Medications - No data to display  ED Course  I have reviewed the triage vital signs and the nursing notes.  Pertinent labs & imaging results that were available during my care of  the patient were reviewed by me and considered in my medical decision making (see chart for details).    MDM Rules/Calculators/A&P                          Labs unremarkable white count normal chemistry normal.  Urinalysis positive for large leukocytes small mount of blood.  CT of the head shows no acute pathology.  CT of the abdomen pelvis shows no evidence of kidney stone.  EKG shows sinus rhythm no ST elevations, no ST depressions noted, rate is normal.  Patient given antibiotics for her UTI, advised follow-up with her doctor within the week.  Advised immediate return for fevers worsening symptoms or any additional concerns.   Final Clinical Impression(s) / ED Diagnoses Final diagnoses:  Acute cystitis with hematuria    Rx / DC Orders ED Discharge Orders         Ordered    cephALEXin (KEFLEX) 500 MG capsule  4 times daily        04/06/21 2225           Luna Fuse, MD 04/06/21 2225

## 2021-04-06 NOTE — Telephone Encounter (Signed)
Nurse Assessment Nurse: Raenette Rover, RN, Zella Ball Date/Time (Eastern Time): 04/06/2021 1:34:11 PM Confirm and document reason for call. If symptomatic, describe symptoms. ---Caller has high blood pressure, headaches, and dizziness. Caller requesting test results. Doesn't know bp at this time unable to check it. Rates headache pain 8/10 at this time. Takes metoprolol 1/2 tablet in am and pm Does the patient have any new or worsening symptoms? ---Yes Will a triage be completed? ---Yes Related visit to physician within the last 2 weeks? ---No Does the PT have any chronic conditions? (i.e. diabetes, asthma, this includes High risk factors for pregnancy, etc.) ---Yes List chronic conditions. ---htn Is the patient pregnant or possibly pregnant? (Ask all females between the ages of 7-55) ---No Is this a behavioral health or substance abuse call? ---No Guidelines Guideline Title Affirmed Question Affirmed Notes Nurse Date/Time (Eastern Time) Headache Loss of vision or double vision Kellogg, RN, Zella Ball 04/06/2021 1:45:57 PM PLEASE NOTE: All timestamps contained within this report are represented as [place] Standard Time. CONFIDENTIALTY NOTICE: This fax transmission is intended only for the addressee. It contains information that is legally privileged, confidential or otherwise protected from use or disclosure. If you are not the intended recipient, you are strictly prohibited from reviewing, disclosing, copying using or disseminating any of this information or taking any action in reliance on or regarding this information. If you have received this fax in error, please notify us immediately by telephone so that we can arrange for its return to [place]. Phone: [phone], Toll-Free: [phone], Fax: [phone] Page: 2 of 2 Call Id: [id number] Guidelines Guideline Title Affirmed Question Affirmed Notes Nurse Date/Time Eilene Ghazi Time) (Exception: same as prior migraines) Disp. Time Eilene Ghazi Time)  Disposition Final User 04/06/2021 1:52:16 PM Go to ED Now (or PCP triage) Yes Raenette Rover, RN, Herbert Deaner Disagree/Comply Comply Caller Understands Yes PreDisposition Did not know what to do Care Advice Given Per Guideline GO TO ED NOW (OR PCP TRIAGE): ANOTHER ADULT SHOULD DRIVE: CARE ADVICE given per Headache (Adult) guideline. Comments User: Wilson Singer, RN Date/Time Eilene Ghazi Time): 04/06/2021 1:45:41 PM Having numbness in the arm and face intermittently. Not currently having it at this time. Has had some sob. User: Wilson Singer, RN Date/Time Eilene Ghazi Time): 04/06/2021 1:50:45 PM c/o having blurry vision. Feels hot and User: Wilson Singer, RN Date/Time Eilene Ghazi Time): 04/06/2021 1:55:24 PM May take some tylenol at this time for the headache and go to the ER. User: Wilson Singer, RN Date/Time Eilene Ghazi Time): 04/06/2021 1:55:59 PM ER to r/o stroke. Caller unable to check her bp. Referrals GO TO FACILITY OTHER - SPECIFY

## 2021-04-06 NOTE — ED Triage Notes (Addendum)
Pt c/o pain in left upper back, shortness of breath x 3 weeks. Pt states she has had a headache x 3 days, tremors, and left hand goes numb x 1 month.  Pt c/o right side of face twitching.  Pt states these symptoms are intermittent.  Pt has had continuous blurred vision. Pt states she went to PCP and was told her cholesterol is elevated and sugar is elevated. Pt takes ASA.

## 2021-04-06 NOTE — ED Provider Notes (Addendum)
Emergency Medicine Provider Triage Evaluation Note  Carla [name] , a 50 y.o. female  was evaluated in triage.  Pt complains of left sided back pain x 3weeks, headache x 3 days, numbness to left forearm intermittently over 1 month, twitching to right eye x 2weeks.  Patient denies any recent falls or injuries.  Patient has history of cryptogenic stroke 2016, TIA, HDL, hypertension, and migraines.  Patient is previously seen [name].    D-dimer drawn on 5/10 was within normal limits.  Interview was conducted using that interpreter  Review of Systems  Positive: Back pain, numbness to left leg, headache, nausea, blurry vision, Negative: Fevers, chills, bowel or bladder dysfunction  Physical Exam  BP (!) 151/104 (BP Location: Left Arm)   Pulse 99   Temp 98.4 F (36.9 C) (Oral)   Resp 16   SpO2 100%  Gen:   Awake, no distress   Resp:  Normal effort, able speak in full senses without difficulty. MSK:   Moves extremities without difficulty; no midline tenderness or deformity to cervical, thoracic, or lumbar spine.  She has tenderness to left thoracic back Other:  Patient has no slurred speech, facial asymmetry, grip strength equal,  Medical Decision Making  Medically screening exam initiated at 5:32 PM.  Appropriate orders placed.  Carla [name] was informed that the remainder of the evaluation will be completed by another provider, this initial triage assessment does not replace that evaluation, and the importance of remaining in the ED until their evaluation is complete.  The patient appears stable so that the remainder of the work up may be completed by another provider.      Loni Beckwith, PA-C 04/06/21 1742    Loni Beckwith, PA-C 04/06/21 1743    Loni Beckwith, PA-C 04/06/21 1745    Hayden Rasmussen, MD 04/07/21 1239

## 2021-04-06 NOTE — Discharge Instructions (Addendum)
Call your primary care doctor or specialist as discussed in the next 2-3 days.   Return immediately back to the ER if:  Your symptoms worsen within the next 12-24 hours. You develop new symptoms such as new fevers, persistent vomiting, new pain, shortness of breath, or new weakness or numbness, or if you have any other concerns.  

## 2021-04-06 NOTE — Telephone Encounter (Signed)
See note

## 2021-04-16 ENCOUNTER — Ambulatory Visit (INDEPENDENT_AMBULATORY_CARE_PROVIDER_SITE_OTHER): Payer: Self-pay | Admitting: Adult Health

## 2021-04-16 ENCOUNTER — Encounter: Payer: Self-pay | Admitting: Adult Health

## 2021-04-16 VITALS — BP 128/87 | HR 114 | Ht 64.0 in | Wt 148.0 lb

## 2021-04-16 DIAGNOSIS — R42 Dizziness and giddiness: Secondary | ICD-10-CM

## 2021-04-16 DIAGNOSIS — Z8673 Personal history of transient ischemic attack (TIA), and cerebral infarction without residual deficits: Secondary | ICD-10-CM

## 2021-04-16 DIAGNOSIS — G4489 Other headache syndrome: Secondary | ICD-10-CM

## 2021-04-16 DIAGNOSIS — G43109 Migraine with aura, not intractable, without status migrainosus: Secondary | ICD-10-CM

## 2021-04-16 MED ORDER — MECLIZINE HCL 25 MG PO TABS: 25.0000 mg | ORAL_TABLET | Freq: Two times a day (BID) | ORAL | 0 refills | Status: DC | PRN

## 2021-04-16 MED ORDER — AMITRIPTYLINE HCL 25 MG PO TABS: 25.0000 mg | ORAL_TABLET | Freq: Every day | ORAL | 3 refills | Status: DC

## 2021-04-16 NOTE — Progress Notes (Signed)
I agree with the above plan 

## 2021-04-16 NOTE — Patient Instructions (Signed)
Continue amitrityline for migraine prevention  Continue meclizine for vertigo as needed  Continue aspirin 81 mg daily and fenofibrate and start atorvastatin 20mg  daily  for secondary stroke prevention Any difficulty tolerating please ensure you speak further speak with your primary care doctor if any difficulty tolerating  You do not need to restart omega 3 at this time  Continue to follow up with PCP regarding cholesterol and blood pressure management  Maintain strict control of hypertension with blood pressure goal below 130/90 and cholesterol with LDL cholesterol (bad cholesterol) goal below 70 mg/dL.       Followup in the future with me in 6 months or call earlier if needed       Thank you for coming to see [place] at [facility]. I hope we have been able to provide you high quality care today.  You may receive a patient satisfaction survey over the next few weeks. We would appreciate your feedback and comments so that we may continue to improve ourselves and the health of our patients.

## 2021-04-16 NOTE — Progress Notes (Signed)
[name] [address] [place]. [place] [address] [phone]       OFFICE FOLLOW UP NOTE  Carla. Carla [name] Date of Birth:  1971/03/19 Medical Record Number:  [id number]   [facility] provider: Dr. Pearlean Brownie Reason for Referral: Recurrence of migraines   Chief complaint Chief Complaint  Patient presents with  . Follow-up    RM 14 (has cone interpreter ) Pt is well, migraines have significantly improved to about 1-2 a month and she sleeps better.     HPI:  Today, 04/16/2021, Carla [name] returns for 75-month follow-up accompanied by [facility] interpreter.  Migraines have been stable - reports 1-2 per month typically lasting 30-40 minutes. Will use tylenol on occasion with benefit. She has remained on amitrityline but reports not being on effexor (denies ever being on it)  Vertigo stable occurring 1-2x weekly - will use meclizine only as needed with benefit  Stable from stroke standpoint without new stroke/TIA symptoms.  Reports intermittent use of aspirin 81mg  daily and daily use of fenofibrate without associated side effects.  PCP recently recommended started atorvastatin but she denies receiving prescription due to lipid panel showing LDL 122.  She has trialed atorvastatin in the past but unsure if reported side effects at that time from statin or multiple other medications being taken at this time.  She is willing to trial again.  Blood pressure today 128/87.    No further concerns at this time     History provided for reference purposes only Update 10/16/2020 JM: Carla [name] returns for 7-month follow-up accompanied by interpreter.  At prior visit, complaints of continued migraines which have since greatly improved with use of amitriptyline 25 mg nightly and venlafaxine 37.5 mg daily tolerating well without side effects.  Reports 1 migraine over the past month.  She does report occasional tension type headaches but has been told likely due to new rx lenses and have  been slowly improving.  Prior complaints of intermittent vertigo but overall stable and will take meclizine only as needed.  She does report evaluation by ENT Dr. Suszanne Conners for vertigo approximately 3 to 4 months ago but has not received results.  Stable from TIA/stroke standpoint without new or reoccurring stroke/TIA symptoms.  Remains on aspirin 81 mg daily and omega-3 for secondary stroke prevention of side effects.  Blood pressure today 125/76.  No further concerns at this time.  Update 06/27/2020 JM: Carla [name] returns for follow-up accompanied by interpreter regarding reoccurrence of chronic migraines with underlying history of migraines, cryptogenic stroke 2016, TIA, vertigo, depression and anxiety. Evaluated at [facility] ED on 06/13/2020 due to pain and numbness in her left arm accompanied by headache. All work-up unremarkable including D-dimer, EKG, chest x-ray and CT head and treated for presumed cervical radiculopathy with naproxen and advised to follow-up with PCP.  Evaluated by PCP 06/19/2020 with complete resolution of symptoms. Of note, presented to ED on 03/16/2020 with chief complaint of confusion and left arm numbness with headache and diagnosed with complex migraine. Migraine occurrence has improved since prior visit with ongoing use of amitriptyline 10 mg nightly.  Severe migraine occurrence 4 -5 times per months but does experience tension type headaches almost every other day lasting for approximately 1 hour and are not debilitating.  Reports tension type headaches occur with increased stress or anxiety.  She has not had any reoccurrence of left arm numbness or any other neurological symptoms Intermittent vertigo and PCP recently referred to ENT She remains uninsured but is currently  in the process of applying for Medicaid   Update 03/21/2020 JM, Carla [name] is being seen today accompanied by her son who assist with interpretation due to recent emergency room visit with left arm numbness and  confusion accompanied by headache on 03/16/2020.  MRI brain negative for acute intracranial abnormality.  She was diagnosed with complex migraines as symptoms objective and no focal deficit identified.  She was advised to follow-up outpatient for further evaluation.  Prior medical history of cryptogenic stroke 2016, TIA, vertigo and migraines.  Previously treated in this office for migraine management on Topamax 25 mg twice daily but self discontinued due to difficulty tolerating.  She has not had any difficulties with migraines in over the past year and has not required any treatment.  Typical migraines recurred approximately 2 weeks ago located right temporal and periorbital area with pulsating quality with blurred vision, photophobia, phonophobia and nausea.  Symptoms consistent with previously known migraines.  She may also experience pain in occipital region.  She has been experiencing migraines every other day which last up until 2 hours after laying down and resting.  She has not had any recurrence of confusion or numbness/tingling.  She also reports symptoms of fatigue, dizziness, and shortness of breath which appears to be anxiety related per son.  She also recently self discontinued lovastatin due to subjective increased anxiety after taking.  Previously discussed underlying anxiety at prior visits which likely contributing and/or worsening symptoms but she has previously declined further management or counseling.  She has continued on aspirin 81 mg daily without bleeding or bruising.  Blood pressure today 128/83.  No further concerns at this time.   Initial visit 09/29/2018 Dr. Pearlean Brownie:  Carla [name] is a pleasant 36 year Hispanic lady originally from [place] who was seen today for evaluation for headaches.  She is accompanied by her friend as well as Spanish language interpreter who provides translation for this visit.  History is obtained from them, review of electronic medical records and have  personally reviewed imaging films.  Patient has been complaining of increasing headaches for the last 2 months.  She describes this headache as being right temporal and frontal mostly throbbing in nature though fluctuating in severity from 6/10 to 10/10.  Headache is accompanied by nausea vomiting light and sound sensitivity.  She has to stop her activities and lie down and sleeping as well as taking Tylenol seems to help.  Headaches are accompanied by a single vision spots and scintillations and blurred vision.  She denies any speech difficulties, vertigo, diplopia, gait or balance difficulties with headaches.  Headaches usually last about 30 to 40 minutes.  Headaches of late have been occurring about 2-3 times per week.  She does give a remote history of infrequent headaches which sound like migraine headaches and was similar but were mostly perimenstrual and not bad.  The patient also states in September 2019 she had a episode of speech difficulties as well as dizziness.  She was seen by primary care physician who states that the patient had a headache as well as feeling of dizziness and some speech difficulties.  This happened following a stressful event.  An outpatient MRI was ordered which was done on 08/05/2018 which I personally reviewed shows a few nonspecific white matter hyperintensities but no acute abnormality or definite stroke.  Patient states her symptoms improved within a few days but the headaches have persisted since then.  She actually has a history of cryptogenic stroke in November  2016 for which I evaluated her.MRI scan of the brain on 11/16/2016showed traced diffusion signal in the posterior left MCA territory consistent with MCA branch infarct. MRA of the brain showed slight diminished peripheral branches in the left MCA but no large vessel stenosis or occlusion. Carotid ultrasound showed no significant extracranial stenosis. Transthoracic echo showed normal ejection fraction. Transesophageal  echocardiogram showed no cardiac source of embolism or PFO. Antiphospholipid antibodies and lupus anticoagulant were both negative. Lipid profile showed elevated total cholesterol 207 and LDL 139 mg percent. Hemoglobin A1c was 5.6. Patient was started on aspirin as well as protocol. She states her speech difficulty and right-sided weakness completely improved.  She was placed on aspirin and pravastatin which she actually did well on but for unclear reason she discontinued both medications.  She is currently on Lipitor 40 which was started a few months ago by primary physician but she is complaining of dizziness on this medication and wants to change it or stop it.  She states she had slight balance difficulties from her previous stroke but had recovered well from that.  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  She states her blood pressure is under good control.  Last lipid profile checked in August 2019 showed LDL of 120 following which Lipitor was added.  I had recommended doing a transcranial Doppler bubble study at last visit with me in 2017 but for some unclear reason this was never done.  Update 12/30/2018: Carla [name] is being seen today for follow-up visit for migraines and is accompanied by interpreter as patient is primarily Spanish-speaking.  Upon initial visit with Dr. Pearlean Brownie on 09/29/2018 (visit info below), headaches were not frequent enough to justify initiating migraine prophylaxis.  She did call back on 12/02/2018 reporting daily migraines and therefore initiated Topamax 25 mg twice daily.  She took this medication for 4 days but discontinued as medication was making her too fatigued.  She consider taking 1 tab at night only but wanted to speak with [place] in regards to this first.  She was evaluated in the ED on 12/19/2018 and returned on 12/23/2018 due to vaginal bleeding.  Apparently, all medications discontinued after 12/23/2018 visit and was started on Provera  for 10-day course with recommending follow-up with OB/GYN for further evaluation.  She reports today that her headaches have improved and not experience a headache since ED admission on 12/23/2018.  She does have an appointment scheduled at [facility] on 01/12/2019 for further evaluation.  She denies any continued vaginal bleeding at this time.  Blood pressure today satisfactory 130/68. It was recommended after prior visit to undergo transcranial Doppler with bubble study to assess for possible PFO.  This was obtained on 10/14/2018 and was negative for PFO.  Update 06/21/2019 : She returns for follow-up after last visit 5 months ago.  She is accompanied by a [place] language interpreter who was present throughout the visit and interprets for her.  Patient states her main complaint today is that her migraine headaches seem to have come back.  In the last 3 months she has had increased frequency of headaches which occur 3 to 4 days/week.  The headache starts off as a throbbing pain around her right temple as well as periorbital region becomes unbearable 10/10 in severity.  She has relied on.  Light and sound do worsen the headache.  She does not throw up or feel nauseous.  She does not have visual symptoms with  the headaches.  Headache may last for most of the day.  She is unable to identify specific triggers for headaches but does feel tightness in the back of her head and neck muscles.  She has not tried triptans or prescription migraine specific medications she has tried taking Tylenol and ibuprofen without relief.  She has been taking naproxen which seems to work better.  She has been taking them 3 to 4 days a week.  She was previously on Topamax but when she started having dysfunctional uterine bleeding all the medications were stopped including Topamax.  She is unable to tell me if she had any bad side effects on it.  Patient recently changed her primary care physician and a new physician assistant  discontinued protocol for unclear reason.  She remains on aspirin which is tolerating well without bruising or bleeding.  Her blood pressure is under good control and today it is 142/83.  She has had no recurrent stroke or TIA symptoms.  Update 09/02/2019: Carla [name] is being seen today for 26-month follow-up regarding migraine headaches accompanied by interpreter.  After prior visit, Topamax 25 mg daily was restarted due to worsening migraines as well as Relpax for emergent relief.  She did not tolerate Topamax as she reports this caused weight loss and short-term memory loss.  She was unable to afford Relpax.  She has recently been experiencing migraines 2 times weekly and typically lasts for 1 hour total but during this time, migraines are debilitating and continues to be located right temporal area.  She has not trialed any other type of migraine medication.  Recent diagnosis of COVID-19 in 06/2019 and feels as though she has not completely recovered mentally and physically.  After prior visit, lipid panel obtained and recommended restart pravastatin but due to side effects, PCP initiated Crestor.  She reports difficulty tolerating Crestor but plans on following up with PCP for further monitoring and management.  Blood pressure today 135/91.  Update 11/04/2019: Carla [name] is a 50 year old female who is being seen today by patient request due to concerns of balance difficulties concern for stroke.  She is accompanied today by a interpreter.  She did not seek medical treatment at onset.  Approximately 1.5 weeks ago, she states she started experiencing intermittent balance impairment with a swaying type sensation and room spinning.  Symptoms severe where she will have to hold onto something and close her eyes.  She states she experienced same type of symptoms with prior stroke.  She denies associated headache, neck pain, speech difficulty, visual changes, weakness or numbness/tingling.  She denies increased  stress or anxiety/depression.  She states standing straight up or laying flat or with head movement induces the symptoms.  She denies currently feeling symptoms at today's visit.  She does endorse recently stopping her metoprolol 3 days ago due to increased stress and irritability symptoms.  She does monitor blood pressure at home and SBP 130s.  She is unable to endorse diastolic pressures or heart rates.  Blood pressure today satisfactory at 132/96.  He does endorse continuation of aspirin 81 mg daily and recently started on pravastatin approximately 3 weeks ago by PCP.  Update 11/09/2019: Carla [name] is a 50 year old female who is being seen today by patient request due to recent ED admission on 11/06/2019 for ongoing concerns of dizziness previously evaluated in office 5 days ago.  She is accompanied by interpreter.  Per review of ED note, she reported worsening dizziness and headache 2 days prior to  ED admission concerning for stroke.  CT head and MRI brain no acute abnormalities.  She was advised to follow-up with PCP and neurology at discharge.  She has been doing well since discharge without any reoccurring vertigo or dizziness symptoms or headaches.  She has continued to take meclizine with benefit 25 mg tablets twice daily.  She continues on aspirin 81 mg daily and pravastatin for secondary stroke prevention without side effects. Blood pressure today 124/71.  No further concerns at this time.  Update 01/04/2020: Carla [name] is a 50 year old female who is being seen today for follow-up regarding prior concerns of dizziness accompanied by interpreter.  Denies residual/dizziness symptoms.  Continues twice daily meclizine with occasional fatigue.  Continues on aspirin 81 mg daily and pravastatin for secondary stroke prevention without side effects.  Blood pressure today 127/78.  Mild short-term memory loss but endorses ongoing improvement.  No concerns at this time.    ROS:   14 system review of systems  is positive for see HPI and all other systems negative  PMH:  Past Medical History:  Diagnosis Date  . COVID-19   . Depression   . Frequent headaches   . 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 Hyperlipidemia   . Hypertension   . Stroke (HCC)    09/2015  . Vertigo     Social History:  Social History   Socioeconomic History  . Marital status: Married    Spouse name: Not on file  . Number of children: Not on file  . Years of education: Not on file  . Highest education level: Not on file  Occupational History  . Occupation: Financial trader  Tobacco Use  . Smoking status: Never Smoker  . Smokeless tobacco: Never Used  Vaping Use  . Vaping Use: Never used  Substance and Sexual Activity  . Alcohol use: No  . Drug use: No  . Sexual activity: Not Currently    Birth control/protection: None  Other Topics Concern  . Not on file  Social History Narrative   From [place]   Lived in [place] for 20 years, and been in [place] for 6 years   Husband and son, 39   Works: clean homes   Social Determinants of Corporate investment banker Strain: Not on [employer] Insecurity: Not on file  Transportation Needs: Not on file  Physical Activity: Not on file  Stress: Not on file  Social Connections: Not on file  Intimate Partner Violence: Not on file    Medications:   Current Outpatient Medications on File Prior to Visit  Medication Sig Dispense Refill  . amitriptyline (ELAVIL) 25 MG tablet Take 1 tablet (25 mg total) by mouth at bedtime. 90 tablet 3  . ASPIRIN LOW DOSE 81 MG EC tablet Take 81 mg by mouth daily.    . fenofibrate 54 MG tablet TAKE 1 TABLET (54 MG TOTAL) BY MOUTH DAILY. WITH FOOD 30 tablet 0  . fish oil-omega-3 fatty acids 1000 MG capsule Take 2 capsules (2 g total) by mouth daily.    . meclizine (ANTIVERT) 25 MG tablet Take 1 tablet (25 mg total) by mouth 2 (two) times daily as needed for dizziness. 90 tablet 0  . metoprolol tartrate (LOPRESSOR) 25 MG tablet TAKE 0.5 TABLETS (12.5 MG  TOTAL) BY MOUTH 2 (TWO) TIMES DAILY. 90 tablet 1  . naproxen (NAPROSYN) 500 MG tablet Take 1 tablet (500 mg total) by mouth 2 (two) times daily. 30 tablet 0  . triamcinolone cream (  KENALOG) 0.1 % Apply to affected area 1-2 times daily 30 g 0   No current facility-administered medications on file prior to visit.    Allergies:   Allergies  Allergen Reactions  . Lipitor [Atorvastatin Calcium] Swelling    throat  . Pravastatin Anxiety  . Tramadol Nausea Only    Physical Exam  Today's Vitals   04/16/21 0810  BP: 128/87  Pulse: (!) 114  Weight: 148 lb (67.1 kg)  Height: 5\' 4"  (1.626 m)   Body mass index is 25.4 kg/m.  Physical exam General: well developed, well nourished, pleasant middle-aged Hispanic female, seated, in no apparent distress Head: head normocephalic and atraumatic.   Neck: supple with no carotid or supraclavicular bruits Cardiovascular: regular rate and rhythm, no murmurs Musculoskeletal: no deformity Skin:  no rash/petichiae Vascular:  Normal pulses all extremities  Neurologic Exam Mental Status: awake and fully alert. Primarily Spanish-speaking with limited English. Denies speech and language concerns.  Oriented to place and time. Recent and remote memory intact. Attention span, concentration and fund of knowledge appropriate. Mood and affect appropriate Cranial Nerves: Pupils equal, briskly reactive to light.  Extraocular movements full without nystagmus. Visual fields full to confrontation. Hearing intact. Facial sensation intact. Face, tongue, palate moves normally and symmetrically.   Motor: Normal bulk and tone. Normal strength in all tested extremity muscles. Sensory.: intact to touch , pinprick , position and vibratory sensation.  Coordination: Rapid alternating movements normal in all extremities. Finger-to-nose and heel-to-shin performed accurately bilaterally. Gait and Station: Arises from chair without difficulty. Stance is normal. Gait demonstrates  normal stride length and balance . Able to heel, toe and tandem walk without difficulty.  Romberg negative. Reflexes: 1+ and symmetric. Toes downgoing.      ASSESSMENT/PLAN:  Carla [name] is a 50 year old Hispanic lady with with PMH of cryptogenic stroke 2016, TIA, HDL, HTN, BPPV, anxiety and migraines.  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  Advised to contact PCP if difficulty tolerating statin and may need to consider use of PCSK9 inhibitor with history of statin intolerance -Continue to follow with PCP for HTN and HLD management and secondary stroke risk factor management with BP goal<130/90 and LDL goal<70    Follow-up in 6 months or call earlier if needed  CC:  [facility] provider: Dr. Tyler Deis, [place], [place]     Ihor Austin, [facility]  [facility] [address] [place], [place] [address]  Phone [phone] Fax [phone] Note: This document was prepared with digital dictation and possible smart phrase technology. Any transcriptional errors that result from this process are unintentional.

## 2021-04-19 ENCOUNTER — Telehealth: Payer: Self-pay

## 2021-04-19 ENCOUNTER — Other Ambulatory Visit: Payer: Self-pay

## 2021-04-19 MED ORDER — METOPROLOL TARTRATE 25 MG PO TABS: 12.5000 mg | ORAL_TABLET | Freq: Two times a day (BID) | ORAL | 1 refills | Status: DC

## 2021-04-19 NOTE — Telephone Encounter (Signed)
.   LAST APPOINTMENT DATE: 04/06/2021   NEXT APPOINTMENT DATE:@Visit  date not found  MEDICATION:metoprolol tartrate (LOPRESSOR) 25 MG tablet(Expired)   PHARMACY:[id number] - [place], [place] [facility] [address]

## 2021-04-19 NOTE — Telephone Encounter (Signed)
Medication sent to patient's pharmacy.

## 2021-04-24 ENCOUNTER — Other Ambulatory Visit: Payer: Self-pay | Admitting: Physician Assistant

## 2021-05-08 ENCOUNTER — Other Ambulatory Visit: Payer: Self-pay | Admitting: Family Medicine

## 2021-07-03 ENCOUNTER — Telehealth: Payer: Self-pay

## 2021-07-03 NOTE — Telephone Encounter (Signed)
Error

## 2021-07-06 ENCOUNTER — Other Ambulatory Visit: Payer: Self-pay

## 2021-07-06 ENCOUNTER — Encounter: Payer: Self-pay | Admitting: Physician Assistant

## 2021-07-06 ENCOUNTER — Ambulatory Visit (INDEPENDENT_AMBULATORY_CARE_PROVIDER_SITE_OTHER): Payer: Self-pay | Admitting: Physician Assistant

## 2021-07-06 VITALS — BP 144/90 | HR 95 | Temp 98.2°F | Ht 64.0 in | Wt 150.2 lb

## 2021-07-06 DIAGNOSIS — H814 Vertigo of central origin: Secondary | ICD-10-CM

## 2021-07-06 NOTE — Progress Notes (Signed)
Acute Office Visit  Subjective:    Patient ID: Carla [name], female    DOB: 1970-12-15, 50 y.o.   MRN: [id number]  Chief Complaint  Patient presents with   Ear Pain    Left ear,drainage x 2 weeks .dizziness feels nausea like she is going to vomit    HPI Patient is in today for left ear pain x 2 weeks. She is accompanied by [facility] interpreter. It first started with drainage coming out of the ear. Then she developed the pain. She is also having some dizziness and nausea. She took a pill for vertigo this morning and it did help some. She has been diagnosed with vertigo in this same ear previously. She was Rx'd meclizine by a neurologist previously, which did help.   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   Hyperlipidemia    Hypertension    Stroke (HCC)    09/2015   Vertigo     Past Surgical History:  Procedure Laterality Date   RIGHT ARM SURGERY     TEE WITHOUT CARDIOVERSION N/A 10/13/2015   Procedure: TRANSESOPHAGEAL ECHOCARDIOGRAM (TEE);  Surgeon: Lewayne Bunting, MD;  Location: [facility] ENDOSCOPY;  Service: Cardiovascular;  Laterality: N/A;    Family History  Problem Relation Age of Onset   Leukemia Mother    Colon cancer Brother    Asthma Neg Hx    Cancer Neg Hx    Diabetes Neg Hx    Hyperlipidemia Neg Hx    Heart failure Neg Hx    Hypertension Neg Hx    Migraines Neg Hx    Rashes / Skin problems Neg Hx    Seizures Neg Hx    Stroke Neg Hx    Thyroid disease Neg Hx     Social History   Socioeconomic History   Marital status: Married    Spouse name: Not on file   Number of children: Not on file   Years of education: Not on file   Highest education level: Not on file  Occupational History   Occupation: Education officer, [employer]  Tobacco Use   Smoking status: Never   Smokeless tobacco: Never  Vaping Use   Vaping Use: Never used  Substance and Sexual Activity   Alcohol use: No   Drug use:  No   Sexual activity: Not Currently    Birth control/protection: None  Other Topics Concern   Not on file  Social History Narrative   From [place]   Lived in [place] for 20 years, and been in [place] for 6 years   Husband and son, 9   Works: clean homes   Social Determinants of Corporate investment banker Strain: Not on [employer] Insecurity: Not on file  Transportation Needs: Not on file  Physical Activity: Not on file  Stress: Not on file  Social Connections: Not on file  Intimate Partner Violence: Not on file    Outpatient Medications Prior to Visit  Medication Sig Dispense Refill   amitriptyline (ELAVIL) 25 MG tablet Take 1 tablet (25 mg total) by mouth at bedtime. 90 tablet 3   ASPIRIN LOW DOSE 81 MG EC tablet Take 81 mg by mouth daily.     fenofibrate 54 MG tablet TAKE 1 TABLET BY MOUTH DAILY. WITH FOOD 90 tablet 1   meclizine (ANTIVERT) 25 MG tablet Take 1 tablet (25 mg total) by mouth 2 (two)  times daily as needed for dizziness. 90 tablet 0   metoprolol tartrate (LOPRESSOR) 25 MG tablet Take 0.5 tablets (12.5 mg total) by mouth 2 (two) times daily. 90 tablet 1   naproxen (NAPROSYN) 500 MG tablet Take 1 tablet (500 mg total) by mouth 2 (two) times daily. 30 tablet 0   triamcinolone cream (KENALOG) 0.1 % Apply to affected area 1-2 times daily 30 g 0   No facility-administered medications prior to visit.    Allergies  Allergen Reactions   Pravastatin Anxiety   Tramadol Nausea Only    Review of Systems REFER TO HPI FOR PERTINENT POSITIVES AND NEGATIVES     Objective:    Physical Exam Vitals and nursing note reviewed.  Constitutional:      Appearance: Normal appearance. She is normal weight. She is not toxic-appearing.  HENT:     Head: Normocephalic and atraumatic.     Right Ear: Tympanic membrane, ear canal and external ear normal.     Left Ear: Tympanic membrane, ear canal and external ear normal.     Nose: Nose normal.     Mouth/Throat:     Mouth: Mucous  membranes are moist.  Eyes:     Extraocular Movements: Extraocular movements intact.     Conjunctiva/sclera: Conjunctivae normal.     Pupils: Pupils are equal, round, and reactive to light.  Cardiovascular:     Rate and Rhythm: Normal rate and regular rhythm.     Pulses: Normal pulses.     Heart sounds: Normal heart sounds.  Pulmonary:     Effort: Pulmonary effort is normal.     Breath sounds: Normal breath sounds.  Musculoskeletal:        General: Normal range of motion.     Cervical back: Normal range of motion and neck supple.  Skin:    General: Skin is warm and dry.  Neurological:     General: No focal deficit present.     Mental Status: She is alert and oriented to person, place, and time.     Cranial Nerves: No cranial nerve deficit.  Psychiatric:        Mood and Affect: Mood normal.        Behavior: Behavior normal.        Thought Content: Thought content normal.        Judgment: Judgment normal.    BP (!) 144/90   Pulse 95   Temp 98.2 F (36.8 C)   Ht 5\' 4"  (1.626 m)   Wt 150 lb 3.2 oz (68.1 kg)   SpO2 99%   BMI 25.78 kg/m  Wt Readings from Last 3 Encounters:  07/06/21 150 lb 3.2 oz (68.1 kg)  04/16/21 148 lb (67.1 kg)  04/03/21 150 lb (68 kg)    Health Maintenance Due  Topic Date Due   PAP SMEAR-Modifier  04/09/2019   Zoster Vaccines- Shingrix (1 of 2) Never done   COVID-19 Vaccine (3 - Booster for Pfizer series) 02/05/2021   INFLUENZA VACCINE  06/25/2021    There are no preventive care reminders to display for this patient.   Lab Results  Component Value Date   TSH 1.45 07/05/2019   Lab Results  Component Value Date   WBC 8.8 04/06/2021   HGB 12.8 04/06/2021   HCT 39.0 04/06/2021   MCV 90.3 04/06/2021   PLT 367 04/06/2021   Lab Results  Component Value Date   NA 142 04/06/2021   K 4.1 04/06/2021   CO2 27 04/06/2021  GLUCOSE 96 04/06/2021   BUN 12 04/06/2021   CREATININE 0.95 04/06/2021   BILITOT 0.7 04/06/2021   ALKPHOS 75  04/06/2021   AST 22 04/06/2021   ALT 22 04/06/2021   PROT 7.1 04/06/2021   ALBUMIN 3.9 04/06/2021   CALCIUM 9.4 04/06/2021   ANIONGAP 9 04/06/2021   GFR 82.19 04/03/2021   Lab Results  Component Value Date   CHOL 208 (H) 04/03/2021   Lab Results  Component Value Date   HDL 55.50 04/03/2021   Lab Results  Component Value Date   LDLCALC 122 (H) 04/03/2021   Lab Results  Component Value Date   TRIG 150.0 (H) 04/03/2021   Lab Results  Component Value Date   CHOLHDL 4 04/03/2021   Lab Results  Component Value Date   HGBA1C 5.7 04/03/2021       Assessment & Plan:   Problem List Items Addressed This Visit   None Visit Diagnoses     Central vestibular vertigo    -  Primary   Relevant Orders   Ambulatory referral to Physical Therapy       1. Central vestibular vertigo This has previously been worked up by neurology and ENT. I personally reviewed her prior records and imaging reports (numerous CT and MRI brain). Symptoms still consistent with vertigo. She does have meclizine at home. I encouraged her to take this TID for the next 7-14 days. She needs to increase fluids and have caution with driving. Referral placed to vestibular rehab. Handout provided on [facility]. She understands and agrees to go to ED in case of any severe headaches, dizziness, pain, weakness, or other changes.  Total time spent with patient today including H & P, record review, discussion of plan, and documentation was 39 minutes.  Bray Vickerman M Dea Bitting, PA-C

## 2021-07-06 NOTE — Patient Instructions (Addendum)
-  You passed your hearing test today!  -Increase meclizine back to three times daily for the next 7-10 days at least -Push fluids -Referral to vestibular rehabilitation  -To the Emergency Dept in case of any severe headaches, dizziness, pain, or other acute changes.

## 2021-07-11 ENCOUNTER — Encounter (HOSPITAL_COMMUNITY): Payer: Self-pay

## 2021-07-11 ENCOUNTER — Emergency Department (HOSPITAL_COMMUNITY)
Admission: EM | Admit: 2021-07-11 | Discharge: 2021-07-12 | Disposition: A | Discharge status: Home / Self Care | Payer: Medicaid Other | Attending: Emergency Medicine | Admitting: Emergency Medicine

## 2021-07-11 ENCOUNTER — Emergency Department (HOSPITAL_COMMUNITY): Payer: Medicaid Other

## 2021-07-11 ENCOUNTER — Other Ambulatory Visit: Payer: Self-pay

## 2021-07-11 DIAGNOSIS — Z7982 Long term (current) use of aspirin: Secondary | ICD-10-CM | POA: Insufficient documentation

## 2021-07-11 DIAGNOSIS — R42 Dizziness and giddiness: Secondary | ICD-10-CM | POA: Insufficient documentation

## 2021-07-11 DIAGNOSIS — I10 Essential (primary) hypertension: Secondary | ICD-10-CM | POA: Insufficient documentation

## 2021-07-11 DIAGNOSIS — R519 Headache, unspecified: Secondary | ICD-10-CM | POA: Insufficient documentation

## 2021-07-11 DIAGNOSIS — Z8616 Personal history of COVID-19: Secondary | ICD-10-CM | POA: Insufficient documentation

## 2021-07-11 DIAGNOSIS — R110 Nausea: Secondary | ICD-10-CM | POA: Insufficient documentation

## 2021-07-11 DIAGNOSIS — Z79899 Other long term (current) drug therapy: Secondary | ICD-10-CM | POA: Insufficient documentation

## 2021-07-11 MED ORDER — KETOROLAC TROMETHAMINE 30 MG/ML IJ SOLN
30.0000 mg | Freq: Once | INTRAMUSCULAR | Status: AC
  Administered 2021-07-12: 30 mg via INTRAMUSCULAR
  Filled 2021-07-11: qty 1

## 2021-07-11 MED ORDER — ONDANSETRON 4 MG PO TBDP
8.0000 mg | ORAL_TABLET | Freq: Once | ORAL | Status: AC
  Administered 2021-07-12: 8 mg via ORAL
  Filled 2021-07-11: qty 2

## 2021-07-11 NOTE — ED Triage Notes (Signed)
Pt reports 3 weeks intermittent headache with nausea and itching eyes. Pt also reports she can hear liquid moving in her left ear and has pain/swelling to her left cheek.

## 2021-07-11 NOTE — ED Provider Notes (Signed)
Emergency Medicine Provider Triage Evaluation Note  Carla [name] , a 50 y.o. female  was evaluated in triage.  Pt complains of intermittent headache for the past 3 weeks.  Started out with pain on the left ear with the liquid moving around in her ear.  No relief with meclizine.  Reports associated nausea.  Took Tylenol this morning but pain got worse.  Review of Systems  Positive: Headache, blurry vision Negative: Numbness in arms or legs, injury  Physical Exam  BP (!) 150/107   Pulse 89   Temp 98.2 F (36.8 C) (Oral)   Resp 16   SpO2 100%  Gen:   Awake, no distress   Resp:  Normal effort  MSK:   Moves extremities without difficulty  Other:  No facial asymmetry.  Equal grip strength bilaterally  Medical Decision Making  Medically screening exam initiated at 3:28 PM.  Appropriate orders placed.  Carla [name] was informed that the remainder of the evaluation will be completed by another provider, this initial triage assessment does not replace that evaluation, and the importance of remaining in the ED until their evaluation is complete.  CT ordered due to new headache and age   Carla [name], Carla [name] 07/11/21 [place], MD 07/14/21 [phone]

## 2021-07-11 NOTE — ED Provider Notes (Signed)
[facility] EMERGENCY DEPARTMENT Provider Note   CSN: [id number] Arrival date & time: 07/11/21  1448     History Chief Complaint  Patient presents with   Headache    Carla [name] is a 50 y.o. female.   Headache  Patient presents to the ED for evaluation of headache and dizziness ongoing for the last several weeks.  Patient states she has been having intermittent headaches.  She also is having some vertigo dizziness symptoms.  She also has a fluid sensation within her left ear.  She has had some nausea.  She tried taking Tylenol without relief.  While she was in the rating room she took one of her migraine medications and her symptoms have improved.  She denies any fevers or chills.  No numbness or weakness.  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   Premature ventricular contractions 03/25/2016   Hyperlipidemia 03/25/2016   Benign paroxysmal positional vertigo 03/11/2016   Neck pain on right side 02/19/2016   Cryptogenic stroke ([place]) 12/28/2015   Multiple hemangiomas    Cerebral infarction due to unspecified mechanism    Cavernous hemangioma of liver 10/11/2015   Migraines 10/11/2015    Past Surgical History:  Procedure Laterality Date   RIGHT ARM SURGERY     TEE WITHOUT CARDIOVERSION N/A 10/13/2015   Procedure: TRANSESOPHAGEAL ECHOCARDIOGRAM (TEE);  Surgeon: Lelon Perla, MD;  [facility] ENDOSCOPY;  Service: Cardiovascular;  Laterality: N/A;     OB History     Gravida  2   Para  2   Term  1   Preterm  1   AB  0   Living  2      SAB  0   IAB  0   Ectopic  0   Multiple  0   Live Births  2           Family History  Problem  Relation Age of Onset   Leukemia Mother    Colon cancer Brother    Asthma Neg Hx    Cancer Neg Hx    Diabetes Neg Hx    Hyperlipidemia Neg Hx    Heart failure Neg Hx    Hypertension Neg Hx    Migraines Neg Hx    Rashes / Skin problems Neg Hx    Seizures Neg Hx    Stroke Neg Hx    Thyroid disease Neg Hx     Social History   Tobacco Use   Smoking status: Never   Smokeless tobacco: Never  Vaping Use   Vaping Use: Never used  Substance Use Topics   Alcohol use: No   Drug use: No    Home Medications Prior to Admission medications   Medication Sig Start Date End Date Taking? Authorizing Provider  amitriptyline (ELAVIL) 25 MG tablet Take 1 tablet (25 mg total) by mouth at bedtime. 04/16/21   Frann Rider, NP  ASPIRIN LOW DOSE 81 MG EC tablet Take 81 mg by mouth daily. 04/08/20   [provider]  fenofibrate 54 MG tablet TAKE 1 TABLET BY MOUTH DAILY. WITH FOOD 05/08/21  Vivi Barrack, MD  meclizine (ANTIVERT) 25 MG tablet Take 1 tablet (25 mg total) by mouth 2 (two) times daily as needed for dizziness. 04/16/21   Frann Rider, NP  metoprolol tartrate (LOPRESSOR) 25 MG tablet Take 0.5 tablets (12.5 mg total) by mouth 2 (two) times daily. 04/19/21 07/18/21  [name], PA  naproxen (NAPROSYN) 500 MG tablet Take 1 tablet (500 mg total) by mouth 2 (two) times daily. 07/12/21   Dorie Rank, MD  triamcinolone cream (KENALOG) 0.1 % Apply to affected area 1-2 times daily 06/19/20   [name], PA    Allergies    Pravastatin and Tramadol  Review of Systems   Review of Systems  Neurological:  Positive for headaches.  All other systems reviewed and are negative.  Physical Exam Updated Vital Signs BP (!) 163/93 (BP Location: Left Arm)   Pulse 65   Temp 98 F (36.7 C)   Resp 18   SpO2 100%   Physical Exam Vitals and nursing note reviewed.  Constitutional:      General: She is not in acute distress.    Appearance: She is well-developed.  HENT:     Head:  Normocephalic and atraumatic. No right periorbital erythema or left periorbital erythema.     Jaw: There is normal jaw occlusion.     Comments: Facial edema noted    Right Ear: Tympanic membrane and external ear normal.     Left Ear: Tympanic membrane and external ear normal.  Eyes:     General: No scleral icterus.       Right eye: No discharge.        Left eye: No discharge.     Conjunctiva/sclera: Conjunctivae normal.  Neck:     Trachea: No tracheal deviation.  Cardiovascular:     Rate and Rhythm: Normal rate and regular rhythm.  Pulmonary:     Effort: Pulmonary effort is normal. No respiratory distress.     Breath sounds: Normal breath sounds. No stridor. No wheezing or rales.  Abdominal:     General: Bowel sounds are normal. There is no distension.     Palpations: Abdomen is soft.     Tenderness: There is no abdominal tenderness. There is no guarding or rebound.  Musculoskeletal:        General: No tenderness or deformity.     Cervical back: Neck supple.  Skin:    General: Skin is warm and dry.     Findings: No rash.  Neurological:     General: No focal deficit present.     Mental Status: She is alert.     Cranial Nerves: No cranial nerve deficit (no facial droop, extraocular movements intact, no slurred speech).     Sensory: No sensory deficit.     Motor: No abnormal muscle tone or seizure activity.     Coordination: Coordination normal.  Psychiatric:        Mood and Affect: Mood normal.    ED Results / Procedures / Treatments   Labs (all labs ordered are listed, but only abnormal results are displayed) Labs Reviewed - No data to display  EKG None  Radiology CT HEAD WO CONTRAST (5MM)  Result Date: 07/11/2021 CLINICAL DATA:  Headache nausea EXAM: CT HEAD WITHOUT CONTRAST TECHNIQUE: Contiguous axial images were obtained from the base of the skull through the vertex without intravenous contrast. COMPARISON:  CT brain 04/06/2021 FINDINGS: Brain: No evidence of acute  infarction, hemorrhage, hydrocephalus, extra-axial collection or mass lesion/mass effect. Vascular: No  hyperdense vessel or unexpected calcification. Skull: Normal. Negative for fracture or focal lesion. Sinuses/Orbits: No acute finding. Other: None IMPRESSION: Negative non contrasted CT appearance of the brain Electronically Signed   By: Donavan Foil M.D.   On: 07/11/2021 16:12    Procedures Procedures   Medications Ordered in ED Medications  ketorolac (TORADOL) 30 MG/ML injection 30 mg (30 mg Intramuscular Given 07/12/21 0024)  ondansetron (ZOFRAN-ODT) disintegrating tablet 8 mg (8 mg Oral Given 07/12/21 0022)    ED Course  I have reviewed the triage vital signs and the nursing notes.  Pertinent labs & imaging results that were available during my care of the patient were reviewed by me and considered in my medical decision making (see chart for details).    MDM Rules/Calculators/A&P                           Patient with history of migraine headaches as well as vertigo.  Presented with complaints of worsening headache and persistent dizziness.  Head CT ordered by triage provider.  Head CT did not show any acute findings.  Patient's headache has improved.  No signs of meningitis.  No focal neurologic deficits to suggest stroke.  Patient does have history of migraines.  Suspect this was a migraine headache.  She is having some persistent vertigo symptoms.  Appears stable to follow-up with her neurologist Final Clinical Impression(s) / ED Diagnoses Final diagnoses:  Headache disorder  Vertigo    Rx / DC Orders ED Discharge Orders          Ordered    naproxen (NAPROSYN) 500 MG tablet  2 times daily        07/12/21 0034             Dorie Rank, MD 07/12/21 [phone]

## 2021-07-12 MED ORDER — NAPROXEN 500 MG PO TABS: 500.0000 mg | ORAL_TABLET | Freq: Two times a day (BID) | ORAL | 0 refills | Status: DC

## 2021-07-12 NOTE — Discharge Instructions (Addendum)
Follow up with your neurologist for further evaluation.  Return as needed for worsening symptoms

## 2021-07-12 NOTE — ED Notes (Signed)
DC instructions reviewed with pt.  PT verbalized instructions.  PT DC.

## 2021-08-19 ENCOUNTER — Other Ambulatory Visit: Payer: Self-pay

## 2021-08-19 ENCOUNTER — Emergency Department (HOSPITAL_COMMUNITY)
Admission: EM | Admit: 2021-08-19 | Discharge: 2021-08-20 | Disposition: A | Discharge status: Home / Self Care | Payer: Self-pay | Attending: Emergency Medicine | Admitting: Emergency Medicine

## 2021-08-19 ENCOUNTER — Encounter (HOSPITAL_COMMUNITY): Payer: Self-pay | Admitting: *Deleted

## 2021-08-19 DIAGNOSIS — Z7982 Long term (current) use of aspirin: Secondary | ICD-10-CM | POA: Insufficient documentation

## 2021-08-19 DIAGNOSIS — D1803 Hemangioma of intra-abdominal structures: Secondary | ICD-10-CM | POA: Insufficient documentation

## 2021-08-19 DIAGNOSIS — R110 Nausea: Secondary | ICD-10-CM | POA: Insufficient documentation

## 2021-08-19 DIAGNOSIS — R1031 Right lower quadrant pain: Secondary | ICD-10-CM

## 2021-08-19 DIAGNOSIS — I10 Essential (primary) hypertension: Secondary | ICD-10-CM | POA: Insufficient documentation

## 2021-08-19 DIAGNOSIS — Z79899 Other long term (current) drug therapy: Secondary | ICD-10-CM | POA: Insufficient documentation

## 2021-08-19 DIAGNOSIS — Z8616 Personal history of COVID-19: Secondary | ICD-10-CM | POA: Insufficient documentation

## 2021-08-19 LAB — COMPREHENSIVE METABOLIC PANEL
ALT: 24 U/L (ref 0–44)
AST: 23 U/L (ref 15–41)
Albumin: 3.9 g/dL (ref 3.5–5.0)
Alkaline Phosphatase: 67 U/L (ref 38–126)
Anion gap: 9 (ref 5–15)
BUN: 11 mg/dL (ref 6–20)
CO2: 25 mmol/L (ref 22–32)
Calcium: 9.6 mg/dL (ref 8.9–10.3)
Chloride: 106 mmol/L (ref 98–111)
Creatinine, Ser: 0.84 mg/dL (ref 0.44–1.00)
GFR, Estimated: >60 mL/min (ref >60)
Glucose, Bld: 93 mg/dL (ref 70–99)
Potassium: 4.1 mmol/L (ref 3.5–5.1)
Sodium: 140 mmol/L (ref 135–145)
Total Bilirubin: 0.6 mg/dL (ref 0.3–1.2)
Total Protein: 7.1 g/dL (ref 6.5–8.1)

## 2021-08-19 LAB — CBC WITH DIFFERENTIAL/PLATELET
Abs Immature Granulocytes: 0.01 10*3/uL (ref 0.00–0.07)
Basophils Absolute: 0.1 10*3/uL (ref 0.0–0.1)
Basophils Relative: 1 %
Eosinophils Absolute: 0.2 10*3/uL (ref 0.0–0.5)
Eosinophils Relative: 2 %
HCT: 41.7 % (ref 36.0–46.0)
Hemoglobin: 13.8 g/dL (ref 12.0–15.0)
Immature Granulocytes: 0 %
Lymphocytes Relative: 35 %
Lymphs Abs: 2.7 10*3/uL (ref 0.7–4.0)
MCH: 29.7 pg (ref 26.0–34.0)
MCHC: 33.1 g/dL (ref 30.0–36.0)
MCV: 89.7 fL (ref 80.0–100.0)
Monocytes Absolute: 0.6 10*3/uL (ref 0.1–1.0)
Monocytes Relative: 8 %
Neutro Abs: 4.1 10*3/uL (ref 1.7–7.7)
Neutrophils Relative %: 54 %
Platelets: 347 10*3/uL (ref 150–400)
RBC: 4.65 MIL/uL (ref 3.87–5.11)
RDW: 12.6 % (ref 11.5–15.5)
WBC: 7.6 10*3/uL (ref 4.0–10.5)
nRBC: 0 % (ref 0.0–0.2)

## 2021-08-19 LAB — LIPASE, BLOOD: Lipase: 39 U/L (ref 11–51)

## 2021-08-19 NOTE — ED Notes (Signed)
To mau  report called [facility] called also  they will see

## 2021-08-19 NOTE — ED Triage Notes (Addendum)
a 

## 2021-08-19 NOTE — ED Triage Notes (Signed)
The pt  is c/o rt sided lateral abd pain for 3-4 days no n or vomiting

## 2021-08-19 NOTE — ED Provider Notes (Signed)
Emergency Medicine Provider Triage Evaluation Note  Carla [name] , a 50 y.o. female  was evaluated in triage.  Pt complains of abdominal pain in the right abdomen x 3-4 days. Pain is waxing/waning, getting worse, radiates to her side at times. Associated nausea, minimal dysuria, and did have 1 episode of blood on toilet paper with BM 3 days ago, no recurrence.   Review of Systems  Positive: Abdominal pain, nausea, 1 episode of blood on toilet paper with BM., dysuria, Negative: Vomiting, diarrhea, melena, vaginal bleeding/discharge  Physical Exam  BP 135/89   Pulse 79   Temp 98.3 F (36.8 C) (Oral)   Resp 16   Ht 5' (1.524 m)   Wt 63.5 kg   SpO2 100%   BMI 27.34 kg/m  Gen:   Awake, no distress   Resp:  Normal effort  MSK:   Moves extremities without difficulty  Other:  RLQ abdominal tenderness w/o peritoneal signs.   Medical Decision Making  Medically screening exam initiated at 10:42 PM.  Appropriate orders placed.  Sanae Willetts was informed that the remainder of the evaluation will be completed by another provider, this initial triage assessment does not replace that evaluation, and the importance of remaining in the ED until their evaluation is complete.  Abdominal pain  Interpretor utilized throughout triage   Leafy Kindle 08/19/21 2249    Davonna Belling, MD 08/20/21 [phone]

## 2021-08-20 ENCOUNTER — Emergency Department (HOSPITAL_COMMUNITY): Payer: Self-pay

## 2021-08-20 LAB — URINALYSIS, ROUTINE W REFLEX MICROSCOPIC
Bilirubin Urine: NEGATIVE
Glucose, UA: NEGATIVE mg/dL
Ketones, ur: NEGATIVE mg/dL
Nitrite: NEGATIVE
Protein, ur: NEGATIVE mg/dL
Specific Gravity, Urine: >1.046 — ABNORMAL HIGH (ref 1.005–1.030)
pH: 7 (ref 5.0–8.0)

## 2021-08-20 LAB — I-STAT BETA HCG BLOOD, ED (MC, WL, AP ONLY): I-stat hCG, quantitative: <5 m[IU]/mL (ref <5)

## 2021-08-20 LAB — TSH: TSH: 3.354 u[IU]/mL (ref 0.350–4.500)

## 2021-08-20 MED ORDER — KETOROLAC TROMETHAMINE 15 MG/ML IJ SOLN
15.0000 mg | Freq: Once | INTRAMUSCULAR | Status: AC
  Administered 2021-08-20: 15 mg via INTRAVENOUS
  Filled 2021-08-20: qty 1

## 2021-08-20 MED ORDER — ONDANSETRON HCL 4 MG/2ML IJ SOLN
4.0000 mg | Freq: Once | INTRAMUSCULAR | Status: AC
  Administered 2021-08-20: 4 mg via INTRAVENOUS
  Filled 2021-08-20: qty 2

## 2021-08-20 MED ORDER — IOHEXOL 350 MG/ML SOLN
70.0000 mL | Freq: Once | INTRAVENOUS | Status: AC | PRN
  Administered 2021-08-20: 70 mL via INTRAVENOUS

## 2021-08-20 NOTE — ED Provider Notes (Signed)
[facility] EMERGENCY DEPARTMENT Provider Note   CSN: [id number] Arrival date & time: 08/19/21  2051     History Chief Complaint  Patient presents with   Abdominal Pain    Carla [name] is a 49 y.o. female who presents the emergency department for evaluation of abdominal pain.  Patient states she has had right lower quadrant pain that is approximately 5 out of 10 but gradually worsening over the last 3 to 4 days.  Endorses nausea but no vomiting.  Endorses a single episode of hematochezia 3 days ago but has since had a well formed stool with no blood.  Denies fever, chills, headache, cough, chest pain, shortness of breath or any other systemic symptoms.  Denies surgical history.  Denies vaginal bleeding or discharge.   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   Premature ventricular contractions 03/25/2016   Hyperlipidemia 03/25/2016   Benign paroxysmal positional vertigo 03/11/2016   Neck pain on right side 02/19/2016   Cryptogenic stroke ([place]) 12/28/2015   Multiple hemangiomas    Cerebral infarction due to unspecified mechanism    Cavernous hemangioma of liver 10/11/2015   Migraines 10/11/2015    Past Surgical History:  Procedure Laterality Date   RIGHT ARM SURGERY     TEE WITHOUT CARDIOVERSION N/A 10/13/2015   Procedure: TRANSESOPHAGEAL ECHOCARDIOGRAM (TEE);  Surgeon: Lelon Perla, MD;  Location: [facility] ENDOSCOPY;  Service: Cardiovascular;  Laterality: N/A;      OB History     Gravida  3   Para  2   Term  1   Preterm  1   AB  0   Living  2      SAB  0   IAB  0   Ectopic  0   Multiple  0   Live Births  2           Family History  Problem Relation Age of Onset   Leukemia Mother    Colon cancer Brother    Asthma Neg Hx    Cancer Neg Hx    Diabetes Neg Hx    Hyperlipidemia Neg Hx    Heart failure Neg Hx    Hypertension Neg Hx    Migraines Neg Hx    Rashes / Skin problems Neg Hx    Seizures Neg Hx    Stroke Neg Hx    Thyroid disease Neg Hx     Social History   Tobacco Use   Smoking status: Never   Smokeless tobacco: Never  Vaping Use   Vaping Use: Never used  Substance Use Topics   Alcohol use: No   Drug use: No    Home Medications Prior to Admission medications   Medication Sig Start Date End Date Taking? Authorizing Provider  amitriptyline (ELAVIL) 25 MG tablet Take 1 tablet (25 mg total) by mouth at bedtime. 04/16/21   Frann Rider, NP  ASPIRIN LOW DOSE 81 MG EC tablet Take 81 mg by mouth daily. 04/08/20   [provider]  fenofibrate 54 MG tablet TAKE 1 TABLET BY MOUTH DAILY. WITH FOOD 05/08/21   Vivi Barrack, MD  meclizine (ANTIVERT) 25 MG tablet Take 1 tablet (25 mg total) by mouth 2 (two) times daily as needed for dizziness. 04/16/21   Frann Rider, NP  metoprolol tartrate (LOPRESSOR) 25 MG tablet Take 0.5 tablets (12.5 mg total) by mouth 2 (two) times daily. 04/19/21 07/18/21  [name], PA  naproxen (NAPROSYN) 500 MG tablet Take 1 tablet (500 mg total) by mouth 2 (two) times daily. 07/12/21   Dorie Rank, MD  triamcinolone cream (KENALOG) 0.1 % Apply to affected area 1-2 times daily 06/19/20   [name], PA    Allergies    Pravastatin and Tramadol  Review of Systems   Review of Systems  Constitutional:  Negative for chills and fever.  HENT:  Negative for ear pain and sore throat.   Eyes:  Negative for pain and visual disturbance.  Respiratory:  Negative for cough  and shortness of breath.   Cardiovascular:  Negative for chest pain and palpitations.  Gastrointestinal:  Positive for abdominal pain and nausea. Negative for vomiting.  Genitourinary:  Negative for dysuria and hematuria.  Musculoskeletal:  Negative for arthralgias and back pain.  Skin:  Negative for color change and rash.  Neurological:  Negative for seizures and syncope.  All other systems reviewed and are negative.  Physical Exam Updated Vital Signs BP 120/79   Pulse 93   Temp 98.1 F (36.7 C) (Oral)   Resp 12   Ht 5' (1.524 m)   Wt 63.5 kg   LMP 01/16/2020   SpO2 100%   Breastfeeding Unknown   BMI 27.34 kg/m   Physical Exam Vitals and nursing note reviewed.  Constitutional:      General: She is not in acute distress.    Appearance: She is well-developed.  HENT:     Head: Normocephalic and atraumatic.  Eyes:     Conjunctiva/sclera: Conjunctivae normal.  Cardiovascular:     Rate and Rhythm: Normal rate and regular rhythm.     Heart sounds: No murmur heard. Pulmonary:     Effort: Pulmonary effort is normal. No respiratory distress.     Breath sounds: Normal breath sounds.  Abdominal:     Palpations: Abdomen is soft.     Tenderness: There is abdominal tenderness in the right lower quadrant.  Musculoskeletal:     Cervical back: Neck supple.  Skin:    General: Skin is warm and dry.  Neurological:     Mental Status: She is alert.    ED Results / Procedures / Treatments   Labs (all labs ordered are listed, but only abnormal results are displayed) Labs Reviewed  URINALYSIS, ROUTINE W REFLEX MICROSCOPIC - Abnormal; Notable for the following components:      Result Value   Specific Gravity, Urine >1.046 (*)    Hgb urine dipstick SMALL (*)    Leukocytes,Ua SMALL (*)    Bacteria, UA RARE (*)    All other components within normal limits  CBC WITH DIFFERENTIAL/PLATELET  COMPREHENSIVE METABOLIC PANEL  LIPASE, BLOOD  TSH  I-STAT BETA HCG BLOOD, ED ([facility], [facility], [facility] ONLY)     EKG None  Radiology CT Abdomen Pelvis W Contrast  Result Date: 08/20/2021 CLINICAL DATA:  RIGHT lower quadrant abdominal pain, history hypertension, stroke EXAM: CT ABDOMEN AND PELVIS WITH CONTRAST TECHNIQUE:  Multidetector CT imaging of the abdomen and pelvis was performed using the standard protocol following bolus administration of intravenous contrast. Sagittal and coronal MPR images reconstructed from axial data set. CONTRAST:  69mL OMNIPAQUE IOHEXOL 350 MG/ML SOLN IV. No oral contrast. COMPARISON:  04/06/2021 FINDINGS: Lower chest: Lung bases clear Hepatobiliary: Two large RIGHT lobe liver masses are again identified. The more caudal lesion measures 6.3 x 5.1 x 5.0 cm. The more cranial lesion measures 10.3 x 6.8 x 6.7 cm. Both lesions demonstrate early nodular peripheral dense enhancement with gradual fill-in on delayed images consistent with hepatic hemangiomata. Pancreas: Normal appearance Spleen: Normal appearance Adrenals/Urinary Tract: Adrenal glands, kidneys, ureters, and bladder normal appearance Stomach/Bowel: Normal appendix. Stomach and bowel loops normal appearance. Vascular/Lymphatic: Vascular structures patent. Aorta normal caliber. Retroaortic LEFT renal vein. No adenopathy. Reproductive: Unremarkable uterus and adnexa Other: No free air or free fluid. No hernia or inflammatory process. Musculoskeletal: Unremarkable IMPRESSION: Two large RIGHT lobe hepatic hemangiomata, grossly unchanged. No acute intra-abdominal or intrapelvic abnormalities. Electronically Signed   By: Lavonia Dana M.D.   On: 08/20/2021 09:13    Procedures Procedures   Medications Ordered in ED Medications  ketorolac (TORADOL) 15 MG/ML injection 15 mg (15 mg Intravenous Given 08/20/21 0919)  ondansetron (ZOFRAN) injection 4 mg (4 mg Intravenous Given 08/20/21 0919)  iohexol (OMNIPAQUE) 350 MG/ML injection 70 mL (70 mLs Intravenous Contrast Given 08/20/21 0856)    ED Course  I have reviewed the triage vital  signs and the nursing notes.  Pertinent labs & imaging results that were available during my care of the patient were reviewed by me and considered in my medical decision making (see chart for details).    MDM Rules/Calculators/A&P                           Patient seen the emergency department for evaluation of right lower quadrant abdominal pain.  Physical exam reveals a benign abdomen with mild tenderness in the right lower quadrant but no rebound or guarding.  Laboratory evaluation is unremarkable.  CT abdomen pelvis unremarkable outside of large but stable liver hemangiomas.  I went over the patient's CT scan in detail with her and her pain was controlled with Toradol.  On reevaluation, patient's pain had improved completely.  Patient requested thyroid testing which we ordered here in the emergency department and I will call the patient if the test is abnormal.  She was then discharged with primary care follow-up and return precautions of which she voiced understanding. Final Clinical Impression(s) / ED Diagnoses Final diagnoses:  Right lower quadrant abdominal pain  Liver hemangioma    Rx / DC Orders ED Discharge Orders     None        Carolie Mcilrath, MD 08/20/21 1140

## 2021-08-20 NOTE — Discharge Instructions (Addendum)
You were seen in the emergency department for evaluation of abdominal pain.  While here in the emergency department, your laboratory evaluation and a CAT scan was performed that was reassuringly negative for acute infection or surgical problems in your abdomen.  Your CT scan does show fairly large liver hemangiomas which are benign and almost never needs surgery, but please talk with your primary care physician about next steps.  At this time you are safe for discharge.  Please return to the emergency department if he have new or worsening abdominal pain, persistent vomiting, fever or any other concerning symptoms.

## 2021-09-30 ENCOUNTER — Other Ambulatory Visit: Payer: Self-pay | Admitting: Physician Assistant

## 2021-10-05 ENCOUNTER — Ambulatory Visit: Payer: Medicaid Other | Admitting: Physician Assistant

## 2021-10-08 ENCOUNTER — Ambulatory Visit: Payer: Medicaid Other | Admitting: Physician Assistant

## 2021-10-08 NOTE — Progress Notes (Incomplete)
Carla [name] is a 50 y.o. female here for a follow up of a pre-existing problem.  SCRIBE STATEMENT  History of Present Illness:   No chief complaint on file.   HPI  HLD   [name] presents today in regards to learning how she can better control her cholesterol levels. Currently [name] is not on any medication due to significant issues with tolerating statins. 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   Hyperlipidemia    Hypertension    Stroke ([place])    09/2015   Vertigo      Social History   Tobacco Use   Smoking status: Never   Smokeless tobacco: Never  Vaping Use   Vaping Use: Never used  Substance Use Topics   Alcohol use: No   Drug use: No    Past Surgical History:  Procedure Laterality Date   RIGHT ARM SURGERY     TEE WITHOUT CARDIOVERSION N/A 10/13/2015   Procedure: TRANSESOPHAGEAL ECHOCARDIOGRAM (TEE);  Surgeon: Lelon Perla, MD;  [facility]: [facility] ENDOSCOPY;  Service: Cardiovascular;  Laterality: N/A;    Family History  Problem Relation Age of Onset   Leukemia Mother    Colon cancer Brother    Asthma Neg Hx    Cancer Neg Hx    Diabetes Neg Hx    Hyperlipidemia Neg Hx    Heart failure Neg Hx    Hypertension Neg Hx    Migraines Neg Hx    Rashes / Skin problems Neg Hx    Seizures Neg Hx    Stroke Neg Hx    Thyroid disease Neg Hx     Allergies  Allergen Reactions   Pravastatin Anxiety   Tramadol Nausea Only    Current Medications:   Current Outpatient Medications:    amitriptyline (ELAVIL) 25 MG tablet, Take 1 tablet (25 mg total) by mouth at bedtime., Disp: 90 tablet, Rfl: 3   ASPIRIN LOW DOSE 81 MG EC tablet, Take 81 mg by mouth daily., Disp: , Rfl:    fenofibrate 54 MG tablet, TAKE 1 TABLET BY MOUTH DAILY. WITH FOOD, Disp: 90 tablet, Rfl: 1   meclizine (ANTIVERT) 25 MG tablet, Take 1 tablet (25 mg total) by mouth 2 (two) times daily as needed for  dizziness., Disp: 90 tablet, Rfl: 0   metoprolol tartrate (LOPRESSOR) 25 MG tablet, TAKE 0.5 TABLETS BY MOUTH 2 TIMES DAILY., Disp: 90 tablet, Rfl: 1   naproxen (NAPROSYN) 500 MG tablet, Take 1 tablet (500 mg total) by mouth 2 (two) times daily., Disp: 30 tablet, Rfl: 0   triamcinolone cream (KENALOG) 0.1 %, Apply to affected area 1-2 times daily, Disp: 30 g, Rfl: 0   Review of Systems:   ROS Negative unless otherwise specified per HPI. Vitals:   There were no vitals filed for this visit.   There is no height or weight on file to calculate BMI.  Physical Exam:   Physical Exam  Assessment and Plan:       I,Havlyn C Ratchford,acting as a scribe for [name], PA.,have documented all relevant documentation on the behalf of [name], PA,as directed by  [name], PA while in the presence of [name], [place].   ***  [name], PA-C

## 2021-10-10 ENCOUNTER — Other Ambulatory Visit: Payer: Self-pay

## 2021-10-10 ENCOUNTER — Ambulatory Visit (INDEPENDENT_AMBULATORY_CARE_PROVIDER_SITE_OTHER): Payer: Self-pay | Admitting: Physician Assistant

## 2021-10-10 ENCOUNTER — Encounter: Payer: Self-pay | Admitting: Physician Assistant

## 2021-10-10 VITALS — BP 140/90 | HR 99 | Temp 97.6°F | Ht 60.0 in | Wt 149.5 lb

## 2021-10-10 DIAGNOSIS — I10 Essential (primary) hypertension: Secondary | ICD-10-CM

## 2021-10-10 DIAGNOSIS — H538 Other visual disturbances: Secondary | ICD-10-CM

## 2021-10-10 DIAGNOSIS — E785 Hyperlipidemia, unspecified: Secondary | ICD-10-CM

## 2021-10-10 DIAGNOSIS — R221 Localized swelling, mass and lump, neck: Secondary | ICD-10-CM

## 2021-10-10 NOTE — Progress Notes (Signed)
Carla [name] is a 50 y.o. female here for a follow up of a pre-existing problem.  History of Present Illness:   Chief Complaint  Patient presents with   Hyperlipidemia    Today's visit was interpreted by Lyla Son.   HLD Carla [name] presents with concerns regarding her cholesterol levels. States she has been non compliant with any medications due to having significant issues tolerating statins. The main adverse effect she has been experiencing is fullness in her neck. She has expressed she feels the swelling does get worse if she continues to take the medication.   Carla [name] is interested in looking into this issue further as well as finding an alternative to keep her cholesterol levels managed.   HTN Currently compliant with taking lopressor 12.5 mg twice daily with an increase in energy being her only effect.  At home blood pressure readings are: not checked. Pt reports she has been feeling like her BP is elevated for some time now. Patient denies chest pain, SOB, blurred vision, dizziness, unusual headaches, lower leg swelling. Denies excessive caffeine intake, stimulant usage, excessive alcohol intake, or increase in salt consumption.  BP Readings from Last 3 Encounters:  10/10/21 140/90  08/20/21 130/80  07/12/21 126/81   Abdominal Pain On 08/19/21, Carla [name] visited the ED with c/o right lower quadrant abdominal pain. At the time she was also experiencing nausea and had an episode of hematochezia three days prior to visit. After undergoing a CT of abdomen and pelvis, it was found that she had two large but stale hemangiomas.   Blurred Vision Carla [name] states she has also been experiencing blurred vision following her recovery from [facility] in August of 2020. She currently wears glasses but reports she feels the need to get a new pair due to effectiveness of current pair. She has not had an eye exam in > 1 year. Denies: unusual HA, weakness, lightheadedness.   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   Hyperlipidemia    Hypertension    Stroke ([place])    09/2015   Vertigo      Social History   Tobacco Use   Smoking status: Never   Smokeless tobacco: Never  Vaping Use   Vaping Use: Never used  Substance Use Topics   Alcohol use: No   Drug use: No    Past Surgical History:  Procedure Laterality Date   RIGHT ARM SURGERY     TEE WITHOUT CARDIOVERSION N/A 10/13/2015   Procedure: TRANSESOPHAGEAL ECHOCARDIOGRAM (TEE);  Surgeon: Lelon Perla, MD;  Location: [facility] ENDOSCOPY;  Service: Cardiovascular;  Laterality: N/A;    Family History  Problem Relation Age of Onset   Leukemia Mother    Colon cancer Brother    Asthma Neg Hx    Cancer Neg Hx    Diabetes Neg Hx    Hyperlipidemia Neg Hx    Heart failure Neg Hx    Hypertension Neg Hx    Migraines Neg Hx    Rashes / Skin problems Neg Hx    Seizures Neg Hx    Stroke Neg Hx    Thyroid disease Neg Hx     Allergies  Allergen Reactions   Pravastatin Anxiety   Tramadol Nausea Only    Current Medications:   Current Outpatient Medications:    amitriptyline (ELAVIL) 25 MG tablet, Take 1 tablet (25 mg total) by mouth at bedtime., Disp:  90 tablet, Rfl: 3   ASPIRIN LOW DOSE 81 MG EC tablet, Take 81 mg by mouth daily., Disp: , Rfl:    fenofibrate 54 MG tablet, TAKE 1 TABLET BY MOUTH DAILY. WITH FOOD, Disp: 90 tablet, Rfl: 1   meclizine (ANTIVERT) 25 MG tablet, Take 1 tablet (25 mg total) by mouth 2 (two) times daily as needed for dizziness., Disp: 90 tablet, Rfl: 0   metoprolol tartrate (LOPRESSOR) 25 MG tablet, TAKE 0.5 TABLETS BY MOUTH 2 TIMES DAILY., Disp: 90 tablet, Rfl: 1   triamcinolone cream (KENALOG) 0.1 %, Apply to affected area 1-2 times daily, Disp: 30 g, Rfl: 0   Review of Systems:   ROS Negative unless otherwise specified per HPI.  Vitals:   Vitals:   10/10/21 1555  BP: 140/90  Pulse: 99  Temp: 97.6 F (36.4 C)   TempSrc: Temporal  SpO2: 97%  Weight: 149 lb 8 oz (67.8 kg)  Height: 5' (1.524 m)     Body mass index is 29.2 kg/m.  Physical Exam:   Physical Exam Vitals and nursing note reviewed.  Constitutional:      General: She is not in acute distress.    Appearance: She is well-developed. She is not ill-appearing or toxic-appearing.  Neck:     Comments:  Palpable fullness in upper neck area under chin without TTP Cardiovascular:     Rate and Rhythm: Normal rate and regular rhythm.     Pulses: Normal pulses.     Heart sounds: Normal heart sounds, S1 normal and S2 normal.  Pulmonary:     Effort: Pulmonary effort is normal.     Breath sounds: Normal breath sounds.  Skin:    General: Skin is warm and dry.  Neurological:     Mental Status: She is alert.     GCS: GCS eye subscore is 4. GCS verbal subscore is 5. GCS motor subscore is 6.  Psychiatric:        Speech: Speech normal.        Behavior: Behavior normal. Behavior is cooperative.    Assessment and Plan:   Essential Hypertension - Uncontrolled, has been compliant with medication - Continue Lopressor 12.5 mg daily  - Will update CBC and CMP today  - Continue efforts to monitor BP at home; if consistently >130/90, I have asked her to let us know  Blurred Vision, Bilaterally - Schedule an eye exam with ophthalmologist as soon as possible - Follow-up with [place] if further concerns or if recommended by eye doctor  Localized swelling, mass and lump neck  - Unclear etiology - Will obtain imaging (u/s) for further evaluation and management of symptoms -Follow-up based on u/s result and clinical symptoms  Hyperlipidemia, unspecified type of hyperlipidemia - Update lipid panel and adjust fenofibrate 54 mg as indicated -Statin-intolerant; continue ASA   I,Havlyn C Ratchford,acting as a scribe for [employer], PA.,have documented all relevant documentation on the behalf of [name], PA,as directed by  [name], PA  while in the presence of [name], [place].  I, [name], [place], have reviewed all documentation for this visit. The documentation on 10/10/21 for the exam, diagnosis, procedures, and orders are all accurate and complete.   [name], PA-C

## 2021-10-10 NOTE — Patient Instructions (Signed)
It was great to see you!  Please schedule a visit with your eye doctor  Please call to schedule you ultrasound of your neck next week [phone]  I will be in touch with your blood work results  Please keep checking your blood pressure If it is consistently > 130/90, let me know  Take care,  [name] PA-C

## 2021-10-11 LAB — CBC WITH DIFFERENTIAL/PLATELET
Basophils Absolute: 0.1 10*3/uL (ref 0.0–0.1)
Basophils Relative: 0.7 % (ref 0.0–3.0)
Eosinophils Absolute: 0.1 10*3/uL (ref 0.0–0.7)
Eosinophils Relative: 1.4 % (ref 0.0–5.0)
HCT: 38 % (ref 36.0–46.0)
Hemoglobin: 12.8 g/dL (ref 12.0–15.0)
Lymphocytes Relative: 28.9 % (ref 12.0–46.0)
Lymphs Abs: 2.6 10*3/uL (ref 0.7–4.0)
MCHC: 33.7 g/dL (ref 30.0–36.0)
MCV: 88.5 fl (ref 78.0–100.0)
Monocytes Absolute: 0.7 10*3/uL (ref 0.1–1.0)
Monocytes Relative: 7.9 % (ref 3.0–12.0)
Neutro Abs: 5.5 10*3/uL (ref 1.4–7.7)
Neutrophils Relative %: 61.1 % (ref 43.0–77.0)
Platelets: 364 10*3/uL (ref 150.0–400.0)
RBC: 4.3 Mil/uL (ref 3.87–5.11)
RDW: 13.3 % (ref 11.5–15.5)
WBC: 8.9 10*3/uL (ref 4.0–10.5)

## 2021-10-11 LAB — LIPID PANEL
Cholesterol: 192 mg/dL (ref 0–200)
HDL: 52.8 mg/dL (ref >39.00)
LDL Cholesterol: 116 mg/dL — ABNORMAL HIGH (ref 0–99)
NonHDL: 139.02
Total CHOL/HDL Ratio: 4
Triglycerides: 115 mg/dL (ref 0.0–149.0)
VLDL: 23 mg/dL (ref 0.0–40.0)

## 2021-10-11 LAB — TSH: TSH: 1.35 u[IU]/mL (ref 0.35–5.50)

## 2021-10-11 LAB — HEMOGLOBIN A1C: Hgb A1c MFr Bld: 5.7 % (ref 4.6–6.5)

## 2021-10-12 ENCOUNTER — Other Ambulatory Visit: Payer: Self-pay | Admitting: *Deleted

## 2021-10-12 ENCOUNTER — Other Ambulatory Visit: Payer: Self-pay | Admitting: Physician Assistant

## 2021-10-12 MED ORDER — FENOFIBRATE 145 MG PO TABS
145.0000 mg | ORAL_TABLET | Freq: Every day | ORAL | 1 refills | Status: DC
Start: 2021-10-12 — End: 2022-08-07

## 2021-10-17 ENCOUNTER — Other Ambulatory Visit: Payer: Self-pay

## 2021-10-17 ENCOUNTER — Ambulatory Visit (INDEPENDENT_AMBULATORY_CARE_PROVIDER_SITE_OTHER): Payer: Self-pay | Admitting: Adult Health

## 2021-10-17 ENCOUNTER — Encounter: Payer: Self-pay | Admitting: Adult Health

## 2021-10-17 ENCOUNTER — Ambulatory Visit (HOSPITAL_COMMUNITY): Payer: Self-pay

## 2021-10-17 VITALS — BP 144/86 | HR 114 | Ht 65.0 in | Wt 149.0 lb

## 2021-10-17 DIAGNOSIS — R42 Dizziness and giddiness: Secondary | ICD-10-CM

## 2021-10-17 DIAGNOSIS — Z8673 Personal history of transient ischemic attack (TIA), and cerebral infarction without residual deficits: Secondary | ICD-10-CM

## 2021-10-17 DIAGNOSIS — G4489 Other headache syndrome: Secondary | ICD-10-CM

## 2021-10-17 DIAGNOSIS — G43109 Migraine with aura, not intractable, without status migrainosus: Secondary | ICD-10-CM

## 2021-10-17 MED ORDER — MECLIZINE HCL 25 MG PO TABS: 25.0000 mg | ORAL_TABLET | Freq: Two times a day (BID) | ORAL | 11 refills | Status: DC | PRN

## 2021-10-17 MED ORDER — TRIAMCINOLONE ACETONIDE 0.1 % EX CREA: TOPICAL_CREAM | CUTANEOUS | 5 refills | Status: DC

## 2021-10-17 NOTE — Patient Instructions (Addendum)
Your Plan:  No changes today - ensure you follow up with your PCP if you continue to experience draining from ear - may need this further evaluated by ENT (ears, nose and throat specialist)   Highly encourage you to apply for [facility] Financial assistance so we can pursue sleep study to further evaluate for sleep apnea which could be contributing for your fatigue, morning headaches and memory concerns    Follow up in 1 year or call earlier if needed     Thank you for coming to see [place] at [facility]. I hope we have been able to provide you high quality care today.  You may receive a patient satisfaction survey over the next few weeks. We would appreciate your feedback and comments so that we may continue to improve ourselves and the health of our patients.   Apnea del sueo Sleep Apnea La apnea del sueo es una afeccin en la que la respiracin se detiene o se hace superficial mientras duerme. Las personas con apnea del sueo suelen Photographer. Pueden tener [name] se quedan sin aliento y dejan de respirar durante 10 segundos o ms durante el sueo. Esto puede suceder muchas veces durante la noche. La apnea del sueo interrumpe el sueo y evita que el cuerpo descanse como lo necesita. Esta afeccin puede aumentar el riesgo de sufrir ciertos problemas de [place], como: Infarto de miocardio. Accidente cerebrovascular. Obesidad. Diabetes tipo 2. Insuficiencia cardaca. Latidos cardacos irregulares. Presin arterial alta. [place] del tratamiento es ayudarle a respirar normalmente otra vez. Cules son las causas? La causa ms frecuente de la apnea del sueo es la obstruccin o el colapso de las vas respiratorias. Existen tres tipos de apnea del sueo: Apnea obstructiva del sueo. Este tipo de apnea ocurre cuando las vas respiratorias se obstruyen o colapsan. Apnea central del sueo. Este tipo ocurre cuando la parte del cerebro que controla la  respiracin no enva las seales correctas a los msculos que controlan la respiracin. Apnea mixta del sueo. Esta es una combinacin de apnea obstructiva y central del sueo. Qu incrementa el riesgo? Es ms probable que tenga esta afeccin si: Tiene sobrepeso. Fuma. Tiene vas respiratorias ms pequeas que lo normal. Es de edad avanzada. Es hombre. Bebe alcohol. Toma sedantes o tranquilizantes. Tiene antecedentes familiares de apnea del sueo. Tiene la lengua o las amgdalas ms grandes de lo normal. Cules son los signos o sntomas? Los sntomas de esta afeccin incluyen: Dificultad para Public affairs consultant dormido. Ronquidos fuertes. Dolores de cabeza matutinos. Despertarse con falta de aliento. Sequedad de boca o dolor de garganta por la maana. Somnolencia y Nurse, learning disability. Si tiene fatiga diurna debido a la apnea del sueo, podra ser ms propenso a tener: Dificultad para concentrarse. Olvidos. Irritabilidad o cambios de humor. Cambios en la personalidad. Sentimientos de depresin. Disfuncin sexual. Esto puede incluir la prdida de inters si es mujer o la disfuncin erctil si es hombre. Cmo se diagnostica? Esta afeccin se puede diagnosticar con lo siguiente: Los antecedentes mdicos. Un examen fsico. Ardelia Mems serie de pruebas que se realizan [employer] persona duerme (estudio del sueo). Estas pruebas generalmente se hacen en un laboratorio del sueo, pero tambin pueden Development worker, international aid. Cmo se trata? El tratamiento de esta afeccin tiene como objetivo restablecer la respiracin normal y [employer] sntomas durante el sueo. Puede implicar controlar los problemas de salud que pueden afectar la respiracin, como la presin arterial alta o la obesidad. El tratamiento puede incluir:  Dormir de Associate Professor. Si tiene congestin nasal, Tax adviser. Evitar el consumo de depresores, como el alcohol, sedantes y narcticos. Si tiene sobrepeso, [employer]. Realizar cambios en la dieta. Dejar de fumar. Usar un dispositivo para abrir las vas respiratorias mientras duerme; por ejemplo: Un aparato bucal. Se trata de una boquilla hecha a medida que desplaza la mandbula hacia adelante. Un dispositivo de presin positiva de las vas areas continua (continuous positive airway pressure, CPAP). Este dispositivo sopla aire a travs de una mscara cuando usted exhala. Un dispositivo de presin positiva de las vas areas espiratoria nasal (expiratory positive airway pressure, EPAP). Este dispositivo tiene vlvulas que se colocan en cada fosa nasal. Un dispositivo de bipresin positiva en las vas areas (bi-level positive airway pressure, BIPAP). Este dispositivo sopla aire a travs de una mscara cuando usted inhala y exhala. Someterse a Psychiatric nurse tratamientos no Barista. Durante la ciruga, el exceso de tejido se elimina para Market researcher las vas respiratorias. Siga estas indicaciones en su casa: Estilo de vida Haga cambios en su estilo de vida segn las recomendaciones de su mdico. Consuma una dieta sana y [place]. Si tiene sobrepeso, tome medidas para bajar de [place]. Evite el uso de depresores, como el alcohol, sedantes y narcticos. No consuma ningn producto que contenga nicotina o tabaco. Estos productos incluyen cigarrillos, tabaco para Higher education careers adviser y aparatos de vapeo, como los Psychologist, sport and exercise. Si necesita ayuda para dejar de consumir estos productos, consulte al [employer]. Indicaciones generales Use los medicamentos de venta libre y los recetados solamente como se lo haya indicado el mdico. Si le proporcionaron un dispositivo para abrir las vas respiratorias mientras duerme, selo solamente como se lo haya indicado el mdico. Si va a someterse a una ciruga, no olvide informarle al mdico que tiene apnea del sueo. Puede ser necesario que lleve su dispositivo consigo. Concurra a [place].  Esto es importante. Comunquese con un mdico si: El dispositivo que le proporcionaron para abrir las vas respiratorias mientras duerme es incmodo o no parece funcionar. Sus sntomas no mejoran. Sus sntomas empeoran. Solicite ayuda de inmediato si: Tiene los siguientes signos y sntomas: Tourist information centre manager. Falta de aire. Malestar en la espalda, los b[place]os o el [place]. Tiene lo siguiente: Dificultad para hablar. Debilidad en un lado del cuerpo. Se le cae el rostro. Estos sntomas pueden representar un problema grave que constituye Engineer, maintenance (IT). No espere a ver si los sntomas desaparecen. Solicite atencin mdica de inmediato. Comunquese con el servicio de emergencias de su localidad (911 en los [place]). No conduzca por sus propios medios [employer] hospital. Resumen La apnea del sueo es una afeccin en la que la respiracin se detiene o se hace superficial mientras duerme. La causa ms frecuente es la obstruccin o el colapso de las vas respiratorias. El objetivo del tratamiento es restablecer la respiracin normal y Public house manager los sntomas durante el sueo. Esta informacin no tiene Marine scientist el consejo del mdico. Asegrese de hacerle al mdico cualquier pregunta que tenga. Document Revised: 07/25/2021 Document Reviewed: 11/06/2020 Elsevier Patient Education  2022 [employer].

## 2021-10-17 NOTE — Progress Notes (Signed)
[name] [address] [place]. [place] [address] [phone]       OFFICE FOLLOW UP NOTE  Carla [name] Date of Birth:  June 11, 1971 Medical Record Number:  [id number]   [facility] provider: Dr. Pearlean Brownie Reason for Referral: Recurrence of migraines   Chief complaint Chief Complaint  Patient presents with   Follow-up    Rm 2 with [facility] interpreter Pt is well, migraines have improves, she has very few now. Has about 2-3 headaches a month but she does wake up with a very light headache every morning  She is having a issues with forgetting what she was doing or going but then remembers.      HPI:  Update 10/17/2021 JM: Returns for 65-month follow-up accompanied by [name] interpreter  Migraines stable - reports 2-3 migraines per month.  Remains on amitriptyline 25 mg nightly Does endorse awakening with very mild headache every morning  Vertigo stable as long as she takes meclizine once daily. She did try to stop meclizine but felt nauseous without it. Feels like liquid is draining from left ear - was seen by PCP back in August regarding this concern - she was referred to vestibular rehab due to worsening vertigo but did not attend due to lack of insurance. Reports when liquid is coming from ears is when she will feel nauseous and loses balance. Reports seeing a doctor for this concern last year but unsure if this was a specialist (unable to view via epic)  She has also been experiencing forgetfulness which is not new. Examples include she will forget what she is supposed to be doing in the moment but will remember later on. She may misplace items but again remember where she placed them later on. Reports sleeping well at night but is fatigued during the day. Reports husband has told her she snores. No witnessed apneas.   No new stroke/TIA symptoms. Compliant on aspirin 81 mg daily and fenofibrate -denies side effects Recent LDL 116 (PCP increased fenofibrate from  54mg  to 145 mg daily) Blood pressure today 144/86  No further concerns at this time     History provided for reference purposes only Update 04/16/2021 JM: Carla [name] returns for 81-month follow-up accompanied by [facility] interpreter.  Migraines have been stable - reports 1-2 per month typically lasting 30-40 minutes. Will use tylenol on occasion with benefit. She has remained on amitrityline but reports not being on effexor (denies ever being on it)  Vertigo stable occurring 1-2x weekly - will use meclizine only as needed with benefit  Stable from stroke standpoint without new stroke/TIA symptoms.  Reports intermittent use of aspirin 81mg  daily and daily use of fenofibrate without associated side effects.  PCP recently recommended started atorvastatin but she denies receiving prescription due to lipid panel showing LDL 122.  She has trialed atorvastatin in the past but unsure if reported side effects at that time from statin or multiple other medications being taken at this time.  She is willing to trial again.  Blood pressure today 128/87.    No further concerns at this time   Update 10/16/2020 JM: Carla [name] returns for 7-month follow-up accompanied by interpreter.  At prior visit, complaints of continued migraines which have since greatly improved with use of amitriptyline 25 mg nightly and venlafaxine 37.5 mg daily tolerating well without side effects.  Reports 1 migraine over the past month.  She does report occasional tension type headaches but has been told likely due to new rx  lenses and have been slowly improving.  Prior complaints of intermittent vertigo but overall stable and will take meclizine only as needed.  She does report evaluation by ENT Dr. Suszanne Conners for vertigo approximately 3 to 4 months ago but has not received results.  Stable from TIA/stroke standpoint without new or reoccurring stroke/TIA symptoms.  Remains on aspirin 81 mg daily and omega-3 for secondary stroke prevention  of side effects.  Blood pressure today 125/76.  No further concerns at this time.  Update 06/27/2020 JM: Carla [name] returns for follow-up accompanied by interpreter regarding reoccurrence of chronic migraines with underlying history of migraines, cryptogenic stroke 2016, TIA, vertigo, depression and anxiety. Evaluated at [facility] ED on 06/13/2020 due to pain and numbness in her left arm accompanied by headache. All work-up unremarkable including D-dimer, EKG, chest x-ray and CT head and treated for presumed cervical radiculopathy with naproxen and advised to follow-up with PCP.  Evaluated by PCP 06/19/2020 with complete resolution of symptoms. Of note, presented to ED on 03/16/2020 with chief complaint of confusion and left arm numbness with headache and diagnosed with complex migraine. Migraine occurrence has improved since prior visit with ongoing use of amitriptyline 10 mg nightly.  Severe migraine occurrence 4 -5 times per months but does experience tension type headaches almost every other day lasting for approximately 1 hour and are not debilitating.  Reports tension type headaches occur with increased stress or anxiety.  She has not had any reoccurrence of left arm numbness or any other neurological symptoms Intermittent vertigo and PCP recently referred to ENT She remains uninsured but is currently in the process of applying for Medicaid   Update 03/21/2020 JM, Carla [name] is being seen today accompanied by her son who assist with interpretation due to recent emergency room visit with left arm numbness and confusion accompanied by headache on 03/16/2020.  MRI brain negative for acute intracranial abnormality.  She was diagnosed with complex migraines as symptoms objective and no focal deficit identified.  She was advised to follow-up outpatient for further evaluation.  Prior medical history of cryptogenic stroke 2016, TIA, vertigo and migraines.  Previously treated in this office for migraine management on  Topamax 25 mg twice daily but self discontinued due to difficulty tolerating.  She has not had any difficulties with migraines in over the past year and has not required any treatment.  Typical migraines recurred approximately 2 weeks ago located right temporal and periorbital area with pulsating quality with blurred vision, photophobia, phonophobia and nausea.  Symptoms consistent with previously known migraines.  She may also experience pain in occipital region.  She has been experiencing migraines every other day which last up until 2 hours after laying down and resting.  She has not had any recurrence of confusion or numbness/tingling.  She also reports symptoms of fatigue, dizziness, and shortness of breath which appears to be anxiety related per son.  She also recently self discontinued lovastatin due to subjective increased anxiety after taking.  Previously discussed underlying anxiety at prior visits which likely contributing and/or worsening symptoms but she has previously declined further management or counseling.  She has continued on aspirin 81 mg daily without bleeding or bruising.  Blood pressure today 128/83.  No further concerns at this time.   Initial visit 09/29/2018 Dr. Pearlean Brownie:  Ms Medema is a pleasant 16 year Hispanic lady originally [place] who was seen today for evaluation for headaches.  She is accompanied by her friend as well as [place] language interpreter  who provides translation for this visit.  History is obtained from them, review of electronic medical records and have personally reviewed imaging films.  Patient has been complaining of increasing headaches for the last 2 months.  She describes this headache as being right temporal and frontal mostly throbbing in nature though fluctuating in severity from 6/10 to 10/10.  Headache is accompanied by nausea vomiting light and sound sensitivity.  She has to stop her activities and lie down and sleeping as well as taking Tylenol  seems to help.  Headaches are accompanied by a single vision spots and scintillations and blurred vision.  She denies any speech difficulties, vertigo, diplopia, gait or balance difficulties with headaches.  Headaches usually last about 30 to 40 minutes.  Headaches of late have been occurring about 2-3 times per week.  She does give a remote history of infrequent headaches which sound like migraine headaches and was similar but were mostly perimenstrual and not bad.  The patient also states in September 2019 she had a episode of speech difficulties as well as dizziness.  She was seen by primary care physician who states that the patient had a headache as well as feeling of dizziness and some speech difficulties.  This happened following a stressful event.  An outpatient MRI was ordered which was done on 08/05/2018 which I personally reviewed shows a few nonspecific white matter hyperintensities but no acute abnormality or definite stroke.  Patient states her symptoms improved within a few days but the headaches have persisted since then.  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 MRA of the brain showed slight diminished peripheral branches in the left MCA but no large vessel stenosis or occlusion. Carotid ultrasound showed no significant extracranial stenosis. Transthoracic echo showed normal ejection fraction. Transesophageal echocardiogram showed no cardiac source of embolism or PFO. Antiphospholipid antibodies and lupus anticoagulant were both negative. Lipid profile showed elevated total cholesterol 207 and LDL 139 mg percent. Hemoglobin A1c was 5.6. Patient was started on aspirin as well as protocol. She states her speech difficulty and right-sided weakness completely improved.  She was placed on aspirin and pravastatin which  she actually did well on but for unclear reason she discontinued both medications.  She is currently on Lipitor 40 which was started a few months ago by primary physician but she is complaining of dizziness on this medication and wants to change it or stop it.  She states she had slight balance difficulties from her previous stroke but had recovered well from that.  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  She states her blood pressure is under good control.  Last lipid profile checked in August 2019 showed LDL of 120 following which Lipitor was added.  I had recommended doing a transcranial Doppler bubble study at last visit with me in 2017 but for some unclear reason this was never done.  Update 12/30/2018: [name] is being seen today for follow-up visit for migraines and is accompanied by interpreter as patient is primarily Spanish-speaking.  Upon initial visit with Dr. Pearlean Brownie on 09/29/2018 (visit info below), headaches were not frequent enough to justify initiating migraine prophylaxis.  She did call back on 12/02/2018 reporting daily migraines and therefore initiated Topamax 25 mg twice daily.  She took this medication for  4 days but discontinued as medication was making her too fatigued.  She consider taking 1 tab at night only but wanted to speak with [place] in regards to this first.  She was evaluated in the ED on 12/19/2018 and returned on 12/23/2018 due to vaginal bleeding.  Apparently, all medications discontinued after 12/23/2018 visit and was started on Provera for 10-day course with recommending follow-up with OB/GYN for further evaluation.  She reports today that her headaches have improved and not experience a headache since ED admission on 12/23/2018.  She does have an appointment scheduled at [facility] on 01/12/2019 for further evaluation.  She denies any continued vaginal bleeding at this time.  Blood pressure today satisfactory 130/68. It was recommended  after prior visit to undergo transcranial Doppler with bubble study to assess for possible PFO.  This was obtained on 10/14/2018 and was negative for PFO.  Update 06/21/2019 : She returns for follow-up after last visit 5 months ago.  She is accompanied by a [place] language interpreter who was present throughout the visit and interprets for her.  Patient states her main complaint today is that her migraine headaches seem to have come back.  In the last 3 months she has had increased frequency of headaches which occur 3 to 4 days/week.  The headache starts off as a throbbing pain around her right temple as well as periorbital region becomes unbearable 10/10 in severity.  She has relied on.  Light and sound do worsen the headache.  She does not throw up or feel nauseous.  She does not have visual symptoms with the headaches.  Headache may last for most of the day.  She is unable to identify specific triggers for headaches but does feel tightness in the back of her head and neck muscles.  She has not tried triptans or prescription migraine specific medications she has tried taking Tylenol and ibuprofen without relief.  She has been taking naproxen which seems to work better.  She has been taking them 3 to 4 days a week.  She was previously on Topamax but when she started having dysfunctional uterine bleeding all the medications were stopped including Topamax.  She is unable to tell me if she had any bad side effects on it.  Patient recently changed her primary care physician and a new physician assistant discontinued protocol for unclear reason.  She remains on aspirin which is tolerating well without bruising or bleeding.  Her blood pressure is under good control and today it is 142/83.  She has had no recurrent stroke or TIA symptoms.  Update 09/02/2019: Carla [name] is being seen today for 29-month follow-up regarding migraine headaches accompanied by interpreter.  After prior visit, Topamax 25 mg daily was  restarted due to worsening migraines as well as Relpax for emergent relief.  She did not tolerate Topamax as she reports this caused weight loss and short-term memory loss.  She was unable to afford Relpax.  She has recently been experiencing migraines 2 times weekly and typically lasts for 1 hour total but during this time, migraines are debilitating and continues to be located right temporal area.  She has not trialed any other type of migraine medication.  Recent diagnosis of COVID-19 in 06/2019 and feels as though she has not completely recovered mentally and physically.  After prior visit, lipid panel obtained and recommended restart pravastatin but due to side effects, PCP initiated Crestor.  She reports difficulty tolerating Crestor but plans on following up with PCP  for further monitoring and management.  Blood pressure today 135/91.  Update 11/04/2019: Carla [name] is a 50 year old female who is being seen today by patient request due to concerns of balance difficulties concern for stroke.  She is accompanied today by a interpreter.  She did not seek medical treatment at onset.  Approximately 1.5 weeks ago, she states she started experiencing intermittent balance impairment with a swaying type sensation and room spinning.  Symptoms severe where she will have to hold onto something and close her eyes.  She states she experienced same type of symptoms with prior stroke.  She denies associated headache, neck pain, speech difficulty, visual changes, weakness or numbness/tingling.  She denies increased stress or anxiety/depression.  She states standing straight up or laying flat or with head movement induces the symptoms.  She denies currently feeling symptoms at today's visit.  She does endorse recently stopping her metoprolol 3 days ago due to increased stress and irritability symptoms.  She does monitor blood pressure at home and SBP 130s.  She is unable to endorse diastolic pressures or heart rates.  Blood  pressure today satisfactory at 132/96.  He does endorse continuation of aspirin 81 mg daily and recently started on pravastatin approximately 3 weeks ago by PCP.  Update 11/09/2019: Carla [name] is a 50 year old female who is being seen today by patient request due to recent ED admission on 11/06/2019 for ongoing concerns of dizziness previously evaluated in office 5 days ago.  She is accompanied by interpreter.  Per review of ED note, she reported worsening dizziness and headache 2 days prior to ED admission concerning for stroke.  CT head and MRI brain no acute abnormalities.  She was advised to follow-up with PCP and neurology at discharge.  She has been doing well since discharge without any reoccurring vertigo or dizziness symptoms or headaches.  She has continued to take meclizine with benefit 25 mg tablets twice daily.  She continues on aspirin 81 mg daily and pravastatin for secondary stroke prevention without side effects. Blood pressure today 124/71.  No further concerns at this time.  Update 01/04/2020: Carla [name] is a 50 year old female who is being seen today for follow-up regarding prior concerns of dizziness accompanied by interpreter.  Denies residual/dizziness symptoms.  Continues twice daily meclizine with occasional fatigue.  Continues on aspirin 81 mg daily and pravastatin for secondary stroke prevention without side effects.  Blood pressure today 127/78.  Mild short-term memory loss but endorses ongoing improvement.  No concerns at this time.    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   Hyperlipidemia    Hypertension    Stroke (HCC)    09/2015   Vertigo     Social History:  Social History   Socioeconomic History   Marital status: Married    Spouse name: Not on file   Number of children: Not on file    Years of education: Not on file   Highest education level: Not on file  Occupational History   Occupation: Education officer, [employer]  Tobacco Use   Smoking status: Never   Smokeless tobacco: Never  Vaping Use   Vaping Use: Never used  Substance and Sexual Activity   Alcohol use: No   Drug use: No   Sexual activity: Not Currently  Birth control/protection: None  Other Topics Concern   Not on file  Social History Narrative   From [place]   Lived in [place] for 20 years, and been in [place] for 6 years   Husband and son, 9   Works: clean homes   Social Determinants of Corporate investment banker Strain: Not on [employer] Insecurity: Not on file  Transportation Needs: Not on file  Physical Activity: Not on file  Stress: Not on file  Social Connections: Not on file  Intimate Partner Violence: Not on file    Medications:   Current Outpatient Medications on File Prior to Visit  Medication Sig Dispense Refill   amitriptyline (ELAVIL) 25 MG tablet Take 1 tablet (25 mg total) by mouth at bedtime. 90 tablet 3   ASPIRIN LOW DOSE 81 MG EC tablet Take 81 mg by mouth daily.     fenofibrate (TRICOR) 145 MG tablet Take 1 tablet (145 mg total) by mouth daily. 90 tablet 1   meclizine (ANTIVERT) 25 MG tablet Take 1 tablet (25 mg total) by mouth 2 (two) times daily as needed for dizziness. 90 tablet 0   metoprolol tartrate (LOPRESSOR) 25 MG tablet TAKE 0.5 TABLETS BY MOUTH 2 TIMES DAILY. 90 tablet 1   triamcinolone cream (KENALOG) 0.1 % Apply to affected area 1-2 times daily 30 g 0   No current facility-administered medications on file prior to visit.    Allergies:   Allergies  Allergen Reactions   Pravastatin Anxiety   Tramadol Nausea Only    Physical Exam  Today's Vitals   10/17/21 0834  BP: (!) 144/86  Pulse: (!) 114  Weight: 149 lb (67.6 kg)  Height: 5\' 5"  (1.651 m)    Body mass index is 24.79 kg/m.  Physical exam General: well developed, well nourished, pleasant middle-aged  Hispanic female, seated, in no apparent distress Head: head normocephalic and atraumatic.   Neck: supple with no carotid or supraclavicular bruits Cardiovascular: regular rate and rhythm, no murmurs Musculoskeletal: no deformity Skin:  no rash/petichiae Vascular:  Normal pulses all extremities  Neurologic Exam Mental Status: awake and fully alert. Primarily Spanish-speaking with limited English. Denies speech and language concerns.  Oriented to place and time. Recent and remote memory intact. Attention span, concentration and fund of knowledge appropriate. Mood and affect appropriate Cranial Nerves: Pupils equal, briskly reactive to light.  Extraocular movements full without nystagmus. Visual fields full to confrontation. Hearing intact. Facial sensation intact. Face, tongue, palate moves normally and symmetrically.   Motor: Normal bulk and tone. Normal strength in all tested extremity muscles. Sensory.: intact to touch , pinprick , position and vibratory sensation.  Coordination: Rapid alternating movements normal in all extremities. Finger-to-nose and heel-to-shin performed accurately bilaterally. Gait and Station: Arises from chair without difficulty. Stance is normal. Gait demonstrates normal stride length and balance . Able to heel, toe and tandem walk without difficulty.  Romberg negative. Reflexes: 1+ and symmetric. Toes downgoing.      ASSESSMENT/PLAN:  Carla [name] is a 50 year old Hispanic lady with with PMH of cryptogenic stroke 2016, TIA, HDL, HTN, BPPV, anxiety and migraines.  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 -Currently well controlled experiencing 2-3 migraine days per month -Continue amitriptyline 25 mg  nightly - refill provided -Previously prescribed venlafaxine  but pt denies being on - as she has been stable, will hold off on restarting -hx of intolerance to Topamax  Vertigo, intermittent -Overall stable although continues to require meclizine daily -unknown cause of occasional "liquid coming from left ear" back and worsen vertigo and nausea- she was advised to follow back up with PCP - may benefit from ENT eval if not already completed -Meclizine refill provided  History of stroke/TIA -Continue aspirin 81 mg and fenofibrate 145mg  daily (PCP recently increased from 54mg  daily) for secondary stroke prevention.  Advised to contact PCP if difficulty tolerating statin and may need to consider use of PCSK9 inhibitor with history of statin intolerance -Continue to follow with PCP for HTN and HLD management and secondary stroke risk factor management with BP goal<130/90 and LDL goal<70 -Stroke labs 10/10/2021: LDL 116, A1c 5.7  Concern for possible sleep apnea -possibly contributing to daytime fatigue, short term memory loss concerns and dull morning headaches -she is currently uninsured - encouraged her to reapply for [employer] so we can pursue sleep study     Follow-up in 1 year or call earlier if needed   CC:  Jarold Motto, [place]     I spent 34 minutes of face-to-face and non-face-to-face time with patient assisted by interpreter.  This included previsit chart review, lab review, study review, order entry, electronic health record documentation, patient education and discussion regarding chronic migraines/headaches, chronic vertigo, concern of possible sleep apnea, memory loss concerns, hx of prior stroke and answered all other questions to patients satisfaction   Ihor Austin, AGNP-BC  [facility] [address] [place], [place] [address]  Phone [phone] Fax [phone] Note: This document was prepared with digital dictation  and possible smart phrase technology. Any transcriptional errors that result from this process are unintentional.

## 2021-10-24 ENCOUNTER — Ambulatory Visit (HOSPITAL_COMMUNITY): Admission: RE | Admit: 2021-10-24 | Payer: Self-pay | Source: Ambulatory Visit

## 2021-12-25 ENCOUNTER — Ambulatory Visit: Payer: Medicaid Other | Admitting: Physician Assistant

## 2021-12-25 NOTE — Progress Notes (Incomplete)
Carla [name] is a 51 y.o. female here for sinus congestion.  SCRIBE STATEMENT  History of Present Illness:   No chief complaint on file.   HPI Sinus Congestion Carla [name] presents with c/o sinus congestion that has been onset for ***. Associated sx are body aches ad headache.   Denies ***.  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   Hyperlipidemia    Hypertension    Stroke ([place])    09/2015   Vertigo      Social History   Tobacco Use   Smoking status: Never   Smokeless tobacco: Never  Vaping Use   Vaping Use: Never used  Substance Use Topics   Alcohol use: No   Drug use: No    Past Surgical History:  Procedure Laterality Date   RIGHT ARM SURGERY     TEE WITHOUT CARDIOVERSION N/A 10/13/2015   Procedure: TRANSESOPHAGEAL ECHOCARDIOGRAM (TEE);  Surgeon: Lelon Perla, MD;  Location: [facility] ENDOSCOPY;  Service: Cardiovascular;  Laterality: N/A;    Family History  Problem Relation Age of Onset   Leukemia Mother    Colon cancer Brother    Asthma Neg Hx    Cancer Neg Hx    Diabetes Neg Hx    Hyperlipidemia Neg Hx    Heart failure Neg Hx    Hypertension Neg Hx    Migraines Neg Hx    Rashes / Skin problems Neg Hx    Seizures Neg Hx    Stroke Neg Hx    Thyroid disease Neg Hx     Allergies  Allergen Reactions   Pravastatin Anxiety   Tramadol Nausea Only    Current Medications:   Current Outpatient Medications:    amitriptyline (ELAVIL) 25 MG tablet, Take 1 tablet (25 mg total) by mouth at bedtime., Disp: 90 tablet, Rfl: 3   ASPIRIN LOW DOSE 81 MG EC tablet, Take 81 mg by mouth daily., Disp: , Rfl:    fenofibrate (TRICOR) 145 MG tablet, Take 1 tablet (145 mg total) by mouth daily., Disp: 90 tablet, Rfl: 1   meclizine (ANTIVERT) 25 MG tablet, Take 1 tablet (25 mg total) by mouth 2 (two) times daily as needed for dizziness., Disp: 60 tablet, Rfl: 11   metoprolol tartrate (LOPRESSOR) 25 MG  tablet, TAKE 0.5 TABLETS BY MOUTH 2 TIMES DAILY., Disp: 90 tablet, Rfl: 1   triamcinolone cream (KENALOG) 0.1 %, Apply to affected area 1-2 times daily, Disp: 30 g, Rfl: 5   Review of Systems:   ROS  Vitals:   There were no vitals filed for this visit.   There is no height or weight on file to calculate BMI.  Physical Exam:   Physical Exam  Assessment and Plan:   @DIAGLIST @     ***  [name], PA-C

## 2022-01-28 ENCOUNTER — Ambulatory Visit: Payer: Medicaid Other | Admitting: Family Medicine

## 2022-01-29 ENCOUNTER — Emergency Department (HOSPITAL_COMMUNITY)
Admission: EM | Admit: 2022-01-29 | Discharge: 2022-01-29 | Disposition: A | Discharge status: Home / Self Care | Payer: Medicaid Other | Attending: Emergency Medicine | Admitting: Emergency Medicine

## 2022-01-29 DIAGNOSIS — Z7982 Long term (current) use of aspirin: Secondary | ICD-10-CM | POA: Insufficient documentation

## 2022-01-29 DIAGNOSIS — Z79899 Other long term (current) drug therapy: Secondary | ICD-10-CM | POA: Insufficient documentation

## 2022-01-29 DIAGNOSIS — K047 Periapical abscess without sinus: Secondary | ICD-10-CM | POA: Insufficient documentation

## 2022-01-29 MED ORDER — AMOXICILLIN-POT CLAVULANATE 875-125 MG PO TABS: 1.0000 | ORAL_TABLET | Freq: Two times a day (BID) | ORAL | 0 refills | Status: DC

## 2022-01-29 MED ORDER — AMOXICILLIN-POT CLAVULANATE 875-125 MG PO TABS
1.0000 | ORAL_TABLET | Freq: Once | ORAL | Status: AC
  Administered 2022-01-29: 1 via ORAL
  Filled 2022-01-29: qty 1

## 2022-01-29 MED ORDER — KETOROLAC TROMETHAMINE 60 MG/2ML IM SOLN
60.0000 mg | Freq: Once | INTRAMUSCULAR | Status: AC
  Administered 2022-01-29: 60 mg via INTRAMUSCULAR
  Filled 2022-01-29: qty 2

## 2022-01-29 NOTE — Discharge Instructions (Addendum)
Take Augmentin twice daily for the next 7 days.  Take ibuprofen 600 mg 3x times daily for the next week.  Call your dentist tomorrow to schedule follow-up appointment.  Return if worse ?

## 2022-01-29 NOTE — ED Provider Notes (Signed)
?[facility] ?Provider Note ? ? ?CSN: [id number] ?Arrival date & time: 01/29/22  1424 ? ?  ? ?History ? ?Chief Complaint  ?Patient presents with  ?? Dental Pain  ?? Facial Swelling  ? ? ?Anissa Abbs is a 51 y.o. female. ? ? ?Dental Pain ? ?Patient is a 51 year old female presenting with right upper jaw pain. Started yesterday, associated with some swelling.  She is able to swallow without difficulty, denies any fevers at home.  Denies any fracture to the jaw.  She does have a Pharmacist, community. ? ?Home Medications ?Prior to Admission medications   ?Medication Sig Start Date End Date Taking? Authorizing Provider  ?amitriptyline (ELAVIL) 25 MG tablet Take 1 tablet (25 mg total) by mouth at bedtime. 04/16/21   Frann Rider, NP  ?ASPIRIN LOW DOSE 81 MG EC tablet Take 81 mg by mouth daily. 04/08/20   [provider]  ?fenofibrate (TRICOR) 145 MG tablet Take 1 tablet (145 mg total) by mouth daily. 10/12/21   [name], PA  ?meclizine (ANTIVERT) 25 MG tablet Take 1 tablet (25 mg total) by mouth 2 (two) times daily as needed for dizziness. 10/17/21   Frann Rider, NP  ?metoprolol tartrate (LOPRESSOR) 25 MG tablet TAKE 0.5 TABLETS BY MOUTH 2 TIMES DAILY. 10/01/21   [name], PA  ?triamcinolone cream (KENALOG) 0.1 % Apply to affected area 1-2 times daily 10/17/21   Frann Rider, NP  ?   ? ?Allergies    ?Pravastatin and Tramadol   ? ?Review of Systems   ?Review of Systems ? ?Physical Exam ?Updated Vital Signs ?BP (!) 166/99 (BP Location: Left Arm)   Pulse 93   Temp 98.2 ?F (36.8 ?C) (Oral)   Resp 17   LMP 01/16/2020   SpO2 96%  ?Physical Exam ?Vitals and nursing note reviewed. Exam conducted with a chaperone present.  ?Constitutional:   ?   Appearance: Normal appearance.  ?HENT:  ?   Head: Normocephalic.  ?   Mouth/Throat:  ?   Mouth: Mucous membranes are moist.  ?   Pharynx: No oropharyngeal exudate or posterior oropharyngeal erythema.  ?   Comments: No sublingual  tenderness or swelling. Uvula is midline. No trismus. No dry sockets or pulp exposure. Handling secretions without difficulty. Dental abscess, upper jaw, right ?Eyes:  ?   Extraocular Movements: Extraocular movements intact.  ?   Pupils: Pupils are equal, round, and reactive to light.  ?Cardiovascular:  ?   Rate and Rhythm: Normal rate and regular rhythm.  ?Pulmonary:  ?   Effort: Pulmonary effort is normal.  ?   Breath sounds: Normal breath sounds.  ?Musculoskeletal:  ?   Cervical back: Normal range of motion. No rigidity or tenderness.  ?Neurological:  ?   Mental Status: She is alert.  ?Psychiatric:     ?   Mood and Affect: Mood normal.  ? ? ?ED Results / Procedures / Treatments   ?Labs ?(all labs ordered are listed, but only abnormal results are displayed) ?Labs Reviewed - No data to display ? ?EKG ?None ? ?Radiology ?No results found. ? ?Procedures ?Procedures  ? ? ?Medications Ordered in ED ?Medications  ?ketorolac (TORADOL) injection 60 mg (has no administration in time range)  ?amoxicillin-clavulanate (AUGMENTIN) 875-125 MG per tablet 1 tablet (has no administration in time range)  ? ? ?ED Course/ Medical Decision Making/ A&P ?  ?                        ?  Medical Decision Making ?Risk ?Prescription drug management. ? ? ?51 year old female presenting due to dental pain.  Started yesterday, physical exam shows dental abscess.  Uvula is midline, there is no trismus.  Handling secretions well without any fever or submandibular/sublingual swelling or tenderness.  Doubt Ludwig angina, retropharyngeal abscess, peritonsillar abscess.  She has a dentist who she is able to follow-up with.  Will start on Augmentin and give Toradol shot here in the ED.  Encourage patient to follow-up with dentist, return precautions given.  Discharged in stable condition. ? ? ?  ? ? ? ? ?Final Clinical Impression(s) / ED Diagnoses ?Final diagnoses:  ?None  ? ? ?Rx / DC Orders ?ED Discharge Orders   ? ? None  ? ?  ? ? ?  ?Sherrill Raring,  PA-C ?01/29/22 1919 ? ?  ?Carmin Muskrat, MD ?01/29/22 2103 ? ?

## 2022-01-29 NOTE — ED Triage Notes (Signed)
Pt. Stated my tooth started hurting yesterday and its hurt more and more. My face is swollen cause the bad tooth ?

## 2022-02-14 NOTE — Progress Notes (Signed)
Carla [name] is a 51 y.o. female here for a low blood pressure reading. ? ?History of Present Illness:  ? ?Chief Complaint  ?Carla [name] presents with  ? Numbness  ?  C/o increase urination and thirst and numbness all over body. Symptoms subside once Carla [name] eats sweets. Denies checking BP at home - just believed all symptoms were related to low BP  ? ?Clerance Lav acted as an Veterinary surgeon for [name] today.  ? ?HPI ? ?Cavernous Hemangioma of Liver; Abdominal Pain  ?Carla [name] expresses Carla [name] has been experiencing worsening RUQ abdominal pain and what Carla [name] describes as low blood pressure episodes for the past couple of months that have worsened within the past week. Carla [name] describes her abdominal pain as an inflamed and itchy sensation and states her episodes happen about 30 minutes after waking. Carla [name], reports Carla [name] experiences shakiness, pale hands, and dizziness during said episodes which led her to believe it was related to low blood pressure. Although Carla [name] had this belief Carla [name] didn't check her BP during any of these episodes. Instead Carla [name] attempted to manage this by eating something sweet which Carla [name] found to slightly improve her sx. Carla [name] does admit that while these episodes mainly occur in the morning, Carla [name] does have a couple in the afternoon on some days.  ? ?As the visit progressed, Carla [name] expressed worry that her two benign cavernous hemangiomas in her right hepatic lobe could be getting worse and causing all these issues. Carla [name] also expressed her belief that her past COVID infection two months ago could be the cause to these worsening sx. Carla [name] does admit to light nausea, slightly blurred vision, and little SOB. Due to all of this occurring, Carla [name] decided to have this further evaluated. Denies vomiting, increased alcohol intake, fever, chills, or CP.  ? ?Past Medical History:  ?Diagnosis Date  ? COVID-19   ? Depression   ? Frequent headaches   ? 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 Hyperlipidemia   ? Hypertension   ? Stroke  [facility])   ? 09/2015  ? Vertigo   ? ?  ?Social History  ? ?Tobacco Use  ? Smoking status: Never  ? Smokeless tobacco: Never  ?Vaping Use  ? Vaping Use: Never used  ?Substance Use Topics  ? Alcohol use: No  ? Drug use: No  ? ? ?Past Surgical History:  ?Procedure Laterality Date  ? RIGHT ARM SURGERY    ? TEE WITHOUT CARDIOVERSION N/A 10/13/2015  ? Procedure: TRANSESOPHAGEAL ECHOCARDIOGRAM (TEE);  Surgeon: Lelon Perla, MD;  Location: [facility];  Service: Cardiovascular;  Laterality: N/A;  ? ? ?Family History  ?Problem Relation Age of Onset  ? Leukemia Mother   ? Colon cancer Brother   ? Asthma Neg Hx   ? Cancer Neg Hx   ? Diabetes Neg Hx   ? Hyperlipidemia Neg Hx   ? Heart failure Neg Hx   ? Hypertension Neg Hx   ? Migraines Neg Hx   ? Rashes / Skin problems Neg Hx   ? Seizures Neg Hx   ? Stroke Neg Hx   ? Thyroid disease Neg Hx   ? ? ?Allergies  ?Allergen Reactions  ? Pravastatin Anxiety  ? Tramadol Nausea Only  ? ? ?Current Medications:  ? ?Current Outpatient Medications:  ?  amitriptyline (ELAVIL) 25 MG tablet, Take 1 tablet (25 mg total) by mouth at bedtime., Disp: 90 tablet, Rfl: 3 ?  ASPIRIN LOW DOSE 81 MG EC tablet, Take 81 mg by mouth daily.,  Disp: , Rfl:  ?  fenofibrate (TRICOR) 145 MG tablet, Take 1 tablet (145 mg total) by mouth daily., Disp: 90 tablet, Rfl: 1 ?  meclizine (ANTIVERT) 25 MG tablet, Take 1 tablet (25 mg total) by mouth 2 (two) times daily as needed for dizziness., Disp: 60 tablet, Rfl: 11 ?  metoprolol tartrate (LOPRESSOR) 25 MG tablet, TAKE 0.5 TABLETS BY MOUTH 2 TIMES DAILY., Disp: 90 tablet, Rfl: 1 ?  triamcinolone cream (KENALOG) 0.1 %, Apply to affected area 1-2 times daily, Disp: 30 g, Rfl: 5  ? ?Review of Systems:  ? ?ROS ?Negative unless otherwise specified per HPI. ?Vitals:  ? ?Vitals:  ? 02/15/22 0754  ?BP: 138/87  ?Pulse: (!) 108  ?Temp: 98.1 ?F (36.7 ?C)  ?TempSrc: Temporal  ?SpO2: 99%  ?Weight: 146 lb 9.6 oz (66.5 kg)  ?   ?Body mass index is 24.4 kg/m?. ? ?Physical Exam:   ? ?Physical Exam ?Vitals and nursing note reviewed.  ?Constitutional:   ?   General: Carla [name] is not in acute distress. ?   Appearance: Carla [name] is well-developed. Carla [name] is not ill-appearing or toxic-appearing.  ?Cardiovascular:  ?   Rate and Rhythm: Normal rate and regular rhythm.  ?   Pulses: Normal pulses.  ?   Heart sounds: Normal heart sounds, S1 normal and S2 normal.  ?Pulmonary:  ?   Effort: Pulmonary effort is normal.  ?   Breath sounds: Normal breath sounds.  ?Skin: ?   General: Skin is warm and dry.  ?Neurological:  ?   Mental Status: Carla [name] is alert.  ?   GCS: GCS eye subscore is 4. GCS verbal subscore is 5. GCS motor subscore is 6.  ?Psychiatric:     ?   Speech: Speech normal.     ?   Behavior: Behavior normal. Behavior is cooperative.  ? ? ?Assessment and Plan:  ? ?Cavernous hemangioma of liver ?Will refer to hepatology for further discussion of results  ? ?Left upper quadrant abdominal pain ?Update labs within a week (our lab is closed at the moment due to staffing) ?Informed Carla [name] to monitor BP during episodes and keep a log of recordings for future review ?Recommend RUQ u/s for further evaluation given acute on chronic symptoms ?If new/worsening symptoms or concerns occur, visit the ER immediately  ? ?I,Havlyn C Ratchford,acting as a scribe for [employer], PA.,have documented all relevant documentation on the behalf of [name], PA,as directed by  [name], PA while in the presence of [name], [place]. ? ?IInda Coke, PA, have reviewed all documentation for this visit. The documentation on 02/15/22 for the exam, diagnosis, procedures, and orders are all accurate and complete. ? ?[name], PA-C ? ?

## 2022-02-15 ENCOUNTER — Encounter: Payer: Self-pay | Admitting: Physician Assistant

## 2022-02-15 ENCOUNTER — Ambulatory Visit (INDEPENDENT_AMBULATORY_CARE_PROVIDER_SITE_OTHER): Payer: Medicaid Other | Admitting: Physician Assistant

## 2022-02-15 VITALS — BP 138/87 | HR 108 | Temp 98.1°F | Wt 146.6 lb

## 2022-02-15 DIAGNOSIS — D1803 Hemangioma of intra-abdominal structures: Secondary | ICD-10-CM

## 2022-02-15 DIAGNOSIS — R631 Polydipsia: Secondary | ICD-10-CM

## 2022-02-15 DIAGNOSIS — R350 Frequency of micturition: Secondary | ICD-10-CM

## 2022-02-15 DIAGNOSIS — R1012 Left upper quadrant pain: Secondary | ICD-10-CM

## 2022-02-15 LAB — POCT GLYCOSYLATED HEMOGLOBIN (HGB A1C): Hemoglobin A1C: 5.4 % (ref 4.0–5.6)

## 2022-02-15 NOTE — Patient Instructions (Signed)
It was great to see you! ? ?Please check your blood pressure during these episodes. ? ?I would like to refer you to a specialist to discuss your liver lesions to help [place] figure out next steps  ? ?We are ordering an ultrasound -- call [facility] to schedule [phone] ? ?Please schedule lab visit to update blood work next week ? ?If worsening symptoms, go to the ER ? ?Please call and consider transferring care to: ?[facility] ?[address] ?[place],  [place]  [address] ?Get Driving Directions ?Main: [phone] ? ?Take care, ? ?[name] PA-C  ?

## 2022-02-18 ENCOUNTER — Other Ambulatory Visit (INDEPENDENT_AMBULATORY_CARE_PROVIDER_SITE_OTHER): Payer: Self-pay

## 2022-02-18 DIAGNOSIS — R1012 Left upper quadrant pain: Secondary | ICD-10-CM

## 2022-02-18 DIAGNOSIS — D1803 Hemangioma of intra-abdominal structures: Secondary | ICD-10-CM

## 2022-02-18 LAB — CBC WITH DIFFERENTIAL/PLATELET
Basophils Absolute: 0 10*3/uL (ref 0.0–0.1)
Basophils Relative: 0.7 % (ref 0.0–3.0)
Eosinophils Absolute: 0.1 10*3/uL (ref 0.0–0.7)
Eosinophils Relative: 1.7 % (ref 0.0–5.0)
HCT: 41.2 % (ref 36.0–46.0)
Hemoglobin: 13.6 g/dL (ref 12.0–15.0)
Lymphocytes Relative: 36.4 % (ref 12.0–46.0)
Lymphs Abs: 2.3 10*3/uL (ref 0.7–4.0)
MCHC: 33 g/dL (ref 30.0–36.0)
MCV: 88.9 fl (ref 78.0–100.0)
Monocytes Absolute: 0.5 10*3/uL (ref 0.1–1.0)
Monocytes Relative: 8.1 % (ref 3.0–12.0)
Neutro Abs: 3.3 10*3/uL (ref 1.4–7.7)
Neutrophils Relative %: 53.1 % (ref 43.0–77.0)
Platelets: 400 10*3/uL (ref 150.0–400.0)
RBC: 4.63 Mil/uL (ref 3.87–5.11)
RDW: 13.5 % (ref 11.5–15.5)
WBC: 6.3 10*3/uL (ref 4.0–10.5)

## 2022-02-18 LAB — COMPREHENSIVE METABOLIC PANEL
ALT: 27 U/L (ref 0–35)
AST: 25 U/L (ref 0–37)
Albumin: 4.4 g/dL (ref 3.5–5.2)
Alkaline Phosphatase: 70 U/L (ref 39–117)
BUN: 14 mg/dL (ref 6–23)
CO2: 30 mEq/L (ref 19–32)
Calcium: 9.7 mg/dL (ref 8.4–10.5)
Chloride: 104 mEq/L (ref 96–112)
Creatinine, Ser: 0.89 mg/dL (ref 0.40–1.20)
GFR: 75.12 mL/min (ref >60.00)
Glucose, Bld: 101 mg/dL — ABNORMAL HIGH (ref 70–99)
Potassium: 3.9 mEq/L (ref 3.5–5.1)
Sodium: 141 mEq/L (ref 135–145)
Total Bilirubin: 0.6 mg/dL (ref 0.2–1.2)
Total Protein: 7.5 g/dL (ref 6.0–8.3)

## 2022-03-03 ENCOUNTER — Other Ambulatory Visit: Payer: Self-pay | Admitting: Physician Assistant

## 2022-03-11 ENCOUNTER — Emergency Department (HOSPITAL_COMMUNITY)
Admission: EM | Admit: 2022-03-11 | Discharge: 2022-03-12 | Disposition: A | Discharge status: Home / Self Care | Payer: Self-pay | Attending: Emergency Medicine | Admitting: Emergency Medicine

## 2022-03-11 ENCOUNTER — Emergency Department (HOSPITAL_COMMUNITY): Payer: Self-pay

## 2022-03-11 DIAGNOSIS — R0602 Shortness of breath: Secondary | ICD-10-CM | POA: Insufficient documentation

## 2022-03-11 DIAGNOSIS — I10 Essential (primary) hypertension: Secondary | ICD-10-CM | POA: Insufficient documentation

## 2022-03-11 DIAGNOSIS — H538 Other visual disturbances: Secondary | ICD-10-CM | POA: Insufficient documentation

## 2022-03-11 DIAGNOSIS — Z20822 Contact with and (suspected) exposure to covid-19: Secondary | ICD-10-CM | POA: Insufficient documentation

## 2022-03-11 DIAGNOSIS — M549 Dorsalgia, unspecified: Secondary | ICD-10-CM

## 2022-03-11 DIAGNOSIS — R140 Abdominal distension (gaseous): Secondary | ICD-10-CM | POA: Insufficient documentation

## 2022-03-11 DIAGNOSIS — Z7982 Long term (current) use of aspirin: Secondary | ICD-10-CM | POA: Insufficient documentation

## 2022-03-11 DIAGNOSIS — Z79899 Other long term (current) drug therapy: Secondary | ICD-10-CM | POA: Insufficient documentation

## 2022-03-11 DIAGNOSIS — Z8616 Personal history of COVID-19: Secondary | ICD-10-CM | POA: Insufficient documentation

## 2022-03-11 DIAGNOSIS — M546 Pain in thoracic spine: Secondary | ICD-10-CM | POA: Insufficient documentation

## 2022-03-11 DIAGNOSIS — M791 Myalgia, unspecified site: Secondary | ICD-10-CM | POA: Insufficient documentation

## 2022-03-11 LAB — COMPREHENSIVE METABOLIC PANEL
ALT: 25 U/L (ref 0–44)
AST: 23 U/L (ref 15–41)
Albumin: 3.9 g/dL (ref 3.5–5.0)
Alkaline Phosphatase: 74 U/L (ref 38–126)
Anion gap: 6 (ref 5–15)
BUN: 13 mg/dL (ref 6–20)
CO2: 29 mmol/L (ref 22–32)
Calcium: 9.6 mg/dL (ref 8.9–10.3)
Chloride: 107 mmol/L (ref 98–111)
Creatinine, Ser: 0.95 mg/dL (ref 0.44–1.00)
GFR, Estimated: >60 mL/min (ref >60)
Glucose, Bld: 107 mg/dL — ABNORMAL HIGH (ref 70–99)
Potassium: 4.1 mmol/L (ref 3.5–5.1)
Sodium: 142 mmol/L (ref 135–145)
Total Bilirubin: 0.6 mg/dL (ref 0.3–1.2)
Total Protein: 7.4 g/dL (ref 6.5–8.1)

## 2022-03-11 LAB — LIPASE, BLOOD: Lipase: 49 U/L (ref 11–51)

## 2022-03-11 LAB — RESP PANEL BY RT-PCR (FLU A&B, COVID) ARPGX2
Influenza A by PCR: NEGATIVE
Influenza B by PCR: NEGATIVE
SARS Coronavirus 2 by RT PCR: NEGATIVE

## 2022-03-11 LAB — CBC WITH DIFFERENTIAL/PLATELET
Abs Immature Granulocytes: 0.02 10*3/uL (ref 0.00–0.07)
Basophils Absolute: 0 10*3/uL (ref 0.0–0.1)
Basophils Relative: 1 %
Eosinophils Absolute: 0.1 10*3/uL (ref 0.0–0.5)
Eosinophils Relative: 1 %
HCT: 41.2 % (ref 36.0–46.0)
Hemoglobin: 13.1 g/dL (ref 12.0–15.0)
Immature Granulocytes: 0 %
Lymphocytes Relative: 35 %
Lymphs Abs: 2.7 10*3/uL (ref 0.7–4.0)
MCH: 28.9 pg (ref 26.0–34.0)
MCHC: 31.8 g/dL (ref 30.0–36.0)
MCV: 90.7 fL (ref 80.0–100.0)
Monocytes Absolute: 0.5 10*3/uL (ref 0.1–1.0)
Monocytes Relative: 7 %
Neutro Abs: 4.3 10*3/uL (ref 1.7–7.7)
Neutrophils Relative %: 56 %
Platelets: 422 10*3/uL — ABNORMAL HIGH (ref 150–400)
RBC: 4.54 MIL/uL (ref 3.87–5.11)
RDW: 12.6 % (ref 11.5–15.5)
WBC: 7.6 10*3/uL (ref 4.0–10.5)
nRBC: 0 % (ref 0.0–0.2)

## 2022-03-11 LAB — TROPONIN I (HIGH SENSITIVITY)
Troponin I (High Sensitivity): 6 ng/L (ref <18)
Troponin I (High Sensitivity): 7 ng/L (ref <18)

## 2022-03-11 NOTE — ED Triage Notes (Signed)
Pt. Stated, Carla [name] had some pain on the left upper side of my abdomin, some eye vision problem sometimes I cant see. Started 3 days ago. ?

## 2022-03-11 NOTE — ED Provider Triage Note (Signed)
Emergency Medicine Provider Triage Evaluation Note ? ?Carla [name] , a 51 y.o. female  was evaluated in triage.  Pt complains of gradual onset, constant, pleuritic type pain to L mid back. Pt is concerned it is her "lungs." She also complains of fatigue, body aches, fever, cough, and abdominal swelling. Reports she had COVID in February. Denies recent sick contacts. ? ?Review of Systems  ?Positive: + back pain, fever, body aches, cough, abd swelling ?Negative: - abd pain, nausea, vomiting, diarrhea ? ?Physical Exam  ?BP (!) 148/86   Pulse (!) 105   Temp 98.9 ?F (37.2 ?C)   Resp 16   LMP 01/16/2020   SpO2 99%  ?Gen:   Awake, no distress   ?Resp:  Normal effort  ?MSK:   Moves extremities without difficulty  ?Other:   ? ?Medical Decision Making  ?Medically screening exam initiated at 3:29 PM.  Appropriate orders placed.  Carla [name] was informed that the remainder of the evaluation will be completed by another provider, this initial triage assessment does not replace that evaluation, and the importance of remaining in the ED until their evaluation is complete. ? ?  ?Eustaquio Maize, PA-C ?03/11/22 1531 ? ?

## 2022-03-12 ENCOUNTER — Ambulatory Visit: Payer: Medicaid Other | Admitting: Physician Assistant

## 2022-03-12 LAB — D-DIMER, QUANTITATIVE: D-Dimer, Quant: 0.3 ug/mL-FEU (ref 0.00–0.50)

## 2022-03-12 MED ORDER — KETOROLAC TROMETHAMINE 15 MG/ML IJ SOLN
15.0000 mg | Freq: Once | INTRAMUSCULAR | Status: AC
  Administered 2022-03-12: 15 mg via INTRAVENOUS
  Filled 2022-03-12: qty 1

## 2022-03-12 NOTE — Discharge Instructions (Signed)
You may use over-the-counter Motrin (Ibuprofen), Acetaminophen (Tylenol), topical muscle creams such as SalonPas, Icy Hot, Bengay, etc. Please stretch, apply ice or heat (whichever helps), and have massage therapy for additional assistance.  

## 2022-03-12 NOTE — ED Provider Notes (Signed)
?[facility] ?Provider Note ? ?CSN: [id number] ?Arrival date & time: 03/11/22 1510 ? ?Chief Complaint(s) ?Abdominal Pain, Nausea, Eye Problem, and Shortness of Breath ? ?HPI ?Carla [name] is a 51 y.o. female with a past medical history listed below who presents to the emergency department with 1 week of left mid back pain.  Intermittent in nature.  Worse with deep breathing.  Endorsing shortness of breath due to the pain.  No coughing or congestion.  No nausea or vomiting.  Patient denies any abdominal pain but reports that her right abdomen feels bloated.  Reports that since having COVID-19 infection 1 to 2 months ago she has been generally fatigued and has had intermittent myalgias.  She also reports having intermittent blurry vision.  Currently denies any visual disturbance.  No chest pain.  No urinary symptoms.  No diarrhea. ? ? ?The history is provided by the patient.  ? ?Past Medical History ?Past Medical History:  ?Diagnosis Date  ? COVID-19   ? Depression   ? Frequent headaches   ? 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 Hyperlipidemia   ? Hypertension   ? Stroke [facility])   ? 09/2015  ? Vertigo   ? ?Patient Active Problem List  ? Diagnosis Date Noted  ? Essential hypertension 07/05/2019  ? Left knee pain 01/08/2018  ? 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 Premature ventricular contractions 03/25/2016  ? Hyperlipidemia 03/25/2016  ? Benign paroxysmal positional vertigo 03/11/2016  ? Neck pain on right side 02/19/2016  ? Cryptogenic stroke ([place]) 12/28/2015  ? Multiple hemangiomas   ? Cerebral infarction due to unspecified mechanism   ? Cavernous hemangioma of liver 10/11/2015  ? Migraines 10/11/2015  ? ?Home Medication(s) ?Prior to Admission medications   ?Medication Sig Start Date End Date Taking? Authorizing Provider  ?amitriptyline (ELAVIL) 25 MG tablet Take 1 tablet (25 mg total) by mouth at bedtime. 04/16/21  Yes Frann Rider, NP  ?ASPIRIN LOW DOSE 81 MG EC tablet Take 81 mg by mouth daily.  04/08/20  Yes [provider]  ?meclizine (ANTIVERT) 25 MG tablet Take 1 tablet (25 mg total) by mouth 2 (two) times daily as needed for dizziness. 10/17/21  Yes McCue, Janett Billow, NP  ?metoprolol tartrate (LOPRESSOR) 25 MG tablet TAKE 1/2 TABLET POR VIA ORAL DOS VECES AL DIA ?Patient taking differently: Take 12.5 mg by mouth 2 (two) times daily. 03/04/22  Yes [name], PA  ?triamcinolone cream (KENALOG) 0.1 % Apply to affected area 1-2 times daily ?Patient taking differently: Apply 1 application. topically daily as needed (Rash). 10/17/21  Yes Frann Rider, NP  ?fenofibrate (TRICOR) 145 MG tablet Take 1 tablet (145 mg total) by mouth daily. ?Patient not taking: Reported on 03/11/2022 10/12/21   [name], [place]  ?                                                                                                                                  ?  Allergies ?Pravastatin and Tramadol ? ?Review of Systems ?Review of Systems ?As noted in HPI ? ?Physical Exam ?Vital Signs  ?I have reviewed the triage vital signs ?BP 134/77   Pulse (!) 57   Temp 98.9 ?F (37.2 ?C)   Resp 18   LMP 01/16/2020   SpO2 100%  ? ?Physical Exam ?Vitals reviewed.  ?Constitutional:   ?   General: She is not in acute distress. ?   Appearance: She is well-developed. She is not diaphoretic.  ?HENT:  ?   Head: Normocephalic and atraumatic.  ?   Nose: Nose normal.  ?Eyes:  ?   General: No scleral icterus.    ?   Right eye: No discharge.     ?   Left eye: No discharge.  ?   Conjunctiva/sclera: Conjunctivae normal.  ?   Pupils: Pupils are equal, round, and reactive to light.  ?Cardiovascular:  ?   Rate and Rhythm: Normal rate and regular rhythm.  ?   Heart sounds: No murmur heard. ?  No friction rub. No gallop.  ?Pulmonary:  ?   Effort: Pulmonary effort is normal. No respiratory distress.  ?   Breath sounds: Normal breath sounds. No stridor. No rales.  ?Abdominal:  ?   General: There is no distension.  ?   Palpations: Abdomen is soft.  ?    Tenderness: There is no abdominal tenderness.  ?Musculoskeletal:     ?   General: No tenderness.  ?   Cervical back: Normal range of motion and neck supple.  ?Skin: ?   General: Skin is warm and dry.  ?   Findings: No erythema or rash.  ?Neurological:  ?   Mental Status: She is alert and oriented to person, place, and time.  ? ? ?ED Results and Treatments ?Labs ?(all labs ordered are listed, but only abnormal results are displayed) ?Labs Reviewed  ?CBC WITH DIFFERENTIAL/PLATELET - Abnormal; Notable for the following components:  ?    Result Value  ? Platelets 422 (*)   ? All other components within normal limits  ?COMPREHENSIVE METABOLIC PANEL - Abnormal; Notable for the following components:  ? Glucose, Bld 107 (*)   ? All other components within normal limits  ?RESP PANEL BY RT-PCR (FLU A&B, COVID) ARPGX2  ?LIPASE, BLOOD  ?D-DIMER, QUANTITATIVE  ?TROPONIN I (HIGH SENSITIVITY)  ?TROPONIN I (HIGH SENSITIVITY)  ?                                                                                                                       ?EKG ? EKG Interpretation ? ?Date/Time:  Monday March 11 2022 15:19:33 EDT ?Ventricular Rate:  103 ?PR Interval:  148 ?QRS Duration: 84 ?QT Interval:  324 ?QTC Calculation: 424 ?R Axis:   60 ?Text Interpretation: Sinus tachycardia Otherwise normal ECG When compared with ECG of 06-Apr-2021 21:04, PREVIOUS ECG IS PRESENT Confirmed by Addison Lank [phone]) on 03/12/2022 2:00:08 AM ?  ? ?  ? ?Radiology ?DG Chest 2  View ? ?Result Date: 03/11/2022 ?CLINICAL DATA:  Chest pain EXAM: CHEST - 2 VIEW COMPARISON:  Chest radiograph dated Apr 06, 2021 FINDINGS: The heart size and mediastinal contours are within normal limits. Both lungs are clear. The visualized skeletal structures are unremarkable. IMPRESSION: No active cardiopulmonary disease. Electronically Signed   By: Keane Police D.O.   On: 03/11/2022 15:55   ? ?Pertinent labs & imaging results that were available during my care of the patient were  reviewed by me and considered in my medical decision making (see MDM for details). ? ?Medications Ordered in ED ?Medications  ?ketorolac (TORADOL) 15 MG/ML injection 15 mg (15 mg Intravenous Given 03/12/22 0218)  ?                                                               ?                                                                    ?Procedures ?Procedures ? ?(including critical care time) ? ?Medical Decision Making / ED Course ? ? ? Complexity of Problem: ? ?Patient's presenting problem/concern, DDX, and MDM listed below: ?Left mid back pain ?No urinary symptoms or CVA tenderness concerning for UTI/pyelonephritis ?We will assess for pneumonia, pneumothorax, PE, atypical ACS.  Pancreatitis.  Biliary disease. ? ?Hospitalization Considered:  ?Yes ? ?Initial Intervention:  ?IV Toradol ? ?  Complexity of Data: ?  ?Cardiac Monitoring: ?EKG without acute ischemic changes or evidence of pericarditis.  Notable for sinus tachycardia. ? ?Laboratory Tests ordered listed below with my independent interpretation: ?CBC without leukocytosis or anemia. ?No significant electrolyte derangements or renal sufficiency.  No evidence of bili obstruction or pancreatitis. ?Serial troponins negative x2 - heart score less than 3.  Effectively ruling out ACS and is atypical presentation. ?Dimer negative, ruling out PE. ?  ?Imaging Studies ordered listed below with my independent interpretation: ?Chest x-ray without evidence of pneumonia, pneumothorax, pulmonary edema. ?  ?  ?ED Course:   ? ?Assessment, Add'l Intervention, and Reassessment: ?Left mid back pain ?Ruled out for the above.  ?Possible deep musculature etiology. ?Improved with Toradol ? ?Final Clinical Impression(s) / ED Diagnoses ?Final diagnoses:  ?Mid back pain on left side  ? ?The patient appears reasonably screened and/or stabilized for discharge and I doubt any other medical condition or other [facility] requiring further screening, evaluation, or treatment in the ED at this  time prior to discharge. Safe for discharge with strict return precautions. ? ?Disposition: Discharge ? ?Condition: Good ? ?I have discussed the results, Dx and Tx plan with the patient/family who expressed

## 2022-03-23 ENCOUNTER — Other Ambulatory Visit: Payer: Self-pay | Admitting: Adult Health

## 2022-04-10 ENCOUNTER — Telehealth: Payer: Self-pay | Admitting: Adult Health

## 2022-04-10 MED ORDER — MECLIZINE HCL 25 MG PO TABS: 25.0000 mg | ORAL_TABLET | Freq: Two times a day (BID) | ORAL | 5 refills | Status: DC | PRN

## 2022-04-10 NOTE — Telephone Encounter (Signed)
Pt is requesting a refill for meclizine (ANTIVERT) 25 MG tablet . ? ?Pharmacy:  [id number] ? ?

## 2022-04-10 NOTE — Telephone Encounter (Signed)
Refill has been submitted.

## 2022-06-17 ENCOUNTER — Emergency Department (HOSPITAL_COMMUNITY)
Admission: EM | Admit: 2022-06-17 | Discharge: 2022-06-18 | Disposition: A | Discharge status: Home / Self Care | Payer: Self-pay | Attending: Emergency Medicine | Admitting: Emergency Medicine

## 2022-06-17 ENCOUNTER — Encounter (HOSPITAL_COMMUNITY): Payer: Self-pay

## 2022-06-17 ENCOUNTER — Emergency Department (HOSPITAL_COMMUNITY): Payer: Self-pay

## 2022-06-17 DIAGNOSIS — R112 Nausea with vomiting, unspecified: Secondary | ICD-10-CM | POA: Insufficient documentation

## 2022-06-17 DIAGNOSIS — Z79899 Other long term (current) drug therapy: Secondary | ICD-10-CM | POA: Insufficient documentation

## 2022-06-17 DIAGNOSIS — R1013 Epigastric pain: Secondary | ICD-10-CM | POA: Insufficient documentation

## 2022-06-17 DIAGNOSIS — R1011 Right upper quadrant pain: Secondary | ICD-10-CM | POA: Insufficient documentation

## 2022-06-17 DIAGNOSIS — H538 Other visual disturbances: Secondary | ICD-10-CM | POA: Insufficient documentation

## 2022-06-17 LAB — COMPREHENSIVE METABOLIC PANEL
ALT: 32 U/L (ref 0–44)
AST: 27 U/L (ref 15–41)
Albumin: 4.1 g/dL (ref 3.5–5.0)
Alkaline Phosphatase: 77 U/L (ref 38–126)
Anion gap: 6 (ref 5–15)
BUN: 6 mg/dL (ref 6–20)
CO2: 28 mmol/L (ref 22–32)
Calcium: 9.6 mg/dL (ref 8.9–10.3)
Chloride: 108 mmol/L (ref 98–111)
Creatinine, Ser: 0.95 mg/dL (ref 0.44–1.00)
GFR, Estimated: >60 mL/min (ref >60)
Glucose, Bld: 111 mg/dL — ABNORMAL HIGH (ref 70–99)
Potassium: 3.9 mmol/L (ref 3.5–5.1)
Sodium: 142 mmol/L (ref 135–145)
Total Bilirubin: 0.4 mg/dL (ref 0.3–1.2)
Total Protein: 7.4 g/dL (ref 6.5–8.1)

## 2022-06-17 LAB — LIPASE, BLOOD: Lipase: 36 U/L (ref 11–51)

## 2022-06-17 LAB — CBC
HCT: 41.4 % (ref 36.0–46.0)
Hemoglobin: 13.4 g/dL (ref 12.0–15.0)
MCH: 29.5 pg (ref 26.0–34.0)
MCHC: 32.4 g/dL (ref 30.0–36.0)
MCV: 91.2 fL (ref 80.0–100.0)
Platelets: 386 10*3/uL (ref 150–400)
RBC: 4.54 MIL/uL (ref 3.87–5.11)
RDW: 12.6 % (ref 11.5–15.5)
WBC: 8.3 10*3/uL (ref 4.0–10.5)
nRBC: 0 % (ref 0.0–0.2)

## 2022-06-17 LAB — I-STAT BETA HCG BLOOD, ED (MC, WL, AP ONLY): I-stat hCG, quantitative: <5 m[IU]/mL (ref <5)

## 2022-06-17 LAB — URINALYSIS, ROUTINE W REFLEX MICROSCOPIC
Bilirubin Urine: NEGATIVE
Glucose, UA: NEGATIVE mg/dL
Hgb urine dipstick: NEGATIVE
Ketones, ur: NEGATIVE mg/dL
Leukocytes,Ua: NEGATIVE
Nitrite: NEGATIVE
Protein, ur: NEGATIVE mg/dL
Specific Gravity, Urine: 1.004 — ABNORMAL LOW (ref 1.005–1.030)
pH: 8 (ref 5.0–8.0)

## 2022-06-17 NOTE — ED Provider Triage Note (Signed)
Emergency Medicine Provider Triage Evaluation Note  Carla [name] , a 51 y.o. female  was evaluated in triage.  Pt complains of right upper quadrant abdominal pain for the past month.  She has had intermittent nausea and vomiting.  Pain has been persistent for the past 24 hours.  Patient also had had intermittent blurry vision going on for about a month and a half.  Patient has a history of cryptogenic strokes.  No other new neurologic complaints..  Review of Systems  Positive: Right upper quadrant pain Negative: Fever chills  Physical Exam  BP 137/88 (BP Location: Left Arm)   Pulse 91   Temp 98.4 F (36.9 C) (Oral)   Resp 16   Ht '5\' 5"'$  (1.651 m)   Wt 66 kg   LMP 01/16/2020   SpO2 99%   BMI 24.21 kg/m  Gen:   Awake, no distress   Resp:  Normal effort  MSK:   Moves extremities without difficulty  Other:  [place]  Medically screening exam initiated at 3:18 PM.  Appropriate orders placed.  Matty Deamer was informed that the remainder of the evaluation will be completed by another provider, this initial triage assessment does not replace that evaluation, and the importance of remaining in the ED until their evaluation is complete.  Work up initiated   Margarita Mail, [place] 06/17/22 2157

## 2022-06-17 NOTE — ED Triage Notes (Signed)
Pt arrives POV for eval of RUQ abd pain x 1 month. Pt reports N/V in the last 24 hours, pain is more severe in nature. Reports loss of appetite. Pt also reports vision changes to R eye x 1.5 months. States intermittent in nature, blurry and "different" from L side.

## 2022-06-18 ENCOUNTER — Other Ambulatory Visit: Payer: Self-pay

## 2022-06-18 MED ORDER — PANTOPRAZOLE SODIUM 40 MG PO TBEC: 40.0000 mg | DELAYED_RELEASE_TABLET | Freq: Every day | ORAL | 3 refills | Status: DC

## 2022-06-18 MED ORDER — DICYCLOMINE HCL 20 MG PO TABS: 20.0000 mg | ORAL_TABLET | Freq: Three times a day (TID) | ORAL | 0 refills | Status: DC

## 2022-06-18 NOTE — ED Notes (Signed)
This spanish speaking rn discussed discharge instructions and prescription with pt. Pt verbalized understanding with no questions at this time. Pt to call family for ride home.

## 2022-06-18 NOTE — ED Provider Notes (Signed)
[facility] EMERGENCY DEPARTMENT Provider Note   CSN: [id number] Arrival date & time: 06/17/22  1213     History  Chief Complaint  Patient presents with   Abdominal Pain   Vision Changes    Carla [name] is a 51 y.o. female.  Patient presents with several complaints.  Patient reports that she has been having intermittent right-sided upper abdominal pain for 2 weeks.  It has been worse for the last 2 days.  This morning she had onset of nausea and vomiting.  No associated chest pain or shortness of breath.  Patient also reports that she has noticed blurred vision.  This has been ongoing for 1-1/2 months.       Home Medications Prior to Admission medications   Medication Sig Start Date End Date Taking? Authorizing Provider  dicyclomine (BENTYL) 20 MG tablet Take 1 tablet (20 mg total) by mouth 3 (three) times daily before meals. 06/18/22  Yes Antaeus Karel, Gwenyth Allegra, MD  pantoprazole (PROTONIX) 40 MG tablet Take 1 tablet (40 mg total) by mouth daily. 06/18/22  Yes Keiyana Stehr, Gwenyth Allegra, MD  amitriptyline (ELAVIL) 25 MG tablet Take 1 tablet (25 mg total) by mouth at bedtime. 04/16/21   Frann Rider, NP  ASPIRIN LOW DOSE 81 MG EC tablet Take 81 mg by mouth daily. 04/08/20   [provider]  fenofibrate (TRICOR) 145 MG tablet Take 1 tablet (145 mg total) by mouth daily. Patient not taking: Reported on 03/11/2022 10/12/21   [name], PA  meclizine (ANTIVERT) 25 MG tablet Take 1 tablet (25 mg total) by mouth 2 (two) times daily as needed for dizziness. 04/10/22   Frann Rider, NP  metoprolol tartrate (LOPRESSOR) 25 MG tablet TAKE 1/2 TABLET POR VIA ORAL DOS VECES AL DIA Patient taking differently: Take 12.5 mg by mouth 2 (two) times daily. 03/04/22   [name], PA  triamcinolone cream (KENALOG) 0.1 % Apply to affected area 1-2 times daily Patient taking differently: Apply 1 application. topically daily as needed (Rash). 10/17/21   Frann Rider,  NP      Allergies    Pravastatin and Tramadol    Review of Systems   Review of Systems  Physical Exam Updated Vital Signs BP (!) 152/90   Pulse 71   Temp 98.2 F (36.8 C)   Resp 17   Ht '5\' 5"'$  (1.651 m)   Wt 66 kg   LMP 01/16/2020   SpO2 100%   BMI 24.21 kg/m  Physical Exam Vitals and nursing note reviewed.  Constitutional:      General: She is not in acute distress.    Appearance: She is well-developed.  HENT:     Head: Normocephalic and atraumatic.     Mouth/Throat:     Mouth: Mucous membranes are moist.  Eyes:     General: Vision grossly intact. Gaze aligned appropriately.     Extraocular Movements: Extraocular movements intact.     Conjunctiva/sclera: Conjunctivae normal.  Cardiovascular:     Rate and Rhythm: Normal rate and regular rhythm.     Pulses: Normal pulses.     Heart sounds: Normal heart sounds, S1 normal and S2 normal. No murmur heard.    No friction rub. No gallop.  Pulmonary:     Effort: Pulmonary effort is normal. No respiratory distress.     Breath sounds: Normal breath sounds.  Abdominal:     General: Bowel sounds are normal.     Palpations: Abdomen is soft.     Tenderness:  There is abdominal tenderness in the right upper quadrant and epigastric area. There is no guarding or rebound.     Hernia: No hernia is present.  Musculoskeletal:        General: No swelling.     Cervical back: Full passive range of motion without pain, normal range of motion and neck supple. No spinous process tenderness or muscular tenderness. Normal range of motion.     Right lower leg: No edema.     Left lower leg: No edema.  Skin:    General: Skin is warm and dry.     Capillary Refill: Capillary refill takes less than 2 seconds.     Findings: No ecchymosis, erythema, rash or wound.  Neurological:     General: No focal deficit present.     Mental Status: She is alert and oriented to person, place, and time.     GCS: GCS eye subscore is 4. GCS verbal subscore is 5.  GCS motor subscore is 6.     Cranial Nerves: Cranial nerves 2-12 are intact.     Sensory: Sensation is intact.     Motor: Motor function is intact.     Coordination: Coordination is intact.  Psychiatric:        Attention and Perception: Attention normal.        Mood and Affect: Mood normal.        Speech: Speech normal.        Behavior: Behavior normal.     ED Results / Procedures / Treatments   Labs (all labs ordered are listed, but only abnormal results are displayed) Labs Reviewed  COMPREHENSIVE METABOLIC PANEL - Abnormal; Notable for the following components:      Result Value   Glucose, Bld 111 (*)    All other components within normal limits  URINALYSIS, ROUTINE W REFLEX MICROSCOPIC - Abnormal; Notable for the following components:   Color, Urine STRAW (*)    Specific Gravity, Urine 1.004 (*)    All other components within normal limits  LIPASE, BLOOD  CBC  I-STAT BETA HCG BLOOD, ED ([facility], [facility], [facility] ONLY)    EKG None  Radiology CT HEAD WO CONTRAST (5MM)  Result Date: 06/17/2022 CLINICAL DATA:  Neuro deficit, persistent/recurrent headache. EXAM: CT HEAD WITHOUT CONTRAST TECHNIQUE: Contiguous axial images were obtained from the base of the skull through the vertex without intravenous contrast. RADIATION DOSE REDUCTION: This exam was performed according to the departmental dose-optimization program which includes automated exposure control, adjustment of the mA and/or kV according to patient size and/or use of iterative reconstruction technique. COMPARISON:  CT head dated July 11, 2021 FINDINGS: Brain: No evidence of acute infarction, hemorrhage, hydrocephalus, extra-axial collection or mass lesion/mass effect. Vascular: No hyperdense vessel or unexpected calcification. Skull: Normal. Negative for fracture or focal lesion. Sinuses/Orbits: No acute finding. Other: None. IMPRESSION: Normal unenhanced CT examination of the head. Electronically Signed   By: Keane Police D.O.   On:  06/17/2022 17:18   US Abdomen Limited RUQ (LIVER/GB)  Result Date: 06/17/2022 CLINICAL DATA:  [id number].  Abdominal pain.  History of Hem angioma EXAM: ULTRASOUND ABDOMEN LIMITED RIGHT UPPER QUADRANT COMPARISON:  CT abdomen pelvis 08/20/2021 FINDINGS: Gallbladder: No gallstones or wall thickening visualized. No sonographic Murphy sign noted by sonographer. Common bile duct: Diameter: 5 mm. Liver: Redemonstration of a couple of hyperechoic mass lesions consistent with known hepatic hemangiomas: 5.4 x 4.9 x 4.4 cm and 4.8 x 6.4 x 7.2 cm. Within normal limits in parenchymal echogenicity.  Portal vein is patent on color Doppler imaging with normal direction of blood flow towards the liver. Other: None. IMPRESSION: Known hepatic hemangiomas with otherwise unremarkable right upper quadrant ultrasound. Electronically Signed   By: Iven Finn M.D.   On: 06/17/2022 16:53    Procedures Procedures    Medications Ordered in ED Medications - No data to display  ED Course/ Medical Decision Making/ A&P                           Medical Decision Making Amount and/or Complexity of Data Reviewed Labs: ordered.   Presents with right upper abdominal pain that has been ongoing for 2 weeks.  Differential diagnosis considered includes cholecystitis, cholelithiasis, gastritis, GERD, pancreatitis.  Patient does indicate some costal margin pain and tenderness as well, no rash, doubt shingles.  Chest wall pain is a possibility.  Additionally, patient complains of intermittent blurred vision.  She does have a history of migraines but has not had significant headaches with this.  Symptoms do resolve.  CT of head is unremarkable.  No neurologic findings on exam.  Recommend she follow-up with an eye doctor and then follow-up with her neurologist.  Abdominal pain work-up is negative.  Labs are unremarkable.  Urinalysis no signs of infection.  Doubt kidney stone.  Ultrasound of right upper quadrant shows a normal  gallbladder, no acute pathology.        Final Clinical Impression(s) / ED Diagnoses Final diagnoses:  Right upper quadrant abdominal pain  Blurred vision    Rx / DC Orders ED Discharge Orders          Ordered    pantoprazole (PROTONIX) 40 MG tablet  Daily        06/18/22 0427    dicyclomine (BENTYL) 20 MG tablet  3 times daily before meals        06/18/22 0427              Orpah Greek, MD 06/18/22 [phone]

## 2022-06-21 ENCOUNTER — Other Ambulatory Visit: Payer: Self-pay | Admitting: Adult Health

## 2022-07-04 ENCOUNTER — Telehealth: Payer: Self-pay | Admitting: Physician Assistant

## 2022-07-04 NOTE — Telephone Encounter (Signed)
Patient states: -Back in March a stat US was ordered for her by PCP but she was unable to have it done due to financial issues  - She is now financially able to afford the imaging tests but upon calling radiology they stated they could not complete this due to needing new orders.    Patient requests:  - A new order be placed so she can have this completed

## 2022-07-04 NOTE — Telephone Encounter (Signed)
Left message on voicemail to call office.  

## 2022-07-04 NOTE — Telephone Encounter (Signed)
Please see message and advise 

## 2022-07-10 NOTE — Telephone Encounter (Signed)
Left message on voicemail to call office.  

## 2022-08-07 ENCOUNTER — Encounter: Payer: Self-pay | Admitting: Physician Assistant

## 2022-08-07 ENCOUNTER — Ambulatory Visit (INDEPENDENT_AMBULATORY_CARE_PROVIDER_SITE_OTHER): Payer: Self-pay | Admitting: Physician Assistant

## 2022-08-07 VITALS — BP 128/88 | HR 98 | Temp 98.8°F | Ht 65.0 in | Wt 145.0 lb

## 2022-08-07 DIAGNOSIS — I10 Essential (primary) hypertension: Secondary | ICD-10-CM

## 2022-08-07 DIAGNOSIS — R42 Dizziness and giddiness: Secondary | ICD-10-CM

## 2022-08-07 DIAGNOSIS — E785 Hyperlipidemia, unspecified: Secondary | ICD-10-CM

## 2022-08-07 LAB — HEMOGLOBIN A1C: Hgb A1c MFr Bld: 5.7 % (ref 4.6–6.5)

## 2022-08-07 LAB — LIPID PANEL
Cholesterol: 198 mg/dL (ref 0–200)
HDL: 54.3 mg/dL (ref >39.00)
LDL Cholesterol: 121 mg/dL — ABNORMAL HIGH (ref 0–99)
NonHDL: 143.55
Total CHOL/HDL Ratio: 4
Triglycerides: 114 mg/dL (ref 0.0–149.0)
VLDL: 22.8 mg/dL (ref 0.0–40.0)

## 2022-08-07 MED ORDER — FENOFIBRATE 145 MG PO TABS: 145.0000 mg | ORAL_TABLET | Freq: Every day | ORAL | 1 refills | Status: DC

## 2022-08-07 NOTE — Progress Notes (Signed)
Carla [name] is a 51 y.o. female here for a follow up of a pre-existing problem.  History of Present Illness:   Chief Complaint  Patient presents with   Hypertension   Hyperlipidemia   Panic Attack    Pt c/o panic attacks for the past month, almost everyday in the morning.   She is here with in-person interpreter, Lorn Junes.  HPI  HTN Currently not taking metoprolol 12.5 mg BID. At home blood pressure readings are: regularly checked and overall around 120/80 per patient. Patient denies chest pain, SOB, blurred vision, dizziness, unusual headaches, lower leg swelling. Patient is mostly compliant with medication. Denies excessive caffeine intake, stimulant usage, excessive alcohol intake, or increase in salt consumption.  BP Readings from Last 3 Encounters:  08/07/22 128/88  06/18/22 134/86  03/12/22 109/82    Vertigo She has an rx for meclizine. She states that she ends up taking this pretty much daily. It does make her sleepy. When she feels unbalanced or as if she has vertigo, this gives a sense of anxiety and panic. She experienced this over the weekend. On Sunday was at home and did not work. Took her medications as she was supposed to. She started to feel bad and started checking her BP. She was trembling. Had some water and chocolate and she felt better. She is wondering if maybe her blood sugar is elevated.  Denies weakness on one side of the body, slurred speech, confusion  HLD She is unable to tell me if she is taking her tricor regularly.  She is currently prescribed Tricor 145 mg daily.  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   Hyperlipidemia    Hypertension    Stroke ([place])    09/2015   Vertigo      Social History   Tobacco Use   Smoking status: Never   Smokeless tobacco: Never  Vaping Use   Vaping Use: Never used  Substance Use Topics   Alcohol  use: No   Drug use: No    Past Surgical History:  Procedure Laterality Date   RIGHT ARM SURGERY     TEE WITHOUT CARDIOVERSION N/A 10/13/2015   Procedure: TRANSESOPHAGEAL ECHOCARDIOGRAM (TEE);  Surgeon: Lelon Perla, MD;  Location: [facility] ENDOSCOPY;  Service: Cardiovascular;  Laterality: N/A;    Family History  Problem Relation Age of Onset   Leukemia Mother    Colon cancer Brother    Asthma Neg Hx    Cancer Neg Hx    Diabetes Neg Hx    Hyperlipidemia Neg Hx    Heart failure Neg Hx    Hypertension Neg Hx    Migraines Neg Hx    Rashes / Skin problems Neg Hx    Seizures Neg Hx    Stroke Neg Hx    Thyroid disease Neg Hx     Allergies  Allergen Reactions   Pravastatin Anxiety   Tramadol Nausea Only    Current Medications:   Current Outpatient Medications:    amitriptyline (ELAVIL) 25 MG tablet, TOME UNA TABLETA TODOS LOS DIAS AL ACOSTARSE, Disp: 90 tablet, Rfl: 3   aspirin EC 81 MG tablet, Take 81 mg by mouth daily. Swallow whole., Disp: , Rfl:    fenofibrate (TRICOR) 145 MG tablet, Take 1 tablet (145 mg total) by mouth daily., Disp: 90 tablet, Rfl: 1  meclizine (ANTIVERT) 25 MG tablet, Take 1 tablet (25 mg total) by mouth 2 (two) times daily as needed for dizziness., Disp: 60 tablet, Rfl: 5   triamcinolone cream (KENALOG) 0.1 %, Apply to affected area 1-2 times daily (Patient taking differently: Apply 1 application  topically daily as needed (Rash).), Disp: 30 g, Rfl: 5   metoprolol tartrate (LOPRESSOR) 25 MG tablet, TAKE 1/2 TABLET POR VIA ORAL DOS VECES AL DIA (Patient not taking: Reported on 08/07/2022), Disp: 90 tablet, Rfl: 1   Review of Systems:   ROS Negative unless otherwise specified per HPI.  Vitals:   Vitals:   08/07/22 1309  BP: 128/88  Pulse: 98  Temp: 98.8 F (37.1 C)  TempSrc: Temporal  SpO2: 96%  Weight: 145 lb (65.8 kg)  Height: '5\' 5"'$  (1.651 m)     Body mass index is 24.13 kg/m.  Physical Exam:   Physical Exam Vitals and nursing note  reviewed.  Constitutional:      General: She is not in acute distress.    Appearance: She is well-developed. She is not ill-appearing or toxic-appearing.  Cardiovascular:     Rate and Rhythm: Normal rate and regular rhythm.     Pulses: Normal pulses.     Heart sounds: Normal heart sounds, S1 normal and S2 normal.  Pulmonary:     Effort: Pulmonary effort is normal.     Breath sounds: Normal breath sounds.  Skin:    General: Skin is warm and dry.  Neurological:     General: No focal deficit present.     Mental Status: She is alert.     GCS: GCS eye subscore is 4. GCS verbal subscore is 5. GCS motor subscore is 6.  Psychiatric:        Speech: Speech normal.        Behavior: Behavior normal. Behavior is cooperative.     Assessment and Plan:   Essential hypertension Normotensive Discussed need to continue metoprolol 12.5 mg bid I recommended that she follow-up with her pharmacy about the specific manufacturer of this, she notes that her current has pink pills and her prior was white Continue to monitor blood pressures at home  Hyperlipidemia, unspecified hyperlipidemia type Update lipid panel today She is following up with neurology soon in November Recommend aspirin 81 mg daily May need to adjust Tricor 145 mg daily She continues to report that she cannot tolerate statins  Vertigo No red flags Recommend follow-up with neurology Also recommend that she consider pursuing vestibular rehab --currently her limiting factor is insurance.  We had a long discussion about trying to obtain an orange card through [facility] for better access to healthcare.  She is going to try to do this today.  [name], PA-C

## 2022-08-07 NOTE — Patient Instructions (Addendum)
It was great to see you!  Please try to get an orange card.  I will message you regarding your blood work.  Carla [name]

## 2022-09-02 ENCOUNTER — Other Ambulatory Visit: Payer: Self-pay | Admitting: Physician Assistant

## 2022-10-22 ENCOUNTER — Ambulatory Visit: Payer: Self-pay | Admitting: Adult Health

## 2022-11-14 ENCOUNTER — Other Ambulatory Visit: Payer: Self-pay | Admitting: Adult Health

## 2022-11-20 ENCOUNTER — Other Ambulatory Visit: Payer: Self-pay | Admitting: Physician Assistant

## 2022-12-18 ENCOUNTER — Encounter: Payer: Self-pay | Admitting: Physician Assistant

## 2022-12-18 ENCOUNTER — Ambulatory Visit (INDEPENDENT_AMBULATORY_CARE_PROVIDER_SITE_OTHER): Payer: Medicaid Other | Admitting: Physician Assistant

## 2022-12-18 VITALS — BP 118/80 | HR 103 | Temp 98.2°F | Ht 65.0 in | Wt 141.0 lb

## 2022-12-18 DIAGNOSIS — R21 Rash and other nonspecific skin eruption: Secondary | ICD-10-CM

## 2022-12-18 DIAGNOSIS — I10 Essential (primary) hypertension: Secondary | ICD-10-CM | POA: Diagnosis not present

## 2022-12-18 DIAGNOSIS — R000 Tachycardia, unspecified: Secondary | ICD-10-CM

## 2022-12-18 DIAGNOSIS — F419 Anxiety disorder, unspecified: Secondary | ICD-10-CM | POA: Diagnosis not present

## 2022-12-18 LAB — CBC WITH DIFFERENTIAL/PLATELET
Basophils Absolute: 0 10*3/uL (ref 0.0–0.1)
Basophils Relative: 0.6 % (ref 0.0–3.0)
Eosinophils Absolute: 0.1 10*3/uL (ref 0.0–0.7)
Eosinophils Relative: 1.5 % (ref 0.0–5.0)
HCT: 41.7 % (ref 36.0–46.0)
Hemoglobin: 13.9 g/dL (ref 12.0–15.0)
Lymphocytes Relative: 34.9 % (ref 12.0–46.0)
Lymphs Abs: 2 10*3/uL (ref 0.7–4.0)
MCHC: 33.2 g/dL (ref 30.0–36.0)
MCV: 89.8 fl (ref 78.0–100.0)
Monocytes Absolute: 0.4 10*3/uL (ref 0.1–1.0)
Monocytes Relative: 7.7 % (ref 3.0–12.0)
Neutro Abs: 3.2 10*3/uL (ref 1.4–7.7)
Neutrophils Relative %: 55.3 % (ref 43.0–77.0)
Platelets: 375 10*3/uL (ref 150.0–400.0)
RBC: 4.64 Mil/uL (ref 3.87–5.11)
RDW: 13.5 % (ref 11.5–15.5)
WBC: 5.8 10*3/uL (ref 4.0–10.5)

## 2022-12-18 LAB — COMPREHENSIVE METABOLIC PANEL
ALT: 34 U/L (ref 0–35)
AST: 22 U/L (ref 0–37)
Albumin: 4.3 g/dL (ref 3.5–5.2)
Alkaline Phosphatase: 98 U/L (ref 39–117)
BUN: 12 mg/dL (ref 6–23)
CO2: 32 mEq/L (ref 19–32)
Calcium: 9.9 mg/dL (ref 8.4–10.5)
Chloride: 103 mEq/L (ref 96–112)
Creatinine, Ser: 0.75 mg/dL (ref 0.40–1.20)
GFR: 91.72 mL/min (ref >60.00)
Glucose, Bld: 72 mg/dL (ref 70–99)
Potassium: 4.6 mEq/L (ref 3.5–5.1)
Sodium: 142 mEq/L (ref 135–145)
Total Bilirubin: 0.7 mg/dL (ref 0.2–1.2)
Total Protein: 7.2 g/dL (ref 6.0–8.3)

## 2022-12-18 LAB — TSH: TSH: 1.34 u[IU]/mL (ref 0.35–5.50)

## 2022-12-18 MED ORDER — TRIAMCINOLONE ACETONIDE 0.1 % EX CREA: TOPICAL_CREAM | CUTANEOUS | 5 refills | Status: DC

## 2022-12-18 NOTE — Progress Notes (Signed)
Carla [name] is a 52 y.o. female here for a follow up of a pre-existing problem.  History of Present Illness:   Chief Complaint  Patient presents with   Hypertension    PT thinks she is having a reaction to Metoprolol, when she takes it feels like her throat is swelling.    HPI  Patient was seen with in person interpreter, Cheral Marker.  Hypertension She visited the urgent care on 12/15/22 for fatigue and SOB. She has not been feeling better since then. She notes an accompanying cough which has improved. She reports tachycardia after starting Mucinex but this resolved when she stopped taking it. She said the SOB is a sensation that she has to take a big breath to fill up her lungs - no exertional SOB or difficulty breathing.  She feels as if she is experiencing negative side effects from her Metoprolol '25mg'$  BID, which she is taking daily, half in the morning and half in the afternoon. She has not taken any Metoprolol today. She feels as if she has throat swelling when she does take the Metoprolol. She reports that she feels like she is running hot but has not measured a temp at home. She has a working BP monitor at home but has not checked her BP at home. BP Readings from Last 3 Encounters:  12/18/22 118/80  08/07/22 128/88  06/18/22 134/86    Left Wrist Rash She has two areas of discoloration on her left wrist, medial and lateral. She is not using any creams. She wears latex gloves when working. Denies discharge from area.  Anxiety She reports that it waxes and wanes but is not severe enough for medication.   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   Hyperlipidemia    Hypertension    Stroke ([place])    09/2015   Vertigo      Social History   Tobacco Use   Smoking status: Never   Smokeless tobacco: Never  Vaping Use   Vaping Use: Never used  Substance Use Topics   Alcohol use: No   Drug use: No     Past Surgical History:  Procedure Laterality Date   RIGHT ARM SURGERY     TEE WITHOUT CARDIOVERSION N/A 10/13/2015   Procedure: TRANSESOPHAGEAL ECHOCARDIOGRAM (TEE);  Surgeon: Lelon Perla, MD;  Location: [facility] ENDOSCOPY;  Service: Cardiovascular;  Laterality: N/A;    Family History  Problem Relation Age of Onset   Leukemia Mother    Colon cancer Brother    Asthma Neg Hx    Cancer Neg Hx    Diabetes Neg Hx    Hyperlipidemia Neg Hx    Heart failure Neg Hx    Hypertension Neg Hx    Migraines Neg Hx    Rashes / Skin problems Neg Hx    Seizures Neg Hx    Stroke Neg Hx    Thyroid disease Neg Hx     Allergies  Allergen Reactions   Pravastatin Anxiety   Tramadol Nausea Only    Current Medications:   Current Outpatient Medications:    amitriptyline (ELAVIL) 25 MG tablet, TOME UNA TABLETA TODOS LOS DIAS AL ACOSTARSE, Disp: 90 tablet, Rfl: 3   aspirin EC 81 MG tablet, Take 81 mg by mouth daily. Swallow whole., Disp: , Rfl:    fenofibrate (TRICOR) 145 MG tablet, Take 1 tablet (145 mg total) by mouth daily., Disp:  90 tablet, Rfl: 1   meclizine (ANTIVERT) 25 MG tablet, Take 1 tablet (25 mg total) by mouth 2 (two) times daily as needed for dizziness., Disp: 60 tablet, Rfl: 5   metoprolol tartrate (LOPRESSOR) 25 MG tablet, TAKE 1/2 TABLET POR VIA ORAL DOS VECES AL DIA, Disp: 90 tablet, Rfl: 1   triamcinolone cream (KENALOG) 0.1 %, APPLY TO AFFECTED AREA 1-2 TIMES DAILY, Disp: 30 g, Rfl: 5   Review of Systems:   Review of Systems  Constitutional:  Positive for malaise/fatigue. Negative for fever.  HENT:  Negative for congestion.   Eyes:  Negative for blurred vision.  Respiratory:  Positive for cough and shortness of breath.   Cardiovascular:  Negative for chest pain, palpitations and leg swelling.  Gastrointestinal:  Negative for vomiting.  Musculoskeletal:  Negative for back pain.  Skin:  Positive for rash (Left wrist).  Neurological:  Negative for loss of consciousness and  headaches.  Psychiatric/Behavioral:  The patient is nervous/anxious.     Vitals:   Vitals:   12/18/22 0844  BP: 118/80  Pulse: (!) 103  Temp: 98.2 F (36.8 C)  TempSrc: Temporal  SpO2: 98%  Weight: 141 lb (64 kg)  Height: '5\' 5"'$  (1.651 m)     Body mass index is 23.46 kg/m.  Physical Exam:   Physical Exam Vitals and nursing note reviewed.  Constitutional:      General: She is not in acute distress.    Appearance: She is well-developed. She is not ill-appearing or toxic-appearing.  Cardiovascular:     Rate and Rhythm: Normal rate and regular rhythm.     Pulses: Normal pulses.     Heart sounds: Normal heart sounds, S1 normal and S2 normal.  Pulmonary:     Effort: Pulmonary effort is normal.     Breath sounds: Normal breath sounds.  Skin:    General: Skin is warm and dry.     Comments: Left hand with 2 areas of erythematous plaques on back of hand  Neurological:     Mental Status: She is alert.     GCS: GCS eye subscore is 4. GCS verbal subscore is 5. GCS motor subscore is 6.  Psychiatric:        Speech: Speech normal.        Behavior: Behavior normal. Behavior is cooperative.     Assessment and Plan:   Essential hypertension Normotensive in office We decided that she is going to hold her medication at this time due to concern for side effects from medication I discussed with her that her blood pressure goal is below 130/90 I recommend that she check her blood pressure 2-3 times per week and document this for [place] I recommend that she follow-up in 1 month for a physical and we will review this again She is to call us if she has blood pressure readings greater than 130/90 and we can possibly trial a different medication such as amlodipine  Tachycardia No red flags, suspect due to recent illness and anxiety She does have mild tachycardia on my exam I will obtain a D-dimer today and we will obtain a CTA if this is positive She does have a component of anxiety that is  uncontrolled but she does not want to do anything for this at this time Continue aspirin 81 mg daily No chest pain or exertional symptoms, however if this develops she is to go to the emergency room  Anxiety Uncontrolled She declines any intervention Continue to monitor  Rash  and nonspecific skin eruption Recommend trialing topical triamcinolone to area as this appears to be some sort of contact dermatitis If symptoms persist, recommend she reach out to our office She probably need to change her gloves to something that less caustic   I,Alexander Ruley,acting as a scribe for [employer], PA.,have documented all relevant documentation on the behalf of [name], PA,as directed by  [name], PA while in the presence of [name], [place].  I, [name], [place], have reviewed all documentation for this visit. The documentation on 12/18/22 for the exam, diagnosis, procedures, and orders are all accurate and complete.  [name], PA-C

## 2022-12-18 NOTE — Patient Instructions (Signed)
It was great to see you!  Stop your blood pressure medicine since its making you feel bad Check 2-3 times a week  If you get a headache, try over the counter tylenol or excedrin migraine, if no relief, may trial ibuprofen  Let's follow-up in 1 month for physical with pap smear, sooner if you have concerns.  Take care,  [name] PA-C

## 2022-12-19 LAB — D-DIMER, QUANTITATIVE: D-Dimer, Quant: <0.19 mcg/mL FEU (ref <0.50)

## 2022-12-30 ENCOUNTER — Emergency Department (HOSPITAL_COMMUNITY)
Admission: EM | Admit: 2022-12-30 | Discharge: 2022-12-30 | Disposition: A | Discharge status: Home / Self Care | Payer: Medicaid Other | Attending: Emergency Medicine | Admitting: Emergency Medicine

## 2022-12-30 ENCOUNTER — Other Ambulatory Visit: Payer: Self-pay

## 2022-12-30 ENCOUNTER — Emergency Department (HOSPITAL_COMMUNITY): Payer: Medicaid Other

## 2022-12-30 ENCOUNTER — Telehealth: Payer: Self-pay | Admitting: Physician Assistant

## 2022-12-30 DIAGNOSIS — R110 Nausea: Secondary | ICD-10-CM | POA: Diagnosis not present

## 2022-12-30 DIAGNOSIS — Z7982 Long term (current) use of aspirin: Secondary | ICD-10-CM | POA: Diagnosis not present

## 2022-12-30 DIAGNOSIS — R109 Unspecified abdominal pain: Secondary | ICD-10-CM | POA: Insufficient documentation

## 2022-12-30 LAB — COMPREHENSIVE METABOLIC PANEL
ALT: 20 U/L (ref 0–44)
AST: 23 U/L (ref 15–41)
Albumin: 3.7 g/dL (ref 3.5–5.0)
Alkaline Phosphatase: 70 U/L (ref 38–126)
Anion gap: 5 (ref 5–15)
BUN: 6 mg/dL (ref 6–20)
CO2: 28 mmol/L (ref 22–32)
Calcium: 9 mg/dL (ref 8.9–10.3)
Chloride: 106 mmol/L (ref 98–111)
Creatinine, Ser: 0.91 mg/dL (ref 0.44–1.00)
GFR, Estimated: >60 mL/min (ref >60)
Glucose, Bld: 97 mg/dL (ref 70–99)
Potassium: 3.6 mmol/L (ref 3.5–5.1)
Sodium: 139 mmol/L (ref 135–145)
Total Bilirubin: 0.3 mg/dL (ref 0.3–1.2)
Total Protein: 6.8 g/dL (ref 6.5–8.1)

## 2022-12-30 LAB — CBC WITH DIFFERENTIAL/PLATELET
Abs Immature Granulocytes: 0.02 10*3/uL (ref 0.00–0.07)
Basophils Absolute: 0.1 10*3/uL (ref 0.0–0.1)
Basophils Relative: 1 %
Eosinophils Absolute: 0.1 10*3/uL (ref 0.0–0.5)
Eosinophils Relative: 1 %
HCT: 38.4 % (ref 36.0–46.0)
Hemoglobin: 12.9 g/dL (ref 12.0–15.0)
Immature Granulocytes: 0 %
Lymphocytes Relative: 33 %
Lymphs Abs: 2.4 10*3/uL (ref 0.7–4.0)
MCH: 29.9 pg (ref 26.0–34.0)
MCHC: 33.6 g/dL (ref 30.0–36.0)
MCV: 89.1 fL (ref 80.0–100.0)
Monocytes Absolute: 0.6 10*3/uL (ref 0.1–1.0)
Monocytes Relative: 8 %
Neutro Abs: 4.1 10*3/uL (ref 1.7–7.7)
Neutrophils Relative %: 57 %
Platelets: 345 10*3/uL (ref 150–400)
RBC: 4.31 MIL/uL (ref 3.87–5.11)
RDW: 12.4 % (ref 11.5–15.5)
WBC: 7.2 10*3/uL (ref 4.0–10.5)
nRBC: 0 % (ref 0.0–0.2)

## 2022-12-30 LAB — URINALYSIS, ROUTINE W REFLEX MICROSCOPIC
Bilirubin Urine: NEGATIVE
Glucose, UA: NEGATIVE mg/dL
Hgb urine dipstick: NEGATIVE
Ketones, ur: NEGATIVE mg/dL
Leukocytes,Ua: NEGATIVE
Nitrite: NEGATIVE
Protein, ur: NEGATIVE mg/dL
Specific Gravity, Urine: 1.009 (ref 1.005–1.030)
pH: 8 (ref 5.0–8.0)

## 2022-12-30 MED ORDER — KETOROLAC TROMETHAMINE 30 MG/ML IJ SOLN
15.0000 mg | Freq: Once | INTRAMUSCULAR | Status: AC
  Administered 2022-12-30: 15 mg via INTRAMUSCULAR
  Filled 2022-12-30: qty 1

## 2022-12-30 NOTE — ED Triage Notes (Signed)
Pt arrives with c/o left side lower back pain that started Saturday. Pt endorse nausea and urinary frequency. Pt denies painful urination. Pt endorses dizziness and blurred vision that started Saturday also. Pt denies focal weakness, facial droop, or slurred speech. Pt a&ox4.

## 2022-12-30 NOTE — ED Provider Notes (Signed)
[facility] Provider Note   CSN: [id number] Arrival date & time: 12/30/22  1544     History  Chief Complaint  Patient presents with   Back Pain   Nausea    Carla [name] is a 52 y.o. female.  HPI Patient presents with left flank pain.  She otherwise well, but over the past 3 days has had left flank pain.  Pain is focally in the flank, not hip.  No leg pain or weakness, no dysuria, though she does have polyuria.  No abdominal pain, vomiting though she is nauseous.    Home Medications Prior to Admission medications   Medication Sig Start Date End Date Taking? Authorizing Provider  amitriptyline (ELAVIL) 25 MG tablet TOME UNA TABLETA TODOS LOS DIAS AL ACOSTARSE 06/24/22   Frann Rider, NP  aspirin EC 81 MG tablet Take 81 mg by mouth daily. Swallow whole.    [provider]  fenofibrate (TRICOR) 145 MG tablet Take 1 tablet (145 mg total) by mouth daily. 08/07/22   [name], PA  meclizine (ANTIVERT) 25 MG tablet Take 1 tablet (25 mg total) by mouth 2 (two) times daily as needed for dizziness. 04/10/22   Frann Rider, NP  metoprolol tartrate (LOPRESSOR) 25 MG tablet TAKE 1/2 TABLET POR VIA ORAL DOS VECES AL DIA 09/02/22   [name], PA  triamcinolone cream (KENALOG) 0.1 % Apply to affected area 1-2 times daily 12/18/22   [name], PA      Allergies    Pravastatin and Tramadol    Review of Systems   Review of Systems  All other systems reviewed and are negative.   Physical Exam Updated Vital Signs BP (!) 150/91   Pulse 81   Temp 98.4 F (36.9 C)   Resp 18   Wt 64 kg   LMP 01/16/2020   SpO2 100%   BMI 23.46 kg/m  Physical Exam Vitals and nursing note reviewed.  Constitutional:      General: She is not in acute distress.    Appearance: She is well-developed.  HENT:     Head: Normocephalic and atraumatic.  Eyes:     Conjunctiva/sclera: Conjunctivae normal.  Cardiovascular:     Rate and  Rhythm: Normal rate and regular rhythm.  Pulmonary:     Effort: Pulmonary effort is normal. No respiratory distress.     Breath sounds: Normal breath sounds. No stridor.  Abdominal:     General: There is no distension.     Comments: Minimal discomfort that the patient states is deep with palpation of the left flank  Skin:    General: Skin is warm and dry.  Neurological:     Mental Status: She is alert and oriented to person, place, and time.     Cranial Nerves: No cranial nerve deficit.     Comments: No pain with left straight leg raising  Psychiatric:        Mood and Affect: Mood normal.     ED Results / Procedures / Treatments   Labs (all labs ordered are listed, but only abnormal results are displayed) Labs Reviewed  URINALYSIS, ROUTINE W REFLEX MICROSCOPIC - Abnormal; Notable for the following components:      Result Value   Color, Urine STRAW (*)    All other components within normal limits  CBC WITH DIFFERENTIAL/PLATELET  COMPREHENSIVE METABOLIC PANEL    EKG None  Radiology CT ABDOMEN PELVIS WO CONTRAST  Result Date: 12/30/2022 CLINICAL DATA:  Left flank pain. EXAM: CT ABDOMEN AND PELVIS WITHOUT CONTRAST TECHNIQUE: Multidetector CT imaging of the abdomen and pelvis was performed following the standard protocol without IV contrast. RADIATION DOSE REDUCTION: This exam was performed according to the departmental dose-optimization program which includes automated exposure control, adjustment of the mA and/or kV according to patient size and/or use of iterative reconstruction technique. COMPARISON:  08/20/2021. FINDINGS: Lower chest: Clear lung bases. Hepatobiliary: Adjacent large hypo attending liver masses in the right lobe consistent with the hemangioma seen on the prior CT, larger more superior lesion, 10.2 x 7.2 cm transversely, smaller more inferior component, 5.6 cm in diameter. No other liver masses or lesions. Normal gallbladder. No bile duct dilation. Pancreas:  Unremarkable. No pancreatic ductal dilatation or surrounding inflammatory changes. Spleen: Normal in size without focal abnormality. Adrenals/Urinary Tract: Adrenal glands are unremarkable. Kidneys are normal, without renal calculi, focal lesion, or hydronephrosis. Bladder is unremarkable. Stomach/Bowel: Stomach is within normal limits. Appendix appears normal. No evidence of bowel wall thickening, distention, or inflammatory changes. Vascular/Lymphatic: No significant vascular findings are present. No enlarged abdominal or pelvic lymph nodes. Reproductive: Uterus and bilateral adnexa are unremarkable. Other: No abdominal wall hernia or abnormality. No abdominopelvic ascites. Musculoskeletal: No acute or significant osseous findings. IMPRESSION: 1. No acute findings. No renal or ureteral stones. No findings to account for flank pain. 2. Stable large liver hemangiomas. Electronically Signed   By: Lajean Manes M.D.   On: 12/30/2022 18:34    Procedures Procedures    Medications Ordered in ED Medications  ketorolac (TORADOL) 30 MG/ML injection 15 mg (15 mg Intramuscular Given 12/30/22 2014)    ED Course/ Medical Decision Making/ A&P                             Medical Decision Making Generally well female presents with flank pain.  History of hyperlipidemia, possible kidney stone.  Differential including kidney stone, kidney infection, musculoskeletal etiology, patient had CT, labs, Toradol, with complete resolution of her pain.  No evidence for bacteremia, sepsis, stone suspicion for recently passed stone, however. Other considerations including shingles, though the pain level is not consistent with this and there is no rash.  Musculoskeletal other strong possibility.  Given complete resolution of pain here absence of hemodynamic instability, patient discharged in stable condition.  Amount and/or Complexity of Data Reviewed Labs: ordered. Decision-making details documented in ED Course. Radiology:  ordered and independent interpretation performed. Decision-making details documented in ED Course.  Risk OTC drugs. Decision regarding hospitalization.  On repeat exam patient has no pain.  0.       Final Clinical Impression(s) / ED Diagnoses Final diagnoses:  Flank pain    Rx / DC Orders ED Discharge Orders     None         Carmin Muskrat, MD 12/30/22 2153

## 2022-12-30 NOTE — Telephone Encounter (Signed)
Pt to go to ED   patient Name: Carla [name] Gender: Female DOB: 1970-12-23 Age: 52 Y 2 M 1 D Return Phone Number: [phone] (Primary) Address: City/ State/ Zip: [place]  [address] Client [facility], [facility] Provider Bufford Buttner, [place]- [place] Contact Type Call Who Is Calling Patient / Member / Family / Caregiver Call Type Triage / Clinical Relationship To Patient Self Return Phone Number [phone] (Primary) Chief Complaint Double Vision Reason for Call Symptomatic / Request for Health Information Initial Comment Caller states since Saturday afternoon she is having lower back, left hip, dizzy, nausea and blurry vision. Caller states she gets very tired. Caller was transferred from office. Translation Yes Translation Language Spanish Nurse Assessment Nurse: Suezanne Jacquet, RN, Riley Lam Date/Time Lamount Cohen Time): 12/30/2022 2:48:12 PM Confirm and document reason for call. If symptomatic, describe symptoms. ---Caller states since Saturday afternoon she is having left lower back; left hip, dizzy, nausea , blurry vision and fatigue. The blurry vision and all symptoms began started Saturday afternoon. 2 days ago. Hx of htn. Denies any new accidents or trauma and is checking bp right now. Does the patient have any new or worsening symptoms? ---Yes Will a triage be completed? ---Yes Related visit to physician within the last 2 weeks? ---No Does the PT have any chronic conditions? (i.e. diabetes, asthma, this includes High risk factors for pregnancy, etc.) ---Yes List chronic conditions. ---kidney stones, htn Is the patient pregnant or possibly pregnant? (Ask all females between the ages of 29-55) ---No Is this a behavioral health or substance abuse call? ---No PLEASE NOTE: All timestamps contained within this report are represented as [place] Standard Time. CONFIDENTIALTY NOTICE: This fax  transmission is intended only for the addressee. It contains information that is legally privileged, confidential or otherwise protected from use or disclosure. If you are not the intended recipient, you are strictly prohibited from reviewing, disclosing, copying using or disseminating any of this information or taking any action in reliance on or regarding this information. If you have received this fax in error, please notify us immediately by telephone so that we can arrange for its return to [place]. Phone: [phone], Toll-Free: [phone], Fax: [phone] Page: 2 of 3 Call Id: [id number] Guidelines Guideline Title Affirmed Question Affirmed Notes Nurse Date/Time Lamount Cohen Time) Vision Loss or Change [1] Blurred vision or visual changes AND [2] present now AND [3] sudden onset or new (e.g., minutes, hours, days) (Exception: Seeing floaters / black specks OR previously diagnosed migraine headaches with same symptoms.) Camillia Herter 12/30/2022 2:59:46 PM Disp. Time Lamount Cohen Time) Disposition Final User 12/30/2022 3:02:46 PM Go to ED Now (or PCP triage) Yes Suezanne Jacquet, RN, Riley Lam Final Disposition 12/30/2022 3:02:46 PM Go to ED Now (or PCP triage) Yes Suezanne Jacquet, RN, [place] Spaniel Disagree/Comply Comply Caller Understands Yes PreDisposition Call Doctor Care Advice Given Per Guideline GO TO ED NOW (OR PCP TRIAGE): CARE ADVICE given per Vision Loss or Change (Adult) guideline. ANOTHER ADULT SHOULD DRIVE: Comments User: Cameron Proud, RN Date/Time Lamount Cohen Time): 12/30/2022 2:48:42 PM interpreter ID [id number] User: Cameron Proud, RN Date/Time Lamount Cohen Time): 12/30/2022 2:57:35 PM bp 136/88, HR 119 User: Cameron Proud, RN Date/Time (Eastern Time): 12/30/2022 3:01:23 PM Menopause 2 years ago. User: Cameron Proud, RN Date/Time Lamount Cohen Time): 12/30/2022 3:04:34 PM Plans to have husband drive to the hospital. PLEASE NOTE: All timestamps contained within this report are represented as [place]  Standard Time. CONFIDENTIALTY NOTICE: This fax transmission  is intended only for the addressee. It contains information that is legally privileged, confidential or otherwise protected from use or disclosure. If you are not the intended recipient, you are strictly prohibited from reviewing, disclosing, copying using or disseminating any of this information or taking any action in reliance on or regarding this information. If you have received this fax in error, please notify us immediately by telephone so that we can arrange for its return to [place]. Phone: [phone], Toll-Free: [phone], Fax: [phone] Page: 3 of 3 Call Id: [id number] Referrals [facility] - ED

## 2022-12-30 NOTE — Telephone Encounter (Signed)
Patient called wanting to be seen by PCP. Patient having Back/ hip pain, blurry vision, very dizzy, weak. Patient has been triaged, awaiting notes.

## 2022-12-30 NOTE — ED Provider Triage Note (Signed)
Emergency Medicine Provider Triage Evaluation Note  Carla [name] , a 52 y.o. female  was evaluated in triage.  Pt complains of left flank pain, nausea, and urinary frequency for the last 2 days.  She states that she has never had the symptoms before and does not have a history of frequent UTIs.  She does state that she has had kidney stones noted to be in her kidneys before but has never passed 1.  She states that yesterday the pain got so severe that she began to feel dizzy and had trouble walking.  Pain is all the time but does seem to wax and wane in intensity.  No fever, chills, chest pain, shortness of breath, edema, dysuria, hematuria, or other symptoms.  Review of Systems  Positive: See HPI Negative: See HPI  Physical Exam  BP (!) 150/91   Pulse 81   Temp 98.4 F (36.9 C)   Resp 18   Wt 64 kg   LMP 01/16/2020   SpO2 100%   BMI 23.46 kg/m  Gen:   Awake, no distress   Resp:  Normal effort  MSK:   Moves extremities without difficulty  Other:  Mild L CVA tenderness, no R CVA tenderness, abdomen soft, nontender, without rebound, guarding, or peritoneal signs  Medical Decision Making  Medically screening exam initiated at 5:43 PM.  Appropriate orders placed.  Nalia Honeycutt was informed that the remainder of the evaluation will be completed by another provider, this initial triage assessment does not replace that evaluation, and the importance of remaining in the ED until their evaluation is complete.     Suzzette Righter, PA-C 12/30/22 1745

## 2022-12-30 NOTE — Discharge Instructions (Signed)
As discussed, your pain is likely due to a recently passed kidney stone, though other possibilities include inflammation from recent kidney infection or possibly early shingles remain, and it is important to monitor your condition carefully.    For the next 3 days please use ibuprofen, 400 mg, taken 3 times daily, with food.  Follow-up with your physician or return here if you develop new, or concerning changes in your condition.    Como se mencion anteriormente, es probable que Psychiatric nurse se deba a un clculo renal eliminado recientemente, aunque otras posibilidades incluyen inflamacin debido a una infeccin renal reciente o posiblemente restos tempranos de culebrilla, y es importante controlar su condicin cuidadosamente.  Durante los prximos 3 das use ibuprofeno, 400 mg, tomado 3 veces al da, con alimentos.  Haga un seguimiento con su mdico o regrese aqu si desarrolla cambios nuevos o preocupantes en su condicin.

## 2022-12-30 NOTE — Telephone Encounter (Signed)
See Triage notes, Pt to go to ED now.

## 2023-01-21 ENCOUNTER — Encounter: Payer: Medicaid Other | Admitting: Physician Assistant

## 2023-01-21 NOTE — Progress Notes (Deleted)
[name] [address] [place] [place] [address] [phone]       OFFICE FOLLOW UP NOTE  Carla [name]. Carla [name] [name] Date of Birth:  [birth year] Medical Record Number:  [id number]   [facility] provider: Dr. Pearlean Brownie Reason for Referral: Recurrence of migraines   Chief complaint No chief complaint on file.    HPI:  Update 01/22/2023 JM: Returns for follow-up visit after prior visit over 1 year ago.  Accompanied by Carla [name] [name] health interpreter.  Migraines ***, continues on amitriptyline 25 mg nightly.  Ongoing use of meclizine for vertigo   Denies new stroke/TIA symptoms Compliant on aspirin and fenofibrate Blood pressure *** Routinely follows with PCP     History provided for reference purposes only Update 10/17/2021 JM: Returns for 30-month follow-up accompanied by [name] interpreter  Migraines stable - reports 2-3 migraines per month.  Remains on amitriptyline 25 mg nightly Does endorse awakening with very mild headache every morning  Vertigo stable as long as she takes meclizine once daily. She did try to stop meclizine but felt nauseous without it. Feels like liquid is draining from left ear - was seen by PCP back in August regarding this concern - she was referred to vestibular rehab due to worsening vertigo but did not attend due to lack of insurance. Reports when liquid is coming from ears is when she will feel nauseous and loses balance. Reports seeing a doctor for this concern last year but unsure if this was a specialist (unable to view via epic)  She has also been experiencing forgetfulness which is not new. Examples include she will forget what she is supposed to be doing in the moment but will remember later on. She may misplace items but again remember where she placed them later on. Reports sleeping well at night but is fatigued during the day. Reports husband has told her she snores. No witnessed apneas.   No new stroke/TIA symptoms. Compliant on aspirin  81 mg daily and fenofibrate -denies side effects Recent LDL 116 (PCP increased fenofibrate from 54mg  to 145 mg daily) Blood pressure today 144/86  No further concerns at this time  Update 04/16/2021 JM: Carla [name] [name] returns for 76-month follow-up accompanied by [name] interpreter.  Migraines have been stable - reports 1-2 per month typically lasting 30-40 minutes. Will use tylenol on occasion with benefit. She has remained on amitrityline but reports not being on effexor (denies ever being on it)  Vertigo stable occurring 1-2x weekly - will use meclizine only as needed with benefit  Stable from stroke standpoint without new stroke/TIA symptoms.  Reports intermittent use of aspirin 81mg  daily and daily use of fenofibrate without associated side effects.  PCP recently recommended started atorvastatin but she denies receiving prescription due to lipid panel showing LDL 122.  She has trialed atorvastatin in the past but unsure if reported side effects at that time from statin or multiple other medications being taken at this time.  She is willing to trial again.  Blood pressure today 128/87.    No further concerns at this time   Update 10/16/2020 JM: Carla [name] [name] returns for 60-month follow-up accompanied by interpreter.  At prior visit, complaints of continued migraines which have since greatly improved with use of amitriptyline 25 mg nightly and venlafaxine 37.5 mg daily tolerating well without side effects.  Reports 1 migraine over the past month.  She does report occasional tension type headaches but has been told likely due to new rx lenses and have been slowly  improving.  Prior complaints of intermittent vertigo but overall stable and will take meclizine only as needed.  She does report evaluation by ENT Dr. Suszanne Conners for vertigo approximately 3 to 4 months ago but has not received results.  Stable from TIA/stroke standpoint without new or reoccurring stroke/TIA symptoms.  Remains on aspirin 81 mg  daily and omega-3 for secondary stroke prevention of side effects.  Blood pressure today 125/76.  No further concerns at this time.  Update 06/27/2020 JM: Carla [name] [name] returns for follow-up accompanied by interpreter regarding reoccurrence of chronic migraines with underlying history of migraines, cryptogenic stroke 2016, TIA, vertigo, depression and anxiety. Evaluated at [facility] ED on 06/13/2020 due to pain and numbness in her left arm accompanied by headache. All work-up unremarkable including D-dimer, EKG, chest x-ray and CT head and treated for presumed cervical radiculopathy with naproxen and advised to follow-up with PCP.  Evaluated by PCP 06/19/2020 with complete resolution of symptoms. Of note, presented to ED on 03/16/2020 with chief complaint of confusion and left arm numbness with headache and diagnosed with complex migraine. Migraine occurrence has improved since prior visit with ongoing use of amitriptyline 10 mg nightly.  Severe migraine occurrence 4 -5 times per months but does experience tension type headaches almost every other day lasting for approximately 1 hour and are not debilitating.  Reports tension type headaches occur with increased stress or anxiety.  She has not had any reoccurrence of left arm numbness or any other neurological symptoms Intermittent vertigo and PCP recently referred to ENT She remains uninsured but is currently in the process of applying for Medicaid   Update 03/21/2020 JM, Carla [name] [name] is being seen today accompanied by her son who assist with interpretation due to recent emergency room visit with left arm numbness and confusion accompanied by headache on 03/16/2020.  MRI brain negative for acute intracranial abnormality.  She was diagnosed with complex migraines as symptoms objective and no focal deficit identified.  She was advised to follow-up outpatient for further evaluation.  Prior medical history of cryptogenic stroke 2016, TIA, vertigo and migraines.  Previously  treated in this office for migraine management on Topamax 25 mg twice daily but self discontinued due to difficulty tolerating.  She has not had any difficulties with migraines in over the past year and has not required any treatment.  Typical migraines recurred approximately 2 weeks ago located right temporal and periorbital area with pulsating quality with blurred vision, photophobia, phonophobia and nausea.  Symptoms consistent with previously known migraines.  She may also experience pain in occipital region.  She has been experiencing migraines every other day which last up until 2 hours after laying down and resting.  She has not had any recurrence of confusion or numbness/tingling.  She also reports symptoms of fatigue, dizziness, and shortness of breath which appears to be anxiety related per son.  She also recently self discontinued lovastatin due to subjective increased anxiety after taking.  Previously discussed underlying anxiety at prior visits which likely contributing and/or worsening symptoms but she has previously declined further management or counseling.  She has continued on aspirin 81 mg daily without bleeding or bruising.  Blood pressure today 128/83.  No further concerns at this time.   Initial visit 09/29/2018 Dr. Pearlean Brownie:  Carla [name] [name] is a pleasant 25 year Hispanic lady originally from [place] who was seen today for evaluation for headaches.  She is accompanied by her friend as well as Spanish language interpreter who provides translation for this  visit.  History is obtained from them, review of electronic medical records and have personally reviewed imaging films.  Patient has been complaining of increasing headaches for the last 2 months.  She describes this headache as being right temporal and frontal mostly throbbing in nature though fluctuating in severity from 6/10 to 10/10.  Headache is accompanied by nausea vomiting light and sound sensitivity.  She has to stop her activities  and lie down and sleeping as well as taking Tylenol seems to help.  Headaches are accompanied by a single vision spots and scintillations and blurred vision.  She denies any speech difficulties, vertigo, diplopia, gait or balance difficulties with headaches.  Headaches usually last about 30 to 40 minutes.  Headaches of late have been occurring about 2-3 times per week.  She does give a remote history of infrequent headaches which sound like migraine headaches and was similar but were mostly perimenstrual and not bad.  The patient also states in September 2019 she had a episode of speech difficulties as well as dizziness.  She was seen by primary care physician who states that the patient had a headache as well as feeling of dizziness and some speech difficulties.  This happened following a stressful event.  An outpatient MRI was ordered which was done on 08/05/2018 which I personally reviewed shows a few nonspecific white matter hyperintensities but no acute abnormality or definite stroke.  Patient states her symptoms improved within a few days but the headaches have persisted since then.  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 MRA of the brain showed slight diminished peripheral branches in the left MCA but no large vessel stenosis or occlusion. Carotid ultrasound showed no significant extracranial stenosis. Transthoracic echo showed normal ejection fraction. Transesophageal echocardiogram showed no cardiac source of embolism or PFO. Antiphospholipid antibodies and lupus anticoagulant were both negative. Lipid profile showed elevated total cholesterol 207 and LDL 139 mg percent. Hemoglobin A1c was 5.6. Patient was started on aspirin as well as protocol. She states her speech difficulty and right-sided weakness completely  improved.  She was placed on aspirin and pravastatin which she actually did well on but for unclear reason she discontinued both medications.  She is currently on Lipitor 40 which was started a few months ago by primary physician but she is complaining of dizziness on this medication and wants to change it or stop it.  She states she had slight balance difficulties from her previous stroke but had recovered well from that.  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  She states her blood pressure is under good control.  Last lipid profile checked in August 2019 showed LDL of 120 following which Lipitor was added.  I had recommended doing a transcranial Doppler bubble study at last visit with me in 2017 but for some unclear reason this was never done.  Update 12/30/2018: Carla [name] [name] is being seen today for follow-up visit for migraines and is accompanied by interpreter as patient is primarily Spanish-speaking.  Upon initial visit with Dr. Pearlean Brownie on 09/29/2018 (visit info below), headaches were not frequent enough to justify initiating migraine prophylaxis.  She did call back on 12/02/2018 reporting daily migraines and therefore initiated Topamax 25 mg twice daily.  She took this medication for 4 days but discontinued as  medication was making her too fatigued.  She consider taking 1 tab at night only but wanted to speak with [place] in regards to this first.  She was evaluated in the ED on 12/19/2018 and returned on 12/23/2018 due to vaginal bleeding.  Apparently, all medications discontinued after 12/23/2018 visit and was started on Provera for 10-day course with recommending follow-up with OB/GYN for further evaluation.  She reports today that her headaches have improved and not experience a headache since ED admission on 12/23/2018.  She does have an appointment scheduled at [facility] on 01/12/2019 for further evaluation.  She denies any continued vaginal bleeding at this time.   Blood pressure today satisfactory 130/68. It was recommended after prior visit to undergo transcranial Doppler with bubble study to assess for possible PFO.  This was obtained on 10/14/2018 and was negative for PFO.  Update 06/21/2019 : She returns for follow-up after last visit 5 months ago.  She is accompanied by a [place] language interpreter who was present throughout the visit and interprets for her.  Patient states her main complaint today is that her migraine headaches seem to have come back.  In the last 3 months she has had increased frequency of headaches which occur 3 to 4 days/week.  The headache starts off as a throbbing pain around her right temple as well as periorbital region becomes unbearable 10/10 in severity.  She has relied on.  Light and sound do worsen the headache.  She does not throw up or feel nauseous.  She does not have visual symptoms with the headaches.  Headache may last for most of the day.  She is unable to identify specific triggers for headaches but does feel tightness in the back of her head and neck muscles.  She has not tried triptans or prescription migraine specific medications she has tried taking Tylenol and ibuprofen without relief.  She has been taking naproxen which seems to work better.  She has been taking them 3 to 4 days a week.  She was previously on Topamax but when she started having dysfunctional uterine bleeding all the medications were stopped including Topamax.  She is unable to tell me if she had any bad side effects on it.  Patient recently changed her primary care physician and a new physician assistant discontinued protocol for unclear reason.  She remains on aspirin which is tolerating well without bruising or bleeding.  Her blood pressure is under good control and today it is 142/83.  She has had no recurrent stroke or TIA symptoms.  Update 09/02/2019: Carla [name] [name] is being seen today for 81-month follow-up regarding migraine headaches accompanied by  interpreter.  After prior visit, Topamax 25 mg daily was restarted due to worsening migraines as well as Relpax for emergent relief.  She did not tolerate Topamax as she reports this caused weight loss and short-term memory loss.  She was unable to afford Relpax.  She has recently been experiencing migraines 2 times weekly and typically lasts for 1 hour total but during this time, migraines are debilitating and continues to be located right temporal area.  She has not trialed any other type of migraine medication.  Recent diagnosis of COVID-19 in 06/2019 and feels as though she has not completely recovered mentally and physically.  After prior visit, lipid panel obtained and recommended restart pravastatin but due to side effects, PCP initiated Crestor.  She reports difficulty tolerating Crestor but plans on following up with PCP for further monitoring and management.  Blood pressure today 135/91.  Update 11/04/2019: Carla [name] [name] is a 52 year old female who is being seen today by patient request due to concerns of balance difficulties concern for stroke.  She is accompanied today by a interpreter.  She did not seek medical treatment at onset.  Approximately 1.5 weeks ago, she states she started experiencing intermittent balance impairment with a swaying type sensation and room spinning.  Symptoms severe where she will have to hold onto something and close her eyes.  She states she experienced same type of symptoms with prior stroke.  She denies associated headache, neck pain, speech difficulty, visual changes, weakness or numbness/tingling.  She denies increased stress or anxiety/depression.  She states standing straight up or laying flat or with head movement induces the symptoms.  She denies currently feeling symptoms at today's visit.  She does endorse recently stopping her metoprolol 3 days ago due to increased stress and irritability symptoms.  She does monitor blood pressure at home and SBP 130s.  She is  unable to endorse diastolic pressures or heart rates.  Blood pressure today satisfactory at 132/96.  He does endorse continuation of aspirin 81 mg daily and recently started on pravastatin approximately 3 weeks ago by PCP.  Update 11/09/2019: Carla [name] [name] is a 52 year old female who is being seen today by patient request due to recent ED admission on 11/06/2019 for ongoing concerns of dizziness previously evaluated in office 5 days ago.  She is accompanied by interpreter.  Per review of ED note, she reported worsening dizziness and headache 2 days prior to ED admission concerning for stroke.  CT head and MRI brain no acute abnormalities.  She was advised to follow-up with PCP and neurology at discharge.  She has been doing well since discharge without any reoccurring vertigo or dizziness symptoms or headaches.  She has continued to take meclizine with benefit 25 mg tablets twice daily.  She continues on aspirin 81 mg daily and pravastatin for secondary stroke prevention without side effects. Blood pressure today 124/71.  No further concerns at this time.  Update 01/04/2020: Carla [name] [name] is a 52 year old female who is being seen today for follow-up regarding prior concerns of dizziness accompanied by interpreter.  Denies residual/dizziness symptoms.  Continues twice daily meclizine with occasional fatigue.  Continues on aspirin 81 mg daily and pravastatin for secondary stroke prevention without side effects.  Blood pressure today 127/78.  Mild short-term memory loss but endorses ongoing improvement.  No concerns at this time.    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   Hyperlipidemia    Hypertension    Stroke (HCC)    09/2015   Vertigo     Social History:  Social History   Socioeconomic History   Marital status: Married     Spouse name: Not on file   Number of children: Not on file   Years of education: Not on file   Highest education level: Not on file  Occupational History   Occupation: Education officer, [employer]  Tobacco Use   Smoking status: Never   Smokeless tobacco: Never  Vaping Use   Vaping Use: Never used  Substance and Sexual Activity   Alcohol use: No   Drug use: No   Sexual activity: Not Currently    Birth control/protection: None  Other Topics Concern   Not on file  Social History Narrative   From [place]   Lived in [place] for 20 years, and been in [place] for 6 years   Husband and son, 79   Works: clean homes   Social Determinants of Corporate investment banker Strain: Not on [employer] [name] Insecurity: Not on file  Transportation Needs: Not on file  Physical Activity: Not on file  Stress: Not on file  Social Connections: Not on file  Intimate Partner Violence: Not on file    Medications:   Current Outpatient Medications on File Prior to Visit  Medication Sig Dispense Refill   amitriptyline (ELAVIL) 25 MG tablet TOME UNA TABLETA TODOS LOS DIAS AL ACOSTARSE 90 tablet 3   aspirin EC 81 MG tablet Take 81 mg by mouth daily. Swallow whole.     fenofibrate (TRICOR) 145 MG tablet Take 1 tablet (145 mg total) by mouth daily. 90 tablet 1   meclizine (ANTIVERT) 25 MG tablet Take 1 tablet (25 mg total) by mouth 2 (two) times daily as needed for dizziness. 60 tablet 5   metoprolol tartrate (LOPRESSOR) 25 MG tablet TAKE 1/2 TABLET POR VIA ORAL DOS VECES AL DIA 90 tablet 1   triamcinolone cream (KENALOG) 0.1 % Apply to affected area 1-2 times daily 30 g 5   No current facility-administered medications on file prior to visit.    Allergies:   Allergies  Allergen Reactions   Pravastatin Anxiety   Tramadol Nausea Only    Physical Exam  There were no vitals filed for this visit.   There is no height or weight on file to calculate BMI.  Physical exam General: well developed, well nourished, pleasant  middle-aged Hispanic female, seated, in no apparent distress Head: head normocephalic and atraumatic.   Neck: supple with no carotid or supraclavicular bruits Cardiovascular: regular rate and rhythm, no murmurs Musculoskeletal: no deformity Skin:  no rash/petichiae Vascular:  Normal pulses all extremities  Neurologic Exam Mental Status: awake and fully alert. Primarily Spanish-speaking with limited English. Denies speech and language concerns.  Oriented to place and time. Recent and remote memory intact. Attention span, concentration and fund of knowledge appropriate. Mood and affect appropriate Cranial Nerves: Pupils equal, briskly reactive to light.  Extraocular movements full without nystagmus. Visual fields full to confrontation. Hearing intact. Facial sensation intact. Face, tongue, palate moves normally and symmetrically.   Motor: Normal bulk and tone. Normal strength in all tested extremity muscles. Sensory.: intact to touch , pinprick , position and vibratory sensation.  Coordination: Rapid alternating movements normal in all extremities. Finger-to-nose and heel-to-shin performed accurately bilaterally. Gait and Station: Arises from chair without difficulty. Stance is normal. Gait demonstrates normal stride length and balance . Able to heel, toe and tandem walk without difficulty.  Romberg negative. Reflexes: 1+ and symmetric. Toes downgoing.      ASSESSMENT/PLAN:  Carla [name] [name] is a 52 year old Hispanic lady with with PMH of cryptogenic stroke 2016, TIA, HDL, HTN, BPPV, anxiety and migraines.  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 -Currently well controlled experiencing 2-3 migraine days per month -Continue amitriptyline  25 mg nightly - refill provided -Previously prescribed venlafaxine but pt denies being on - as she has been stable, will hold off on restarting -hx of  intolerance to Topamax  Vertigo, intermittent -Overall stable although continues to require meclizine daily -unknown cause of occasional "liquid coming from left ear" back and worsen vertigo and nausea- she was advised to follow back up with PCP - may benefit from ENT eval if not already completed -Meclizine refill provided  History of stroke/TIA -Continue aspirin 81 mg and fenofibrate 145mg  daily (PCP recently increased from 54mg  daily) for secondary stroke prevention.  Advised to contact PCP if difficulty tolerating statin and may need to consider use of PCSK9 inhibitor with history of statin intolerance -Continue to follow with PCP for HTN and HLD management and secondary stroke risk factor management with BP goal<130/90 and LDL goal<70 -Stroke labs 08/07/2022: A1c 5.7, LDL 121 ***  Concern for possible sleep apnea -possibly contributing to daytime fatigue, short term memory loss concerns and dull morning headaches -she is currently uninsured - encouraged her to reapply for [employer] so we can pursue sleep study     Follow-up in 1 year or call earlier if needed   CC:  Jarold Motto, [place]     I spent 34 minutes of face-to-face and non-face-to-face time with patient assisted by interpreter.  This included previsit chart review, lab review, study review, order entry, electronic health record documentation, patient education and discussion regarding above diagnoses and treatment plan and answered all other questions to patient's satisfaction  Ihor Austin, [facility]  [facility] [address] [place], [place] [address]  Phone [phone] Fax [phone] Note: This document was prepared with digital dictation and possible smart phrase technology. Any transcriptional errors that result  from this process are unintentional.

## 2023-01-22 ENCOUNTER — Ambulatory Visit: Payer: Medicaid Other | Admitting: Adult Health

## 2023-01-22 NOTE — Progress Notes (Signed)
Subjective:    Carla [name] is a 52 y.o. female and is here for a comprehensive physical exam.  HPI  Health Maintenance Due  Topic Date Due   DTaP/Tdap/Td (1 - Tdap) Never done   COLONOSCOPY (Pts 45-38yr Insurance coverage will need to be confirmed)  Never done   PAP SMEAR-Modifier  04/09/2019   MAMMOGRAM  Never done    Acute Concerns: None  Chronic Issues: Migraines Compliant with Elavil 25 mg daily at bedtime.  Reports increased fatigue recently.  Hyperlipidemia Compliant with Tricor 145 mg daily. Continues on ASA 81 mg daily. Cannot tolerate statins due to side effects per patient reports.  Hypertension Treated with Lopressor 12.5 mg twice daily. Was briefly off of Lopressor due to negative effects but is taking again now. She has not taken her dose yet for today.   Health Maintenance: Immunizations -- Due for tetanus vaccine. UTD on flu, Covid-19, shingles vaccines. Colonoscopy -- Due for first colonoscopy. Mammogram -- Overdue. Reported last mammogram was 2015, normal. PAP -- Last completed 04/08/16. Results show no abnormal cells. Recommended repeat in 2020. Bone Density -- N/A Diet -- Tries to eat healthy. Exercise -- Stays active.  Sleep habits -- Has difficulty sleeping periodically. Mood -- Stable.  UTD with dentist? - UTD. UTD with eye doctor? - Will be making an appointment to go this year.  Weight history: Wt Readings from Last 10 Encounters:  01/29/23 138 lb (62.6 kg)  12/30/22 141 lb (64 kg)  12/18/22 141 lb (64 kg)  08/07/22 145 lb (65.8 kg)  06/17/22 145 lb 8.1 oz (66 kg)  02/15/22 146 lb 9.6 oz (66.5 kg)  10/17/21 149 lb (67.6 kg)  10/10/21 149 lb 8 oz (67.8 kg)  08/19/21 139 lb 15.9 oz (63.5 kg)  07/06/21 150 lb 3.2 oz (68.1 kg)   Body mass index is 22.96 kg/m. Patient's last menstrual period was 01/16/2020.  Alcohol use:  reports no history of alcohol use.  Tobacco use:  Tobacco Use: Low Risk  (01/29/2023)   Patient History     Smoking Tobacco Use: Never    Smokeless Tobacco Use: Never    Passive Exposure: Not on file   Eligible for lung cancer screening? No     01/29/2023    8:05 AM  Depression screen PHQ 2/9  Decreased Interest 0  Down, Depressed, Hopeless 0  PHQ - 2 Score 0  Altered sleeping 2  Tired, decreased energy 0  Change in appetite 3  Feeling bad or failure about yourself  0  Trouble concentrating 3  Moving slowly or fidgety/restless 0  Suicidal thoughts 0  PHQ-9 Score 8  Difficult doing work/chores Not difficult at all     Other providers/specialists: Patient Care Team: WInda Coke PUtahas PCP - General (Physician Assistant)    PMHx, SurgHx, SocialHx, Medications, and Allergies were reviewed in the Visit Navigator and updated as appropriate.   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   Hyperlipidemia    Hypertension    Stroke ([facility]    09/2015   Vertigo      Past Surgical History:  Procedure Laterality Date   RIGHT ARM SURGERY     TEE WITHOUT CARDIOVERSION N/A 10/13/2015   Procedure: TRANSESOPHAGEAL ECHOCARDIOGRAM (TEE);  Surgeon: BLelon Perla MD;  [facility];  Service: Cardiovascular;  Laterality: N/A;     Family History  Problem Relation  Age of Onset   Leukemia Mother    Colon cancer Brother    Anxiety disorder Brother    Asthma Neg Hx    Cancer Neg Hx    Diabetes Neg Hx    Hyperlipidemia Neg Hx    Heart failure Neg Hx    Hypertension Neg Hx    Migraines Neg Hx    Rashes / Skin problems Neg Hx    Seizures Neg Hx    Stroke Neg Hx    Thyroid disease Neg Hx     Social History   Tobacco Use   Smoking status: Never   Smokeless tobacco: Never  Vaping Use   Vaping Use: Never used  Substance Use Topics   Alcohol use: No   Drug use: No    Review of Systems:   Review of Systems  Constitutional:  Positive for malaise/fatigue. Negative for chills, fever and weight  loss.  HENT:  Negative for hearing loss, sinus pain and sore throat.   Respiratory:  Negative for cough and hemoptysis.   Cardiovascular:  Negative for chest pain, palpitations, leg swelling and PND.  Gastrointestinal:  Negative for abdominal pain, constipation, diarrhea, heartburn, nausea and vomiting.  Genitourinary:  Negative for dysuria, frequency and urgency.  Musculoskeletal:  Negative for back pain, myalgias and neck pain.  Skin:  Negative for itching and rash.  Neurological:  Negative for dizziness, tingling, seizures and headaches.  Endo/Heme/Allergies:  Negative for polydipsia.  Psychiatric/Behavioral:  Negative for depression. The patient is not nervous/anxious.     Objective:   BP (!) 136/90 (BP Location: Left Arm, Patient Position: Sitting, Cuff Size: Normal)   Pulse 95   Temp 97.8 F (36.6 C) (Temporal)   Ht '5\' 5"'$  (1.651 m)   Wt 138 lb (62.6 kg)   LMP 01/16/2020   SpO2 97%   BMI 22.96 kg/m  Body mass index is 22.96 kg/m.   General Appearance:    Alert, cooperative, no distress, appears stated age  Head:    Normocephalic, without obvious abnormality, atraumatic  Eyes:    PERRL, conjunctiva/corneas clear, EOM's intact, fundi    benign, both eyes  Ears:    Normal TM's and external ear canals, both ears  Nose:   Nares normal, septum midline, mucosa normal, no drainage    or sinus tenderness  Throat:   Lips, mucosa, and tongue normal; teeth and gums normal  Neck:   Supple, symmetrical, trachea midline, no adenopathy;    thyroid:  no enlargement/tenderness/nodules; no carotid   bruit or JVD  Back:     Symmetric, no curvature, ROM normal, no CVA tenderness  Lungs:     Clear to auscultation bilaterally, respirations unlabored  Chest Wall:    No tenderness or deformity   Heart:    Regular rate and rhythm, S1 and S2 normal, no murmur, rub or gallop  Breast Exam:    Deferred  Abdomen:     Soft, non-tender, bowel sounds active all four quadrants,    no masses, no  organomegaly  Genitalia:    Normal female without lesion, discharge or tenderness  Extremities:   Extremities normal, atraumatic, no cyanosis or edema  Pulses:   2+ and symmetric all extremities  Skin:   Skin color, texture, turgor normal, no rashes or lesions  Lymph nodes:   Cervical, supraclavicular, and axillary nodes normal  Neurologic:   CNII-XII intact, normal strength, sensation and reflexes    throughout    Assessment/Plan:   Routine physical examination Today  patient counseled on age appropriate routine health concerns for screening and prevention, each reviewed and up to date or declined. Immunizations reviewed and up to date or declined. Labs ordered and reviewed. Risk factors for depression reviewed and negative. Hearing function and visual acuity are intact. ADLs screened and addressed as needed. Functional ability and level of safety reviewed and appropriate. Education, counseling and referrals performed based on assessed risks today. Patient provided with a copy of personalized plan for preventive services.  Pap smear for cervical cancer screening Completed today  Special screening for malignant neoplasms, colon Discussed Due to family hx of colon cancer, recommend colonoscopy -- she is agreeable Will place referral  Essential hypertension Above goal - no evidence of end organ damage She has not taken her BP med yet today Continue lopressor 12.5 mg daily Recommend close monitoring of home BP and follow-up if BP consistently >130/90 Follow-up in 6 months  Hyperlipidemia, unspecified hyperlipidemia type Update lipid panel Continue tricor 145 mg daily and ASA Consider referral to lipid clinic due to statin intolerance and hx of CVA  Insulin resistance Update A1c and provide recommendations accordingly  Migraine without status migrainosus, not intractable, unspecified migraine type Well controlled Continue elavil 25 mg daily Follow-up in 6 months, sooner if  concerns  I,Alexander Ruley,acting as a scribe for [employer], PA.,have documented all relevant documentation on the behalf of [name], PA,as directed by  [name], PA while in the presence of [name], [place].  I, [name], [place], have reviewed all documentation for this visit. The documentation on 01/29/23 for the exam, diagnosis, procedures, and orders are all accurate and complete.  [name], PA-C [place]

## 2023-01-29 ENCOUNTER — Other Ambulatory Visit (HOSPITAL_COMMUNITY)
Admission: RE | Admit: 2023-01-29 | Discharge: 2023-01-29 | Disposition: A | Discharge status: Home / Self Care | Payer: Medicaid Other | Source: Ambulatory Visit | Attending: Physician Assistant | Admitting: Physician Assistant

## 2023-01-29 ENCOUNTER — Encounter: Payer: Self-pay | Admitting: Physician Assistant

## 2023-01-29 ENCOUNTER — Ambulatory Visit (INDEPENDENT_AMBULATORY_CARE_PROVIDER_SITE_OTHER): Payer: Medicaid Other | Admitting: Physician Assistant

## 2023-01-29 VITALS — BP 136/90 | HR 95 | Temp 97.8°F | Ht 65.0 in | Wt 138.0 lb

## 2023-01-29 DIAGNOSIS — Z1211 Encounter for screening for malignant neoplasm of colon: Secondary | ICD-10-CM

## 2023-01-29 DIAGNOSIS — E785 Hyperlipidemia, unspecified: Secondary | ICD-10-CM | POA: Diagnosis not present

## 2023-01-29 DIAGNOSIS — G43909 Migraine, unspecified, not intractable, without status migrainosus: Secondary | ICD-10-CM

## 2023-01-29 DIAGNOSIS — Z0000 Encounter for general adult medical examination without abnormal findings: Secondary | ICD-10-CM | POA: Diagnosis not present

## 2023-01-29 DIAGNOSIS — E88819 Insulin resistance, unspecified: Secondary | ICD-10-CM

## 2023-01-29 DIAGNOSIS — Z124 Encounter for screening for malignant neoplasm of cervix: Secondary | ICD-10-CM | POA: Insufficient documentation

## 2023-01-29 DIAGNOSIS — I10 Essential (primary) hypertension: Secondary | ICD-10-CM | POA: Diagnosis not present

## 2023-01-29 LAB — LIPID PANEL
Cholesterol: 190 mg/dL (ref 0–200)
HDL: 59.9 mg/dL (ref >39.00)
LDL Cholesterol: 109 mg/dL — ABNORMAL HIGH (ref 0–99)
NonHDL: 130.27
Total CHOL/HDL Ratio: 3
Triglycerides: 108 mg/dL (ref 0.0–149.0)
VLDL: 21.6 mg/dL (ref 0.0–40.0)

## 2023-01-29 LAB — HEMOGLOBIN A1C: Hgb A1c MFr Bld: 5.6 % (ref 4.6–6.5)

## 2023-01-29 NOTE — Patient Instructions (Addendum)
It was great to see you!  Schedule your mammogram We will place referral for you to schedule your colonoscopy  Please go to the lab for blood work.   Our office will call you with your results unless you have chosen to receive results via MyChart.  If your blood work is normal we will follow-up each year for physicals and as scheduled for chronic medical problems.  If anything is abnormal we will treat accordingly and get you in for a follow-up.  Take care,  Aldona Bar

## 2023-02-03 LAB — CYTOLOGY - PAP
Chlamydia: NEGATIVE
Comment: NEGATIVE
Comment: NEGATIVE
Comment: NEGATIVE
Comment: NORMAL
Diagnosis: NEGATIVE
High risk HPV: NEGATIVE
Neisseria Gonorrhea: NEGATIVE
Trichomonas: NEGATIVE

## 2023-02-04 ENCOUNTER — Other Ambulatory Visit: Payer: Self-pay | Admitting: *Deleted

## 2023-02-04 DIAGNOSIS — E785 Hyperlipidemia, unspecified: Secondary | ICD-10-CM

## 2023-03-13 ENCOUNTER — Telehealth: Payer: Self-pay | Admitting: Physician Assistant

## 2023-03-13 NOTE — Telephone Encounter (Signed)
ERROR

## 2023-04-01 ENCOUNTER — Ambulatory Visit (INDEPENDENT_AMBULATORY_CARE_PROVIDER_SITE_OTHER): Payer: Medicaid Other | Admitting: Physician Assistant

## 2023-04-01 ENCOUNTER — Ambulatory Visit (INDEPENDENT_AMBULATORY_CARE_PROVIDER_SITE_OTHER)
Admission: RE | Admit: 2023-04-01 | Discharge: 2023-04-01 | Disposition: A | Discharge status: Home / Self Care | Payer: Medicaid Other | Source: Ambulatory Visit | Attending: Physician Assistant | Admitting: Physician Assistant

## 2023-04-01 ENCOUNTER — Encounter: Payer: Self-pay | Admitting: Physician Assistant

## 2023-04-01 VITALS — BP 134/88 | HR 95 | Temp 97.8°F | Ht 65.0 in | Wt 134.2 lb

## 2023-04-01 DIAGNOSIS — R1011 Right upper quadrant pain: Secondary | ICD-10-CM

## 2023-04-01 NOTE — Patient Instructions (Addendum)
It was great to see you!  Blood work today  To have this done, you can walk in at the [facility] location without a scheduled appointment.  The address is [address] It is across the street from [facility]. [place] are located in the basement.  Hours of operation are M-F 8:30am to 5:00pm.  Please note that they are closed for lunch between 12:30 and 1:00pm.  I will be in touch tomorrow with your results  If any worsening pain, go to the ER  Take care,  Jarold Motto PA-C

## 2023-04-01 NOTE — Progress Notes (Signed)
Carla [name] is a 52 y.o. female here for a new problem.  History of Present Illness:   Chief Complaint  Patient presents with   Abdominal Pain    Pt c/o pain and pulsing sensation mid to upper abdomen on the right side x 1 week, worsening past 2 days with nausea and dizziness.    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   Hyperlipidemia    Hypertension    Stroke ([facility])    09/2015   Vertigo      Social History   Tobacco Use   Smoking status: Never   Smokeless tobacco: Never  Vaping Use   Vaping Use: Never used  Substance Use Topics   Alcohol use: No   Drug use: No    Past Surgical History:  Procedure Laterality Date   RIGHT ARM SURGERY     TEE WITHOUT CARDIOVERSION N/A 10/13/2015   Procedure: TRANSESOPHAGEAL ECHOCARDIOGRAM (TEE);  Surgeon: Lewayne Bunting, MD;  Location: [facility];  Service: Cardiovascular;  Laterality: N/A;    Family History  Problem Relation Age of Onset   Leukemia Mother    Colon cancer Brother    Anxiety disorder  Brother    Asthma Neg Hx    Cancer Neg Hx    Diabetes Neg Hx    Hyperlipidemia Neg Hx    Heart failure Neg Hx    Hypertension Neg Hx    Migraines Neg Hx    Rashes / Skin problems Neg Hx    Seizures Neg Hx    Stroke Neg Hx    Thyroid disease Neg Hx     Allergies  Allergen Reactions   Pravastatin Anxiety   Tramadol Nausea Only    Current Medications:   Current Outpatient Medications:    amitriptyline (ELAVIL) 25 MG tablet, TOME UNA TABLETA TODOS LOS DIAS AL ACOSTARSE, Disp: 90 tablet, Rfl: 3   aspirin EC 81 MG tablet, Take 81 mg by mouth daily. Swallow whole., Disp: , Rfl:    fenofibrate (TRICOR) 145 MG tablet, Take 1 tablet (145 mg total) by mouth daily., Disp: 90 tablet, Rfl: 1   meclizine (ANTIVERT) 25 MG tablet, Take 1 tablet (25 mg total) by mouth 2 (two) times daily as needed for dizziness., Disp: 60 tablet, Rfl: 5   metoprolol tartrate (LOPRESSOR) 25 MG tablet, TAKE 1/2 TABLET POR VIA ORAL DOS VECES AL DIA, Disp: 90 tablet, Rfl: 1   triamcinolone cream (KENALOG) 0.1 %, Apply to affected area 1-2 times daily, Disp: 30 g, Rfl: 5  Review of Systems:   Review of Systems  Gastrointestinal:  Positive for abdominal pain.   Negative unless otherwise specified per HPI.  Vitals:   Vitals:   04/01/23 1524  BP: 134/88  Pulse: 95  Temp: 97.8 F (36.6 C)  TempSrc: Temporal  SpO2: 97%  Weight: 134 lb 4 oz (60.9 kg)  Height: 5\' 5"  (1.651 m)     Body mass index is 22.34 kg/m.  Physical Exam:   Physical Exam Vitals and nursing note reviewed.  Constitutional:      General: She is not in acute distress.    Appearance: She is well-developed. She is not ill-appearing or toxic-appearing.  Cardiovascular:     Rate and Rhythm: Normal rate and regular rhythm.     Pulses: Normal pulses.     Heart sounds: Normal heart sounds, S1 normal and S2 normal.  Pulmonary:     Effort: Pulmonary effort is normal.     Breath sounds: Normal breath sounds.  Abdominal:     General:  Abdomen is flat. Bowel sounds are normal.     Palpations: Abdomen is soft.     Tenderness: There is abdominal tenderness in the periumbilical area. There is no right CVA tenderness, left CVA tenderness, guarding or rebound. Negative signs include Murphy's sign and McBurney's sign.  Skin:    General: Skin is warm and dry.  Neurological:     Mental Status: She is alert.     GCS: GCS eye subscore is 4. GCS verbal subscore is 5. GCS motor subscore is 6.  Psychiatric:        Speech: Speech normal.        Behavior: Behavior normal. Behavior is cooperative.     Assessment and Plan:   Right upper quadrant abdominal pain No red flags on exam No evidence of acute abdomen on my exam Suspect possible constipation vs MUSCULOSKELETAL related symptom(s) vs gallbladder etiology vs other Recommend blood work, UA and KUB If worsening symptom(s), was instructed to go to the ER If symptoms do not improve or are unexplained by labs or xray, will obtain CT   I spent a total of 39 minutes on this visit, today 04/01/23, which included ordering tests, discussing plan of care with patient and using shared-decision making on next steps, and documenting the findings in the note.   Jarold Motto, PA-C

## 2023-04-02 ENCOUNTER — Ambulatory Visit: Payer: Medicaid Other | Admitting: Physician Assistant

## 2023-04-02 LAB — COMPREHENSIVE METABOLIC PANEL
ALT: 43 U/L — ABNORMAL HIGH (ref 0–35)
AST: 27 U/L (ref 0–37)
Albumin: 4.2 g/dL (ref 3.5–5.2)
Alkaline Phosphatase: 87 U/L (ref 39–117)
BUN: 12 mg/dL (ref 6–23)
CO2: 31 mEq/L (ref 19–32)
Calcium: 9.5 mg/dL (ref 8.4–10.5)
Chloride: 103 mEq/L (ref 96–112)
Creatinine, Ser: 0.82 mg/dL (ref 0.40–1.20)
GFR: 82.24 mL/min (ref >60.00)
Glucose, Bld: 91 mg/dL (ref 70–99)
Potassium: 4 mEq/L (ref 3.5–5.1)
Sodium: 142 mEq/L (ref 135–145)
Total Bilirubin: 0.6 mg/dL (ref 0.2–1.2)
Total Protein: 6.9 g/dL (ref 6.0–8.3)

## 2023-04-02 LAB — CBC WITH DIFFERENTIAL/PLATELET
Basophils Absolute: 0.1 10*3/uL (ref 0.0–0.1)
Basophils Relative: 1.2 % (ref 0.0–3.0)
Eosinophils Absolute: 0.1 10*3/uL (ref 0.0–0.7)
Eosinophils Relative: 2 % (ref 0.0–5.0)
HCT: 38.5 % (ref 36.0–46.0)
Hemoglobin: 12.9 g/dL (ref 12.0–15.0)
Lymphocytes Relative: 37.3 % (ref 12.0–46.0)
Lymphs Abs: 2.2 10*3/uL (ref 0.7–4.0)
MCHC: 33.6 g/dL (ref 30.0–36.0)
MCV: 88.1 fl (ref 78.0–100.0)
Monocytes Absolute: 0.4 10*3/uL (ref 0.1–1.0)
Monocytes Relative: 6.2 % (ref 3.0–12.0)
Neutro Abs: 3.1 10*3/uL (ref 1.4–7.7)
Neutrophils Relative %: 53.3 % (ref 43.0–77.0)
Platelets: 358 10*3/uL (ref 150.0–400.0)
RBC: 4.37 Mil/uL (ref 3.87–5.11)
RDW: 13.5 % (ref 11.5–15.5)
WBC: 5.9 10*3/uL (ref 4.0–10.5)

## 2023-04-02 LAB — URINALYSIS, ROUTINE W REFLEX MICROSCOPIC
Bilirubin Urine: NEGATIVE
Ketones, ur: NEGATIVE
Leukocytes,Ua: NEGATIVE
Nitrite: NEGATIVE
Specific Gravity, Urine: 1.015 (ref 1.000–1.030)
Total Protein, Urine: NEGATIVE
Urine Glucose: NEGATIVE
Urobilinogen, UA: 0.2 (ref 0.0–1.0)
pH: 6 (ref 5.0–8.0)

## 2023-04-02 LAB — H. PYLORI ANTIBODY, IGG: H Pylori IgG: NEGATIVE

## 2023-04-02 LAB — LIPASE: Lipase: 41 U/L (ref 11.0–59.0)

## 2023-04-03 ENCOUNTER — Other Ambulatory Visit: Payer: Self-pay

## 2023-04-03 ENCOUNTER — Emergency Department (HOSPITAL_COMMUNITY)
Admission: EM | Admit: 2023-04-03 | Discharge: 2023-04-04 | Disposition: A | Discharge status: Home / Self Care | Payer: Medicaid Other | Attending: Emergency Medicine | Admitting: Emergency Medicine

## 2023-04-03 ENCOUNTER — Telehealth: Payer: Self-pay | Admitting: Physician Assistant

## 2023-04-03 DIAGNOSIS — R109 Unspecified abdominal pain: Secondary | ICD-10-CM | POA: Diagnosis present

## 2023-04-03 DIAGNOSIS — R110 Nausea: Secondary | ICD-10-CM | POA: Diagnosis not present

## 2023-04-03 DIAGNOSIS — Z7982 Long term (current) use of aspirin: Secondary | ICD-10-CM | POA: Diagnosis not present

## 2023-04-03 DIAGNOSIS — R1010 Upper abdominal pain, unspecified: Secondary | ICD-10-CM | POA: Insufficient documentation

## 2023-04-03 LAB — URINALYSIS, ROUTINE W REFLEX MICROSCOPIC
Bilirubin Urine: NEGATIVE
Glucose, UA: NEGATIVE mg/dL
Hgb urine dipstick: NEGATIVE
Ketones, ur: NEGATIVE mg/dL
Leukocytes,Ua: NEGATIVE
Nitrite: NEGATIVE
Protein, ur: NEGATIVE mg/dL
Specific Gravity, Urine: 1.008 (ref 1.005–1.030)
pH: 7 (ref 5.0–8.0)

## 2023-04-03 LAB — COMPREHENSIVE METABOLIC PANEL
ALT: 36 U/L (ref 0–44)
AST: 27 U/L (ref 15–41)
Albumin: 3.6 g/dL (ref 3.5–5.0)
Alkaline Phosphatase: 85 U/L (ref 38–126)
Anion gap: 7 (ref 5–15)
BUN: 11 mg/dL (ref 6–20)
CO2: 31 mmol/L (ref 22–32)
Calcium: 9.5 mg/dL (ref 8.9–10.3)
Chloride: 102 mmol/L (ref 98–111)
Creatinine, Ser: 0.95 mg/dL (ref 0.44–1.00)
GFR, Estimated: >60 mL/min (ref >60)
Glucose, Bld: 97 mg/dL (ref 70–99)
Potassium: 4 mmol/L (ref 3.5–5.1)
Sodium: 140 mmol/L (ref 135–145)
Total Bilirubin: 0.6 mg/dL (ref 0.3–1.2)
Total Protein: 6.6 g/dL (ref 6.5–8.1)

## 2023-04-03 LAB — CBC
HCT: 39 % (ref 36.0–46.0)
Hemoglobin: 12.4 g/dL (ref 12.0–15.0)
MCH: 28.9 pg (ref 26.0–34.0)
MCHC: 31.8 g/dL (ref 30.0–36.0)
MCV: 90.9 fL (ref 80.0–100.0)
Platelets: 351 10*3/uL (ref 150–400)
RBC: 4.29 MIL/uL (ref 3.87–5.11)
RDW: 12.8 % (ref 11.5–15.5)
WBC: 8.2 10*3/uL (ref 4.0–10.5)
nRBC: 0 % (ref 0.0–0.2)

## 2023-04-03 LAB — LIPASE, BLOOD: Lipase: 47 U/L (ref 11–51)

## 2023-04-03 NOTE — ED Provider Triage Note (Signed)
Emergency Medicine Provider Triage Evaluation Note  Carla [name] , a 52 y.o. female  was evaluated in triage.  Pt complains of right sided abdominal pain. Going on for about a week, worse in the last 3 days. 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 Tried tylenol without relief. No hx of abd surgeries. Last BM earlier this afternoon, reports smaller amount than normal  Review of Systems  Positive: Abd pain, fatigue, chills, nausea, dizziness, urinary frequency Negative: Fever, dysuria, hematuria, diarrhea  Physical Exam  BP (!) 142/95 (BP Location: Right Arm)   Pulse 87   Temp 98.3 F (36.8 C)   Resp 19   LMP 01/16/2020   SpO2 99%  Gen:   Awake, no distress   Resp:  Normal effort  MSK:   Moves extremities without difficulty  Other:    Medical Decision Making  Medically screening exam initiated at 8:43 PM.  Appropriate orders placed.  Carla [name] was informed that the remainder of the evaluation will be completed by another provider, this initial triage assessment does not replace that evaluation, and the importance of remaining in the ED until their evaluation is complete.  Workup initiated   Carla [name] T, PA-C 04/03/23 2048

## 2023-04-03 NOTE — Telephone Encounter (Signed)
Pt would like a call back with imaging and lab results.

## 2023-04-03 NOTE — ED Triage Notes (Addendum)
Pt c/o right sided abdominal pain associated with feeling full. Pain associated with fatigue, chills, nausea, dizziness, and urinary frequency. No hx abd surgeries.   Pt requires spanish speaking interpreter.

## 2023-04-04 ENCOUNTER — Emergency Department (HOSPITAL_COMMUNITY): Payer: Medicaid Other

## 2023-04-04 MED ORDER — PANTOPRAZOLE SODIUM 40 MG PO TBEC: 40.0000 mg | DELAYED_RELEASE_TABLET | Freq: Every day | ORAL | 3 refills | Status: DC

## 2023-04-04 MED ORDER — ONDANSETRON 4 MG PO TBDP: ORAL_TABLET | ORAL | 0 refills | Status: DC

## 2023-04-04 MED ORDER — DICYCLOMINE HCL 20 MG PO TABS: 20.0000 mg | ORAL_TABLET | Freq: Three times a day (TID) | ORAL | 0 refills | Status: DC

## 2023-04-04 MED ORDER — IOHEXOL 350 MG/ML SOLN
75.0000 mL | Freq: Once | INTRAVENOUS | Status: AC | PRN
  Administered 2023-04-04: 75 mL via INTRAVENOUS

## 2023-04-04 NOTE — ED Provider Notes (Signed)
[place] EMERGENCY DEPARTMENT AT [facility] Provider Note   CSN: [id number] Arrival date & time: 04/03/23  2020     History  Chief Complaint  Patient presents with   Abdominal Pain    Carla [name] is a 52 y.o. female.  Patient presents to the emergency department for evaluation of abdominal pain.  Patient reports that she has been feeling poorly for about a week.  Pain is predominantly in the right mid abdominal area.  She has nausea but has not vomited.  She reports that she feels like she has been sitting on the toilet a lot, only has small bowel movements.  Has been eating normally but feels like her stomach gets inflamed after she eats.  No rectal bleeding or melena.       Home Medications Prior to Admission medications   Medication Sig Start Date End Date Taking? Authorizing Provider  dicyclomine (BENTYL) 20 MG tablet Take 1 tablet (20 mg total) by mouth 3 (three) times daily before meals. 04/04/23  Yes Gilda Crease, MD  ondansetron (ZOFRAN-ODT) 4 MG disintegrating tablet 4mg  ODT q4 hours prn nausea/vomit 04/04/23  Yes Zalen Sequeira, Canary Brim, MD  pantoprazole (PROTONIX) 40 MG tablet Take 1 tablet (40 mg total) by mouth daily. 04/04/23  Yes Cydney Alvarenga, Canary Brim, MD  amitriptyline (ELAVIL) 25 MG tablet TOME UNA TABLETA TODOS LOS DIAS AL ACOSTARSE 06/24/22   Ihor Austin, NP  aspirin EC 81 MG tablet Take 81 mg by mouth daily. Swallow whole.    [provider]  fenofibrate (TRICOR) 145 MG tablet Take 1 tablet (145 mg total) by mouth daily. 08/07/22   Jarold Motto, PA  meclizine (ANTIVERT) 25 MG tablet Take 1 tablet (25 mg total) by mouth 2 (two) times daily as needed for dizziness. 04/10/22   Ihor Austin, NP  metoprolol tartrate (LOPRESSOR) 25 MG tablet TAKE 1/2 TABLET POR VIA ORAL DOS VECES AL DIA 09/02/22   Jarold Motto, PA  triamcinolone cream (KENALOG) 0.1 % Apply to affected area 1-2 times daily 12/18/22   Jarold Motto, PA       Allergies    Pravastatin and Tramadol    Review of Systems   Review of Systems  Physical Exam Updated Vital Signs BP 127/85 (BP Location: Right Arm)   Pulse 73   Temp (!) 97.5 F (36.4 C) (Oral)   Resp 16   LMP 01/16/2020   SpO2 97%  Physical Exam Vitals and nursing note reviewed.  Constitutional:      General: She is not in acute distress.    Appearance: She is well-developed.  HENT:     Head: Normocephalic and atraumatic.     Mouth/Throat:     Mouth: Mucous membranes are moist.  Eyes:     General: Vision grossly intact. Gaze aligned appropriately.     Extraocular Movements: Extraocular movements intact.     Conjunctiva/sclera: Conjunctivae normal.  Cardiovascular:     Rate and Rhythm: Normal rate and regular rhythm.     Pulses: Normal pulses.     Heart sounds: Normal heart sounds, S1 normal and S2 normal. No murmur heard.    No friction rub. No gallop.  Pulmonary:     Effort: Pulmonary effort is normal. No respiratory distress.     Breath sounds: Normal breath sounds.  Abdominal:     General: Bowel sounds are normal.     Palpations: Abdomen is soft.     Tenderness: There is abdominal tenderness. There is no guarding or  rebound.     Hernia: No hernia is present.    Musculoskeletal:        General: No swelling.     Cervical back: Full passive range of motion without pain, normal range of motion and neck supple. No spinous process tenderness or muscular tenderness. Normal range of motion.     Right lower leg: No edema.     Left lower leg: No edema.  Skin:    General: Skin is warm and dry.     Capillary Refill: Capillary refill takes less than 2 seconds.     Findings: No ecchymosis, erythema, rash or wound.  Neurological:     General: No focal deficit present.     Mental Status: She is alert and oriented to person, place, and time.     GCS: GCS eye subscore is 4. GCS verbal subscore is 5. GCS motor subscore is 6.     Cranial Nerves: Cranial nerves 2-12 are  intact.     Sensory: Sensation is intact.     Motor: Motor function is intact.     Coordination: Coordination is intact.  Psychiatric:        Attention and Perception: Attention normal.        Mood and Affect: Mood normal.        Speech: Speech normal.        Behavior: Behavior normal.     ED Results / Procedures / Treatments   Labs (all labs ordered are listed, but only abnormal results are displayed) Labs Reviewed  URINALYSIS, ROUTINE W REFLEX MICROSCOPIC - Abnormal; Notable for the following components:      Result Value   Color, Urine STRAW (*)    All other components within normal limits  LIPASE, BLOOD  COMPREHENSIVE METABOLIC PANEL  CBC    EKG None  Radiology CT ABDOMEN PELVIS W CONTRAST  Result Date: 04/04/2023 CLINICAL DATA:  Right-sided abdominal pain for a week. EXAM: CT ABDOMEN AND PELVIS WITH CONTRAST TECHNIQUE: Multidetector CT imaging of the abdomen and pelvis was performed using the standard protocol following bolus administration of intravenous contrast. RADIATION DOSE REDUCTION: This exam was performed according to the departmental dose-optimization program which includes automated exposure control, adjustment of the mA and/or kV according to patient size and/or use of iterative reconstruction technique. CONTRAST:  75mL OMNIPAQUE IOHEXOL 350 MG/ML SOLN COMPARISON:  12/30/2022. FINDINGS: Lower chest: Atelectasis is present at the lung bases. Hepatobiliary: There is a large hypodense lesion in the posterior right lobe of the liver measuring 10.1 x 6.7 cm with peripheral puddling of contrast suggesting hemangioma. Hypodense lesion with peripheral puddling of contrast is noted in the anterior right lobe of the liver measuring 5.9 x 4.8 cm with peripheral puddling of contents, likely hemangioma. No biliary ductal dilatation. The gallbladder is without stones. Pancreas: Unremarkable. No pancreatic ductal dilatation or surrounding inflammatory changes. Spleen: Normal in size  without focal abnormality. Adrenals/Urinary Tract: The adrenal glands are within normal limits. The kidneys enhance symmetrically. No ureteral calculus or obstructive uropathy. The bladder is unremarkable. Stomach/Bowel: Stomach is within normal limits. Appendix appears normal. No evidence of bowel wall thickening, distention, or inflammatory changes. No free air or pneumatosis. A moderate amount of retained stool is present in the colon. There is fecalization of the distal ileum which may be related to slow transit. Vascular/Lymphatic: No significant vascular findings are present. No enlarged abdominal or pelvic lymph nodes. Reproductive: Uterus and bilateral adnexa are unremarkable. Other: No abdominopelvic ascites. Fat containing umbilical hernia  is present. Musculoskeletal: Mild degenerative changes in the thoracolumbar spine. No acute osseous abnormality. IMPRESSION: 1. No acute intra-abdominal process. 2. Stable hepatic hemangiomas. Electronically Signed   By: Thornell Sartorius M.D.   On: 04/04/2023 02:35    Procedures Procedures    Medications Ordered in ED Medications  iohexol (OMNIPAQUE) 350 MG/ML injection 75 mL (75 mLs Intravenous Contrast Given 04/04/23 0228)    ED Course/ Medical Decision Making/ A&P                             Medical Decision Making Amount and/or Complexity of Data Reviewed Radiology: ordered.  Risk Prescription drug management.   Differential Diagnosis considered includes, but not limited to: Cholelithiasis; cholecystitis; cholangitis; bowel obstruction; esophagitis; gastritis; peptic ulcer disease; pancreatitis.   Presents with right-sided abdominal pain.  Exam is fairly benign, some mild tenderness.  No tenderness at McBurney's point.  Negative Murphy sign.  Patient underwent lab work which was reassuring.  Patient had a CT scan that does not show any acute abnormality.  Patient does not have any indication for admission, will treat symptomatically and arrange  for outpatient follow-up with GI.        Final Clinical Impression(s) / ED Diagnoses Final diagnoses:  Pain of upper abdomen    Rx / DC Orders ED Discharge Orders          Ordered    dicyclomine (BENTYL) 20 MG tablet  3 times daily before meals        04/04/23 0300    pantoprazole (PROTONIX) 40 MG tablet  Daily        04/04/23 0300    ondansetron (ZOFRAN-ODT) 4 MG disintegrating tablet        04/04/23 0300              Rafan Sanders, Canary Brim, MD 04/04/23 0301

## 2023-04-04 NOTE — Telephone Encounter (Signed)
See result note.  

## 2023-05-07 ENCOUNTER — Telehealth: Payer: Self-pay | Admitting: Adult Health

## 2023-05-07 NOTE — Telephone Encounter (Signed)
Pt reports she is not feeling good in her head 2 days ago and not as bad today.  Pt states she is also experiencing swelling in her face and does not know if that is as a result of medication.  Pt has asked if she can be seen earlier than current appointment.  Pt was asked if she has been to ED re: what she expressed is going on, she said no but will consider going.  Pt would still like a call from RN re: if it is possible for her to be seen earlier.

## 2023-05-07 NOTE — Telephone Encounter (Signed)
Called pt to inquire more information about the symptoms she is having. Pt stated she had an ER visit on 04/03/2023 and she was given Protonix to take. States she only took it for 3 weeks then stopped because she noticed some swelling in her face. Pt was advised to schedule an appt with her PCP Carla [name] or if she needed immediate attention to go back to the emergency room. Pt verbalized understanding.

## 2023-05-09 ENCOUNTER — Other Ambulatory Visit: Payer: Self-pay | Admitting: Physician Assistant

## 2023-05-14 ENCOUNTER — Ambulatory Visit (INDEPENDENT_AMBULATORY_CARE_PROVIDER_SITE_OTHER): Payer: Medicaid Other | Admitting: Family Medicine

## 2023-05-14 ENCOUNTER — Encounter: Payer: Self-pay | Admitting: Family Medicine

## 2023-05-14 VITALS — BP 140/82 | HR 99 | Temp 98.7°F | Ht 65.0 in | Wt 135.6 lb

## 2023-05-14 DIAGNOSIS — H6523 Chronic serous otitis media, bilateral: Secondary | ICD-10-CM | POA: Diagnosis not present

## 2023-05-14 DIAGNOSIS — I10 Essential (primary) hypertension: Secondary | ICD-10-CM

## 2023-05-14 DIAGNOSIS — J309 Allergic rhinitis, unspecified: Secondary | ICD-10-CM | POA: Diagnosis not present

## 2023-05-14 DIAGNOSIS — R42 Dizziness and giddiness: Secondary | ICD-10-CM

## 2023-05-14 MED ORDER — FLUTICASONE PROPIONATE 50 MCG/ACT NA SUSP: 1.0000 | Freq: Every day | NASAL | 6 refills | Status: DC

## 2023-05-14 MED ORDER — CETIRIZINE HCL 10 MG PO TABS: 10.0000 mg | ORAL_TABLET | Freq: Every day | ORAL | 11 refills | Status: DC

## 2023-05-14 NOTE — Patient Instructions (Addendum)
Please follow up if symptoms do not improve or as needed.    Rinitis alrgica en adultos Allergic Rhinitis, Adult  La rinitis alrgica es una reaccin alrgica que afecta la membrana mucosa que se encuentra en la nariz. La membrana mucosa es el tejido que produce mucosidad. Existen dos tipos de rinitis alrgica: Astronomer. A este tipo tambin se le llama "fiebre del heno" y ocurre solo durante ciertas estaciones. Perenne. Este tipo puede ocurrir en cualquier momento del ao. La rinitis alrgica no puede transmitirse de Neomia Dear persona a otra. Esta afeccin puede ser leve, grave o muy grave. Puede aparecer a cualquier edad y se puede superar con los Hiram. Cules son las causas? Esta afeccin es causada por alrgenos. Estas son cosas que pueden causar Runner, broadcasting/film/video. Los alrgenos de la rinitis Merchandiser, retail y de la rinitis alrgica perenne pueden ser diferentes. La rinitis alrgica estacional es causada por el polen. El polen puede provenir de los rboles, el pasto y las [employer]. La rinitis alrgica perenne puede ser causada por: caros del polvo. Protenas en el pis (orina), la saliva o la caspa de [place]. La caspa son las clulas muertas de la piel de [place]. Humo en general, moho o humo de automviles. Restos de insectos, como las cucarachas, o sus excrementos. Qu incrementa el riesgo? Una persona tiene ms probabilidades de presentar esta afeccin si tiene antecedentes familiares de Environmental consultant u otras afecciones relacionadas con las alergias, entre las que se incluyen: Armed forces technical officer. Se trata de irritacin e hinchazn en parte de los ojos y los prpados. Asma. Esta afeccin afecta los pulmones y dificulta la respiracin. Dermatitis atpica o eczema. Se trata de irritacin e hinchazn de la piel a largo plazo (crnica). Alergias a los alimentos. Cules son los signos o sntomas? Los sntomas de esta afeccin incluyen: Tos o estornudos. Taponamiento nasal  (congestin nasal), picazn en la nariz o secrecin nasal. Ojos y lagrimeo en los ojos. Sensacin de mucosidad que gotea por la parte posterior de la garganta (goteo posnasal). Esto puede causar dolor de garganta. Dificultad para dormir. Cansancio. Dolor de [place]. Cmo se diagnostica? Esta afeccin se puede diagnosticar en funcin de los sntomas, los antecedentes mdicos y un examen fsico. El mdico puede controlar si tiene alguna afeccin relacionada, por ejemplo: Asma. Ojo rojo. Se trata de irritacin e hinchazn de los ojos a causa de la infeccin (conjuntivitis). Infeccin en los odos. Infeccin de las vas respiratorias superiores. Esta es una infeccin de la nariz, la garganta o las vas respiratorias superiores. Tambin puede someterse a estudios para Financial risk analyst qu alrgenos causan los sntomas. Por ejemplo, anlisis cutneos y de [place]. Cmo se trata? No hay cura para esta afeccin, pero el tratamiento puede ayudar a [employer] sntomas. El tratamiento puede incluir: Usar medicamentos que [employer] sntomas de la [place], como los corticoesteroides (antiinflamatorios) y los antihistamnicos. Medicamentos que pueden administrarse por inyeccin, aerosol nasal o pldoras. Evitar los alrgenos que corresponda. La exposicin repetida a pequeas cantidades de alrgenos para ayudarlo a generar defensas contra los alrgenos (inmunoterapia con alrgenos). Esto se realiza si otros tratamientos no han sido exitosos. Puede incluir lo siguiente: Agricultural engineer. Se trata de medicamentos inyectables que contienen pequeas cantidades de alrgenos. Inmunoterapia sublingual. Esta implica la administracin de pequeas dosis de un medicamento con alrgenos debajo de la lengua. Si estos tratamientos no funcionan, el mdico puede recetarle medicamentos ms nuevos y ms fuertes. Siga estas instrucciones en su casa: Evite los alrgenos Averige a qu es  alrgico y evite esos alrgenos.  Hay medidas que puede tomar para ayudar a Secretary/administrator: Si tiene Environmental consultant perennes: Reemplace las alfombras por pisos de [place], baldosas o vinilo. Las alfombras pueden retener la caspa o los pelos de los animales y el polvo. No fume. No permita que fumen en su hogar. Cambie los filtros de la calefaccin y del aire acondicionado al menos una vez al mes. Si tiene Theatre manager, siga estos pasos durante la temporada de alergias: Mantenga las ventanas cerradas la mayor cantidad de [place] posible. Planee actividades al aire libre cuando las concentraciones de polen estn en su nivel ms bajo. Fjese en las concentraciones de polen antes de planificar actividades al [place]. Cuando ingrese al interior, [place] de ropa y dese una ducha antes de sentarse en los muebles o la cama. Si tiene [employer] casa que produce alrgenos: Mantenga a la mascota fuera del dormitorio. Pase la aspiradora, barra y limpie el polvo con regularidad. Instrucciones generales Use los medicamentos de venta libre y los recetados solo como se lo haya indicado el mdico. Beba suficiente lquido como para Pharmacologist la orina de color amarillo plido. Dnde buscar ms informacin American Academy of Allergy, Asthma & Immunology (Academia Estadounidense de [place], [place] e Inmunologa): [phone].org Comunquese con un mdico si: Tiene fiebre. Tiene tos que no desaparece. Emite sonidos de silbidos agudos al respirar, ms a menudo al exhalar (sibilancias). Los sntomas lo enlentecen o impiden que haga sus actividades normales [employer]. Solicite ayuda de inmediato si: Le falta el aire. Este sntoma puede Customer service manager. Solicite ayuda de inmediato. Llame al 911. No espere a ver si los sntomas desaparecen. No conduzca por sus propios medios [employer]. Esta informacin no tiene Theme park manager el consejo del mdico. Asegrese de hacerle al mdico cualquier pregunta que  tenga. Document Revised: 08/22/2022 Document Reviewed: 08/22/2022 Elsevier Patient Education  2024 [employer].

## 2023-05-14 NOTE — Progress Notes (Signed)
Subjective  CC:  Chief Complaint  Patient presents with   Ear Pain    Pt stated that she feels unbalanced, feels like ear is clogged and heart racing    Same day acute visit; PCP not available. New pt to me. Chart reviewed.   HPI: Carla [name] is a 52 y.o. female who presents to the office today to address the problems listed above in the chief complaint. 52 year old female with history of hypertension, cryptogenic stroke reports ear pressure for about a week.  Feels like her ears are clogged and sometimes she feels like there is liquid coming from them, however there is no drainage.  No sharp pain.  She has vertigo ongoing for several years and sometimes feels off balance, there is no change in this symptom.  No fevers or chills.  She admits to sneezing and some nasal congestion, some postnasal drainage.  She has no headaches, diplopia, sore throat or coughing.  She reports she had been on medicines for allergies in the past but is no longer taking them.  I reviewed medication list and do not see allergy medications.  Of note she takes meclizine when she feels like her ears are full.  She does not feel that this helps anything. Hypertension: After arriving, she became quite anxious.  Initial blood pressures were very elevated.  Upon rechecking after she calm down, her blood pressures normalized.  She is on a beta-blocker.  Chart review shows fairly well-controlled blood pressure.  Assessment  1. Chronic allergic rhinitis   2. Essential hypertension   3. Bilateral chronic serous otitis media   4. Chronic vertigo      Plan  Allergies and eustachian tube dysfunction with serous otitis: Reassured.  No sign of infection at this time.  Start Flonase and Zyrtec.  Education given. Chronic vertigo: To follow-up with primary care provider.  She is on meclizine as needed.  Education given on appropriate use.  Has not needed for symptoms of ear fullness or pressure.  Only for vertigo. Hypertension:  Continue to monitor.  No med changes made today.  Will follow-up with her primary care doctor as schedule  Follow up: As scheduled 08/01/2023  No orders of the defined types were placed in this encounter.  Meds ordered this encounter  Medications   fluticasone (FLONASE) 50 MCG/ACT nasal spray    Sig: Place 1 spray into both nostrils daily.    Dispense:  16 g    Refill:  6   cetirizine (ZYRTEC) 10 MG tablet    Sig: Take 1 tablet (10 mg total) by mouth daily.    Dispense:  30 tablet    Refill:  11      I reviewed the patients updated PMH, FH, and SocHx.    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 Swallow whole.   cetirizine (ZYRTEC) 10 MG  tablet Take 1 tablet (10 mg total) by mouth daily.   dicyclomine (BENTYL) 20 MG tablet Take 1 tablet (20 mg total) by mouth 3 (three) times daily before meals.   fenofibrate (TRICOR) 145 MG tablet Take 1 tablet (145 mg total) by mouth daily.   fluticasone (FLONASE) 50 MCG/ACT nasal spray Place 1 spray into both nostrils daily.   meclizine (ANTIVERT) 25 MG tablet Take 1 tablet (25 mg total) by mouth 2 (two) times daily as needed for dizziness.   metoprolol tartrate (LOPRESSOR) 25 MG tablet TAKE 1/2 TABLET POR VIA ORAL DOS VECES AL DIA   ondansetron (ZOFRAN-ODT) 4 MG disintegrating tablet 4mg  ODT q4 hours prn nausea/vomit   pantoprazole  (PROTONIX) 40 MG tablet Take 1 tablet (40 mg total) by mouth daily.   triamcinolone cream (KENALOG) 0.1 % Apply to affected area 1-2 times daily    Allergies: Patient is allergic to pravastatin and tramadol. Family History: Patient family history includes Anxiety disorder in her brother; Colon cancer in her brother; Leukemia in her mother. Social History:  Patient  reports that she has never smoked. She has never used smokeless tobacco. She reports that she does not drink alcohol and does not use drugs.  Review of Systems: Constitutional: Negative for fever malaise or anorexia Cardiovascular: negative for chest pain Respiratory: negative for SOB or persistent cough Gastrointestinal: negative for abdominal pain  Objective  Vitals: BP (!) 140/82 Comment: rechecked by me, seated left arm/cla  Pulse 99   Temp 98.7 F (37.1 C)   Ht 5\' 5"  (1.651 m)   Wt 135 lb 9.6 oz (61.5 kg)   LMP 01/16/2020   SpO2 99%   BMI 22.57 kg/m  General: no acute distress , A&Ox3, appears well HEENT: PEERL, conjunctiva normal, neck is supple, bilateral TMs with normal landmarks and serous effusions.  No erythema, nasal mucosa inflamed, oropharynx clear Cardiovascular:  RRR without murmur or gallop.  Respiratory:  Good breath sounds bilaterally, CTAB with normal respiratory effort Skin:  Warm, no rashes  Commons side effects, risks, benefits, and alternatives for medications and treatment plan prescribed today were discussed, and the patient expressed understanding of the given instructions. Patient is instructed to call or message via MyChart if he/she has any questions or concerns regarding our treatment plan. No barriers to understanding were identified. We discussed Red Flag symptoms and signs in detail. Patient expressed understanding regarding what to do in case of urgent or emergency type symptoms.  Medication list was reconciled, printed and provided to the patient in AVS. Patient instructions and  summary information was reviewed with the patient as documented in the AVS. This note was prepared with assistance of Dragon voice recognition software. Occasional wrong-word or sound-a-like substitutions may have occurred due to the inherent limitations of voice recognition software

## 2023-05-27 ENCOUNTER — Encounter: Payer: Self-pay | Admitting: Physician Assistant

## 2023-05-27 ENCOUNTER — Ambulatory Visit (INDEPENDENT_AMBULATORY_CARE_PROVIDER_SITE_OTHER): Payer: Medicaid Other | Admitting: Physician Assistant

## 2023-05-27 ENCOUNTER — Telehealth: Payer: Self-pay | Admitting: Physician Assistant

## 2023-05-27 VITALS — BP 140/90 | HR 98 | Temp 98.2°F | Ht 65.0 in | Wt 137.4 lb

## 2023-05-27 DIAGNOSIS — I10 Essential (primary) hypertension: Secondary | ICD-10-CM

## 2023-05-27 DIAGNOSIS — R220 Localized swelling, mass and lump, head: Secondary | ICD-10-CM

## 2023-05-27 DIAGNOSIS — R5383 Other fatigue: Secondary | ICD-10-CM

## 2023-05-27 DIAGNOSIS — R42 Dizziness and giddiness: Secondary | ICD-10-CM | POA: Diagnosis not present

## 2023-05-27 LAB — COMPREHENSIVE METABOLIC PANEL
ALT: 32 U/L (ref 0–35)
AST: 23 U/L (ref 0–37)
Albumin: 4.3 g/dL (ref 3.5–5.2)
Alkaline Phosphatase: 87 U/L (ref 39–117)
BUN: 12 mg/dL (ref 6–23)
CO2: 31 mEq/L (ref 19–32)
Calcium: 10.2 mg/dL (ref 8.4–10.5)
Chloride: 103 mEq/L (ref 96–112)
Creatinine, Ser: 0.77 mg/dL (ref 0.40–1.20)
GFR: 88.59 mL/min (ref >60.00)
Glucose, Bld: 99 mg/dL (ref 70–99)
Potassium: 5.1 mEq/L (ref 3.5–5.1)
Sodium: 140 mEq/L (ref 135–145)
Total Bilirubin: 0.6 mg/dL (ref 0.2–1.2)
Total Protein: 7.2 g/dL (ref 6.0–8.3)

## 2023-05-27 LAB — CBC
HCT: 40.9 % (ref 36.0–46.0)
Hemoglobin: 13.4 g/dL (ref 12.0–15.0)
MCHC: 32.8 g/dL (ref 30.0–36.0)
MCV: 89.9 fl (ref 78.0–100.0)
Platelets: 365 10*3/uL (ref 150.0–400.0)
RBC: 4.55 Mil/uL (ref 3.87–5.11)
RDW: 13.7 % (ref 11.5–15.5)
WBC: 6.1 10*3/uL (ref 4.0–10.5)

## 2023-05-27 LAB — VITAMIN B12: Vitamin B-12: 400 pg/mL (ref 211–911)

## 2023-05-27 LAB — TSH: TSH: 2.36 u[IU]/mL (ref 0.35–5.50)

## 2023-05-27 NOTE — Telephone Encounter (Signed)
FYI: This call has been transferred to triage nurse: the Triage Nurse. Once the result note has been entered staff can address the message at that time.  Patient called in with the following symptoms:  Red Word:dizziness , imbalanced   Please advise at Mobile [phone] (mobile)  Message is routed to Provider Pool.

## 2023-05-27 NOTE — Patient Instructions (Signed)
It was great to see you!  I have placed urgent referral for vestibular rehab: [facility] [address] [place],  [place]  [address] Get Driving Directions Main: [phone]  They will call you.  If you start to feel worse in the meantime -- GO TO THE ER  Take care,  Jarold Motto PA-C

## 2023-05-27 NOTE — Telephone Encounter (Signed)
Patient advised Call EMS Now  Patient Name First: Carla Last: [name] Gender: Female DOB: 12-01-1970 Age: 52 Y 6 M 28 D Return Phone Number: [phone] (Primary) Address: City/ State/ Zip: [place]  [address] Client  [facility] - Administrator, [facility] Provider Bufford Buttner, [place]- [place] Contact Type Call Who Is Calling Patient / Member / Family / Caregiver Call Type Triage / Clinical Caller Name Bonita Quin Relationship To Patient Other Return Phone Number [phone] (Primary) Chief Complaint Dizziness Reason for Call Symptomatic / Request for Health Information Initial Comment Caller states pt is dizzy and feels like she may faint. Translation No Nurse Assessment Nurse: Leonie Douglas, RN, Ashlynn Date/Time (Eastern Time): 05/27/2023 9:13:31 AM Confirm and document reason for call. If symptomatic, describe symptoms. ---Caller states pt is dizzy and feels like she may faint 1hour ago. Does the patient have any new or worsening symptoms? ---Yes Will a triage be completed? ---Yes Related visit to physician within the last 2 weeks? ---Yes Does the PT have any chronic conditions? (i.e. diabetes, asthma, this includes High risk factors for pregnancy, etc.) ---No Is the patient pregnant or possibly pregnant? (Ask all females between the ages of 105-55) ---No Is this a behavioral health or substance abuse call? ---No Guidelines Guideline Title Affirmed Question Affirmed Notes Nurse Date/Time (Eastern Time) Dizziness - Lightheadedness SEVERE difficulty breathing (e.g., struggling for each breath, speaks in single words) Leonie Douglas, RN, Ashlynn 05/27/2023 9:14:28 AM  Disp. Time Lamount Cohen Time) Disposition Final User 05/27/2023 9:19:17 AM Call EMS 911 Now Yes Leonie Douglas, RN, Ashlynn 05/27/2023 9:20:30 AM 911 Outcome Documentation Leonie Douglas, RN, Ashlynn Reason: caller wants to call doctors office and make an appointment to  be seen. Final Disposition 05/27/2023 9:19:17 AM Call EMS 911 Now Yes Leonie Douglas, RN, Ashlynn Caller Disagree/Comply Disagree Caller Understands No PreDisposition Call Doctor Care Advice Given Per Guideline CALL EMS 911 NOW: * Immediate medical attention is needed. You need to hang up and call 911 (or an ambulance). * Triager Discretion: I'll call you back in a few minutes to be sure you were able to reach them. CARE ADVICE given per Dizziness (Adult) guideline.   Comments User: Donell Beers, RN Date/Time Lamount Cohen Time): 05/27/2023 9:20:16 AM caller wants to call doctors office and make an appointment to be seen.

## 2023-05-27 NOTE — Progress Notes (Signed)
Carla [name] is a 52 y.o. female here for a new problem.  History of Present Illness:   Chief Complaint  Patient presents with   Otalgia    Pt c/o bilateral ears itching, feels like fluid in them. Pt says the right side of her cheek is swollen sometimes.   Dizziness    Pt c/o dizziness x 1 month, feels off balanced.    HPI  She is present with her friend to help with communication and translation.   She is here with numerous complaints.  Face swelling: She also complains of right side face swelling after working for 2-3 hours each day. Goes away by the end of the day. Not daily occurrence. Her friend can see visible swelling of her face when this occurs. She denies numbness or tingling on her face.  She does not have swelling in her face at this time. Does not feel like she has issue with food allergies.  Dizziness/fatigue:  She complains of dizziness and fatigue.  She also has sensation of loss of balance.  Her friend reports she loses her balance at work often but has not fallen.  She has seen another provider for these symptoms and was given zyrtec and nasal spray.  She found her symptoms worsened after taking the nasal spray so she stopped but she continues taking zyrtec.  She also has blurred vision when reading and occasionally when looking at peoples faces.  Her last vision care exam was 2 years ago.  She has an upcoming appointment with her neurologist in 2 weeks.  She seen an emergency room due to constipation and was given bentyl, Protonix, and Zofran to manage her symptoms and noticed her symptoms started since then.  She is drinking plenty of water throughout the day and is not skipping meals.  Has not fallen.  HTN Currently taking metoprolol 12.5 mg. At home blood pressure readings are: not checked. Patient denies chest pain, SOB, blurred vision, dizziness, unusual headaches, lower leg swelling. Patient is compliant with medication. Denies excessive caffeine  intake, stimulant usage, excessive alcohol intake, or increase in salt consumption.  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   Hyperlipidemia    Hypertension    Stroke (HCC)    09/2015   Vertigo      Social History   Tobacco Use   Smoking status: Never   Smokeless tobacco: Never  Vaping Use   Vaping Use: Never used  Substance Use Topics   Alcohol use: No   Drug use: No    Past Surgical History:  Procedure Laterality Date   RIGHT ARM SURGERY     TEE WITHOUT CARDIOVERSION N/A 10/13/2015   Procedure: TRANSESOPHAGEAL ECHOCARDIOGRAM (TEE);  Surgeon: Lewayne Bunting, MD;  Location: [facility] END[facility];  Service: Cardiovascular;  Laterality: N/A;    Family History  Problem Relation Age of Onset   Leukemia Mother    Colon cancer Brother    Anxiety disorder Brother    Asthma Neg Hx    Cancer Neg Hx    Diabetes Neg Hx    Hyperlipidemia Neg Hx    Heart failure Neg Hx    Hypertension Neg Hx    Migraines Neg Hx    Rashes / Skin problems Neg Hx    Seizures  Neg Hx    Stroke Neg Hx    Thyroid disease Neg Hx     Allergies  Allergen Reactions   Pravastatin Anxiety   Tramadol Nausea Only    Current Medications:   Current Outpatient Medications:    amitriptyline (ELAVIL) 25 MG tablet, TOME UNA TABLETA TODOS LOS DIAS AL ACOSTARSE, Disp: 90 tablet, Rfl: 3   aspirin EC 81 MG tablet, Take 81 mg by mouth daily. Swallow whole., Disp: , Rfl:    cetirizine (ZYRTEC) 10 MG tablet, Take 1 tablet (10 mg total) by mouth daily., Disp: 30 tablet, Rfl: 11   fenofibrate (TRICOR) 145 MG tablet, Take 1 tablet (145 mg total) by mouth daily., Disp: 90 tablet, Rfl: 1   metoprolol tartrate (LOPRESSOR) 25 MG tablet, TAKE 1/2 TABLET POR VIA ORAL DOS VECES AL DIA, Disp: 90 tablet, Rfl: 1   triamcinolone cream (KENALOG) 0.1 %,  Apply to affected area 1-2 times daily, Disp: 30 g, Rfl: 5   dicyclomine (BENTYL) 20 MG tablet, Take 1 tablet (20 mg total) by mouth 3 (three) times daily before meals. (Patient not taking: Reported on 05/27/2023), Disp: 30 tablet, Rfl: 0   meclizine (ANTIVERT) 25 MG tablet, Take 1 tablet (25 mg total) by mouth 2 (two) times daily as needed for dizziness. (Patient not taking: Reported on 05/27/2023), Disp: 60 tablet, Rfl: 5   pantoprazole (PROTONIX) 40 MG tablet, Take 1 tablet (40 mg total) by mouth daily. (Patient not taking: Reported on 05/27/2023), Disp: 30 tablet, Rfl: 3   Review of Systems:   Review of Systems  Constitutional:  Positive for malaise/fatigue.  HENT:         (+)fluid in both ears  Musculoskeletal:        (+)decreased balance while standing or walking  Neurological:  Positive for dizziness, speech change (slurring speach) and headaches. Negative for tingling.    Vitals:   Vitals:   05/27/23 1025 05/27/23 1048  BP: (!) 152/90 (!) 140/90  Pulse: 98   Temp: 98.2 F (36.8 C)   TempSrc: Temporal   SpO2: 95%   Weight: 137 lb 6.1 oz (62.3 kg)   Height: 5\' 5"  (1.651 m)      Body mass index is 22.86 kg/m.  Physical Exam:   Physical Exam Vitals and nursing note reviewed.  Constitutional:      General: She is not in acute distress.    Appearance: She is well-developed. She is not ill-appearing or toxic-appearing.  Cardiovascular:     Rate and Rhythm: Normal rate and regular rhythm.     Pulses: Normal pulses.     Heart sounds: Normal heart sounds, S1 normal and S2 normal.  Pulmonary:     Effort: Pulmonary effort is normal.     Breath sounds: Normal breath sounds.  Skin:    General: Skin is warm and dry.  Neurological:     General: No focal deficit present.     Mental Status: She is alert.     GCS: GCS eye subscore is 4. GCS verbal subscore is 5. GCS motor subscore is 6.     Cranial Nerves: Cranial nerves 2-12 are intact.     Sensory: Sensation is intact.      Motor: Motor function is intact.     Coordination: Coordination is intact.     Gait: Gait is intact.  Psychiatric:        Speech: Speech normal.        Behavior: Behavior normal. Behavior is cooperative.  Assessment and Plan:   Fatigue, unspecified type Unclear etiology Will update blood work per patient request and continue to monitor her symptoms We have discussed contributing anxiety in the past, continue to monitor this as well If acute worsening, will need to go to the ER  Vertigo Chronic issue Neuro exam is WNL Referral to vestibular rehab for evaluation If any new/worsening or persistent symptom(s), recommend ER or have asked her to try to get into neurology sooner  Swelling of face No symptom(s) on my exam Unclear etiology Consider ENT referral if persists  Essential hypertension Above goal today No evidence of end-organ damage on my exam Recommend patient monitor home blood pressure at least a few times weekly Continue metoprolol 12.5 mg daily; she is quite resistant to changing or increasing any blood pressure medications If home monitoring shows consistent elevation, or any symptom(s) develop, recommend reach out to [place] for further advice on next steps   [employer] as a scribe for [employer], PA.,have documented all relevant documentation on the behalf of Jarold Motto, PA,as directed by  Jarold Motto, PA while in the presence of Jarold Motto, [place].  I, Jarold Motto, [place], have reviewed all documentation for this visit. The documentation on 05/27/23 for the exam, diagnosis, procedures, and orders are all accurate and complete.  Jarold Motto, PA-C

## 2023-05-27 NOTE — Telephone Encounter (Signed)
FYI, see Triage note. Pt scheduled at 10:20.

## 2023-06-04 ENCOUNTER — Ambulatory Visit: Payer: Medicaid Other | Attending: Physician Assistant | Admitting: Physical Therapy

## 2023-06-11 ENCOUNTER — Encounter: Payer: Self-pay | Admitting: Adult Health

## 2023-06-11 ENCOUNTER — Ambulatory Visit: Payer: Medicaid Other | Admitting: Adult Health

## 2023-06-11 VITALS — BP 132/84 | HR 96 | Ht 65.0 in | Wt 139.0 lb

## 2023-06-11 DIAGNOSIS — Z8673 Personal history of transient ischemic attack (TIA), and cerebral infarction without residual deficits: Secondary | ICD-10-CM

## 2023-06-11 DIAGNOSIS — G43109 Migraine with aura, not intractable, without status migrainosus: Secondary | ICD-10-CM | POA: Diagnosis not present

## 2023-06-11 DIAGNOSIS — G4489 Other headache syndrome: Secondary | ICD-10-CM

## 2023-06-11 DIAGNOSIS — R42 Dizziness and giddiness: Secondary | ICD-10-CM

## 2023-06-11 MED ORDER — EMGALITY 120 MG/ML ~~LOC~~ SOAJ: 120.0000 mg | SUBCUTANEOUS | 11 refills | Status: DC

## 2023-06-11 MED ORDER — AMITRIPTYLINE HCL 25 MG PO TABS: 25.0000 mg | ORAL_TABLET | Freq: Every day | ORAL | 3 refills | Status: DC

## 2023-06-11 MED ORDER — MECLIZINE HCL 25 MG PO TABS: 25.0000 mg | ORAL_TABLET | Freq: Two times a day (BID) | ORAL | 11 refills | Status: DC | PRN

## 2023-06-11 NOTE — Patient Instructions (Addendum)
Start Emgality monthly injection for migraines - you will get first injection today and will repeat in 30 days and then administer every 30 days after that   Continue amitriptyline for now - can try discontinuing once you feel migraines have improved or after the first few injections. If you stop and migraines worsen, recommend restarting for a few more months and then can try to stop again   Continue meclizine to take as needed for vertigo   Continue aspirin and fenofibrate for secondary stroke prevention measures     Follow up in 6 months or call earlier if needed

## 2023-06-11 NOTE — Progress Notes (Signed)
Carla [name] [address] Carla [name]. [place] [address] [phone]       OFFICE FOLLOW UP NOTE  [name] Date of Birth:  1971/10/07 Medical Record Number:  [id number]   Carla [name] provider: Dr. Pearlean [name] Reason for visit:: hx of stroke, migraines, vertigo    HPI:  Update 06/11/2023 JM: Patient returns for follow-up visit after prior visit over 1.5 years ago.  She is accompanied by interpreter.  Reports 3-4 migraines per month, she continues on amitriptyline but questions other treatment options as she complains of severe dry mouth. Will use Tylenol with benefit.  Typically last 2 to 2.5 hours. Can worsen with increased stress.  No change in migraine characteristics since prior visit.  Continued memory difficulties such as when she is driving she will forget where she is going and also difficulty remembering appointments.  This is unchanged since prior visit.  Vertigo - c/o recent worsening with PCP who referred to vestibular rehab.  Continues meclizine only as needed with benefit.   Stable from stroke standpoint.  Denies new stroke/TIA symptoms.  Compliant on aspirin and fenofibrate.  Routinely follows with PCP for stroke risk factor management.     History provided for reference purposes only Update 10/17/2021 JM: Returns for 49-month follow-up accompanied by Carla [name] interpreter  Migraines stable - reports 2-3 migraines per month.  Remains on amitriptyline 25 mg nightly Does endorse awakening with very mild headache every morning  Vertigo stable as long as she takes meclizine once daily. She did try to stop meclizine but felt nauseous without it. Feels like liquid is draining from left ear - was seen by PCP back in August regarding this concern - she was referred to vestibular rehab due to worsening vertigo but did not attend due to lack of insurance. Reports when liquid is coming from ears is when she will feel nauseous and loses balance. Reports seeing a doctor  for this concern last year but unsure if this was a specialist (unable to view via epic)  She has also been experiencing forgetfulness which is not new. Examples include she will forget what she is supposed to be doing in the moment but will remember later on. She may misplace items but again remember where she placed them later on. Reports sleeping well at night but is fatigued during the day. Reports husband has told her she snores. No witnessed apneas.   No new stroke/TIA symptoms. Compliant on aspirin 81 mg daily and fenofibrate -denies side effects Recent LDL 116 (PCP increased fenofibrate from 54mg  to 145 mg daily) Blood pressure today 144/86  No further concerns at this time   Update 04/16/2021 JM: [name] returns for 46-month follow-up accompanied by Carla [name] interpreter.  Migraines have been stable - reports 1-2 per month typically lasting 30-40 minutes. Will use tylenol on occasion with benefit. She has remained on amitrityline but reports not being on effexor (denies ever being on it)  Vertigo stable occurring 1-2x weekly - will use meclizine only as needed with benefit  Stable from stroke standpoint without new stroke/TIA symptoms.  Reports intermittent use of aspirin 81mg  daily and daily use of fenofibrate without associated side effects.  PCP recently recommended started atorvastatin but she denies receiving prescription due to lipid panel showing LDL 122.  She has trialed atorvastatin in the past but unsure if reported side effects at that time from statin or multiple other medications being taken at this time.  She is willing to trial again.  Blood  pressure today 128/87.    No further concerns at this time   Update 10/16/2020 JM: [name] returns for 79-month follow-up accompanied by interpreter.  At prior visit, complaints of continued migraines which have since greatly improved with use of amitriptyline 25 mg nightly and venlafaxine 37.5 mg daily tolerating well  without side effects.  Reports 1 migraine over the past month.  She does report occasional tension type headaches but has been told likely due to new rx lenses and have been slowly improving.  Prior complaints of intermittent vertigo but overall stable and will take meclizine only as needed.  She does report evaluation by ENT Dr. Suszanne Conners for vertigo approximately 3 to 4 months ago but has not received results.  Stable from TIA/stroke standpoint without new or reoccurring stroke/TIA symptoms.  Remains on aspirin 81 mg daily and omega-3 for secondary stroke prevention of side effects.  Blood pressure today 125/76.  No further concerns at this time.  Update 06/27/2020 JM: [name] returns for follow-up accompanied by interpreter regarding reoccurrence of chronic migraines with underlying history of migraines, cryptogenic stroke 2016, TIA, vertigo, depression and anxiety. Evaluated at [name] on 06/13/2020 due to pain and numbness in her left arm accompanied by headache. All work-up unremarkable including D-dimer, EKG, chest x-ray and CT head and treated for presumed cervical radiculopathy with naproxen and advised to follow-up with PCP.  Evaluated by PCP 06/19/2020 with complete resolution of symptoms. Of note, presented to ED on 03/16/2020 with chief complaint of confusion and left arm numbness with headache and diagnosed with complex migraine. Migraine occurrence has improved since prior visit with ongoing use of amitriptyline 10 mg nightly.  Severe migraine occurrence 4 -5 times per months but does experience tension type headaches almost every other day lasting for approximately 1 hour and are not debilitating.  Reports tension type headaches occur with increased stress or anxiety.  She has not had any reoccurrence of left arm numbness or any other neurological symptoms Intermittent vertigo and PCP recently referred to ENT She remains uninsured but is currently in the process of applying for Medicaid   Update  03/21/2020 JM, [name] is being seen today accompanied by her son who assist with interpretation due to recent emergency room visit with left arm numbness and confusion accompanied by headache on 03/16/2020.  MRI brain negative for acute intracranial abnormality.  She was diagnosed with complex migraines as symptoms objective and no focal deficit identified.  She was advised to follow-up outpatient for further evaluation.  Prior medical history of cryptogenic stroke 2016, TIA, vertigo and migraines.  Previously treated in this office for migraine management on Topamax 25 mg twice daily but self discontinued due to difficulty tolerating.  She has not had any difficulties with migraines in over the past year and has not required any treatment.  Typical migraines recurred approximately 2 weeks ago located right temporal and periorbital area with pulsating quality with blurred vision, photophobia, phonophobia and nausea.  Symptoms consistent with previously known migraines.  She may also experience pain in occipital region.  She has been experiencing migraines every other day which last up until 2 hours after laying down and resting.  She has not had any recurrence of confusion or numbness/tingling.  She also reports symptoms of fatigue, dizziness, and shortness of breath which appears to be anxiety related per son.  She also recently self discontinued lovastatin due to subjective increased anxiety after taking.  Previously discussed underlying anxiety at prior visits which  likely contributing and/or worsening symptoms but she has previously declined further management or counseling.  She has continued on aspirin 81 mg daily without bleeding or bruising.  Blood pressure today 128/83.  No further concerns at this time.   Initial visit 09/29/2018 Dr. Pearlean [name]:  Ms Herbin is a pleasant 84 year Hispanic lady originally from [place] who was seen today for evaluation for headaches.  She is accompanied by her friend  as well as Spanish language interpreter who provides translation for this visit.  History is obtained from them, review of electronic medical records and have personally reviewed imaging films.  Patient has been complaining of increasing headaches for the last 2 months.  She describes this headache as being right temporal and frontal mostly throbbing in nature though fluctuating in severity from 6/10 to 10/10.  Headache is accompanied by nausea vomiting light and sound sensitivity.  She has to stop her activities and lie down and sleeping as well as taking Tylenol seems to help.  Headaches are accompanied by a single vision spots and scintillations and blurred vision.  She denies any speech difficulties, vertigo, diplopia, gait or balance difficulties with headaches.  Headaches usually last about 30 to 40 minutes.  Headaches of late have been occurring about 2-3 times per week.  She does give a remote history of infrequent headaches which sound like migraine headaches and was similar but were mostly perimenstrual and not bad.  The patient also states in September 2019 she had a episode of speech difficulties as well as dizziness.  She was seen by primary care physician who states that the patient had a headache as well as feeling of dizziness and some speech difficulties.  This happened following a stressful event.  An outpatient MRI was ordered which was done on 08/05/2018 which I personally reviewed shows a few nonspecific white matter hyperintensities but no acute abnormality or definite stroke.  Patient states her symptoms improved within a few days but the headaches have persisted since then.  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 MRA of the brain showed slight diminished peripheral branches in the left MCA but no large  vessel stenosis or occlusion. Carotid ultrasound showed no significant extracranial stenosis. Transthoracic echo showed normal ejection fraction. Transesophageal echocardiogram showed no cardiac source of embolism or PFO. Antiphospholipid antibodies and lupus anticoagulant were both negative. Lipid profile showed elevated total cholesterol 207 and LDL 139 mg percent. Hemoglobin A1c was 5.6. Patient was started on aspirin as well as protocol. She states her speech difficulty and right-sided weakness completely improved.  She was placed on aspirin and pravastatin which she actually did well on but for unclear reason she discontinued both medications.  She is currently on Lipitor 40 which was started a few months ago by primary physician but she is complaining of dizziness on this medication and wants to change it or stop it.  She states she had slight balance difficulties from her previous stroke but had recovered well from that.  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  She states her blood pressure is under good control.  Last lipid profile checked in August 2019 showed LDL of 120 following which Lipitor was added.  I had recommended doing a transcranial Doppler bubble study at last visit with me  in 2017 but for some unclear reason this was never done.  Update 12/30/2018: Mrs. Adriance is being seen today for follow-up visit for migraines and is accompanied by interpreter as patient is primarily Spanish-speaking.  Upon initial visit with Dr. Pearlean [name] on 09/29/2018 (visit info below), headaches were not frequent enough to justify initiating migraine prophylaxis.  She did call back on 12/02/2018 reporting daily migraines and therefore initiated Topamax 25 mg twice daily.  She took this medication for 4 days but discontinued as medication was making her too fatigued.  She consider taking 1 tab at night only but wanted to speak with [place] in regards to this first.  She was evaluated in  the ED on 12/19/2018 and returned on 12/23/2018 due to vaginal bleeding.  Apparently, all medications discontinued after 12/23/2018 visit and was started on Provera for 10-day course with recommending follow-up with OB/GYN for further evaluation.  She reports today that her headaches have improved and not experience a headache since ED admission on 12/23/2018.  She does have an appointment scheduled at [facility] on 01/12/2019 for further evaluation.  She denies any continued vaginal bleeding at this time.  Blood pressure today satisfactory 130/68. It was recommended after prior visit to undergo transcranial Doppler with bubble study to assess for possible PFO.  This was obtained on 10/14/2018 and was negative for PFO.  Update 06/21/2019 : She returns for follow-up after last visit 5 months ago.  She is accompanied by a [place] language interpreter who was present throughout the visit and interprets for her.  Patient states her main complaint today is that her migraine headaches seem to have come back.  In the last 3 months she has had increased frequency of headaches which occur 3 to 4 days/week.  The headache starts off as a throbbing pain around her right temple as well as periorbital region becomes unbearable 10/10 in severity.  She has relied on.  Light and sound do worsen the headache.  She does not throw up or feel nauseous.  She does not have visual symptoms with the headaches.  Headache may last for most of the day.  She is unable to identify specific triggers for headaches but does feel tightness in the back of her head and neck muscles.  She has not tried triptans or prescription migraine specific medications she has tried taking Tylenol and ibuprofen without relief.  She has been taking naproxen which seems to work better.  She has been taking them 3 to 4 days a week.  She was previously on Topamax but when she started having dysfunctional uterine bleeding all the medications were stopped including  Topamax.  She is unable to tell me if she had any bad side effects on it.  Patient recently changed her primary care physician and a new physician assistant discontinued protocol for unclear reason.  She remains on aspirin which is tolerating well without bruising or bleeding.  Her blood pressure is under good control and today it is 142/83.  She has had no recurrent stroke or TIA symptoms.  Update 09/02/2019: Ms. Yorker is being seen today for 64-month follow-up regarding migraine headaches accompanied by interpreter.  After prior visit, Topamax 25 mg daily was restarted due to worsening migraines as well as Relpax for emergent relief.  She did not tolerate Topamax as she reports this caused weight loss and short-term memory loss.  She was unable to afford Relpax.  She has recently been experiencing migraines 2 times weekly and typically lasts  for 1 hour total but during this time, migraines are debilitating and continues to be located right temporal area.  She has not trialed any other type of migraine medication.  Recent diagnosis of COVID-19 in 06/2019 and feels as though she has not completely recovered mentally and physically.  After prior visit, lipid panel obtained and recommended restart pravastatin but due to side effects, PCP initiated Crestor.  She reports difficulty tolerating Crestor but plans on following up with PCP for further monitoring and management.  Blood pressure today 135/91.  Update 11/04/2019: Ms. Machovec is a 52 year old female who is being seen today by patient request due to concerns of balance difficulties concern for stroke.  She is accompanied today by a interpreter.  She did not seek medical treatment at onset.  Approximately 1.5 weeks ago, she states she started experiencing intermittent balance impairment with a swaying type sensation and room spinning.  Symptoms severe where she will have to hold onto something and close her eyes.  She states she experienced same type of  symptoms with prior stroke.  She denies associated headache, neck pain, speech difficulty, visual changes, weakness or numbness/tingling.  She denies increased stress or anxiety/depression.  She states standing straight up or laying flat or with head movement induces the symptoms.  She denies currently feeling symptoms at today's visit.  She does endorse recently stopping her metoprolol 3 days ago due to increased stress and irritability symptoms.  She does monitor blood pressure at home and SBP 130s.  She is unable to endorse diastolic pressures or heart rates.  Blood pressure today satisfactory at 132/96.  He does endorse continuation of aspirin 81 mg daily and recently started on pravastatin approximately 3 weeks ago by PCP.  Update 11/09/2019: Ms. Bridwell is a 52 year old female who is being seen today by patient request due to recent ED admission on 11/06/2019 for ongoing concerns of dizziness previously evaluated in office 5 days ago.  She is accompanied by interpreter.  Per review of ED note, she reported worsening dizziness and headache 2 days prior to ED admission concerning for stroke.  CT head and MRI brain no acute abnormalities.  She was advised to follow-up with PCP and neurology at discharge.  She has been doing well since discharge without any reoccurring vertigo or dizziness symptoms or headaches.  She has continued to take meclizine with benefit 25 mg tablets twice daily.  She continues on aspirin 81 mg daily and pravastatin for secondary stroke prevention without side effects. Blood pressure today 124/71.  No further concerns at this time.  Update 01/04/2020: Ms. Pourciau is a 52 year old female who is being seen today for follow-up regarding prior concerns of dizziness accompanied by interpreter.  Denies residual/dizziness symptoms.  Continues twice daily meclizine with occasional fatigue.  Continues on aspirin 81 mg daily and pravastatin for secondary stroke prevention without side effects.   Blood pressure today 127/78.  Mild short-term memory loss but endorses ongoing improvement.  No concerns at this time.    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   Hyperlipidemia    Hypertension    Stroke (HCC)    09/2015   Vertigo     Social History:  Social History   Socioeconomic History   Marital status: Married  Spouse name: Not on file   Number of children: Not on file   Years of education: Not on file   Highest education level: Not on file  Occupational History   Occupation: Financial trader  Tobacco Use   Smoking status: Never   Smokeless tobacco: Never  Vaping Use   Vaping status: Never Used  Substance and Sexual Activity   Alcohol use: No   Drug use: No   Sexual activity: Not Currently    Birth control/protection: None  Other Topics Concern   Not on file  Social History Narrative   From [place]   Lived in [place] for 20 years, and been in [place] for 6 years   Husband and son, 9   Works: clean homes   Social Determinants of Corporate investment banker Strain: Not on Ship broker Insecurity: Not on file  Transportation Needs: Not on file  Physical Activity: Not on file  Stress: Not on file  Social Connections: Not on file  Intimate Partner Violence: Not on file    Medications:   Current Outpatient Medications on File Prior to Visit  Medication Sig Dispense Refill   amitriptyline (ELAVIL) 25 MG tablet TOME UNA TABLETA TODOS LOS DIAS AL ACOSTARSE 90 tablet 3   aspirin EC 81 MG tablet Take 81 mg by mouth daily. Swallow whole.     cetirizine (ZYRTEC) 10 MG tablet Take 1 tablet (10 mg total) by mouth daily. 30 tablet 11   fenofibrate (TRICOR) 145 MG tablet Take 1 tablet (145 mg total) by mouth daily. 90 tablet 1   metoprolol tartrate (LOPRESSOR) 25 MG tablet TAKE 1/2 TABLET POR VIA ORAL  DOS VECES AL DIA 90 tablet 1   triamcinolone cream (KENALOG) 0.1 % Apply to affected area 1-2 times daily 30 g 5   dicyclomine (BENTYL) 20 MG tablet Take 1 tablet (20 mg total) by mouth 3 (three) times daily before meals. (Patient not taking: Reported on 05/27/2023) 30 tablet 0   meclizine (ANTIVERT) 25 MG tablet Take 1 tablet (25 mg total) by mouth 2 (two) times daily as needed for dizziness. (Patient not taking: Reported on 05/27/2023) 60 tablet 5   pantoprazole (PROTONIX) 40 MG tablet Take 1 tablet (40 mg total) by mouth daily. (Patient not taking: Reported on 05/27/2023) 30 tablet 3   No current facility-administered medications on file prior to visit.    Allergies:   Allergies  Allergen Reactions   Pravastatin Anxiety   Tramadol Nausea Only    Physical Exam  Today's Vitals   06/11/23 1452  BP: 132/84  Pulse: 96  Weight: 139 lb (63 kg)  Height: 5\' 5"  (1.651 m)   Body mass index is 23.13 kg/m.  Physical exam General: well developed, well nourished, pleasant middle-aged Hispanic female, seated, in no apparent distress Head: head normocephalic and atraumatic.   Neck: supple with no carotid or supraclavicular bruits Cardiovascular: regular rate and rhythm, no murmurs Musculoskeletal: no deformity Skin:  no rash/petichiae Vascular:  Normal pulses all extremities  Neurologic Exam Mental Status: awake and fully alert. Primarily Spanish-speaking with limited English. Denies speech and language concerns.  Oriented to place and time. Recent memory subjectively impaired and remote memory intact. Attention span, concentration and fund of knowledge appropriate. Mood and affect appropriate Cranial Nerves: Pupils equal, briskly reactive to light.  Extraocular movements full without nystagmus. Visual fields full to confrontation. Hearing intact. Facial sensation intact. Face, tongue, palate moves normally and symmetrically.   Motor: Normal bulk and  tone. Normal strength in all tested extremity  muscles. Sensory.: intact to touch , pinprick , position and vibratory sensation.  Coordination: Rapid alternating movements normal in all extremities. Finger-to-nose and heel-to-shin performed accurately bilaterally. Gait and Station: Arises from chair without difficulty. Stance is normal. Gait demonstrates normal stride length and balance . Able to heel, toe and tandem walk without difficulty.   Reflexes: 1+ and symmetric. Toes downgoing.        ASSESSMENT/PLAN:  Carla [name] is a 52 year old Hispanic lady with with PMH of cryptogenic stroke 2016, TIA, HDL, HTN, BPPV, anxiety and migraines.  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 -Currently experiencing 3-4 migraines per month -start Emgality monthly injection - received loading dose today with education provided -continue amitriptyline 25mg  nightly for now -can discontinue after 2 to 3 injections with Emgality but advised to restart if migraines worsen for least a couple more months -Previously prescribed venlafaxine but pt denies being on - as she has been stable, will hold off on restarting -Continue Tylenol as needed -tried/failed: Topiramate, amitriptyline, venlafaxine, metoprolol  Vertigo, intermittent -c/o recent worsening - plans on starting vestibular therapy -Use of meclizine as needed  History of stroke/TIA -Continue aspirin 81 mg and fenofibrate 145mg  daily for secondary stroke prevention.  -PCP referred to lipid clinic with recent LDL 109 and history of statin intolerance -Continue to follow with PCP for HTN and HLD management and secondary stroke risk factor management with BP goal<130/90 and LDL goal<70     Follow-up in 6 months or call earlier if  needed   CC:  Jarold Motto, [place]     I spent 42 minutes of face-to-face and non-face-to-face time with patient assisted by interpreter.  This included previsit chart review, lab review, study review, order entry, electronic health record documentation, patient education and discussion regarding above diagnoses and treatment plan and answered all other questions to patient's satisfaction  Ihor Austin, [name] [place]  [name] [address] [place], [place] [address]  Phone [phone] Fax [phone] Note: This document was prepared with digital dictation and possible smart phrase technology. Any transcriptional errors that result from this process are unintentional.

## 2023-06-13 ENCOUNTER — Telehealth: Payer: Self-pay

## 2023-06-13 ENCOUNTER — Other Ambulatory Visit (HOSPITAL_COMMUNITY): Payer: Self-pay

## 2023-06-13 NOTE — Telephone Encounter (Signed)
Pharmacy Patient Advocate Encounter   Received notification from CoverMyMeds that prior authorization for Emgality 120MG /ML auto-injectors (migraine) is required/requested.   Insurance verification completed.   The patient is insured through [facility] MEDICAID .   Per test claim: PA submitted to [facility] MEDICAID via CoverMyMeds Key/confirmation #/EOC  [facility] Status is pending

## 2023-06-16 NOTE — Telephone Encounter (Signed)
Pharmacy Patient Advocate Encounter  Received notification from [facility] that Prior Authorization for Emgality 120MG /ML auto-injectors (migraine) has been APPROVED from 06/14/2023 to 09/13/2023.Marland Kitchen  PA #/Case ID/Reference #: [id number]

## 2023-06-30 ENCOUNTER — Ambulatory Visit: Payer: Medicaid Other | Attending: Physical Therapy | Admitting: Physical Therapy

## 2023-07-07 ENCOUNTER — Telehealth: Payer: Self-pay | Admitting: Adult Health

## 2023-07-07 NOTE — Telephone Encounter (Signed)
I have not heard of Emgality causing daily nausea, I would recommend she continue Emgality and she f/u with her PCP to rule out other contributing factors of her nausea.  If she is completely against continuing Emgality, can switch to Ajovy but again, I do not believe Emgality is causing nausea.

## 2023-07-07 NOTE — Telephone Encounter (Signed)
Called and spoke to pt using interpreter services and relayed information. Pt was against emgality & stated she had to have 2 teeth extracted and the anesthesia wouldn't work she is afraid to start any injections she prefers pills. I told her I would let the provider know

## 2023-07-07 NOTE — Telephone Encounter (Signed)
Pt called needing to discuss her migraine medication. She states that the Galcanezumab-gnlm [facility]) 120 MG/ML SOAJ is making her nauseous every morning and does not want to take it any longer. She would like to discuss other options.

## 2023-07-08 MED ORDER — GABAPENTIN 300 MG PO CAPS: 300.0000 mg | ORAL_CAPSULE | Freq: Every day | ORAL | 3 refills | Status: DC

## 2023-07-08 MED ORDER — GABAPENTIN 300 MG PO CAPS: 300.0000 mg | ORAL_CAPSULE | Freq: Every day | ORAL | 6 refills | Status: DC

## 2023-07-08 NOTE — Telephone Encounter (Signed)
Called pt to inform Carla [name]'s recommendations. Pt has decided to stop taking the Emgality because her insurance its getting ready to expire and she doesn't know if she will be able to get approved again. Per Jessica's recommendations, pt will start taking Gabapentin 300 mg at bedtime. Pt advised it will take about 2-4 weeks for her to notice a change with the new medication. Prescription has been sent to pt's pharmacy. Pt verbalized understanding. Pt had no additional questions at this time but was encouraged to call back if questions arise.

## 2023-07-08 NOTE — Addendum Note (Signed)
Addended by: Ihor Austin L on: 07/08/2023 10:27 AM   Modules accepted: Orders

## 2023-07-08 NOTE — Telephone Encounter (Signed)
Please advise patient that CRGP injection would not interfere with anesthesia or effectiveness anesthesia.  Nevertheless, if she wishes to be on oral preventative medication instead, could try Qulipta in addition to amitriptyline 25 mg nightly.  Would not recommend increasing amitriptyline due to complaints of xerostomia already. If patient agrees to trying Bennie Pierini, please send prescription to pharmacy. If insurance will not cover, could consider trying gabapentin.  Thank you.

## 2023-07-08 NOTE — Addendum Note (Signed)
Addended by: Berna Spare A on: 07/08/2023 09:36 AM   Modules accepted: Orders

## 2023-07-10 ENCOUNTER — Encounter (INDEPENDENT_AMBULATORY_CARE_PROVIDER_SITE_OTHER): Payer: Self-pay

## 2023-07-29 ENCOUNTER — Telehealth: Payer: Self-pay | Admitting: Adult Health

## 2023-07-29 NOTE — Telephone Encounter (Signed)
Pt is asking for a call from RN to discuss how the  gabapentin (NEURONTIN) 300 MG capsule, gives her panic attacks each time she takes it.  Pt does not want to take this any longer and wants a call to discuss taking something else.

## 2023-07-30 NOTE — Telephone Encounter (Signed)
This is not a typical side effect of this medication but patient can stop to see if symptoms improve.  If symptoms persist despite stopping, would recommend she follow-up with her PCP for further discussion.

## 2023-07-30 NOTE — Telephone Encounter (Signed)
Called pt and relayed Carla [name]'s recommendation to stop taking the Gabapentin. Pt advised that if symptoms of anxiety and panic continue she would need to follow up with her primary care physician for further discussion. Pt verbalized understanding. Pt had no additional questions at this time but was encouraged to call back if questions arise. All information was provided to patient in Spanish.

## 2023-07-30 NOTE — Telephone Encounter (Signed)
Returned pt's call regarding her Gabapentin 300 mg medication. Pt stated she has been taking Gabapentin 300 mg at bedtime for 3.5 weeks now, she states that for the past 1.5 weeks she started having panic attacks. States she is having anxiety, shakiness, feeling fearful that something is going to happen. States she doesn't wake up feeling like this, she wakes up feeling fine, and once the day progresses then she starts feeling this way. Pt stated nothing has changed in her life for her to be feeling this way. The only change has been this medication she is taking. States she would like to decrease the dosage if possible or to stop taking it completely and just continue taking the [place]. Pt states this has been affecting her job and she does not like the way this is affecting her and would like to feel better. Please advise.

## 2023-08-01 ENCOUNTER — Ambulatory Visit: Payer: Medicaid Other | Admitting: Physician Assistant

## 2023-08-01 ENCOUNTER — Ambulatory Visit (INDEPENDENT_AMBULATORY_CARE_PROVIDER_SITE_OTHER): Payer: Medicaid Other | Admitting: Physician Assistant

## 2023-08-01 ENCOUNTER — Encounter: Payer: Self-pay | Admitting: Physician Assistant

## 2023-08-01 VITALS — BP 150/90 | HR 68 | Temp 98.2°F | Ht 65.0 in | Wt 139.4 lb

## 2023-08-01 DIAGNOSIS — I10 Essential (primary) hypertension: Secondary | ICD-10-CM | POA: Diagnosis not present

## 2023-08-01 DIAGNOSIS — G43909 Migraine, unspecified, not intractable, without status migrainosus: Secondary | ICD-10-CM

## 2023-08-01 MED ORDER — BLOOD PRESSURE KIT: 1.0000 | PACK | Freq: Once | 0 refills | Status: AC

## 2023-08-01 NOTE — Patient Instructions (Signed)
It was great to see you!  Check your blood pressure 1-2 x per week and goal is <130/90  Please let me know if you have continued elevated blood pressures at home  Take care,  Jarold Motto PA-C

## 2023-08-01 NOTE — Progress Notes (Signed)
Carla [name] is a 52 y.o. female here for a follow up of a pre-existing problem.  History of Present Illness:   Chief Complaint  Patient presents with   Hypertension      Patient was accompanied by an in person interpretor today.   Hypertension Patient is compliant with 25 mg lopressor, taking half in the morning and the other half in the evening. She does check her blood pressure at home, but states that she is having trouble with the machine and may need to purchase another one.  Denies chest pain, shortness of breath   Migraines Patient reports that she has been experiencing migraines. She saw a neurologist in July who tried to start her on emgality injectables; however, the patient did not want to go that route. She did end up have the initial injection in the office, however she does not like the way that it made her feel. Gabapentin 300 mg nightly sent in. She took this but felt like it gave her panic attacks. She is now waiting to hear back from them on next recommendations.  She is complaint with 25 mg Elavil but has dry mouth. 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   Hyperlipidemia    Hypertension    Stroke (HCC)    09/2015   Vertigo      Social History   Tobacco Use   Smoking status: Never   Smokeless tobacco: Never  Vaping Use   Vaping status: Never Used  Substance Use Topics   Alcohol use: No   Drug use: No    Past Surgical History:  Procedure Laterality Date   RIGHT ARM SURGERY     TEE WITHOUT CARDIOVERSION N/A 10/13/2015   Procedure: TRANSESOPHAGEAL ECHOCARDIOGRAM (TEE);  Surgeon: Lewayne Bunting, MD;  Location: [facility] ENDOSCOPY;  Service: Cardiovascular;  Laterality: N/A;    Family History  Problem Relation Age of Onset   Leukemia Mother    Colon cancer Brother    Anxiety disorder Brother    Asthma Neg Hx    Cancer Neg Hx    Diabetes Neg Hx    Hyperlipidemia Neg Hx     Heart failure Neg Hx    Hypertension Neg Hx    Migraines Neg Hx    Rashes / Skin problems Neg Hx    Seizures Neg Hx    Stroke Neg Hx    Thyroid disease Neg Hx     Allergies  Allergen Reactions   Gabapentin Other (See Comments)    Panic attacks   Pravastatin Anxiety   Tramadol Nausea Only    Current Medications:   Current Outpatient Medications:    amitriptyline (ELAVIL) 25 MG tablet, Take 1 tablet (25 mg total) by mouth at bedtime., Disp: 90 tablet, Rfl: 3   aspirin EC 81 MG tablet, Take 81 mg by mouth daily. Swallow whole., Disp: , Rfl:    Blood Pressure KIT, 1 each by Does not apply route once for 1 dose., Disp: 1 kit, Rfl: 0   meclizine (ANTIVERT) 25 MG tablet, Take 1 tablet (25 mg total) by mouth 2 (two) times daily as needed for dizziness., Disp: 30 tablet, Rfl: 11   metoprolol tartrate (LOPRESSOR) 25 MG tablet, TAKE 1/2 TABLET POR VIA ORAL DOS VECES AL DIA, Disp: 90 tablet, Rfl: 1   triamcinolone cream (KENALOG) 0.1 %, Apply to affected area 1-2 times daily, Disp: 30 g,  Rfl: 5   Review of Systems:   ROS Negative unless otherwise specified per HPI.  Vitals:   Vitals:   08/01/23 1345 08/01/23 1420  BP: (!) 148/96 (!) 150/90  Pulse: 68   Temp: 98.2 F (36.8 C)   TempSrc: Temporal   SpO2: 97%   Weight: 139 lb 6.1 oz (63.2 kg)   Height: 5\' 5"  (1.651 m)      Body mass index is 23.19 kg/m.  Physical Exam:   Physical Exam Vitals and nursing note reviewed.  Constitutional:      General: She is not in acute distress.    Appearance: She is well-developed. She is not ill-appearing or toxic-appearing.  Cardiovascular:     Rate and Rhythm: Normal rate and regular rhythm.     Pulses: Normal pulses.     Heart sounds: Normal heart sounds, S1 normal and S2 normal.  Pulmonary:     Effort: Pulmonary effort is normal.     Breath sounds: Normal breath sounds.  Skin:    General: Skin is warm and dry.  Neurological:     Mental Status: She is alert.     GCS: GCS eye  subscore is 4. GCS verbal subscore is 5. GCS motor subscore is 6.  Psychiatric:        Speech: Speech normal.        Behavior: Behavior normal. Behavior is cooperative.     Assessment and Plan:   Essential hypertension Above goal today No evidence of end-organ damage on my exam Recommend patient monitor home blood pressure at least a few times weekly Continue 12.5 mg metoprolol twice daily  If home monitoring shows consistent elevation, or any symptom(s) develop, recommend reach out to [place] for further advice on next steps Provided her with a coupon to purchase this at [facility] pharmacy   Migraine without status migrainosus, not intractable, unspecified migraine type Management per neurology   Jarold Motto, PA-C

## 2023-08-05 ENCOUNTER — Institutional Professional Consult (permissible substitution) (HOSPITAL_BASED_OUTPATIENT_CLINIC_OR_DEPARTMENT_OTHER): Payer: Medicaid Other | Admitting: Internal Medicine

## 2023-09-12 ENCOUNTER — Encounter (HOSPITAL_COMMUNITY): Payer: Self-pay

## 2023-09-12 ENCOUNTER — Ambulatory Visit (HOSPITAL_COMMUNITY)
Admission: EM | Admit: 2023-09-12 | Discharge: 2023-09-12 | Disposition: A | Discharge status: Home / Self Care | Payer: Medicaid Other | Attending: Internal Medicine | Admitting: Internal Medicine

## 2023-09-12 ENCOUNTER — Ambulatory Visit: Payer: Medicaid Other | Admitting: Family

## 2023-09-12 DIAGNOSIS — R350 Frequency of micturition: Secondary | ICD-10-CM | POA: Insufficient documentation

## 2023-09-12 DIAGNOSIS — R1011 Right upper quadrant pain: Secondary | ICD-10-CM | POA: Diagnosis present

## 2023-09-12 DIAGNOSIS — R110 Nausea: Secondary | ICD-10-CM | POA: Diagnosis not present

## 2023-09-12 LAB — COMPREHENSIVE METABOLIC PANEL
ALT: 24 U/L (ref 0–44)
AST: 23 U/L (ref 15–41)
Albumin: 3.9 g/dL (ref 3.5–5.0)
Alkaline Phosphatase: 74 U/L (ref 38–126)
Anion gap: 9 (ref 5–15)
BUN: 10 mg/dL (ref 6–20)
CO2: 29 mmol/L (ref 22–32)
Calcium: 9.9 mg/dL (ref 8.9–10.3)
Chloride: 102 mmol/L (ref 98–111)
Creatinine, Ser: 1.05 mg/dL — ABNORMAL HIGH (ref 0.44–1.00)
GFR, Estimated: >60 mL/min (ref >60)
Glucose, Bld: 100 mg/dL — ABNORMAL HIGH (ref 70–99)
Potassium: 4 mmol/L (ref 3.5–5.1)
Sodium: 140 mmol/L (ref 135–145)
Total Bilirubin: 0.7 mg/dL (ref 0.3–1.2)
Total Protein: 7 g/dL (ref 6.5–8.1)

## 2023-09-12 LAB — POCT URINALYSIS DIP (MANUAL ENTRY)
Bilirubin, UA: NEGATIVE
Glucose, UA: NEGATIVE mg/dL
Ketones, POC UA: NEGATIVE mg/dL
Leukocytes, UA: NEGATIVE
Nitrite, UA: NEGATIVE
Protein Ur, POC: NEGATIVE mg/dL
Spec Grav, UA: 1.02 (ref 1.010–1.025)
Urobilinogen, UA: 0.2 U/dL
pH, UA: 6.5 (ref 5.0–8.0)

## 2023-09-12 LAB — CBC
HCT: 42.4 % (ref 36.0–46.0)
Hemoglobin: 13.9 g/dL (ref 12.0–15.0)
MCH: 29.1 pg (ref 26.0–34.0)
MCHC: 32.8 g/dL (ref 30.0–36.0)
MCV: 88.9 fL (ref 80.0–100.0)
Platelets: 370 10*3/uL (ref 150–400)
RBC: 4.77 MIL/uL (ref 3.87–5.11)
RDW: 12.3 % (ref 11.5–15.5)
WBC: 7 10*3/uL (ref 4.0–10.5)
nRBC: 0 % (ref 0.0–0.2)

## 2023-09-12 MED ORDER — ONDANSETRON 4 MG PO TBDP
ORAL_TABLET | ORAL | Status: AC
  Filled 2023-09-12: qty 1

## 2023-09-12 MED ORDER — ONDANSETRON 4 MG PO TBDP: 4.0000 mg | ORAL_TABLET | Freq: Four times a day (QID) | ORAL | 0 refills | Status: DC | PRN

## 2023-09-12 MED ORDER — ONDANSETRON 4 MG PO TBDP
4.0000 mg | ORAL_TABLET | Freq: Once | ORAL | Status: AC
  Administered 2023-09-12: 4 mg via ORAL

## 2023-09-12 NOTE — ED Triage Notes (Signed)
Pt presents to urgent care today with nausea, urinary frequency, and RUQ pain ("long, long time"-- feels like a burning sensation). Pt is also concerned as she has been dizzy as well as having blurred vision. Pt states "I feel like I have been confused lately as well, I was driving in my car and could not remember where I was going." Pt denies taking medication for pain/symptoms reported.

## 2023-09-12 NOTE — ED Provider Notes (Addendum)
[facility]    CSN: [id number] Arrival date & time: 09/12/23  1420     History   Chief Complaint Chief Complaint  Patient presents with   Abdominal Pain   Urinary Frequency   Nausea    HPI Carla [name] is a 52 y.o. female.  Presents with 10/10 RUQ pain This has been ongoing for several months. No acute worsening, just persistent. Also reports nausea daily and urinary frequency. Denies dysuria, hematuria. No vomiting or fevers Has been seen several times for abdominal pain, most recently 2 weeks ago at an urgent care where xray showed moderate constipation and gas. Was advised miralax, colace, simethicone. She had an xray done at PCP in May showing same.   Reports dizziness and blurred vision, however these symptoms have also been present for several months. No worsening.   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   Premature ventricular contractions 03/25/2016   Hyperlipidemia 03/25/2016   Benign paroxysmal positional vertigo 03/11/2016   Neck pain on right side 02/19/2016   Cryptogenic stroke (HCC) 12/28/2015   Multiple hemangiomas    Cerebral infarction [facility])    Cavernous hemangioma of liver 10/11/2015   Migraines 10/11/2015    Past Surgical History:  Procedure Laterality Date   RIGHT ARM SURGERY     TEE WITHOUT CARDIOVERSION N/A 10/13/2015   Procedure: TRANSESOPHAGEAL ECHOCARDIOGRAM (TEE);  Surgeon: Lewayne Bunting, MD;  Location: [facility] ENDOSCOPY;  Service: Cardiovascular;  Laterality: N/A;    OB History     Gravida  3   Para  2   Term  1   Preterm  1   AB  0   Living  2      SAB  0   IAB  0   Ectopic  0    Multiple  0   Live Births  2            Home Medications    Prior to Admission medications   Medication Sig Start Date End Date Taking? Authorizing Provider  aspirin EC 81 MG tablet Take 81 mg by mouth daily. Swallow whole.   Yes [provider]  ondansetron (ZOFRAN-ODT) 4 MG disintegrating tablet Take 1 tablet (4 mg total) by mouth every 6 (six) hours as needed for nausea or vomiting. 09/12/23  Yes Kyrus Hyde, Lurena Joiner, PA-C  amitriptyline (ELAVIL) 25 MG tablet Take 1 tablet (25 mg total) by mouth at bedtime. 06/11/23   Ihor Austin, NP  meclizine (ANTIVERT) 25 MG tablet Take 1 tablet (25 mg total) by mouth 2 (two) times daily as needed for dizziness. 06/11/23   Ihor Austin, NP  metoprolol tartrate (LOPRESSOR) 25 MG tablet TAKE 1/2 TABLET POR VIA ORAL DOS VECES AL DIA 05/12/23   Jarold Motto, PA    Family History Family History  Problem Relation Age of Onset   Leukemia Mother    Colon cancer Brother    Anxiety disorder Brother    Asthma Neg Hx    Cancer Neg Hx    Diabetes Neg Hx    Hyperlipidemia Neg Hx  Heart failure Neg Hx    Hypertension Neg Hx    Migraines Neg Hx    Rashes / Skin problems Neg Hx    Seizures Neg Hx    Stroke Neg Hx    Thyroid disease Neg Hx     Social History Social History   Tobacco Use   Smoking status: Never   Smokeless tobacco: Never  Vaping Use   Vaping status: Never Used  Substance Use Topics   Alcohol use: No   Drug use: No     Allergies   Gabapentin, Pravastatin, and Tramadol   Review of Systems Review of Systems As per HPI  Physical Exam Triage Vital Signs ED Triage Vitals  Encounter Vitals Group     BP 09/12/23 1528 133/84     Systolic BP Percentile --      Diastolic BP Percentile --      Pulse Rate 09/12/23 1528 98     Resp 09/12/23 1528 16     Temp 09/12/23 1528 97.6 F (36.4 C)     Temp Source 09/12/23 1528 Oral     SpO2 09/12/23 1528 99 %     Weight --      Height --      Head Circumference  --      Peak Flow --      Pain Score 09/12/23 1530 10     Pain Loc --      Pain Education --      Exclude from Growth Chart --    No data found.  Updated Vital Signs BP 133/84 (BP Location: Right Arm)   Pulse 98   Temp 97.6 F (36.4 C) (Oral)   Resp 16   LMP 01/16/2020   SpO2 99%   Physical Exam Vitals and nursing note reviewed.  Constitutional:      General: She is not in acute distress.    Appearance: Normal appearance. She is not ill-appearing.  HENT:     Mouth/Throat:     Mouth: Mucous membranes are moist.     Pharynx: Oropharynx is clear.  Eyes:     Conjunctiva/sclera: Conjunctivae normal.  Cardiovascular:     Rate and Rhythm: Normal rate and regular rhythm.     Pulses: Normal pulses.          Radial pulses are 2+ on the right side and 2+ on the left side.     Heart sounds: Normal heart sounds.  Pulmonary:     Effort: Pulmonary effort is normal. No respiratory distress.     Breath sounds: Normal breath sounds.  Abdominal:     General: Bowel sounds are normal.     Palpations: Abdomen is soft.     Tenderness: There is no abdominal tenderness. There is no right CVA tenderness, left CVA tenderness, guarding or rebound. Negative signs include Murphy's sign.  Musculoskeletal:        General: Normal range of motion.     Right lower leg: No edema.     Left lower leg: No edema.  Skin:    General: Skin is warm and dry.  Neurological:     Mental Status: She is alert and oriented to person, place, and time.    UC Treatments / Results  Labs (all labs ordered are listed, but only abnormal results are displayed) Labs Reviewed  COMPREHENSIVE METABOLIC PANEL - Abnormal; Notable for the following components:      Result Value   Glucose, Bld 100 (*)    Creatinine,  Ser 1.05 (*)    All other components within normal limits  POCT URINALYSIS DIP (MANUAL ENTRY) - Abnormal; Notable for the following components:   Blood, UA trace-intact (*)    All other components within  normal limits  URINE CULTURE  CBC    EKG  Radiology No results found.  Procedures Procedures (including critical care time)  Medications Ordered in UC Medications  ondansetron (ZOFRAN-ODT) disintegrating tablet 4 mg (4 mg Oral Given 09/12/23 1625)    Initial Impression / Assessment and Plan / UC Course  I have reviewed the triage vital signs and the nursing notes.  Pertinent labs & imaging results that were available during my care of the patient were reviewed by me and considered in my medical decision making (see chart for details).  CBC is unremarkable  CMP slight increase in creatinine, 1.05. push fluids. Otherwise WNL.  UA with trace RBC. No concern for kidney infection or stone at this time. She's had hematuria in the past. Also has a PCP available for follow up. Will culture given the urinary frequency, although this symptom has also been present for months and is not acute.   Afebrile, well appearing. She has no tenderness on exam. Symptoms have been present and constant for several months. She has been referred to GI in the past but has never made appointment. I have provided her with number to call first thing Monday and make an appointment. In the meantime can use zofran q6 hours prn, continue tylenol if needed, increase fluids. There are no red flags at this time Discussed reasons to be seen in the emergency department Patient agreeable to plan, all questions answered  Final Clinical Impressions(s) / UC Diagnoses   Final diagnoses:  Right upper quadrant abdominal pain  Urinary frequency  Nausea     Discharge Instructions      The zofran can be used every 6 hours as needed for nausea Continue tylenol for pain Drink lots of water I will call you if anything is abnormal on your blood work Please call the stomach specialist to make an appointment for follow up. Call first thing Monday morning. Please go to the emergency department if symptoms worsen.  El  zofran se puede utilizar cada 6 horas segn sea necesario para las nuseas. Continuar con Tylenol para el dolor. Bebe mucha agua Te llamar si hay algo anormal en tu anlisis de sangre. Llame al especialista en estmago para programar una cita de seguimiento. Llama el lunes a primera hora. Acuda al servicio de urgencias si los sntomas empeoran.     ED Prescriptions     Medication Sig Dispense Auth. Provider   ondansetron (ZOFRAN-ODT) 4 MG disintegrating tablet Take 1 tablet (4 mg total) by mouth every 6 (six) hours as needed for nausea or vomiting. 20 tablet Jodey Burbano, Lurena Joiner, PA-C      PDMP not reviewed this encounter.     Nitisha Civello, Lurena Joiner, [place] 09/12/23 1759

## 2023-09-12 NOTE — Discharge Instructions (Addendum)
The zofran can be used every 6 hours as needed for nausea Continue tylenol for pain Drink lots of water I will call you if anything is abnormal on your blood work Please call the stomach specialist to make an appointment for follow up. Call first thing Monday morning. Please go to the emergency department if symptoms worsen.  El zofran se puede utilizar cada 6 horas segn sea necesario para las nuseas. Continuar con Tylenol para el dolor. Bebe mucha agua Te llamar si hay algo anormal en tu anlisis de sangre. Llame al especialista en estmago para programar una cita de seguimiento. Llama el lunes a primera hora. Acuda al servicio de urgencias si los sntomas empeoran.

## 2023-09-14 LAB — URINE CULTURE: Culture: <10000 — AB

## 2023-09-16 ENCOUNTER — Ambulatory Visit (INDEPENDENT_AMBULATORY_CARE_PROVIDER_SITE_OTHER): Payer: Medicaid Other | Admitting: Physician Assistant

## 2023-09-16 ENCOUNTER — Encounter: Payer: Self-pay | Admitting: Physician Assistant

## 2023-09-16 VITALS — BP 136/90 | HR 99 | Ht 65.0 in | Wt 138.4 lb

## 2023-09-16 DIAGNOSIS — K5900 Constipation, unspecified: Secondary | ICD-10-CM | POA: Diagnosis not present

## 2023-09-16 DIAGNOSIS — R1011 Right upper quadrant pain: Secondary | ICD-10-CM | POA: Diagnosis not present

## 2023-09-16 DIAGNOSIS — Z1211 Encounter for screening for malignant neoplasm of colon: Secondary | ICD-10-CM

## 2023-09-16 NOTE — Patient Instructions (Signed)
It was great to see you!  To treat your constipation today: -Start in 1 capful of polyethylene glycol (also known as Miralax, however generic is fine!) to beverage of choice daily -Drink 64 oz of water daily  For your abdominal pain: Please start OTC (available over the counter without a prescription) omeprazole (see handout)  We are going to put in a referral for right upper quadrant of abdomen ultrasound to look for gallstones Someone will call you to schedule this  We are also going to place another referral for gastroenterology  Please call them to schedule: [phone]   Take care,  Jarold Motto PA-C

## 2023-09-16 NOTE — Progress Notes (Signed)
Carla [name] is a 52 y.o. female here for a follow-up of a pre-existing problem.  History of Present Illness:   Chief Complaint  Patient presents with   Follow-up    Pt was seen in the ED on 10/18 for RUQ pain. Pt is still having RUQ abdominal pain and nausea, blurry vision off and on.     HPI  Constipation/RUQ pain On 10/18 presented to [facility] for RUQ pain rated at 10/10. Assessment and Plan from that visit:  - CBC is unremarkable. CMP slight increase in creatinine, 1.05. push fluids. Otherwise WNL.  Today reports that she is still experiencing RUQ abdominal pain and nausea. Also endorses intermittent blurring vision.  She also was seen 10/06 by Dewaine Conger, FNP who recommended she take Simethicone, Colace, and Miralax.  States that she finished the Colace, didn't take the Simethicone, and is still taking the Miralax but not daily.    Notes she has taken some oils out of her diet, added more plums, but is only taking Miralax 3 times/week.   Describes the pan as burning and feeling like her stomach is inflamed.  Expresses concern about gallstones - endorses pain on palpation of area.  We did an ultrasound last year but she is very concerned about possible gallstones and would like to repeat this  States she drinks 2 cups of coffee, doesn't eat a lot of spicy food, and has never taken anything for heartburn.   Denies pelvic pain, vaginal discharge, co-occurrence of pain and nausea, worsened pain after eating, symptom exacerbation after eating spicy food, or alcohol consumption.   One referral was placed earlier this year for colonoscopy and per records she was contacted via phone, MyChart and home mail but never responded.  She reports that she did not receive these communications.  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   Hyperlipidemia    Hypertension    Stroke (HCC)    09/2015   Vertigo      Social History   Tobacco Use   Smoking status: Never   Smokeless tobacco: Never  Vaping Use   Vaping status: Never Used  Substance Use Topics   Alcohol use: No   Drug use: No   Past Surgical History:  Procedure Laterality Date   RIGHT ARM SURGERY     TEE WITHOUT CARDIOVERSION N/A 10/13/2015   Procedure: TRANSESOPHAGEAL ECHOCARDIOGRAM (TEE);  Surgeon: Lewayne Bunting, MD;  Location: [facility] ENDOSCOPY;  Service: Cardiovascular;  Laterality: N/A;   Family History  Problem Relation Age of Onset   Leukemia Mother    Colon cancer Brother    Anxiety disorder Brother    Asthma Neg Hx    Cancer Neg Hx    Diabetes Neg Hx    Hyperlipidemia Neg Hx    Heart failure Neg Hx    Hypertension Neg Hx    Migraines Neg Hx    Rashes / Skin problems Neg Hx    Seizures Neg Hx    Stroke Neg Hx    Thyroid disease Neg Hx    Allergies  Allergen Reactions   Gabapentin Other (See Comments)    Panic attacks   Pravastatin Anxiety   Tramadol Nausea Only   Current Medications:   Current Outpatient Medications:    amitriptyline (ELAVIL) 25 MG tablet, Take 1 tablet (25 mg total) by mouth at bedtime., Disp: 90 tablet, Rfl: 3  aspirin EC 81 MG tablet, Take 81 mg by mouth daily. Swallow whole., Disp: , Rfl:    meclizine (ANTIVERT) 25 MG tablet, Take 1 tablet (25 mg total) by mouth 2 (two) times daily as needed for dizziness., Disp: 30 tablet, Rfl: 11   metoprolol tartrate (LOPRESSOR) 25 MG tablet, TAKE 1/2 TABLET POR VIA ORAL DOS VECES AL DIA (Patient taking differently: Take 12.5 mg by mouth 2 (two) times daily.), Disp: 90 tablet, Rfl: 1   ondansetron (ZOFRAN-ODT) 4 MG disintegrating tablet, Take 1 tablet (4 mg total) by mouth every 6 (six) hours as needed for nausea or vomiting., Disp: 20 tablet, Rfl: 0   polyethylene glycol (MIRALAX / GLYCOLAX) 17 g packet, Take 17 g by mouth daily as needed., Disp: , Rfl:   Review of Systems:   ROS See pertinent positives and negatives as per the HPI.  Vitals:    Vitals:   09/16/23 1533 09/16/23 1610  BP: (!) 150/90 (!) 136/90  Pulse: 99   SpO2: 99%   Weight: 138 lb 6.1 oz (62.8 kg)   Height: 5\' 5"  (1.651 m)      Body mass index is 23.03 kg/m.  Physical Exam:   Physical Exam Vitals and nursing note reviewed.  Constitutional:      General: She is not in acute distress.    Appearance: She is well-developed. She is not ill-appearing or toxic-appearing.  Cardiovascular:     Rate and Rhythm: Normal rate and regular rhythm.     Pulses: Normal pulses.     Heart sounds: Normal heart sounds, S1 normal and S2 normal.  Pulmonary:     Effort: Pulmonary effort is normal.     Breath sounds: Normal breath sounds.  Abdominal:     General: Abdomen is flat. Bowel sounds are normal.     Palpations: Abdomen is soft.     Tenderness: There is abdominal tenderness in the right upper quadrant. There is no right CVA tenderness, left CVA tenderness, guarding or rebound.  Skin:    General: Skin is warm and dry.  Neurological:     Mental Status: She is alert.     GCS: GCS eye subscore is 4. GCS verbal subscore is 5. GCS motor subscore is 6.  Psychiatric:        Speech: Speech normal.        Behavior: Behavior normal. Behavior is cooperative.     Assessment and Plan:   Constipation, unspecified constipation type I discussed with patient need to take MiraLAX daily Drink 64 ounces of water per day  will also place referral to GI to discuss this issue as well as perform her screening colonoscopy  RUQ pain Will obtain ultrasound for further evaluation If positive for gallstones will refer to general surgeon If negative for gallstones will defer to GI to further evaluate this during their appointment If any worsening symptoms, needs to go to the emergency room  Special screening for malignant neoplasms, colon Referral resubmitted for colonoscopy   I,Emily Lagle,acting as a scribe for Jarold Motto, PA.,have documented all relevant documentation  on the behalf of Jarold Motto, PA,as directed by  Jarold Motto, PA while in the presence of Jarold Motto, [place].   I, Jarold Motto, [place], have reviewed all documentation for this visit. The documentation on 09/16/23 for the exam, diagnosis, procedures, and orders are all accurate and complete.  Jarold Motto, PA-C

## 2023-09-17 ENCOUNTER — Ambulatory Visit (HOSPITAL_BASED_OUTPATIENT_CLINIC_OR_DEPARTMENT_OTHER): Payer: Medicaid Other

## 2023-09-17 LAB — CBC WITH DIFFERENTIAL/PLATELET
Basophils Absolute: 0.1 10*3/uL (ref 0.0–0.1)
Basophils Relative: 0.9 % (ref 0.0–3.0)
Eosinophils Absolute: 0.1 10*3/uL (ref 0.0–0.7)
Eosinophils Relative: 1.3 % (ref 0.0–5.0)
HCT: 42.5 % (ref 36.0–46.0)
Hemoglobin: 13.7 g/dL (ref 12.0–15.0)
Lymphocytes Relative: 39.8 % (ref 12.0–46.0)
Lymphs Abs: 2.7 10*3/uL (ref 0.7–4.0)
MCHC: 32.2 g/dL (ref 30.0–36.0)
MCV: 89.9 fL (ref 78.0–100.0)
Monocytes Absolute: 0.5 10*3/uL (ref 0.1–1.0)
Monocytes Relative: 7.9 % (ref 3.0–12.0)
Neutro Abs: 3.4 10*3/uL (ref 1.4–7.7)
Neutrophils Relative %: 50.1 % (ref 43.0–77.0)
Platelets: 359 10*3/uL (ref 150.0–400.0)
RBC: 4.73 Mil/uL (ref 3.87–5.11)
RDW: 13.2 % (ref 11.5–15.5)
WBC: 6.7 10*3/uL (ref 4.0–10.5)

## 2023-09-17 LAB — COMPREHENSIVE METABOLIC PANEL
ALT: 38 U/L — ABNORMAL HIGH (ref 0–35)
AST: 32 U/L (ref 0–37)
Albumin: 4.5 g/dL (ref 3.5–5.2)
Alkaline Phosphatase: 71 U/L (ref 39–117)
BUN: 13 mg/dL (ref 6–23)
CO2: 31 meq/L (ref 19–32)
Calcium: 9.8 mg/dL (ref 8.4–10.5)
Chloride: 100 meq/L (ref 96–112)
Creatinine, Ser: 1.1 mg/dL (ref 0.40–1.20)
GFR: 57.62 mL/min — ABNORMAL LOW (ref >60.00)
Glucose, Bld: 83 mg/dL (ref 70–99)
Potassium: 4.1 meq/L (ref 3.5–5.1)
Sodium: 138 meq/L (ref 135–145)
Total Bilirubin: 0.4 mg/dL (ref 0.2–1.2)
Total Protein: 7.5 g/dL (ref 6.0–8.3)

## 2023-09-17 LAB — URINALYSIS, ROUTINE W REFLEX MICROSCOPIC
Bilirubin Urine: NEGATIVE
Ketones, ur: NEGATIVE
Leukocytes,Ua: NEGATIVE
Nitrite: NEGATIVE
RBC / HPF: NONE SEEN (ref 0–?)
Specific Gravity, Urine: <=1.005 — AB (ref 1.000–1.030)
Total Protein, Urine: NEGATIVE
Urine Glucose: NEGATIVE
Urobilinogen, UA: 0.2 (ref 0.0–1.0)
WBC, UA: NONE SEEN (ref 0–?)
pH: 6.5 (ref 5.0–8.0)

## 2023-09-17 LAB — LIPASE: Lipase: 59 U/L (ref 11.0–59.0)

## 2023-09-18 ENCOUNTER — Ambulatory Visit (HOSPITAL_BASED_OUTPATIENT_CLINIC_OR_DEPARTMENT_OTHER)
Admission: RE | Admit: 2023-09-18 | Discharge: 2023-09-18 | Disposition: A | Discharge status: Home / Self Care | Payer: Medicaid Other | Source: Ambulatory Visit | Attending: Physician Assistant | Admitting: Physician Assistant

## 2023-09-18 ENCOUNTER — Other Ambulatory Visit (HOSPITAL_BASED_OUTPATIENT_CLINIC_OR_DEPARTMENT_OTHER): Payer: Self-pay | Admitting: Physician Assistant

## 2023-09-18 ENCOUNTER — Other Ambulatory Visit: Payer: Self-pay | Admitting: Physician Assistant

## 2023-09-18 DIAGNOSIS — Z139 Encounter for screening, unspecified: Secondary | ICD-10-CM | POA: Diagnosis present

## 2023-09-18 DIAGNOSIS — R1011 Right upper quadrant pain: Secondary | ICD-10-CM | POA: Insufficient documentation

## 2023-09-18 DIAGNOSIS — R944 Abnormal results of kidney function studies: Secondary | ICD-10-CM

## 2023-09-18 LAB — URINE CULTURE
MICRO NUMBER:: 15626786
Result:: NO GROWTH
SPECIMEN QUALITY:: ADEQUATE

## 2023-09-26 ENCOUNTER — Telehealth: Payer: Self-pay | Admitting: Physician Assistant

## 2023-09-26 NOTE — Telephone Encounter (Signed)
Pt would like a call back with imaging results 

## 2023-09-29 ENCOUNTER — Other Ambulatory Visit: Payer: Self-pay

## 2023-09-29 ENCOUNTER — Emergency Department (HOSPITAL_COMMUNITY): Payer: Medicaid Other

## 2023-09-29 ENCOUNTER — Emergency Department (HOSPITAL_COMMUNITY)
Admission: EM | Admit: 2023-09-29 | Discharge: 2023-09-29 | Disposition: A | Discharge status: Home / Self Care | Payer: Medicaid Other | Attending: Emergency Medicine | Admitting: Emergency Medicine

## 2023-09-29 ENCOUNTER — Encounter (HOSPITAL_COMMUNITY): Payer: Self-pay | Admitting: Emergency Medicine

## 2023-09-29 DIAGNOSIS — Z7982 Long term (current) use of aspirin: Secondary | ICD-10-CM | POA: Diagnosis not present

## 2023-09-29 DIAGNOSIS — R1011 Right upper quadrant pain: Secondary | ICD-10-CM | POA: Diagnosis present

## 2023-09-29 DIAGNOSIS — I10 Essential (primary) hypertension: Secondary | ICD-10-CM | POA: Insufficient documentation

## 2023-09-29 DIAGNOSIS — R1013 Epigastric pain: Secondary | ICD-10-CM | POA: Insufficient documentation

## 2023-09-29 DIAGNOSIS — Z8673 Personal history of transient ischemic attack (TIA), and cerebral infarction without residual deficits: Secondary | ICD-10-CM | POA: Insufficient documentation

## 2023-09-29 DIAGNOSIS — Z79899 Other long term (current) drug therapy: Secondary | ICD-10-CM | POA: Diagnosis not present

## 2023-09-29 LAB — COMPREHENSIVE METABOLIC PANEL
ALT: 34 U/L (ref 0–44)
AST: 27 U/L (ref 15–41)
Albumin: 4 g/dL (ref 3.5–5.0)
Alkaline Phosphatase: 85 U/L (ref 38–126)
Anion gap: 6 (ref 5–15)
BUN: 9 mg/dL (ref 6–20)
CO2: 29 mmol/L (ref 22–32)
Calcium: 9.5 mg/dL (ref 8.9–10.3)
Chloride: 104 mmol/L (ref 98–111)
Creatinine, Ser: 0.84 mg/dL (ref 0.44–1.00)
GFR, Estimated: >60 mL/min (ref >60)
Glucose, Bld: 113 mg/dL — ABNORMAL HIGH (ref 70–99)
Potassium: 3.6 mmol/L (ref 3.5–5.1)
Sodium: 139 mmol/L (ref 135–145)
Total Bilirubin: 0.3 mg/dL (ref <1.2)
Total Protein: 7.1 g/dL (ref 6.5–8.1)

## 2023-09-29 LAB — URINALYSIS, ROUTINE W REFLEX MICROSCOPIC
Bacteria, UA: NONE SEEN
Bilirubin Urine: NEGATIVE
Glucose, UA: NEGATIVE mg/dL
Ketones, ur: NEGATIVE mg/dL
Leukocytes,Ua: NEGATIVE
Nitrite: NEGATIVE
Protein, ur: NEGATIVE mg/dL
Specific Gravity, Urine: 1.003 — ABNORMAL LOW (ref 1.005–1.030)
pH: 7 (ref 5.0–8.0)

## 2023-09-29 LAB — CBC
HCT: 40.2 % (ref 36.0–46.0)
Hemoglobin: 13 g/dL (ref 12.0–15.0)
MCH: 28.8 pg (ref 26.0–34.0)
MCHC: 32.3 g/dL (ref 30.0–36.0)
MCV: 88.9 fL (ref 80.0–100.0)
Platelets: 344 10*3/uL (ref 150–400)
RBC: 4.52 MIL/uL (ref 3.87–5.11)
RDW: 12.3 % (ref 11.5–15.5)
WBC: 7.4 10*3/uL (ref 4.0–10.5)
nRBC: 0 % (ref 0.0–0.2)

## 2023-09-29 LAB — LIPASE, BLOOD: Lipase: 43 U/L (ref 11–51)

## 2023-09-29 MED ORDER — ALUM & MAG HYDROXIDE-SIMETH 200-200-20 MG/5ML PO SUSP
30.0000 mL | Freq: Once | ORAL | Status: AC
  Administered 2023-09-29: 30 mL via ORAL
  Filled 2023-09-29: qty 30

## 2023-09-29 MED ORDER — DICYCLOMINE HCL 10 MG PO CAPS
10.0000 mg | ORAL_CAPSULE | Freq: Once | ORAL | Status: AC
  Administered 2023-09-29: 10 mg via ORAL
  Filled 2023-09-29: qty 1

## 2023-09-29 MED ORDER — IOHEXOL 350 MG/ML SOLN
75.0000 mL | Freq: Once | INTRAVENOUS | Status: AC | PRN
  Administered 2023-09-29: 75 mL via INTRAVENOUS

## 2023-09-29 NOTE — Telephone Encounter (Signed)
[name] is calling her, see result notes.

## 2023-09-29 NOTE — ED Triage Notes (Signed)
Pt reports RUQ abdominal pain with n/v and abdominal swelling x 1 month.

## 2023-09-29 NOTE — Discharge Instructions (Addendum)
CALL [phone] ([facility]) TO MAKE AN APPOINTMENT  Please take TYLENOL as needed for pain.   DO NOT TAKE IBUPROFEN - it may irritate your stomach.   You can also try Lidocaine Patches on your lower ribs where there is pain.

## 2023-09-29 NOTE — ED Provider Notes (Signed)
[place] EMERGENCY DEPARTMENT AT [facility] Provider Note   CSN: [id number] Arrival date & time: 09/29/23  1548     History  Chief Complaint  Patient presents with   Abdominal Pain    Carla [name] is a 52 y.o. female with past medical history of HLD, HTN, cryptogenic stroke, cavernous hemangioma of the liver who presents to the ED for worsening right upper quadrant abdominal pain.  Patient states she has had a month of right upper quadrant abdominal pain and is being evaluated by her PCP in the outpatient setting for the symptoms.  Seen here 10/18 for similar.  Patient had labs at that time that were unremarkable, UA that was unremarkable, imaging not done at that time.   She has previously been referred to GI but has never made appointment. Was provided with the clinic number after last ED visit and urged to call.  States that she called this number but nobody picked up.  Had a right upper quadrant ultrasound 10/24 as an outpatient that showed no gallstones, normal gallbladder, no acute abnormalities of the liver.  Redemonstrated right hepatic lobe hemangiomas (2 of them) stable from CT A/P on 5/10.   She is presenting to the ED today due to worsening of her symptoms.  Her PCP told her to come to the ED if her pain got worse.  Pain worsened at 1:30 PM today while patient was at work.  Is not related to meals.  She denies any vomiting but does have nausea.  No diarrhea.  No urinary symptoms.  No vaginal itching or discharges.  No fevers or chills.  Eating and drinking at baseline.   I asked further about her history of stroke.  No clear etiology was identified, though she is hyperlipidemic.  Unknown if she has been worked up for hypercoagulability.  She denies any history of PE, DVT, or other known clotting issues.  Denies family history of clotting issues.   The history is provided by the patient and medical records.       Home Medications Prior to Admission  medications   Medication Sig Start Date End Date Taking? Authorizing Provider  amitriptyline (ELAVIL) 25 MG tablet Take 1 tablet (25 mg total) by mouth at bedtime. 06/11/23   Ihor Austin, NP  aspirin EC 81 MG tablet Take 81 mg by mouth daily. Swallow whole.    [provider]  meclizine (ANTIVERT) 25 MG tablet Take 1 tablet (25 mg total) by mouth 2 (two) times daily as needed for dizziness. 06/11/23   Ihor Austin, NP  metoprolol tartrate (LOPRESSOR) 25 MG tablet TAKE 1/2 TABLET POR VIA ORAL DOS VECES AL DIA Patient taking differently: Take 12.5 mg by mouth 2 (two) times daily. 05/12/23   Jarold Motto, PA  ondansetron (ZOFRAN-ODT) 4 MG disintegrating tablet Take 1 tablet (4 mg total) by mouth every 6 (six) hours as needed for nausea or vomiting. 09/12/23   Rising, Rebecca, PA-C  polyethylene glycol (MIRALAX / GLYCOLAX) 17 g packet Take 17 g by mouth daily as needed.    [provider]      Allergies    Gabapentin, Pravastatin, and Tramadol    Review of Systems   Review of Systems as per HPI above  Physical Exam Updated Vital Signs BP (!) 145/86   Pulse 72   Temp 98 F (36.7 C)   Resp 16   LMP 01/16/2020   SpO2 100%  Physical Exam Vitals and nursing note reviewed.  Constitutional:  Appearance: She is well-developed.  HENT:     Head: Normocephalic and atraumatic.     Mouth/Throat:     Mouth: Mucous membranes are moist.  Eyes:     Extraocular Movements: Extraocular movements intact.     Pupils: Pupils are equal, round, and reactive to light.  Cardiovascular:     Rate and Rhythm: Normal rate and regular rhythm.     Heart sounds: Normal heart sounds.  Pulmonary:     Effort: Pulmonary effort is normal.     Breath sounds: Normal breath sounds.  Abdominal:     General: Abdomen is flat.     Palpations: Abdomen is soft.     Tenderness: There is abdominal tenderness in the right upper quadrant and epigastric area. There is no right CVA tenderness, left  CVA tenderness, guarding or rebound. Negative signs include Murphy's sign, Rovsing's sign and McBurney's sign.     Hernia: No hernia is present.  Skin:    General: Skin is warm and dry.     Capillary Refill: Capillary refill takes less than 2 seconds.  Neurological:     General: No focal deficit present.     Mental Status: She is alert.     Cranial Nerves: No cranial nerve deficit.     Motor: No weakness.     ED Results / Procedures / Treatments   Labs (all labs ordered are listed, but only abnormal results are displayed) Labs Reviewed  COMPREHENSIVE METABOLIC PANEL - Abnormal; Notable for the following components:      Result Value   Glucose, Bld 113 (*)    All other components within normal limits  URINALYSIS, ROUTINE W REFLEX MICROSCOPIC - Abnormal; Notable for the following components:   Color, Urine COLORLESS (*)    Specific Gravity, Urine 1.003 (*)    Hgb urine dipstick SMALL (*)    All other components within normal limits  LIPASE, BLOOD  CBC    EKG None  Radiology CT ABDOMEN PELVIS W CONTRAST  Result Date: 09/29/2023 CLINICAL DATA:  Right upper quadrant abdominal pain EXAM: CT ABDOMEN AND PELVIS WITH CONTRAST TECHNIQUE: Multidetector CT imaging of the abdomen and pelvis was performed using the standard protocol following bolus administration of intravenous contrast. RADIATION DOSE REDUCTION: This exam was performed according to the departmental dose-optimization program which includes automated exposure control, adjustment of the mA and/or kV according to patient size and/or use of iterative reconstruction technique. CONTRAST:  75mL OMNIPAQUE IOHEXOL 350 MG/ML SOLN COMPARISON:  04/04/2023 FINDINGS: Lower chest: No acute abnormality. Hepatobiliary: There are 2 adjacent masses within segments 5, 6, and 7 of the liver demonstrating peripheral nodular enhancement and gradual fill-in in keeping with benign cavernous hemangioma. Lesions measure 6.9 x 10.7 x 9.2 cm and 4.8 x 5.8  x 6.2 cm. These are unchanged from prior examination. No other enhancing intrahepatic mass identified. No intra or extrahepatic biliary ductal dilation. Gallbladder unremarkable. Pancreas: Unremarkable Spleen: Unremarkable Adrenals/Urinary Tract: Adrenal glands are unremarkable. Kidneys are normal, without renal calculi, focal lesion, or hydronephrosis. Bladder is unremarkable. Stomach/Bowel: Stomach is within normal limits. Appendix appears normal. No evidence of bowel wall thickening, distention, or inflammatory changes. Vascular/Lymphatic: The abdominal vasculature is unremarkable save for a retroaortic left renal vein. No pathologic adenopathy within the abdomen and pelvis. Reproductive: Uterus and bilateral adnexa are unremarkable. Other: No abdominal wall hernia or abnormality. No abdominopelvic ascites. Musculoskeletal: No acute or significant osseous findings. IMPRESSION: 1. No acute intra-abdominal pathology identified. 2. Stable benign cavernous hemangiomas within segments  5, 6, and 7 of the liver. In the symptomatic patient, interventional radiology consultation may be helpful for further consideration of endovascular therapy. Electronically Signed   By: Helyn Numbers M.D.   On: 09/29/2023 21:01    Procedures Procedures    Medications Ordered in ED Medications  iohexol (OMNIPAQUE) 350 MG/ML injection 75 mL (75 mLs Intravenous Contrast Given 09/29/23 2033)  alum & mag hydroxide-simeth (MAALOX/MYLANTA) 200-200-20 MG/5ML suspension 30 mL (30 mLs Oral Given 09/29/23 2220)  dicyclomine (BENTYL) capsule 10 mg (10 mg Oral Given 09/29/23 2220)    ED Course/ Medical Decision Making/ A&P                                 Medical Decision Making Amount and/or Complexity of Data Reviewed Labs: ordered. Decision-making details documented in ED Course. Radiology: ordered and independent interpretation performed. Decision-making details documented in ED Course.  Risk OTC drugs. Prescription drug  management.   52 year old female who presents as above with worsening right upper quadrant abdominal pain.  Has previously been evaluated for similar symptoms.  Had a negative right upper quadrant ultrasound on 10/24.  Unclear etiology for symptoms, however biliary pathology is unlikely based on her negative ultrasound.  Other items on differential considered includes abnormally presenting appendicitis, hepatitis, gastritis, PUD, vascular occlusive liver disease, constipation, gastroenteritis, pyelonephritis, kidney stone, other unspecified chronic abdominal pain.  Also considered possibility for atypical infection however on prior imaging there was no evidence of liver abscess and she denies out of country travel since imaging has been obtained.   Labs are obtained and are notable for no leukocytosis, normal hemoglobin and platelets, normal electrolytes glucose renal function, normal LFTs not consistent with hepatitis or congestive hepatopathy, lipase is normal not consistent with pancreatitis, UA shows a small amount of hemoglobin but is otherwise not consistent with UTI and there is no significant RBCs to suggest large kidney stone.   Patient's last CAT scan was in May.  She states that the symptoms now are different than they were then. Through shared decision making decided to obtain CT scan of abdomen pelvis today; will evaluate for kidney stone, abscess, appendicitis, vascular issues.  This was obtained and is notable for no acute abnormality to explain her symptoms.   Unclear etiology for symptoms.  Given GI cocktail and on reassessment symptoms are mildly improved.  She continues to be hemodynamically stable with no evidence of peritonitis on repeat abdominal exam.  I updated the patient about her reassuring CT findings and reassuring workup.  Recommended GI follow-up for endoscopy, avoidance of NSAIDs in case her symptoms are related to gastritis.  Gave her the phone number for [facility] in her  discharge paperwork and instructed her to call at 8 AM tomorrow when their office opens.  Recommended she continue taking omeprazole daily which she currently is on. Strict return precautions were discussed.  She was discharged from the ED in stable condition.         Final Clinical Impression(s) / ED Diagnoses Final diagnoses:  RUQ pain    Rx / DC Orders ED Discharge Orders          Ordered    Ambulatory referral to Gastroenterology        09/29/23 2216              Karmen Stabs, MD 09/29/23 2249    Blane Ohara, MD 10/06/23 [phone]

## 2023-09-30 ENCOUNTER — Encounter: Payer: Self-pay | Admitting: Gastroenterology

## 2023-10-01 NOTE — Telephone Encounter (Signed)
[name] has been calling her, just tried again and left message. When pt calls back please give to [facility].

## 2023-10-01 NOTE — Telephone Encounter (Signed)
Pt called again and states no one has called her back with imaging results.

## 2023-12-02 ENCOUNTER — Ambulatory Visit (INDEPENDENT_AMBULATORY_CARE_PROVIDER_SITE_OTHER): Payer: Medicaid Other | Admitting: Physician Assistant

## 2023-12-02 ENCOUNTER — Encounter: Payer: Self-pay | Admitting: Physician Assistant

## 2023-12-02 VITALS — BP 150/96 | HR 96 | Temp 98.1°F | Ht 65.0 in | Wt 141.0 lb

## 2023-12-02 DIAGNOSIS — I10 Essential (primary) hypertension: Secondary | ICD-10-CM | POA: Diagnosis not present

## 2023-12-02 DIAGNOSIS — J020 Streptococcal pharyngitis: Secondary | ICD-10-CM | POA: Diagnosis not present

## 2023-12-02 DIAGNOSIS — R6889 Other general symptoms and signs: Secondary | ICD-10-CM | POA: Diagnosis not present

## 2023-12-02 DIAGNOSIS — L299 Pruritus, unspecified: Secondary | ICD-10-CM

## 2023-12-02 LAB — POCT INFLUENZA A/B
Influenza A, POC: NEGATIVE
Influenza B, POC: NEGATIVE

## 2023-12-02 LAB — POC COVID19 BINAXNOW: SARS Coronavirus 2 Ag: NEGATIVE

## 2023-12-02 LAB — POCT RAPID STREP A (OFFICE): Rapid Strep A Screen: POSITIVE — AB

## 2023-12-02 MED ORDER — DESONIDE 0.05 % EX CREA: TOPICAL_CREAM | Freq: Two times a day (BID) | CUTANEOUS | 0 refills | Status: AC

## 2023-12-02 MED ORDER — AMOXICILLIN 500 MG PO CAPS
500.0000 mg | ORAL_CAPSULE | Freq: Two times a day (BID) | ORAL | 0 refills | Status: AC
Start: 2023-12-02 — End: 2023-12-12

## 2023-12-02 NOTE — Patient Instructions (Addendum)
 It was great to see you!  Strep test is positive - start oral antibiotic(s)   Stop the zyrtec   We will update blood work results today  We will check you for COVID, strep and flu  Please check your blood pressure whenever you feel bad - we need to make sure your blood pressure is consistently <130/90  Trial the cream for your itchy ears  Follow-up in 1-3 months, sooner if concerns  Take care,  Bellarose Burtt PA-C

## 2023-12-02 NOTE — Progress Notes (Signed)
 Carla [name] is a 53 y.o. female here for a new problem.  History of Present Illness:   Chief Complaint  Patient presents with   Ear Problem    Pt c/o bilateral ears itching.   Nausea    Pt c/o constant nausea x 1 week, no vomiting.   Patient with in-person interpreter in office.  HPI  Nausea  She complains today of constant nausea that has persisted for the past week. She denies any accompanying vomiting and vertigo. She reports starting Antivert  25 mg a week ago to help relief her symptoms.  She does report experiencing some dental pain. She last followed up with the dentist this past November where she got a bridge done along with teeth extractions.  She does report exposure to a coworker with a viral virus.   Itchy Ears  She also complains of an itching sensation in both ears.  She has been taking Zyrtec  as needed for the past few days to help relief her symptoms.  She states that she occasionally experiences chills, fatigue, and swelling in the face. She also complains of a cough and a runny nose along with balance issues. She denies any fever, discharge from ears. She states that her other symptoms have been worsening since starting the allergy medicine.  Strep throat test in office today is positive.  HTN  She states that she's has not been regularly checking her blood pressure but reports compliance with metoprolol  tartrate 25 mg twice daily and its tolerating it well.     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   Hyperlipidemia    Hypertension    Stroke (HCC)    09/2015   Vertigo      Social History   Tobacco Use   Smoking status: Never   Smokeless tobacco: Never  Vaping Use   Vaping status: Never Used  Substance Use Topics   Alcohol use: No   Drug use: No    Past Surgical History:  Procedure Laterality Date   RIGHT ARM SURGERY     TEE WITHOUT CARDIOVERSION N/A 10/13/2015    Procedure: TRANSESOPHAGEAL ECHOCARDIOGRAM (TEE);  Surgeon: Redell GORMAN Shallow, MD;  Location: [facility] ENDOSCOPY;  Service: Cardiovascular;  Laterality: N/A;    Family History  Problem Relation Age of Onset   Leukemia Mother    Colon cancer Brother    Anxiety disorder Brother    Asthma Neg Hx    Cancer Neg Hx    Diabetes Neg Hx    Hyperlipidemia Neg Hx    Heart failure Neg Hx    Hypertension Neg Hx    Migraines Neg Hx    Rashes / Skin problems Neg Hx    Seizures Neg Hx    Stroke Neg Hx    Thyroid  disease Neg Hx     Allergies  Allergen Reactions   Gabapentin  Other (See Comments)    Panic attacks   Pravastatin  Anxiety   Tramadol  Nausea Only    Current Medications:   Current Outpatient Medications:    amitriptyline  (ELAVIL ) 25 MG tablet, Take 1 tablet (25 mg total) by mouth at bedtime., Disp: 90 tablet, Rfl: 3   amoxicillin  (AMOXIL ) 500 MG capsule, Take 1 capsule (500 mg total) by mouth 2 (two) times daily for 10 days., Disp: 20 capsule, Rfl: 0   aspirin  EC 81 MG tablet, Take 81 mg by mouth daily. Swallow whole., Disp: ,  Rfl:    desonide  (DESOWEN ) 0.05 % cream, Apply topically 2 (two) times daily., Disp: 30 g, Rfl: 0   meclizine  (ANTIVERT ) 25 MG tablet, Take 1 tablet (25 mg total) by mouth 2 (two) times daily as needed for dizziness., Disp: 30 tablet, Rfl: 11   metoprolol  tartrate (LOPRESSOR ) 25 MG tablet, TAKE 1/2 TABLET POR VIA ORAL DOS VECES AL DIA (Patient taking differently: Take 12.5 mg by mouth 2 (two) times daily.), Disp: 90 tablet, Rfl: 1   ondansetron  (ZOFRAN -ODT) 4 MG disintegrating tablet, Take 1 tablet (4 mg total) by mouth every 6 (six) hours as needed for nausea or vomiting. (Patient not taking: Reported on 12/02/2023), Disp: 20 tablet, Rfl: 0   Review of Systems:   Review of Systems  Constitutional:  Positive for chills and malaise/fatigue. Negative for fever.  HENT:  Positive for congestion and ear pain.   Respiratory:  Positive for cough.   Gastrointestinal:   Positive for nausea. Negative for vomiting.  Neurological:  Positive for dizziness. Negative for tremors.    Vitals:   Vitals:   12/02/23 1322 12/02/23 1407  BP: (!) 148/100 (!) 150/96  Pulse: 96   Temp: 98.1 F (36.7 C)   TempSrc: Temporal   SpO2: 98%   Weight: 141 lb (64 kg)   Height: 5' 5 (1.651 m)      Body mass index is 23.46 kg/m.  Physical Exam:   Physical Exam Vitals and nursing note reviewed.  Constitutional:      General: She is not in acute distress.    Appearance: She is well-developed. She is not ill-appearing or toxic-appearing.  HENT:     Head: Normocephalic and atraumatic.     Right Ear: Tympanic membrane, ear canal and external ear normal. Tympanic membrane is not erythematous, retracted or bulging.     Left Ear: Tympanic membrane, ear canal and external ear normal. Tympanic membrane is not erythematous, retracted or bulging.     Nose: Nose normal.     Right Sinus: No maxillary sinus tenderness or frontal sinus tenderness.     Left Sinus: No maxillary sinus tenderness or frontal sinus tenderness.     Mouth/Throat:     Dentition: Dental caries present.     Pharynx: Uvula midline. Posterior oropharyngeal erythema present.  Eyes:     General: Lids are normal.     Conjunctiva/sclera: Conjunctivae normal.  Neck:     Trachea: Trachea normal.  Cardiovascular:     Rate and Rhythm: Normal rate and regular rhythm.     Heart sounds: Normal heart sounds, S1 normal and S2 normal.  Pulmonary:     Effort: Pulmonary effort is normal.     Breath sounds: Normal breath sounds. No decreased breath sounds, wheezing, rhonchi or rales.  Lymphadenopathy:     Cervical: No cervical adenopathy.  Skin:    General: Skin is warm and dry.     Comments: Excoriations around outer ear canal b/l  Neurological:     Mental Status: She is alert.  Psychiatric:        Speech: Speech normal.        Behavior: Behavior normal. Behavior is cooperative.    Results for orders placed  or performed in visit on 12/02/23  POCT rapid strep A  Result Value Ref Range   Rapid Strep A Screen Positive (A) Negative  POCT Influenza A/B  Result Value Ref Range   Influenza A, POC Negative Negative   Influenza B, POC Negative Negative  POC COVID-19  Result Value Ref Range   SARS Coronavirus 2 Ag Negative Negative    Assessment and Plan:   Essential hypertension Above goal today No evidence of end-organ damage on my exam Recommend patient monitor home blood pressure at least a few times weekly Continue metoprolol  tartrate 25 mg twice daily  If home monitoring shows consistent elevation, or any symptom(s) develop, recommend reach out to us  for further advice on next steps Recommend follow-up in 1-3 months, sooner if concerns  Itching of ear Recommend discontinuing allergy pill since this is causing some concerning symptom(s) for her Recommend topical desonide  to area and provide recommendations  Flu-like symptoms No red flags on exam.   Strep test is positive Will initiate amoxicillin  for positive strep per orders.  Discussed taking medications as prescribed.  Reviewed return precautions including new or worsening fever, SOB, new or worsening cough or other concerns.  Push fluids and rest.  I recommend that patient follow-up if symptoms worsen or persist despite treatment x 7-10 days, sooner if needed.   I,Safa M Kadhim,acting as a scribe for [employer], PA.,have documented all relevant documentation on the behalf of Lucie Buttner, PA,as directed by  Lucie Buttner, PA while in the presence of Lucie Buttner, [place].   I, Lucie Buttner, [place], have reviewed all documentation for this visit. The documentation on 12/02/23 for the exam, diagnosis, procedures, and orders are all accurate and complete.   I spent a total of 55 minutes on this visit, today 12/02/23, which included reviewing prior notes from neurology, ordering tests, discussing plan of care with patient and  using shared-decision making on next steps, and documenting the findings in the note.   Lucie Buttner, PA-C

## 2023-12-15 ENCOUNTER — Ambulatory Visit: Payer: Medicaid Other | Admitting: Adult Health

## 2023-12-16 ENCOUNTER — Ambulatory Visit: Payer: Medicaid Other | Admitting: Adult Health

## 2023-12-16 ENCOUNTER — Encounter: Payer: Self-pay | Admitting: Adult Health

## 2023-12-16 VITALS — BP 131/83 | HR 90 | Ht 62.0 in | Wt 140.0 lb

## 2023-12-16 DIAGNOSIS — Z8673 Personal history of transient ischemic attack (TIA), and cerebral infarction without residual deficits: Secondary | ICD-10-CM | POA: Diagnosis not present

## 2023-12-16 DIAGNOSIS — R42 Dizziness and giddiness: Secondary | ICD-10-CM

## 2023-12-16 DIAGNOSIS — G43109 Migraine with aura, not intractable, without status migrainosus: Secondary | ICD-10-CM

## 2023-12-16 MED ORDER — MECLIZINE HCL 25 MG PO TABS: 25.0000 mg | ORAL_TABLET | Freq: Two times a day (BID) | ORAL | 11 refills | Status: DC | PRN

## 2023-12-16 MED ORDER — AMITRIPTYLINE HCL 25 MG PO TABS: 25.0000 mg | ORAL_TABLET | Freq: Every day | ORAL | 3 refills | Status: DC

## 2023-12-16 NOTE — Progress Notes (Signed)
[name] [address] [place]. [place] [address] [phone]       OFFICE FOLLOW UP NOTE  Carla [name] Date of Birth:  1971-11-17 Medical Record Number:  [id number]   [facility] provider: Dr. Pearlean Brownie Reason for visit:: hx of stroke, migraines, vertigo  Chief Complaint  Patient presents with   Follow-up    Pt in 3 with interpreter Pt here for Migraine f/u  Pt states when meclizine was not helping Pt states only takes 1 daily as needed Informed pt  prescribed take 2x daily as needed Pt is requesting head to toe evaluation.  Pt states no migraines in last month      HPI:  Update 12/16/2023 JM: Patient returns for follow-up visit after prior visit 6 months ago.  She is accompanied by [facility] interpreter.  At prior visit, she was started on Emgality for continued migraines in addition to amitriptyline.  She notified office in August stating Emgality causing nausea, discussed switching to Ajovy but patient declined further injections therefore recommended Qulipta, patient reported insurance expiring soon therefore proceeded with gabapentin 300 mg nightly.  Patient called back several weeks later reporting gabapentin causing panic attacks midday.    Main concerns today is in regards to continued vertigo and allergies.   Did not do vestibular therapy (previously ordered by PCP) due to losing health coverage but she now has insurance again and interested in pursing. Continues on meclizine, has been taking 1 tablet/day as needed with little benefit.    She was encouraged to f/u with her PCP regarding allergies, she has been taking Zyrtec for concern of side effects.    Has not had any recent headaches or migraines. Continues on amitriptyline 25mg  nightly.  Continue to have memory loss, unchanged since prior visit.   Denies new stroke/TIA symptoms.  Remains on aspirin and fenofibrate.  Routinely follows with PCP for stroke risk factor management.      History  provided for reference purposes only Update 06/11/2023 JM: Patient returns for follow-up visit after prior visit over 1.5 years ago.  She is accompanied by interpreter.  Reports 3-4 migraines per month, she continues on amitriptyline but questions other treatment options as she complains of severe dry mouth. Will use Tylenol with benefit.  Typically last 2 to 2.5 hours. Can worsen with increased stress.  No change in migraine characteristics since prior visit.  Continued memory difficulties such as when she is driving she will forget where she is going and also difficulty remembering appointments.  This is unchanged since prior visit.  Vertigo - c/o recent worsening with PCP who referred to vestibular rehab.  Continues meclizine only as needed with benefit.   Stable from stroke standpoint.  Denies new stroke/TIA symptoms.  Compliant on aspirin and fenofibrate.  Routinely follows with PCP for stroke risk factor management.  Update 10/17/2021 JM: Returns for 18-month follow-up accompanied by [facility] interpreter  Migraines stable - reports 2-3 migraines per month.  Remains on amitriptyline 25 mg nightly Does endorse awakening with very mild headache every morning  Vertigo stable as long as she takes meclizine once daily. She did try to stop meclizine but felt nauseous without it. Feels like liquid is draining from left ear - was seen by PCP back in August regarding this concern - she was referred to vestibular rehab due to worsening vertigo but did not attend due to lack of insurance. Reports when liquid is coming from ears is when she will feel nauseous and loses  balance. Reports seeing a doctor for this concern last year but unsure if this was a specialist (unable to view via epic)  She has also been experiencing forgetfulness which is not new. Examples include she will forget what she is supposed to be doing in the moment but will remember later on. She may misplace items but again remember where  she placed them later on. Reports sleeping well at night but is fatigued during the day. Reports husband has told her she snores. No witnessed apneas.   No new stroke/TIA symptoms. Compliant on aspirin 81 mg daily and fenofibrate -denies side effects Recent LDL 116 (PCP increased fenofibrate from 54mg  to 145 mg daily) Blood pressure today 144/86  No further concerns at this time   Update 04/16/2021 JM: Carla [name] returns for 30-month follow-up accompanied by [facility] interpreter.  Migraines have been stable - reports 1-2 per month typically lasting 30-40 minutes. Will use tylenol on occasion with benefit. She has remained on amitrityline but reports not being on effexor (denies ever being on it)  Vertigo stable occurring 1-2x weekly - will use meclizine only as needed with benefit  Stable from stroke standpoint without new stroke/TIA symptoms.  Reports intermittent use of aspirin 81mg  daily and daily use of fenofibrate without associated side effects.  PCP recently recommended started atorvastatin but she denies receiving prescription due to lipid panel showing LDL 122.  She has trialed atorvastatin in the past but unsure if reported side effects at that time from statin or multiple other medications being taken at this time.  She is willing to trial again.  Blood pressure today 128/87.    No further concerns at this time   Update 10/16/2020 JM: Carla [name] returns for 34-month follow-up accompanied by interpreter.  At prior visit, complaints of continued migraines which have since greatly improved with use of amitriptyline 25 mg nightly and venlafaxine 37.5 mg daily tolerating well without side effects.  Reports 1 migraine over the past month.  She does report occasional tension type headaches but has been told likely due to new rx lenses and have been slowly improving.  Prior complaints of intermittent vertigo but overall stable and will take meclizine only as needed.  She does report  evaluation by ENT Dr. Suszanne Conners for vertigo approximately 3 to 4 months ago but has not received results.  Stable from TIA/stroke standpoint without new or reoccurring stroke/TIA symptoms.  Remains on aspirin 81 mg daily and omega-3 for secondary stroke prevention of side effects.  Blood pressure today 125/76.  No further concerns at this time.  Update 06/27/2020 JM: Carla [name] returns for follow-up accompanied by interpreter regarding reoccurrence of chronic migraines with underlying history of migraines, cryptogenic stroke 2016, TIA, vertigo, depression and anxiety. Evaluated at [facility] ED on 06/13/2020 due to pain and numbness in her left arm accompanied by headache. All work-up unremarkable including D-dimer, EKG, chest x-ray and CT head and treated for presumed cervical radiculopathy with naproxen and advised to follow-up with PCP.  Evaluated by PCP 06/19/2020 with complete resolution of symptoms. Of note, presented to ED on 03/16/2020 with chief complaint of confusion and left arm numbness with headache and diagnosed with complex migraine. Migraine occurrence has improved since prior visit with ongoing use of amitriptyline 10 mg nightly.  Severe migraine occurrence 4 -5 times per months but does experience tension type headaches almost every other day lasting for approximately 1 hour and are not debilitating.  Reports tension type headaches occur with increased stress  or anxiety.  She has not had any reoccurrence of left arm numbness or any other neurological symptoms Intermittent vertigo and PCP recently referred to ENT She remains uninsured but is currently in the process of applying for Medicaid   Update 03/21/2020 JM, Carla [name] is being seen today accompanied by her son who assist with interpretation due to recent emergency room visit with left arm numbness and confusion accompanied by headache on 03/16/2020.  MRI brain negative for acute intracranial abnormality.  She was diagnosed with complex migraines as  symptoms objective and no focal deficit identified.  She was advised to follow-up outpatient for further evaluation.  Prior medical history of cryptogenic stroke 2016, TIA, vertigo and migraines.  Previously treated in this office for migraine management on Topamax 25 mg twice daily but self discontinued due to difficulty tolerating.  She has not had any difficulties with migraines in over the past year and has not required any treatment.  Typical migraines recurred approximately 2 weeks ago located right temporal and periorbital area with pulsating quality with blurred vision, photophobia, phonophobia and nausea.  Symptoms consistent with previously known migraines.  She may also experience pain in occipital region.  She has been experiencing migraines every other day which last up until 2 hours after laying down and resting.  She has not had any recurrence of confusion or numbness/tingling.  She also reports symptoms of fatigue, dizziness, and shortness of breath which appears to be anxiety related per son.  She also recently self discontinued lovastatin due to subjective increased anxiety after taking.  Previously discussed underlying anxiety at prior visits which likely contributing and/or worsening symptoms but she has previously declined further management or counseling.  She has continued on aspirin 81 mg daily without bleeding or bruising.  Blood pressure today 128/83.  No further concerns at this time.   Initial visit 09/29/2018 Dr. Pearlean Brownie:  Carla [name] is a pleasant 68 year Hispanic lady originally from [place] who was seen today for evaluation for headaches.  She is accompanied by her friend as well as Spanish language interpreter who provides translation for this visit.  History is obtained from them, review of electronic medical records and have personally reviewed imaging films.  Patient has been complaining of increasing headaches for the last 2 months.  She describes this headache as being  right temporal and frontal mostly throbbing in nature though fluctuating in severity from 6/10 to 10/10.  Headache is accompanied by nausea vomiting light and sound sensitivity.  She has to stop her activities and lie down and sleeping as well as taking Tylenol seems to help.  Headaches are accompanied by a single vision spots and scintillations and blurred vision.  She denies any speech difficulties, vertigo, diplopia, gait or balance difficulties with headaches.  Headaches usually last about 30 to 40 minutes.  Headaches of late have been occurring about 2-3 times per week.  She does give a remote history of infrequent headaches which sound like migraine headaches and was similar but were mostly perimenstrual and not bad.  The patient also states in September 2019 she had a episode of speech difficulties as well as dizziness.  She was seen by primary care physician who states that the patient had a headache as well as feeling of dizziness and some speech difficulties.  This happened following a stressful event.  An outpatient MRI was ordered which was done on 08/05/2018 which I personally reviewed shows a few nonspecific white matter hyperintensities but no acute  abnormality or definite stroke.  Patient states her symptoms improved within a few days but the headaches have persisted since then.  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 MRA of the brain showed slight diminished peripheral branches in the left MCA but no large vessel stenosis or occlusion. Carotid ultrasound showed no significant extracranial stenosis. Transthoracic echo showed normal ejection fraction. Transesophageal echocardiogram showed no cardiac source of embolism or PFO. Antiphospholipid antibodies and lupus anticoagulant were both negative. Lipid profile  showed elevated total cholesterol 207 and LDL 139 mg percent. Hemoglobin A1c was 5.6. Patient was started on aspirin as well as protocol. She states her speech difficulty and right-sided weakness completely improved.  She was placed on aspirin and pravastatin which she actually did well on but for unclear reason she discontinued both medications.  She is currently on Lipitor 40 which was started a few months ago by primary physician but she is complaining of dizziness on this medication and wants to change it or stop it.  She states she had slight balance difficulties from her previous stroke but had recovered well from that.  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  She states her blood pressure is under good control.  Last lipid profile checked in August 2019 showed LDL of 120 following which Lipitor was added.  I had recommended doing a transcranial Doppler bubble study at last visit with me in 2017 but for some unclear reason this was never done.  Update 12/30/2018: Carla [name] is being seen today for follow-up visit for migraines and is accompanied by interpreter as patient is primarily Spanish-speaking.  Upon initial visit with Dr. Pearlean Brownie on 09/29/2018 (visit info below), headaches were not frequent enough to justify initiating migraine prophylaxis.  She did call back on 12/02/2018 reporting daily migraines and therefore initiated Topamax 25 mg twice daily.  She took this medication for 4 days but discontinued as medication was making her too fatigued.  She consider taking 1 tab at night only but wanted to speak with [place] in regards to this first.  She was evaluated in the ED on 12/19/2018 and returned on 12/23/2018 due to vaginal bleeding.  Apparently, all medications discontinued after 12/23/2018 visit and was started on Provera for 10-day course with recommending follow-up with OB/GYN for further evaluation.  She reports today that her headaches have improved and not  experience a headache since ED admission on 12/23/2018.  She does have an appointment scheduled at [facility] on 01/12/2019 for further evaluation.  She denies any continued vaginal bleeding at this time.  Blood pressure today satisfactory 130/68. It was recommended after prior visit to undergo transcranial Doppler with bubble study to assess for possible PFO.  This was obtained on 10/14/2018 and was negative for PFO.  Update 06/21/2019 : She returns for follow-up after last visit 5 months ago.  She is accompanied by a [place] language interpreter who was present throughout the visit and interprets for her.  Patient states her main complaint today is that her migraine headaches seem to have come back.  In the last 3 months she has had increased frequency of headaches which occur 3 to 4 days/week.  The headache starts off as a throbbing pain around her right temple as well as periorbital region becomes unbearable 10/10  in severity.  She has relied on.  Light and sound do worsen the headache.  She does not throw up or feel nauseous.  She does not have visual symptoms with the headaches.  Headache may last for most of the day.  She is unable to identify specific triggers for headaches but does feel tightness in the back of her head and neck muscles.  She has not tried triptans or prescription migraine specific medications she has tried taking Tylenol and ibuprofen without relief.  She has been taking naproxen which seems to work better.  She has been taking them 3 to 4 days a week.  She was previously on Topamax but when she started having dysfunctional uterine bleeding all the medications were stopped including Topamax.  She is unable to tell me if she had any bad side effects on it.  Patient recently changed her primary care physician and a new physician assistant discontinued protocol for unclear reason.  She remains on aspirin which is tolerating well without bruising or bleeding.  Her blood pressure is under  good control and today it is 142/83.  She has had no recurrent stroke or TIA symptoms.  Update 09/02/2019: Carla [name] is being seen today for 49-month follow-up regarding migraine headaches accompanied by interpreter.  After prior visit, Topamax 25 mg daily was restarted due to worsening migraines as well as Relpax for emergent relief.  She did not tolerate Topamax as she reports this caused weight loss and short-term memory loss.  She was unable to afford Relpax.  She has recently been experiencing migraines 2 times weekly and typically lasts for 1 hour total but during this time, migraines are debilitating and continues to be located right temporal area.  She has not trialed any other type of migraine medication.  Recent diagnosis of COVID-19 in 06/2019 and feels as though she has not completely recovered mentally and physically.  After prior visit, lipid panel obtained and recommended restart pravastatin but due to side effects, PCP initiated Crestor.  She reports difficulty tolerating Crestor but plans on following up with PCP for further monitoring and management.  Blood pressure today 135/91.  Update 11/04/2019: Carla [name] is a 53 year old female who is being seen today by patient request due to concerns of balance difficulties concern for stroke.  She is accompanied today by a interpreter.  She did not seek medical treatment at onset.  Approximately 1.5 weeks ago, she states she started experiencing intermittent balance impairment with a swaying type sensation and room spinning.  Symptoms severe where she will have to hold onto something and close her eyes.  She states she experienced same type of symptoms with prior stroke.  She denies associated headache, neck pain, speech difficulty, visual changes, weakness or numbness/tingling.  She denies increased stress or anxiety/depression.  She states standing straight up or laying flat or with head movement induces the symptoms.  She denies currently feeling  symptoms at today's visit.  She does endorse recently stopping her metoprolol 3 days ago due to increased stress and irritability symptoms.  She does monitor blood pressure at home and SBP 130s.  She is unable to endorse diastolic pressures or heart rates.  Blood pressure today satisfactory at 132/96.  He does endorse continuation of aspirin 81 mg daily and recently started on pravastatin approximately 3 weeks ago by PCP.  Update 11/09/2019: Carla [name] is a 53 year old female who is being seen today by patient request due to recent ED admission on 11/06/2019 for ongoing  concerns of dizziness previously evaluated in office 5 days ago.  She is accompanied by interpreter.  Per review of ED note, she reported worsening dizziness and headache 2 days prior to ED admission concerning for stroke.  CT head and MRI brain no acute abnormalities.  She was advised to follow-up with PCP and neurology at discharge.  She has been doing well since discharge without any reoccurring vertigo or dizziness symptoms or headaches.  She has continued to take meclizine with benefit 25 mg tablets twice daily.  She continues on aspirin 81 mg daily and pravastatin for secondary stroke prevention without side effects. Blood pressure today 124/71.  No further concerns at this time.  Update 01/04/2020: Carla. [name] is a 53 year old female who is being seen today for follow-up regarding prior concerns of dizziness accompanied by interpreter.  Denies residual/dizziness symptoms.  Continues twice daily meclizine with occasional fatigue.  Continues on aspirin 81 mg daily and pravastatin for secondary stroke prevention without side effects.  Blood pressure today 127/78.  Mild short-term memory loss but endorses ongoing improvement.  No concerns at this time.    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   Hyperlipidemia    Hypertension    Stroke (HCC)    09/2015   Vertigo     Social History:  Social History   Socioeconomic History   Marital status: Married    Spouse name: Not on file   Number of children: Not on file   Years of education: Not on file   Highest education level: Not on file  Occupational History   Occupation: Financial trader  Tobacco Use   Smoking status: Never   Smokeless tobacco: Never  Vaping Use   Vaping status: Never Used  Substance and Sexual Activity   Alcohol use: No   Drug use: No   Sexual activity: Not Currently    Birth control/protection: None  Other Topics Concern   Not on file  Social History Narrative   From [place]   Lived in [place] for 20 years, and been in [place] for 6 years   Husband and son, 9   Works: clean homes      Pt lives with husband    Pt works    Social Drivers of Corporate investment banker Strain: Not on Ship broker Insecurity: Not on file  Transportation Needs: Not on file  Physical Activity: Not on file  Stress: Not on file  Social Connections: Not on file  Intimate Partner Violence: Not on file    Medications:   Current Outpatient Medications on File Prior to Visit  Medication Sig Dispense Refill   aspirin EC 81 MG tablet Take 81 mg by mouth daily. Swallow whole.     desonide (DESOWEN) 0.05 % cream Apply topically 2 (two) times daily. 30 g 0   metoprolol tartrate (LOPRESSOR) 25 MG tablet TAKE 1/2 TABLET POR VIA ORAL DOS VECES AL DIA (Patient taking differently: Take 12.5 mg by mouth 2 (two) times daily.) 90 tablet 1   ondansetron (ZOFRAN-ODT) 4 MG disintegrating tablet Take 1 tablet (4 mg total) by mouth every 6 (six) hours as needed for nausea or vomiting. (Patient not taking: Reported on 12/16/2023) 20 tablet 0   No current facility-administered medications on  file prior to visit.    Allergies:   Allergies  Allergen Reactions   Gabapentin Other (See Comments)    Panic  attacks   Pravastatin Anxiety   Tramadol Nausea Only    Physical Exam  Today's Vitals   12/16/23 1627  BP: 131/83  Pulse: 90  Weight: 140 lb (63.5 kg)  Height: 5\' 2"  (1.575 m)    Body mass index is 25.61 kg/m.  Physical exam General: well developed, well nourished, pleasant middle-aged Hispanic female, seated, in no apparent distress  Neurologic Exam Mental Status: awake and fully alert. Primarily Spanish-speaking with limited English. Denies speech and language concerns.  Oriented to place and time. Recent memory subjectively impaired and remote memory intact. Attention span, concentration and fund of knowledge appropriate. Mood and affect appropriate Cranial Nerves: Pupils equal, briskly reactive to light.  Extraocular movements full without nystagmus. Visual fields full to confrontation. Hearing intact. Facial sensation intact. Face, tongue, palate moves normally and symmetrically.   Motor: Normal bulk and tone. Normal strength in all tested extremity muscles. Sensory.: intact to touch , pinprick , position and vibratory sensation.  Coordination: Rapid alternating movements normal in all extremities. Finger-to-nose and heel-to-shin performed accurately bilaterally. Gait and Station: Arises from chair without difficulty. Stance is normal. Gait demonstrates normal stride length and balance without use of AD Reflexes: 1+ and symmetric. Toes downgoing.        ASSESSMENT/PLAN:  Carla [name] is a 53 year old Hispanic lady with with PMH of cryptogenic stroke 2016, TIA, HDL, HTN, BPPV, anxiety and migraines.  History of chronic migraines with worsening 02/2020 with migraine accompanied by transient left arm numbness and confusion with MRI unremarkable for acute abnormalities and diagnosed with complicated migraine.  She also has chronic vertigo.     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  -Continue to follow with PCP for HTN and HLD management and secondary stroke risk factor management with BP goal<130/90 and LDL goal<70     Follow-up in 6 months or call earlier if needed   CC:  Jarold Motto, [place]    I spent 30 minutes of face-to-face and non-face-to-face time with patient assisted by interpreter.  This included previsit chart review, lab review, study review, order entry, electronic health record documentation, patient education and discussion regarding above diagnoses and treatment plan and answered all other questions to patient's satisfaction  Ihor Austin, [facility]  [facility] [address] [place], [place] [address]  Phone [phone] Fax [phone] Note: This document was prepared with digital dictation and possible smart phrase technology. Any transcriptional errors that result from this process are unintentional.

## 2023-12-16 NOTE — Patient Instructions (Addendum)
Your Plan:  Continue amitriptyline 25 mg nightly  Referral will be placed to physical therapy for vestibular therapy - you will be called to schedule  Continue meclizine as needed for vertigo  Continue stroke prevention medications and continue close PCP follow-up for aggressive stroke risk factor management  Please follow up with your PCP regarding allergy concerns     Follow up in 6 months or call earlier if needed     Thank you for coming to see [place] at [facility]. I hope we have been able to provide you high quality care today.  You may receive a patient satisfaction survey over the next few weeks. We would appreciate your feedback and comments so that we may continue to improve ourselves and the health of our patients.

## 2023-12-26 ENCOUNTER — Other Ambulatory Visit: Payer: Self-pay

## 2023-12-26 ENCOUNTER — Emergency Department (HOSPITAL_COMMUNITY): Payer: Medicaid Other

## 2023-12-26 ENCOUNTER — Encounter (HOSPITAL_COMMUNITY): Payer: Self-pay | Admitting: Emergency Medicine

## 2023-12-26 ENCOUNTER — Emergency Department (HOSPITAL_COMMUNITY)
Admission: EM | Admit: 2023-12-26 | Discharge: 2023-12-26 | Disposition: A | Discharge status: Home / Self Care | Payer: Medicaid Other | Attending: Emergency Medicine | Admitting: Emergency Medicine

## 2023-12-26 DIAGNOSIS — D1803 Hemangioma of intra-abdominal structures: Secondary | ICD-10-CM | POA: Insufficient documentation

## 2023-12-26 DIAGNOSIS — R112 Nausea with vomiting, unspecified: Secondary | ICD-10-CM

## 2023-12-26 DIAGNOSIS — R1011 Right upper quadrant pain: Secondary | ICD-10-CM | POA: Diagnosis present

## 2023-12-26 LAB — COMPREHENSIVE METABOLIC PANEL
ALT: 36 U/L (ref 0–44)
AST: 28 U/L (ref 15–41)
Albumin: 3.8 g/dL (ref 3.5–5.0)
Alkaline Phosphatase: 89 U/L (ref 38–126)
Anion gap: 10 (ref 5–15)
BUN: 7 mg/dL (ref 6–20)
CO2: 27 mmol/L (ref 22–32)
Calcium: 9.5 mg/dL (ref 8.9–10.3)
Chloride: 104 mmol/L (ref 98–111)
Creatinine, Ser: 0.84 mg/dL (ref 0.44–1.00)
GFR, Estimated: >60 mL/min (ref >60)
Glucose, Bld: 93 mg/dL (ref 70–99)
Potassium: 3.9 mmol/L (ref 3.5–5.1)
Sodium: 141 mmol/L (ref 135–145)
Total Bilirubin: 0.7 mg/dL (ref 0.0–1.2)
Total Protein: 7.3 g/dL (ref 6.5–8.1)

## 2023-12-26 LAB — CBC
HCT: 42.4 % (ref 36.0–46.0)
Hemoglobin: 14 g/dL (ref 12.0–15.0)
MCH: 29.8 pg (ref 26.0–34.0)
MCHC: 33 g/dL (ref 30.0–36.0)
MCV: 90.2 fL (ref 80.0–100.0)
Platelets: 415 10*3/uL — ABNORMAL HIGH (ref 150–400)
RBC: 4.7 MIL/uL (ref 3.87–5.11)
RDW: 12.4 % (ref 11.5–15.5)
WBC: 7.5 10*3/uL (ref 4.0–10.5)
nRBC: 0 % (ref 0.0–0.2)

## 2023-12-26 LAB — URINALYSIS, ROUTINE W REFLEX MICROSCOPIC
Bacteria, UA: NONE SEEN
Bilirubin Urine: NEGATIVE
Glucose, UA: NEGATIVE mg/dL
Ketones, ur: NEGATIVE mg/dL
Leukocytes,Ua: NEGATIVE
Nitrite: NEGATIVE
Protein, ur: NEGATIVE mg/dL
Specific Gravity, Urine: 1.003 — ABNORMAL LOW (ref 1.005–1.030)
pH: 7 (ref 5.0–8.0)

## 2023-12-26 LAB — CBG MONITORING, ED: Glucose-Capillary: 97 mg/dL (ref 70–99)

## 2023-12-26 LAB — LIPASE, BLOOD: Lipase: 41 U/L (ref 11–51)

## 2023-12-26 MED ORDER — SODIUM CHLORIDE 0.9 % IV BOLUS
1000.0000 mL | Freq: Once | INTRAVENOUS | Status: AC
Start: 2023-12-26 — End: 2023-12-26
  Administered 2023-12-26: 1000 mL via INTRAVENOUS

## 2023-12-26 MED ORDER — IOHEXOL 350 MG/ML SOLN
75.0000 mL | Freq: Once | INTRAVENOUS | Status: AC | PRN
  Administered 2023-12-26: 75 mL via INTRAVENOUS

## 2023-12-26 MED ORDER — ONDANSETRON HCL 4 MG/2ML IJ SOLN
4.0000 mg | Freq: Once | INTRAMUSCULAR | Status: AC
  Administered 2023-12-26: 4 mg via INTRAVENOUS
  Filled 2023-12-26: qty 2

## 2023-12-26 MED ORDER — ONDANSETRON 4 MG PO TBDP: ORAL_TABLET | ORAL | 0 refills | Status: DC

## 2023-12-26 NOTE — ED Notes (Signed)
Fluids and snack given tolerated well.

## 2023-12-26 NOTE — ED Provider Notes (Signed)
[place] EMERGENCY DEPARTMENT AT [facility] Provider Note   CSN: [id number] Arrival date & time: 12/26/23  1328     History  Chief Complaint  Patient presents with   Abdominal Pain   Urinary Frequency    Carla [name] is a 53 y.o. female history of hypertension, hyperlipidemia, cryptogenic stroke, cavernous hemangioma of the liver here presenting with right upper quadrant pain and vomiting.  Patient had similar pain previously.  She has a known hemangioma.  However her pain got worse and she started vomiting.  Denies any fevers.  Denies any sick contacts.  She also has urinary frequency as well   The history is provided by the patient.       Home Medications Prior to Admission medications   Medication Sig Start Date End Date Taking? Authorizing Provider  amitriptyline (ELAVIL) 25 MG tablet Take 1 tablet (25 mg total) by mouth at bedtime. 12/16/23   Ihor Austin, NP  aspirin EC 81 MG tablet Take 81 mg by mouth daily. Swallow whole.    [provider]  desonide (DESOWEN) 0.05 % cream Apply topically 2 (two) times daily. 12/02/23   Jarold Motto, PA  meclizine (ANTIVERT) 25 MG tablet Take 1 tablet (25 mg total) by mouth 2 (two) times daily as needed for dizziness. 12/16/23   Ihor Austin, NP  metoprolol tartrate (LOPRESSOR) 25 MG tablet TAKE 1/2 TABLET POR VIA ORAL DOS VECES AL DIA Patient taking differently: Take 12.5 mg by mouth 2 (two) times daily. 05/12/23   Jarold Motto, PA  ondansetron (ZOFRAN-ODT) 4 MG disintegrating tablet Take 1 tablet (4 mg total) by mouth every 6 (six) hours as needed for nausea or vomiting. Patient not taking: Reported on 12/16/2023 09/12/23   Rising, Lurena Joiner, PA-C      Allergies    Gabapentin, Pravastatin, and Tramadol    Review of Systems   Review of Systems  Gastrointestinal:  Positive for abdominal pain.  Genitourinary:  Positive for frequency.  All other systems reviewed and are negative.   Physical Exam Updated  Vital Signs BP (!) 145/92 (BP Location: Right Arm)   Pulse 70   Temp 98.5 F (36.9 C) (Oral)   Resp 20   Ht 5\' 2"  (1.575 m)   Wt 63 kg   LMP 01/16/2020   SpO2 100%   BMI 25.40 kg/m  Physical Exam Vitals and nursing note reviewed.  Constitutional:      Comments: Slightly dehydrated  HENT:     Head: Normocephalic.  Eyes:     Extraocular Movements: Extraocular movements intact.  Cardiovascular:     Rate and Rhythm: Normal rate and regular rhythm.     Heart sounds: Normal heart sounds.  Pulmonary:     Effort: Pulmonary effort is normal.     Breath sounds: Normal breath sounds.  Abdominal:     General: Abdomen is flat.     Comments: Mild right upper quadrant tenderness  Skin:    General: Skin is warm.  Neurological:     General: No focal deficit present.  Psychiatric:        Mood and Affect: Mood normal.     ED Results / Procedures / Treatments   Labs (all labs ordered are listed, but only abnormal results are displayed) Labs Reviewed  CBC - Abnormal; Notable for the following components:      Result Value   Platelets 415 (*)    All other components within normal limits  URINALYSIS, ROUTINE W REFLEX MICROSCOPIC -  Abnormal; Notable for the following components:   Color, Urine STRAW (*)    Specific Gravity, Urine 1.003 (*)    Hgb urine dipstick SMALL (*)    All other components within normal limits  LIPASE, BLOOD  COMPREHENSIVE METABOLIC PANEL  CBG MONITORING, ED    EKG None  Radiology CT ABDOMEN PELVIS W CONTRAST Result Date: 12/26/2023 CLINICAL DATA:  Right lower quadrant abdominal pain radiating to the right flank for 2 days. Increasing urination, thirst, and blurred vision for 3 days. EXAM: CT ABDOMEN AND PELVIS WITH CONTRAST TECHNIQUE: Multidetector CT imaging of the abdomen and pelvis was performed using the standard protocol following bolus administration of intravenous contrast. RADIATION DOSE REDUCTION: This exam was performed according to the  departmental dose-optimization program which includes automated exposure control, adjustment of the mA and/or kV according to patient size and/or use of iterative reconstruction technique. CONTRAST:  75mL OMNIPAQUE IOHEXOL 350 MG/ML SOLN COMPARISON:  09/29/2023 FINDINGS: Lower chest: Lung bases are clear. Hepatobiliary: Focal lesions in the right lobe of the liver measuring 9.9 cm diameter and 5.8 cm diameter. Enhancement pattern is consistent with giant cavernous hemangiomas. No change since prior study. No imaging follow-up is indicated. Gallbladder and bile ducts are normal. Pancreas: Unremarkable. No pancreatic ductal dilatation or surrounding inflammatory changes. Spleen: Normal in size without focal abnormality. Adrenals/Urinary Tract: Adrenal glands are unremarkable. Kidneys are normal, without renal calculi, focal lesion, or hydronephrosis. Bladder is unremarkable. Stomach/Bowel: Stomach, small bowel, and colon are not abnormally distended. No wall thickening or inflammatory changes. Appendix is normal. Vascular/Lymphatic: No significant vascular findings are present. No enlarged abdominal or pelvic lymph nodes. Reproductive: Uterus and bilateral adnexa are unremarkable. Other: Free air or free fluid in the abdomen. Surgical clip in the right lower quadrant. Abdominal wall musculature appears intact. Musculoskeletal: No acute or significant osseous findings. IMPRESSION: 1. No evidence of bowel obstruction or inflammation. Appendix is normal. 2. No renal or ureteral stone or obstruction. 3. Giant cavernous hemangiomas in the liver are unchanged since prior study. Electronically Signed   By: Burman Nieves M.D.   On: 12/26/2023 19:13    Procedures Procedures    Medications Ordered in ED Medications  iohexol (OMNIPAQUE) 350 MG/ML injection 75 mL (75 mLs Intravenous Contrast Given 12/26/23 1831)  sodium chloride 0.9 % bolus 1,000 mL (1,000 mLs Intravenous New Bag/Given 12/26/23 2038)  ondansetron  (ZOFRAN) injection 4 mg (4 mg Intravenous Given 12/26/23 2037)    ED Course/ Medical Decision Making/ A&P                                 Medical Decision Making Carla [name] is a 53 y.o. female here presenting with right upper quadrant pain and vomiting.  Patient has history of liver hemangioma.  Plan to get CBC and CMP and CT abdomen pelvis and UA  9:19 PM I reviewed patient's labs and urinalysis and they were unremarkable.  CT abdomen pelvis showed stable hemangiomas.  Patient is stable for discharge.   Problems Addressed: Liver hemangioma: chronic illness or injury Nausea and vomiting, unspecified vomiting type: acute illness or injury  Amount and/or Complexity of Data Reviewed Labs: ordered. Decision-making details documented in ED Course.  Risk Prescription drug management.    Final Clinical Impression(s) / ED Diagnoses Final diagnoses:  None    Rx / DC Orders ED Discharge Orders     None         Silverio Lay,  Gonzella Lex, MD 12/26/23 2120

## 2023-12-26 NOTE — ED Notes (Signed)
Discharge paperwork reviewed, patient verbalized understanding, no questions asked. IV removed.

## 2023-12-26 NOTE — ED Provider Triage Note (Signed)
Emergency Medicine Provider Triage Evaluation Note  Tameca Jerez , a 53 y.o. female  was evaluated in triage.  Pt complains of abdominal pain.  Review of Systems  Positive:  Negative:   Physical Exam  BP 139/88   Pulse 90   Temp 97.9 F (36.6 C) (Oral)   Resp 16   Ht 5\' 2"  (1.575 m)   Wt 63 kg   LMP 01/16/2020   SpO2 100%   BMI 25.40 kg/m  Gen:   Awake, no distress   Resp:  Normal effort  MSK:   Moves extremities without difficulty  Other:    Medical Decision Making  Medically screening exam initiated at 3:30 PM.  Appropriate orders placed.  Laketia Vicknair was informed that the remainder of the evaluation will be completed by another provider, this initial triage assessment does not replace that evaluation, and the importance of remaining in the ED until their evaluation is complete.  Fatigue, RLQ pain, increased thirst, increased urination. Pain radiates to the posterior right flank. Symptoms have been going on for a least 2 days.    Dorthy Cooler, [place] 12/26/23 [phone]

## 2023-12-26 NOTE — Discharge Instructions (Signed)
As we discussed, your liver hemangioma is unchanged  Your labs are normal today  Please stay hydrated and take Zofran as needed  See your doctor for follow-up  Return to ER if you have worse abdominal pain or vomiting

## 2023-12-26 NOTE — ED Triage Notes (Signed)
Pt complains of RLQ pain radiates to right flank x 2 days. PT also complains of increased urination, increased thirst and blurred vision x 3 days. Hx of kidney stones. No history of diabetes.

## 2023-12-30 ENCOUNTER — Other Ambulatory Visit: Payer: Self-pay

## 2023-12-30 ENCOUNTER — Encounter: Payer: Self-pay | Admitting: Gastroenterology

## 2023-12-30 ENCOUNTER — Ambulatory Visit (INDEPENDENT_AMBULATORY_CARE_PROVIDER_SITE_OTHER): Payer: Medicaid Other | Admitting: Gastroenterology

## 2023-12-30 VITALS — BP 112/80 | HR 93 | Ht 62.0 in | Wt 136.0 lb

## 2023-12-30 DIAGNOSIS — D1803 Hemangioma of intra-abdominal structures: Secondary | ICD-10-CM | POA: Diagnosis not present

## 2023-12-30 DIAGNOSIS — K625 Hemorrhage of anus and rectum: Secondary | ICD-10-CM

## 2023-12-30 DIAGNOSIS — R1011 Right upper quadrant pain: Secondary | ICD-10-CM | POA: Diagnosis not present

## 2023-12-30 MED ORDER — SUTAB 1479-225-188 MG PO TABS: ORAL_TABLET | ORAL | Status: DC

## 2023-12-30 MED ORDER — OMEPRAZOLE 20 MG PO CPDR: 20.0000 mg | DELAYED_RELEASE_CAPSULE | Freq: Every day | ORAL | 3 refills | Status: DC

## 2023-12-30 NOTE — Progress Notes (Signed)
 HPI :  53 year old female with a history of giant hepatic hemangioma, hypertension, remote CVA, referred here for symptoms of abdominal pain by Lucie Buttner, PA.  Patient is Spanish-speaking and history obtained through interpreter service.  This is my first time meeting her.  Main complaint has been worsening upper abdominal pain.  She localizes this to the right upper quadrant.  She states this has been ongoing for years.  History took several minutes to obtain but from what I can understand she at baseline will experience some mild discomfort typically 20 days/month.  The pain can last anywhere from minutes to upwards of an hour and a half.  She describes it as a pulsing sensation.  She can often feel it multiple times per day at baseline.  This been ongoing for few years.  In recent week or 2 it has been occurring more frequently and more intense than usual.  This led to an ED visit on 31 January where she had a CT scan done.  Full discussion of this as below.  She denies really any clear triggers to her pain.  Occasionally it could be related to eating but she often has pains that are related to eating at all.  She is not sure if some of her blood pressure medications can cause her to feel poorly.  She stopped using a particular cooking oil and switch to olive oil and states perhaps that made it less frequent in recent months.  She has several other symptoms she is describing such as dizziness and blurred vision at times over weeks, has questions about her HTN regimen, but these are unrelated to her abdominal symptoms.  She had questions about several symptoms today.  She states a few months ago she was constipated and took some laxatives which appeared to help her.  More recently and changing her cooking oil her bowels have been better.  She states she has never really had blood in her stools to about 2 weeks ago where she had a significant mount of blood noted.  This was the first time its  happened.  Her brother had colon cancer diagnosed at age 45.  She has never had a colonoscopy.  She is never had an EGD.  She denies any cardiopulmonary symptoms here today.  She generally eats well without any nausea or vomiting.  She has some belching that bothers her at baseline, no significant reflux that she endorses.  On review of her imaging studies that I can see in the system, she has had 4 CT scans in the past year as well as an ultrasound.  Total of 9 imaging studies of her liver dating back to 2016.  All of these appear to show a stable large hemangioma upwards of 9 to 10 cm and another smaller lesion around 5 cm.  I do not see any interval growth of these lesions over the past 9 years or so.     Echo 09/2015 - EF 55-60%,    CT abdomen pelvis 12/26/23: Hepatobiliary: Focal lesions in the right lobe of the liver measuring 9.9 cm diameter and 5.8 cm diameter. Enhancement pattern is consistent with giant cavernous hemangiomas. No change since prior study. No imaging follow-up is indicated. Gallbladder and bile ducts are normal. IMPRESSION: 1. No evidence of bowel obstruction or inflammation. Appendix is normal. 2. No renal or ureteral stone or obstruction. 3. Giant cavernous hemangiomas in the liver are unchanged since prior study.   CT abdomen / pelvis 09/29/23: IMPRESSION:  1. No acute intra-abdominal pathology identified. 2. Stable benign cavernous hemangiomas within segments 5, 6, and 7 of the liver. In the symptomatic patient, interventional radiology consultation may be helpful for further consideration of endovascular therapy.   RUQ US  08/2023: FINDINGS: Gallbladder:   Gallstones: None   Sludge: None   Gallbladder Wall: Within normal limits   Pericholecystic fluid: None   Sonographic Murphy's Sign: Negative per technologist   Common bile duct:   Diameter: 3 mm   Liver:   Parenchymal echogenicity: Within normal limits   Contours: Normal   Lesions:  Right hepatic lobe hemangiomas are again seen measuring 7.9 x 5.9 x 5.4 cm and 5.8 x 4.6 x 4.8 cm.   Portal vein: Patent.  Hepatopetal flow   Other: None.   IMPRESSION: No acute abnormality of the liver or gallbladder. No gallstones identified.    CT abdomen / pelvis 04/04/23: IMPRESSION: 1. No acute intra-abdominal process. 2. Stable hepatic hemangiomas.   CT abdomen / pelvis 12/30/22: IMPRESSION: 1. No acute findings. No renal or ureteral stones. No findings to account for flank pain. 2. Stable large liver hemangiomas.   MRI liver 09/2015: IMPRESSION: Two benign cavernous hemangiomas in the right hepatic lobe, measuring 9.9 and 6.3 cm, as described above.   No suspicious hepatic lesions.  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   Hyperlipidemia    Hypertension    Stroke (HCC)    09/2015   Vertigo      Past Surgical History:  Procedure Laterality Date   RIGHT ARM SURGERY     TEE WITHOUT CARDIOVERSION N/A 10/13/2015   Procedure: TRANSESOPHAGEAL ECHOCARDIOGRAM (TEE);  Surgeon: Redell GORMAN Shallow, MD;  Location[facility];  Service: Cardiovascular;  Laterality: N/A;   Family History  Problem Relation Age of Onset   Leukemia Mother    Colon cancer Brother    Anxiety disorder Brother    Asthma Neg Hx    Cancer Neg Hx    Diabetes Neg Hx    Hyperlipidemia Neg Hx    Heart failure Neg Hx    Hypertension Neg Hx    Migraines Neg Hx    Rashes / Skin problems Neg Hx    Seizures Neg Hx    Stroke Neg Hx    Thyroid  disease Neg Hx    Social History   Tobacco Use   Smoking status: Never   Smokeless tobacco: Never  Vaping Use   Vaping status: Never Used  Substance Use Topics   Alcohol use: No   Drug use: No   Current Outpatient Medications  Medication Sig Dispense Refill   metoprolol  tartrate  (LOPRESSOR ) 25 MG tablet TAKE 1/2 TABLET POR VIA ORAL DOS VECES AL DIA (Patient taking differently: Take 12.5 mg by mouth 2 (two) times daily.) 90 tablet 1   amitriptyline  (ELAVIL ) 25 MG tablet Take 1 tablet (25 mg total) by mouth at bedtime. (Patient not taking: Reported on 12/30/2023) 90 tablet 3   aspirin  EC 81 MG tablet Take 81 mg by mouth daily. Swallow whole. (Patient not taking: Reported on 12/30/2023)     desonide  (DESOWEN ) 0.05 % cream Apply topically 2 (two) times daily. (Patient not taking: Reported on 12/30/2023) 30 g 0   meclizine  (ANTIVERT ) 25 MG tablet Take 1 tablet (25 mg total) by mouth 2 (two) times daily as  needed for dizziness. (Patient not taking: Reported on 12/30/2023) 30 tablet 11   ondansetron  (ZOFRAN -ODT) 4 MG disintegrating tablet 4mg  ODT q4 hours prn nausea/vomit (Patient not taking: Reported on 12/30/2023) 10 tablet 0   No current facility-administered medications for this visit.   Allergies  Allergen Reactions   Gabapentin  Other (See Comments)    Panic attacks   Pravastatin  Anxiety   Tramadol  Nausea Only     Review of Systems: All systems reviewed and negative except where noted in HPI.    CT ABDOMEN PELVIS W CONTRAST Result Date: 12/26/2023 CLINICAL DATA:  Right lower quadrant abdominal pain radiating to the right flank for 2 days. Increasing urination, thirst, and blurred vision for 3 days. EXAM: CT ABDOMEN AND PELVIS WITH CONTRAST TECHNIQUE: Multidetector CT imaging of the abdomen and pelvis was performed using the standard protocol following bolus administration of intravenous contrast. RADIATION DOSE REDUCTION: This exam was performed according to the departmental dose-optimization program which includes automated exposure control, adjustment of the mA and/or kV according to patient size and/or use of iterative reconstruction technique. CONTRAST:  75mL OMNIPAQUE  IOHEXOL  350 MG/ML SOLN COMPARISON:  09/29/2023 FINDINGS: Lower chest: Lung bases are clear. Hepatobiliary:  Focal lesions in the right lobe of the liver measuring 9.9 cm diameter and 5.8 cm diameter. Enhancement pattern is consistent with giant cavernous hemangiomas. No change since prior study. No imaging follow-up is indicated. Gallbladder and bile ducts are normal. Pancreas: Unremarkable. No pancreatic ductal dilatation or surrounding inflammatory changes. Spleen: Normal in size without focal abnormality. Adrenals/Urinary Tract: Adrenal glands are unremarkable. Kidneys are normal, without renal calculi, focal lesion, or hydronephrosis. Bladder is unremarkable. Stomach/Bowel: Stomach, small bowel, and colon are not abnormally distended. No wall thickening or inflammatory changes. Appendix is normal. Vascular/Lymphatic: No significant vascular findings are present. No enlarged abdominal or pelvic lymph nodes. Reproductive: Uterus and bilateral adnexa are unremarkable. Other: Free air or free fluid in the abdomen. Surgical clip in the right lower quadrant. Abdominal wall musculature appears intact. Musculoskeletal: No acute or significant osseous findings. IMPRESSION: 1. No evidence of bowel obstruction or inflammation. Appendix is normal. 2. No renal or ureteral stone or obstruction. 3. Giant cavernous hemangiomas in the liver are unchanged since prior study. Electronically Signed   By: Elsie Gravely M.D.   On: 12/26/2023 19:13   Lab Results  Component Value Date   WBC 7.5 12/26/2023   HGB 14.0 12/26/2023   HCT 42.4 12/26/2023   MCV 90.2 12/26/2023   PLT 415 (H) 12/26/2023    Lab Results  Component Value Date   NA 141 12/26/2023   CL 104 12/26/2023   K 3.9 12/26/2023   CO2 27 12/26/2023   BUN 7 12/26/2023   CREATININE 0.84 12/26/2023   GFRNONAA >60 12/26/2023   CALCIUM  9.5 12/26/2023   ALBUMIN 3.8 12/26/2023   GLUCOSE 93 12/26/2023    Lab Results  Component Value Date   ALT 36 12/26/2023   AST 28 12/26/2023   ALKPHOS 89 12/26/2023   BILITOT 0.7 12/26/2023     Physical Exam: BP 112/80    Pulse 93   Ht 5' 2 (1.575 m)   Wt 136 lb (61.7 kg)   LMP 01/16/2020   BMI 24.87 kg/m  Constitutional: Pleasant,well-developed, female in no acute distress. HEENT: Normocephalic and atraumatic. Conjunctivae are normal. No scleral icterus. Neck supple.  Cardiovascular: Normal rate, regular rhythm.  Pulmonary/chest: Effort normal and breath sounds normal. No wheezing, rales or rhonchi. Abdominal: Soft, nondistended,, focally tender in RUQ,  negative [place].  There are no masses palpable. No hepatomegaly. Extremities: no edema Neurological: Alert and oriented to person place and time. Skin: Skin is warm and dry. No rashes noted. Psychiatric: Normal mood and affect. Behavior is normal.   ASSESSMENT: 53 y.o. female here for assessment of the following  1. Hepatic hemangioma   2. RUQ pain   3. Rectal bleeding    Patient presenting with a variety of symptoms today, use of translator very useful as it was difficult to get history of her pertinent problems for us  today.  My impression is that she is had stable giant hepatic hemangioma for years on imaging that has not changed.  However, she has had years worth of intermittent right upper quadrant pain described as a pulsatile sensation.  This has gotten worse recently.  She has had numerous imaging studies, most recently again last week which showed stability in the findings.  The concern is that this large hepatic hemangioma potentially could be causing symptoms.  I described what this is with her, the benign nature of it, however while these generally do not cause symptoms, when they are large they can.  That being said, I do think she needs additional workup to further evaluate this and ensure no other cause prior to pursuing treatment for the hemangioma given treatment for this would potentially be significant surgical resection or potentially IR evaluation for embolization.  Recommending EGD to clear her upper tract.  We discussed risks  and benefits of this and anesthesia and she wants to proceed.  In the interim while we are waiting to do that recommend starting omeprazole  20 mg daily to see if that makes any difference in her symptoms.  At the same time, recommend colonoscopy to evaluate her rectal bleeding and strong family history of colon cancer, she has never had an exam.  Following discussion of this she wishes to proceed.  She had a variety of other complaints she wanted to discuss today.  In regards to her dizziness, hypertension, her visual changes, I recommend she see her primary care and optometrist and she understands and will do so.  PLAN: - schedule EGD and colonoscopy at the [facility] - start trial of omeprazole  20mg  / daily for 30 days with 3 refills - if symptoms persist without clear etiology on her endoscopic workup, consider referral to hepatobiliary surgeon and IR for opinion on hepatic hemangioma - follow up with PCP and optometry  I spent 70 minutes of time, including in depth chart review, independent review of results as outlined above, face-to-face time with the patient, coordinating care, and documentation.    Marcey Naval, MD [facility]  CC: Job Lukes, [place]

## 2023-12-30 NOTE — Patient Instructions (Addendum)
 We have sent the following medications to your pharmacy for you to pick up at your convenience: Omeprazole  20mg  daily  You have been scheduled for an endoscopy and colonoscopy. Please follow the written instructions given to you at your visit today.  If you use inhalers (even only as needed), please bring them with you on the day of your procedure.  DO NOT TAKE 7 DAYS PRIOR TO TEST- Trulicity (dulaglutide) Ozempic, Wegovy (semaglutide) Mounjaro (tirzepatide) Bydureon Bcise (exanatide extended release)  DO NOT TAKE 1 DAY PRIOR TO YOUR TEST Rybelsus (semaglutide) Adlyxin (lixisenatide) Victoza (liraglutide) Byetta (exanatide) _____________________________________________________________  If your blood pressure at your visit was 140/90 or greater, please contact your primary care physician to follow up on this.  _______________________________________________________  If you are age 23 or older, your body mass index should be between 23-30. Your Body mass index is 24.87 kg/m. If this is out of the aforementioned range listed, please consider follow up with your Primary Care Provider.  If you are age 11 or younger, your body mass index should be between 19-25. Your Body mass index is 24.87 kg/m. If this is out of the aformentioned range listed, please consider follow up with your Primary Care Provider.   ________________________________________________________  The [facility] providers would like to encourage you to use MYCHART to communicate with providers for non-urgent requests or questions.  Due to long hold times on the telephone, sending your provider a message by [facility] may be a faster and more efficient way to get a response.  Please allow 48 business hours for a response.  Please remember that this is for non-urgent requests.  _______________________________________________________  Thank you for entrusting me with your care and for choosing [facility], Dr. Elspeth Naval

## 2024-01-02 ENCOUNTER — Telehealth: Payer: Self-pay

## 2024-01-02 MED ORDER — NA SULFATE-K SULFATE-MG SULF 17.5-3.13-1.6 GM/177ML PO SOLN: 1.0000 | Freq: Once | ORAL | 0 refills | Status: AC

## 2024-01-02 NOTE — Telephone Encounter (Signed)
 Pt called [facility] wanting to change her prep from the SUTAB  to SUPREP. Prep sent to pharmacy. Pt will be arriving to pick up new prep instructions from [facility].

## 2024-01-05 ENCOUNTER — Encounter: Payer: Self-pay | Admitting: Gastroenterology

## 2024-01-05 ENCOUNTER — Ambulatory Visit (AMBULATORY_SURGERY_CENTER): Payer: Medicaid Other | Admitting: Gastroenterology

## 2024-01-05 VITALS — BP 125/70 | HR 79 | Temp 98.1°F | Resp 12 | Ht 62.0 in | Wt 136.0 lb

## 2024-01-05 DIAGNOSIS — B9681 Helicobacter pylori [H. pylori] as the cause of diseases classified elsewhere: Secondary | ICD-10-CM | POA: Diagnosis not present

## 2024-01-05 DIAGNOSIS — K648 Other hemorrhoids: Secondary | ICD-10-CM

## 2024-01-05 DIAGNOSIS — K625 Hemorrhage of anus and rectum: Secondary | ICD-10-CM

## 2024-01-05 DIAGNOSIS — K449 Diaphragmatic hernia without obstruction or gangrene: Secondary | ICD-10-CM | POA: Diagnosis not present

## 2024-01-05 DIAGNOSIS — Z800 Family history of malignant neoplasm of digestive organs: Secondary | ICD-10-CM

## 2024-01-05 DIAGNOSIS — K2950 Unspecified chronic gastritis without bleeding: Secondary | ICD-10-CM

## 2024-01-05 DIAGNOSIS — R1011 Right upper quadrant pain: Secondary | ICD-10-CM

## 2024-01-05 MED ORDER — SODIUM CHLORIDE 0.9 % IV SOLN: 500.0000 mL | Freq: Once | INTRAVENOUS | Status: DC

## 2024-01-05 NOTE — Patient Instructions (Addendum)
 Resume previous diet.                           - Continue present medications.                           - Await pathology results with further                            recommendations. Repeat colonoscopy in 5 years for screening purposes.     USTED TUVO UN PROCEDIMIENTO ENDOSCPICO HOY EN EL  ENDOSCOPY CENTER:   Lea el informe del procedimiento que se le entreg para cualquier pregunta especfica sobre lo que se Dentist.  Si el informe del examen no responde a sus preguntas, por favor llame a su gastroenterlogo para aclararlo.  Si usted solicit que no se le den [employer] de lo que se Clinical cytogeneticist en su procedimiento al [employer] va a cuidar, entonces el informe del procedimiento se ha incluido en un sobre sellado para que usted lo revise despus cuando le sea ms conveniente.   LO QUE PUEDE ESPERAR: Algunas sensaciones de hinchazn en el abdomen.  Puede tener ms gases de lo normal.  El caminar puede ayudarle a eliminar el aire que se le puso en el tracto gastrointestinal durante el procedimiento y reducir la hinchazn.  Si le hicieron una endoscopia inferior (como una colonoscopia o una sigmoidoscopia flexible), podra notar manchas de sangre en las heces fecales o en el papel higinico.  Si se someti a una preparacin intestinal para su procedimiento, es posible que no tenga una evacuacin intestinal normal durante [employer].   Tenga en cuenta:  Es posible que note un poco de irritacin y congestin en la nariz o algn drenaje.  Esto es debido al oxgeno [employer] durante su procedimiento.  No hay que preocuparse y esto debe desaparecer ms o Regulatory affairs officer.   SNTOMAS PARA REPORTAR INMEDIATAMENTE:  Despus de una endoscopia inferior (colonoscopia o sigmoidoscopia flexible):  Cantidades excesivas de sangre en las heces fecales  Sensibilidad significativa o empeoramiento de los dolores abdominales   Hinchazn aguda del abdomen que antes no tena   Fiebre  de 100F o ms   Despus de la endoscopia superior (EGD)  Vmitos de Retail buyer o material como caf molido   Dolor en el pecho o dolor debajo de los omplatos que antes no tena   Dolor o dificultad persistente para tragar  Falta de aire que antes no tena   Fiebre de 100F o ms  Heces fecales negras y pegajosas   Para asuntos urgentes o de Associate Professor, puede comunicarse con un gastroenterlogo a cualquier hora llamando al [phone].  DIETA:  Recomendamos una comida pequea al principio, pero luego puede continuar con su dieta normal.  Tome muchos lquidos, pero debe evitar las bebidas alcohlicas durante 24 horas.    ACTIVIDAD:  Debe planear tomarse las cosas con calma por el resto del da y no debe CONDUCIR ni usar maquinaria pesada Patent examiner (debido a los medicamentos de sedacin utilizados durante el examen).     SEGUIMIENTO: Nuestro personal llamar al nmero que aparece en su historial al siguiente da hbil de su procedimiento para ver cmo se siente y para responder cualquier pregunta o inquietud que pueda tener con respecto a la informacin que se le dio despus del procedimiento. Si  no podemos contactarle, le dejaremos un mensaje.  Sin embargo, si se siente bien y no tiene English as a second language teacher, no es necesario que nos devuelva la llamada.  Asumiremos que ha regresado a sus actividades diarias normales sin incidentes. Si se le tomaron algunas biopsias, le contactaremos por telfono o por carta en las prximas 3 semanas.  Si no ha sabido [employer] biopsias en el transcurso de 3 semanas, por favor llmenos al [phone].   FIRMAS/CONFIDENCIALIDAD: Usted y/o el acompaante que le cuide han firmado documentos que se ingresarn en su historial mdico electrnico.  Estas firmas atestiguan el hecho de que la informacin anterior

## 2024-01-05 NOTE — Progress Notes (Signed)
 Pt's states no medical or surgical changes since previsit or office visit.    Daughter in law- Cornelius Dill- interpreter

## 2024-01-05 NOTE — Progress Notes (Signed)
 Called to room to assist during endoscopic procedure.  Patient ID and intended procedure confirmed with present staff. Received instructions for my participation in the procedure from the performing physician.

## 2024-01-05 NOTE — Op Note (Signed)
 [facility] Patient Name: Carla [name] Procedure Date: 01/05/2024 7:30 AM MRN: [id number] Endoscopist: Landon Pinion P. General Kenner , MD, [id number] Age: 54 Referring MD:  Date of Birth: 04/09/1971 Gender: Female Account #: [id number] Procedure:                Colonoscopy Indications:              Rectal bleeding, strong family history of colon                            cancer (brother dx age 61s) - first time exam Medicines:                Monitored Anesthesia Care Procedure:                Pre-Anesthesia Assessment:                           - Prior to the procedure, a History and Physical                            was performed, and patient medications and                            allergies were reviewed. The patient's tolerance of                            previous anesthesia was also reviewed. The risks                            and benefits of the procedure and the sedation                            options and risks were discussed with the patient.                            All questions were answered, and informed consent                            was obtained. Prior Anticoagulants: The patient has                            taken no anticoagulant or antiplatelet agents. ASA                            Grade Assessment: III - A patient with severe                            systemic disease. After reviewing the risks and                            benefits, the patient was deemed in satisfactory                            condition to undergo the procedure.  After obtaining informed consent, the colonoscope                            was passed under direct vision. Throughout the                            procedure, the patient's blood pressure, pulse, and                            oxygen saturations were monitored continuously. The                            [id number]-[id number] Colonoscope [id number] was introduced                            through  the anus and advanced to the the terminal                            ileum, with identification of the appendiceal                            orifice and IC valve. The colonoscopy was performed                            without difficulty. The patient tolerated the                            procedure well. The quality of the bowel                            preparation was adequate. The terminal ileum,                            ileocecal valve, appendiceal orifice, and rectum                            were photographed. Scope In: 8:02:18 AM Scope Out: 8:19:02 AM Scope Withdrawal Time: 0 hours 12 minutes 48 seconds  Total Procedure Duration: 0 hours 16 minutes 44 seconds  Findings:                 The perianal and digital rectal examinations were                            normal.                           The terminal ileum appeared normal.                           A large amount of liquid stool was found in the                            entire colon, making visualization difficult  initially. Extensive lavage of the colon was                            performed using copious amounts of sterile water,                            resulting in clearance with adequate visualization.                           Internal hemorrhoids were found during                            retroflexion. The hemorrhoids were small.                           The exam was otherwise without abnormality. Complications:            No immediate complications. Estimated blood loss:                            None. Estimated Blood Loss:     Estimated blood loss: none. Impression:               - The examined portion of the ileum was normal.                           - Internal hemorrhoids.                           - The examination was otherwise normal.                           - No polyps.                           Suspect mild rectal bleeding from small                             hemorrhoids. No concerning pathology on this exam. Recommendation:           - Patient has a contact number available for                            emergencies. The signs and symptoms of potential                            delayed complications were discussed with the                            patient. Return to normal activities tomorrow.                            Written discharge instructions were provided to the                            patient.                           -  Resume previous diet.                           - Continue present medications.                           - Repeat colonoscopy in 5 years for screening                            purposes. Landon Pinion P. General Kenner, MD 01/05/2024 8:23:36 AM This report has been signed electronically.

## 2024-01-05 NOTE — Progress Notes (Signed)
 History and Physical Interval Note: Patient seen in the office on 12/30/23 - here for EGD and colonoscopy this AM. History of intermittent upper abdominal pain. 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 Also brother had CRC dx age 54 and she has had recent rectal bleeding. First colonoscopy. EGD and colonoscopy to further evaluate. No interval changes since the office visit.  She otherwise feels well today without complaints.    01/05/2024 7:28 AM  Carla [name]  has presented today for endoscopic procedure(s), with the diagnosis of  Encounter Diagnoses  Name Primary?   RUQ pain Yes   Rectal bleeding    Family history of colon cancer   .  The various methods of evaluation and treatment have been discussed with the patient and/or family. After consideration of risks, benefits and other options for treatment, the patient has consented to  the endoscopic procedure(s).   The patient's history has been reviewed, patient examined, no change in status, stable for surgery.  I have reviewed the patient's chart and labs.  Questions were answered to the patient's satisfaction.    Christi Coward, MD [facility]

## 2024-01-05 NOTE — Progress Notes (Signed)
 Pt A/O x 3, gd SR's, pleased with anesthesia, report to RN

## 2024-01-05 NOTE — Op Note (Signed)
 [facility] Patient Name: Carla [name] Procedure Date: 01/05/2024 7:30 AM MRN: [id number] Endoscopist: Landon Pinion P. General Kenner , MD, [id number] Age: 53 Referring MD:  Date of Birth: 1971-02-15 Gender: Female Account #: [id number] Procedure:                Upper GI endoscopy Indications:              Abdominal pain in the right upper quadrant, history                            of large hepatic hemangiomas - EGD to clear upper                            tract for cause of her pain Medicines:                Monitored Anesthesia Care Procedure:                Pre-Anesthesia Assessment:                           - Prior to the procedure, a History and Physical                            was performed, and patient medications and                            allergies were reviewed. The patient's tolerance of                            previous anesthesia was also reviewed. The risks                            and benefits of the procedure and the sedation                            options and risks were discussed with the patient.                            All questions were answered, and informed consent                            was obtained. Prior Anticoagulants: The patient has                            taken no anticoagulant or antiplatelet agents. ASA                            Grade Assessment: III - A patient with severe                            systemic disease. After reviewing the risks and                            benefits, the patient was deemed in satisfactory  condition to undergo the procedure.                           After obtaining informed consent, the endoscope was                            passed under direct vision. Throughout the                            procedure, the patient's blood pressure, pulse, and                            oxygen saturations were monitored continuously. The                            GIF [id number]  #[id number] was introduced through the                            mouth, and advanced to the second part of duodenum.                            The upper GI endoscopy was accomplished without                            difficulty. The patient tolerated the procedure                            well. Scope In: Scope Out: Findings:                 Esophagogastric landmarks were identified: the                            Z-line was found at 37 cm, the gastroesophageal                            junction was found at 37 cm and the upper extent of                            the gastric folds was found at 38 cm from the                            incisors.                           A 1 cm hiatal hernia was present.                           The exam of the esophagus was otherwise normal.                           Patchy mildly erythematous mucosa was found in the                            gastric body.  The exam of the stomach was otherwise normal.                           Biopsies were taken with a cold forceps for                            Helicobacter pylori testing.                           The examined duodenum was normal. Complications:            No immediate complications. Estimated blood loss:                            Minimal. Estimated Blood Loss:     Estimated blood loss was minimal. Impression:               - Esophagogastric landmarks identified.                           - 1 cm hiatal hernia.                           - Normal esophagus otherwise.                           - Erythematous mucosa in the gastric body.                           - Normal stomach otherwise.                           - Biopsies taken to rule out H pylori.                           - Normal examined duodenum.                           Will await biopsy results. If positive for H pylori                            will treat and see if that resolves her symptoms.                             Continue course of empiric PPI for now. If biopsies                            negative and symptoms persist despite trial of                            omeprazole , then consideration for hepatobiliary                            surgical referral in light of large hepatic  hemangioma. Recommendation:           - Patient has a contact number available for                            emergencies. The signs and symptoms of potential                            delayed complications were discussed with the                            patient. Return to normal activities tomorrow.                            Written discharge instructions were provided to the                            patient.                           - Resume previous diet.                           - Continue present medications.                           - Await pathology results with further                            recommendations. Landon Pinion P. General Kenner, MD 01/05/2024 8:26:35 AM This report has been signed electronically.

## 2024-01-06 ENCOUNTER — Telehealth: Payer: Self-pay

## 2024-01-06 NOTE — Telephone Encounter (Signed)
No answer, unable to leave a message, B.Kytzia Gienger RN.

## 2024-01-07 ENCOUNTER — Other Ambulatory Visit: Payer: Self-pay | Admitting: Physician Assistant

## 2024-01-07 LAB — SURGICAL PATHOLOGY

## 2024-01-14 ENCOUNTER — Other Ambulatory Visit: Payer: Self-pay

## 2024-01-14 MED ORDER — CLARITHROMYCIN 500 MG PO TABS: 500.0000 mg | ORAL_TABLET | Freq: Two times a day (BID) | ORAL | 0 refills | Status: AC

## 2024-01-14 MED ORDER — AMOXICILLIN 500 MG PO TABS: 1000.0000 mg | ORAL_TABLET | Freq: Two times a day (BID) | ORAL | 0 refills | Status: AC

## 2024-01-14 MED ORDER — METRONIDAZOLE 500 MG PO TABS: 500.0000 mg | ORAL_TABLET | Freq: Two times a day (BID) | ORAL | 0 refills | Status: AC

## 2024-02-25 ENCOUNTER — Telehealth: Payer: Self-pay | Admitting: *Deleted

## 2024-02-25 DIAGNOSIS — A048 Other specified bacterial intestinal infections: Secondary | ICD-10-CM

## 2024-02-25 NOTE — Telephone Encounter (Signed)
 Spoke to patient and reminded her to pick up H Pylori stool testing kit from 3rd floor front desk so we can make sure we have eradicated that bacteria. She verbalizes understanding.

## 2024-02-25 NOTE — Telephone Encounter (Signed)
-----   Message from Nurse [place] B sent at 02/25/2024 10:25 AM EDT ----- Regarding: FW: H pylori diatherix stool swab Dr. Adela Lank patient ----- Message ----- From: Marisa Sprinkles, RN Sent: 02/25/2024  12:00 AM EDT To: Marisa Sprinkles, RN Subject: H pylori diatherix stool swab                  H pylori diatherix stool swab - need to order

## 2024-02-27 NOTE — Telephone Encounter (Signed)
 Carla [name] came by and picked up her kit. We went over the instructions how to do it with the interpreter on a stick. She verbalized understanding.

## 2024-03-04 ENCOUNTER — Telehealth: Payer: Self-pay | Admitting: Gastroenterology

## 2024-03-04 NOTE — Telephone Encounter (Signed)
 Jan can you please let this patient know that her H pylori stool test is NEGATIVE. The antibiotics worked to get rid of it. I hope she is feeling better. Thanks

## 2024-03-05 NOTE — Telephone Encounter (Signed)
 Called interpreter services line and spoke with Marsh Dolly, that left a message for the patient to return our call at her earliest convenience.

## 2024-03-08 NOTE — Telephone Encounter (Signed)
 Called interpreter services line who called the patient and left a message with the results.  Patient was instructed to call us  back with any questions.

## 2024-03-26 ENCOUNTER — Other Ambulatory Visit: Payer: Self-pay | Admitting: Gastroenterology

## 2024-04-05 ENCOUNTER — Ambulatory Visit (INDEPENDENT_AMBULATORY_CARE_PROVIDER_SITE_OTHER): Admitting: Physician Assistant

## 2024-04-05 ENCOUNTER — Encounter: Payer: Self-pay | Admitting: Physician Assistant

## 2024-04-05 VITALS — BP 135/88 | HR 111 | Temp 98.1°F | Ht 63.0 in | Wt 141.6 lb

## 2024-04-05 DIAGNOSIS — I10 Essential (primary) hypertension: Secondary | ICD-10-CM

## 2024-04-05 DIAGNOSIS — R5383 Other fatigue: Secondary | ICD-10-CM

## 2024-04-05 DIAGNOSIS — R002 Palpitations: Secondary | ICD-10-CM | POA: Diagnosis not present

## 2024-04-05 DIAGNOSIS — L299 Pruritus, unspecified: Secondary | ICD-10-CM

## 2024-04-05 MED ORDER — MECLIZINE HCL 25 MG PO TABS: 25.0000 mg | ORAL_TABLET | Freq: Two times a day (BID) | ORAL | 1 refills | Status: AC | PRN

## 2024-04-05 MED ORDER — ACETIC ACID 2 % OT SOLN: 4.0000 [drp] | OTIC | 0 refills | Status: AC

## 2024-04-05 NOTE — Patient Instructions (Signed)
 It was great to see you!  Start acetic acid for your itchy ears  Will refer to cardiology for your palpitations  Let's follow-up in 3 months, sooner if you have concerns.  Take care,  Alexander Iba PA-C

## 2024-04-05 NOTE — Progress Notes (Signed)
 Carla [name] is a 53 y.o. female here for a new problem.  History of Present Illness:   Chief Complaint  Patient presents with   Fatigue    States she is tired all the time. States a problem with breathing good. States right side of face is swelling every day. States she is always thirsty.     HPI - Pt with in-person interpreter in office.  Fatigue: Pt complains of fatigue.  Pt also complains of difficulty breathing, low energy, and low pulse rate. She notes is on Meclizine  25 mg BID as needed for dizziness. She also saw gastroenterology in February for H. Pylori. Per pt, she finished all 3 prescribed antibiotics and already took the one refill, as well as a f/u check. She reports feeling sadness related to her husband's prostate cancer. She mentions her husband is having surgery at the end of the month. Endorses good sleeping habits, good familial relationships, and a support system. Pt would like a Meclizine  refill.  HTN Pt is on Metroprolol Tartrate 25 mg BID. Good compliance and tolerance. Pt was reminded a side effect of medication causes a low pulse rate. She reports slightly elevated BP readings at home, around 146 systolic. She also reports heart palpitations that occur only while she is at work, though not daily. Pt is agreeable to seeing cardiology again.  Itchy ears Pt complains of recurring itchy ears, primarily in the right ear. She notes her coworker is experiencing a similar problem. She states she is unsure if it is related to allergies. She reports taking an allergy pill once every two weeks. Pt also reports she did not pick up the previously prescribed Desonide  0.05% cream, but is agreeable to trying ear drops.  HLD Pt is currently not taking any medication. She has tried some in the past which were discontinued due to concern for side effect(s)  She would like to recheck her cholesterol levels today. She states she is unsure her cholesterol levels are  related to her fatigue.  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   Hyperlipidemia    Hypertension    Stroke (HCC)    09/2015   Vertigo      Social History   Tobacco Use   Smoking status: Never   Smokeless tobacco: Never  Vaping Use   Vaping status: Never Used  Substance Use Topics   Alcohol use: No   Drug use: No    Past Surgical History:  Procedure Laterality Date   RIGHT ARM SURGERY     TEE WITHOUT CARDIOVERSION N/A 10/13/2015   Procedure: TRANSESOPHAGEAL ECHOCARDIOGRAM (TEE);  Surgeon: Lenise Quince, MD;  Location: [facility] ENDOSCOPY;  Service: Cardiovascular;  Laterality: N/A;    Family History  Problem Relation Age of Onset   Leukemia Mother    Colon cancer Brother    Anxiety disorder Brother    Asthma Neg Hx    Cancer Neg Hx    Diabetes Neg Hx    Hyperlipidemia Neg Hx    Heart failure Neg Hx    Hypertension Neg Hx    Migraines Neg Hx    Rashes / Skin problems Neg Hx    Seizures Neg Hx    Stroke Neg Hx    Thyroid  disease Neg Hx     Allergies  Allergen Reactions   Gabapentin   Other (See Comments)    Panic attacks   Pravastatin  Anxiety   Tramadol  Nausea Only    Current Medications:   Current Outpatient Medications:    acetic acid 2 % otic solution, Place 4 drops into both ears every 3 (three) hours., Disp: 15 mL, Rfl: 0   amitriptyline  (ELAVIL ) 25 MG tablet, Take 1 tablet (25 mg total) by mouth at bedtime., Disp: 90 tablet, Rfl: 3   aspirin  EC 81 MG tablet, Take 81 mg by mouth daily. Swallow whole., Disp: , Rfl:    desonide  (DESOWEN ) 0.05 % cream, Apply topically 2 (two) times daily., Disp: 30 g, Rfl: 0   metoprolol  tartrate (LOPRESSOR ) 25 MG tablet, Take 0.5 tablets (12.5 mg total) by mouth 2 (two) times daily., Disp: 60 tablet, Rfl: 2   omeprazole  (PRILOSEC) 20 MG capsule, TOME 1 CAPSULA POR VIA  ORAL TODOS LOS DIAS, Disp: 90 capsule, Rfl: 1   meclizine  (ANTIVERT ) 25 MG tablet, Take 1 tablet (25 mg total) by mouth 2 (two) times daily as needed for dizziness., Disp: 30 tablet, Rfl: 1   Review of Systems:   Negative unless otherwise specified per HPI.  Vitals:   Vitals:   04/05/24 1433 04/05/24 1502  BP: (!) 142/94 135/88  Pulse: (!) 111   Temp: 98.1 F (36.7 C)   TempSrc: Temporal   SpO2: 100%   Weight: 141 lb 9.6 oz (64.2 kg)   Height: 5\' 3"  (1.6 m)      Body mass index is 25.08 kg/m.  Physical Exam:   Physical Exam Vitals and nursing note reviewed.  Constitutional:      General: She is not in acute distress.    Appearance: She is well-developed. She is not ill-appearing or toxic-appearing.  Cardiovascular:     Rate and Rhythm: Normal rate and regular rhythm.     Pulses: Normal pulses.     Heart sounds: Normal heart sounds, S1 normal and S2 normal.  Pulmonary:     Effort: Pulmonary effort is normal.     Breath sounds: Normal breath sounds.  Skin:    General: Skin is warm and dry.  Neurological:     Mental Status: She is alert.     GCS: GCS eye subscore is 4. GCS verbal subscore is 5. GCS motor subscore is 6.  Psychiatric:        Speech: Speech normal.        Behavior: Behavior normal. Behavior is cooperative.     Assessment and Plan:   1. Essential hypertension (Primary) Normotensive on recheck Continue metoprolol  12.5 mg twice daily Continue compliance with medication - Lipid panel - Ambulatory referral to Cardiology - Comprehensive metabolic panel with GFR  2. Palpitations No red flag symptom(s)  Referral to cardiology She has addressed this in the past with cardiology but it has been 8 years Update blood work and provide recommendations  ER precautions reviewed/advised - Lipid panel - Ambulatory referral to Cardiology - Comprehensive metabolic panel with GFR  3. Fatigue, unspecified type Continue efforts at healthy lifestyle, regular  diet/nutrition Update blood work to rule out organic cause of symptom(s) Recommend ongoing management of anxiety and depression as able  4. Itching of ear Unclear etiology Will trial acetic acid drops Consider ENT referral in future if needed  I, Timoteo Force, acting as a Neurosurgeon for [employer], [place]., have documented all relevant documentation on the behalf of Alexander Iba, [place], as directed by  Alexander Iba, PA while in the presence of Alexander Iba,  PA.  Emmett Harman, PA, have reviewed all documentation for this visit. The documentation on 04/05/24 for the exam, diagnosis, procedures, and orders are all accurate and complete.  Alexander Iba, PA-C

## 2024-04-06 LAB — COMPREHENSIVE METABOLIC PANEL WITH GFR
ALT: 20 U/L (ref 0–35)
AST: 16 U/L (ref 0–37)
Albumin: 4.1 g/dL (ref 3.5–5.2)
Alkaline Phosphatase: 80 U/L (ref 39–117)
BUN: 11 mg/dL (ref 6–23)
CO2: 31 meq/L (ref 19–32)
Calcium: 9.3 mg/dL (ref 8.4–10.5)
Chloride: 103 meq/L (ref 96–112)
Creatinine, Ser: 0.86 mg/dL (ref 0.40–1.20)
GFR: 77.12 mL/min (ref >60.00)
Glucose, Bld: 82 mg/dL (ref 70–99)
Potassium: 3.6 meq/L (ref 3.5–5.1)
Sodium: 140 meq/L (ref 135–145)
Total Bilirubin: 0.4 mg/dL (ref 0.2–1.2)
Total Protein: 6.9 g/dL (ref 6.0–8.3)

## 2024-04-06 LAB — LIPID PANEL
Cholesterol: 222 mg/dL — ABNORMAL HIGH (ref 0–200)
HDL: 61.8 mg/dL (ref >39.00)
LDL Cholesterol: 140 mg/dL — ABNORMAL HIGH (ref 0–99)
NonHDL: 159.72
Total CHOL/HDL Ratio: 4
Triglycerides: 101 mg/dL (ref 0.0–149.0)
VLDL: 20.2 mg/dL (ref 0.0–40.0)

## 2024-04-07 ENCOUNTER — Ambulatory Visit: Payer: Self-pay | Admitting: Physician Assistant

## 2024-04-07 ENCOUNTER — Other Ambulatory Visit: Payer: Self-pay | Admitting: Physician Assistant

## 2024-04-07 MED ORDER — LOVASTATIN 20 MG PO TABS: 20.0000 mg | ORAL_TABLET | Freq: Every day | ORAL | 3 refills | Status: DC

## 2024-06-22 ENCOUNTER — Ambulatory Visit (INDEPENDENT_AMBULATORY_CARE_PROVIDER_SITE_OTHER): Payer: Medicaid Other | Admitting: Adult Health

## 2024-06-22 ENCOUNTER — Encounter: Payer: Self-pay | Admitting: Adult Health

## 2024-06-22 VITALS — BP 148/88 | HR 87 | Ht 63.0 in | Wt 145.0 lb

## 2024-06-22 DIAGNOSIS — G43109 Migraine with aura, not intractable, without status migrainosus: Secondary | ICD-10-CM

## 2024-06-22 DIAGNOSIS — Z8673 Personal history of transient ischemic attack (TIA), and cerebral infarction without residual deficits: Secondary | ICD-10-CM | POA: Diagnosis not present

## 2024-06-22 DIAGNOSIS — G4489 Other headache syndrome: Secondary | ICD-10-CM | POA: Diagnosis not present

## 2024-06-22 DIAGNOSIS — R42 Dizziness and giddiness: Secondary | ICD-10-CM | POA: Diagnosis not present

## 2024-06-22 DIAGNOSIS — R200 Anesthesia of skin: Secondary | ICD-10-CM

## 2024-06-22 MED ORDER — ASPIRIN 81 MG PO TBEC: 81.0000 mg | DELAYED_RELEASE_TABLET | Freq: Every day | ORAL | 12 refills | Status: AC

## 2024-06-22 MED ORDER — AMITRIPTYLINE HCL 25 MG PO TABS: 25.0000 mg | ORAL_TABLET | Freq: Every day | ORAL | 3 refills | Status: AC

## 2024-06-22 NOTE — Progress Notes (Signed)
 [name] [address] [place]. [place] [address] [phone]       OFFICE FOLLOW UP NOTE  Carla [name] Date of Birth:  [birth year] Medical Record Number:  [id number]   [facility] provider: Dr. Rosemarie Reason for visit:: hx of stroke, migraines, vertigo  Chief Complaint  Patient presents with   Headache    Rm 3 with [facility] interpreter Pt is well and stable, reports migraines have improved since last visit. She has maybe 1 a month. She mentions numbness in R arm and imbalance. No other concerns.      HPI:  Update 06/22/2024 JM: Patient returns for 63-month follow-up accompanied by [facility] health interpreter.  Reports migraines have been well controlled. Typically has about 1 migraine per month, can have occasional mild tension type headaches but not debilitating.  Remains on amitriptyline  25 mg nightly, tolerating well. Will use tylenol  as needed with benefit.   Does mention right arm numbness over the past 3-4 weeks. Hx of right shoulder/bicep surgery with chronic limited ROM. Reports numbness and discomfort from shoulder down. Usually occurs upon awakening, will move arm around and symptoms will improve. Denies being present on certain fingers, feels whole hand is affected. Denies weakness or RLE symptoms, denies facial symptoms. Does have some increased tension in her neck from stress but denies significant pain.  Continues to experience intermittent vertigo.  Feels this has slightly worsened over the past several months, has been taking meclizine  daily but feels this has been causing fatigue.  Never pursued vestibular rehab as previously ordered.  No other new stroke/TIA or neurological symptoms.  Remains on aspirin  81mg  daily without side effects.Was started on lovastatin  20mg  daily back in May after PCP checked lipid panel with LDL 140, denies side effects. Recommended f/u visit next month but currently no f/u visit scheduled.  Blood pressure today initially elevated at  180/98 and upon recheck 148/88. She does not monitor at home.       History provided for reference purposes only Update 12/16/2023 JM: Patient returns for follow-up visit after prior visit 6 months ago.  She is accompanied by [facility] interpreter.  At prior visit, she was started on Emgality  for continued migraines in addition to amitriptyline .  She notified office in August stating Emgality  causing nausea, discussed switching to Ajovy but patient declined further injections therefore recommended Qulipta, patient reported insurance expiring soon therefore proceeded with gabapentin  300 mg nightly.  Patient called back several weeks later reporting gabapentin  causing panic attacks midday.    Main concerns today is in regards to continued vertigo and allergies.   Did not do vestibular therapy (previously ordered by PCP) due to losing health coverage but she now has insurance again and interested in pursing. Continues on meclizine , has been taking 1 tablet/day as needed with little benefit.    She was encouraged to f/u with her PCP regarding allergies, she has been taking Zyrtec  for concern of side effects.    Has not had any recent headaches or migraines. Continues on amitriptyline  25mg  nightly.  Continue to have memory loss, unchanged since prior visit.   Denies new stroke/TIA symptoms.  Remains on aspirin  and fenofibrate .  Routinely follows with PCP for stroke risk factor management.  Update 06/11/2023 JM: Patient returns for follow-up visit after prior visit over 1.5 years ago.  She is accompanied by interpreter.  Reports 3-4 migraines per month, she continues on amitriptyline  but questions other treatment options as she complains of severe dry mouth. Will use Tylenol   with benefit.  Typically last 2 to 2.5 hours. Can worsen with increased stress.  No change in migraine characteristics since prior visit.  Continued memory difficulties such as when she is driving she will forget where she is  going and also difficulty remembering appointments.  This is unchanged since prior visit.  Vertigo - c/o recent worsening with PCP who referred to vestibular rehab.  Continues meclizine  only as needed with benefit.   Stable from stroke standpoint.  Denies new stroke/TIA symptoms.  Compliant on aspirin  and fenofibrate .  Routinely follows with PCP for stroke risk factor management.  Update 10/17/2021 JM: Returns for 62-month follow-up accompanied by [facility] interpreter  Migraines stable - reports 2-3 migraines per month.  Remains on amitriptyline  25 mg nightly Does endorse awakening with very mild headache every morning  Vertigo stable as long as she takes meclizine  once daily. She did try to stop meclizine  but felt nauseous without it. Feels like liquid is draining from left ear - was seen by PCP back in August regarding this concern - she was referred to vestibular rehab due to worsening vertigo but did not attend due to lack of insurance. Reports when liquid is coming from ears is when she will feel nauseous and loses balance. Reports seeing a doctor for this concern last year but unsure if this was a specialist (unable to view via epic)  She has also been experiencing forgetfulness which is not new. Examples include she will forget what she is supposed to be doing in the moment but will remember later on. She may misplace items but again remember where she placed them later on. Reports sleeping well at night but is fatigued during the day. Reports husband has told her she snores. No witnessed apneas.   No new stroke/TIA symptoms. Compliant on aspirin  81 mg daily and fenofibrate  -denies side effects Recent LDL 116 (PCP increased fenofibrate  from 54mg  to 145 mg daily) Blood pressure today 144/86  No further concerns at this time   Update 04/16/2021 JM: Carla [name] returns for 70-month follow-up accompanied by [facility] interpreter.  Migraines have been stable - reports 1-2 per month  typically lasting 30-40 minutes. Will use tylenol  on occasion with benefit. She has remained on amitrityline but reports not being on effexor  (denies ever being on it)  Vertigo stable occurring 1-2x weekly - will use meclizine  only as needed with benefit  Stable from stroke standpoint without new stroke/TIA symptoms.  Reports intermittent use of aspirin  81mg  daily and daily use of fenofibrate  without associated side effects.  PCP recently recommended started atorvastatin  but she denies receiving prescription due to lipid panel showing LDL 122.  She has trialed atorvastatin  in the past but unsure if reported side effects at that time from statin or multiple other medications being taken at this time.  She is willing to trial again.  Blood pressure today 128/87.    No further concerns at this time   Update 10/16/2020 JM: Carla [name] returns for 25-month follow-up accompanied by interpreter.  At prior visit, complaints of continued migraines which have since greatly improved with use of amitriptyline  25 mg nightly and venlafaxine  37.5 mg daily tolerating well without side effects.  Reports 1 migraine over the past month.  She does report occasional tension type headaches but has been told likely due to new rx lenses and have been slowly improving.  Prior complaints of intermittent vertigo but overall stable and will take meclizine  only as needed.  She does report evaluation  by ENT Dr. Karis for vertigo approximately 3 to 4 months ago but has not received results.  Stable from TIA/stroke standpoint without new or reoccurring stroke/TIA symptoms.  Remains on aspirin  81 mg daily and omega-3 for secondary stroke prevention of side effects.  Blood pressure today 125/76.  No further concerns at this time.  Update 06/27/2020 JM: Carla [name] returns for follow-up accompanied by interpreter regarding reoccurrence of chronic migraines with underlying history of migraines, cryptogenic stroke 2016, TIA, vertigo, depression  and anxiety. Evaluated at [facility] ED on 06/13/2020 due to pain and numbness in her left arm accompanied by headache. All work-up unremarkable including D-dimer, EKG, chest x-ray and CT head and treated for presumed cervical radiculopathy with naproxen  and advised to follow-up with PCP.  Evaluated by PCP 06/19/2020 with complete resolution of symptoms. Of note, presented to ED on 03/16/2020 with chief complaint of confusion and left arm numbness with headache and diagnosed with complex migraine. Migraine occurrence has improved since prior visit with ongoing use of amitriptyline  10 mg nightly.  Severe migraine occurrence 4 -5 times per months but does experience tension type headaches almost every other day lasting for approximately 1 hour and are not debilitating.  Reports tension type headaches occur with increased stress or anxiety.  She has not had any reoccurrence of left arm numbness or any other neurological symptoms Intermittent vertigo and PCP recently referred to ENT She remains uninsured but is currently in the process of applying for Medicaid   Update 03/21/2020 JM, Carla [name] is being seen today accompanied by her son who assist with interpretation due to recent emergency room visit with left arm numbness and confusion accompanied by headache on 03/16/2020.  MRI brain negative for acute intracranial abnormality.  She was diagnosed with complex migraines as symptoms objective and no focal deficit identified.  She was advised to follow-up outpatient for further evaluation.  Prior medical history of cryptogenic stroke 2016, TIA, vertigo and migraines.  Previously treated in this office for migraine management on Topamax  25 mg twice daily but self discontinued due to difficulty tolerating.  She has not had any difficulties with migraines in over the past year and has not required any treatment.  Typical migraines recurred approximately 2 weeks ago located right temporal and periorbital area with pulsating  quality with blurred vision, photophobia, phonophobia and nausea.  Symptoms consistent with previously known migraines.  She may also experience pain in occipital region.  She has been experiencing migraines every other day which last up until 2 hours after laying down and resting.  She has not had any recurrence of confusion or numbness/tingling.  She also reports symptoms of fatigue, dizziness, and shortness of breath which appears to be anxiety related per son.  She also recently self discontinued lovastatin  due to subjective increased anxiety after taking.  Previously discussed underlying anxiety at prior visits which likely contributing and/or worsening symptoms but she has previously declined further management or counseling.  She has continued on aspirin  81 mg daily without bleeding or bruising.  Blood pressure today 128/83.  No further concerns at this time.   Initial visit 09/29/2018 Dr. Rosemarie:  Carla [name] is a pleasant 74 year Hispanic lady originally from [place] who was seen today for evaluation for headaches.  She is accompanied by her friend as well as Spanish language interpreter who provides translation for this visit.  History is obtained from them, review of electronic medical records and have personally reviewed imaging films.  Patient has been  complaining of increasing headaches for the last 2 months.  She describes this headache as being right temporal and frontal mostly throbbing in nature though fluctuating in severity from 6/10 to 10/10.  Headache is accompanied by nausea vomiting light and sound sensitivity.  She has to stop her activities and lie down and sleeping as well as taking Tylenol  seems to help.  Headaches are accompanied by a single vision spots and scintillations and blurred vision.  She denies any speech difficulties, vertigo, diplopia, gait or balance difficulties with headaches.  Headaches usually last about 30 to 40 minutes.  Headaches of late have been occurring about  2-3 times per week.  She does give a remote history of infrequent headaches which sound like migraine headaches and was similar but were mostly perimenstrual and not bad.  The patient also states in September 2019 she had a episode of speech difficulties as well as dizziness.  She was seen by primary care physician who states that the patient had a headache as well as feeling of dizziness and some speech difficulties.  This happened following a stressful event.  An outpatient MRI was ordered which was done on 08/05/2018 which I personally reviewed shows a few nonspecific white matter hyperintensities but no acute abnormality or definite stroke.  Patient states her symptoms improved within a few days but the headaches have persisted since then.  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 MRA of the brain showed slight diminished peripheral branches in the left MCA but no large vessel stenosis or occlusion. Carotid ultrasound showed no significant extracranial stenosis. Transthoracic echo showed normal ejection fraction. Transesophageal echocardiogram showed no cardiac source of embolism or PFO. Antiphospholipid antibodies and lupus anticoagulant were both negative. Lipid profile showed elevated total cholesterol 207 and LDL 139 mg percent. Hemoglobin A1c was 5.6. Patient was started on aspirin  as well as protocol. She states her speech difficulty and right-sided weakness completely improved.  She was placed on aspirin  and pravastatin  which she actually did well on but for unclear reason she discontinued both medications.  She is currently on Lipitor 40 which was started a few months ago by primary physician but she is complaining of dizziness on this medication and wants to change it or stop it.  She states she had slight balance  difficulties from her previous stroke but had recovered well from that.  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  She states her blood pressure is under good control.  Last lipid profile checked in August 2019 showed LDL of 120 following which Lipitor was added.  I had recommended doing a transcranial Doppler bubble study at last visit with me in 2017 but for some unclear reason this was never done.  Update 12/30/2018: Carla [name] is being seen today for follow-up visit for migraines and is accompanied by interpreter as patient is primarily Spanish-speaking.  Upon initial visit with Dr. Rosemarie on 09/29/2018 (visit info below), headaches were not frequent enough to justify initiating migraine prophylaxis.  She did call back on 12/02/2018 reporting daily migraines and therefore initiated Topamax  25 mg twice daily.  She took this medication for 4 days but discontinued as medication was making her too fatigued.  She consider taking 1 tab at night only but wanted to speak with us  in  regards to this first.  She was evaluated in the ED on 12/19/2018 and returned on 12/23/2018 due to vaginal bleeding.  Apparently, all medications discontinued after 12/23/2018 visit and was started on Provera  for 10-day course with recommending follow-up with OB/GYN for further evaluation.  She reports today that her headaches have improved and not experience a headache since ED admission on 12/23/2018.  She does have an appointment scheduled at [facility] on 01/12/2019 for further evaluation.  She denies any continued vaginal bleeding at this time.  Blood pressure today satisfactory 130/68. It was recommended after prior visit to undergo transcranial Doppler with bubble study to assess for possible PFO.  This was obtained on 10/14/2018 and was negative for PFO.  Update 06/21/2019 : She returns for follow-up after last visit 5 months ago.  She is accompanied by a [place] language interpreter who was  present throughout the visit and interprets for her.  Patient states her main complaint today is that her migraine headaches seem to have come back.  In the last 3 months she has had increased frequency of headaches which occur 3 to 4 days/week.  The headache starts off as a throbbing pain around her right temple as well as periorbital region becomes unbearable 10/10 in severity.  She has relied on.  Light and sound do worsen the headache.  She does not throw up or feel nauseous.  She does not have visual symptoms with the headaches.  Headache may last for most of the day.  She is unable to identify specific triggers for headaches but does feel tightness in the back of her head and neck muscles.  She has not tried triptans or prescription migraine specific medications she has tried taking Tylenol  and ibuprofen  without relief.  She has been taking naproxen  which seems to work better.  She has been taking them 3 to 4 days a week.  She was previously on Topamax  but when she started having dysfunctional uterine bleeding all the medications were stopped including Topamax .  She is unable to tell me if she had any bad side effects on it.  Patient recently changed her primary care physician and a new physician assistant discontinued protocol for unclear reason.  She remains on aspirin  which is tolerating well without bruising or bleeding.  Her blood pressure is under good control and today it is 142/83.  She has had no recurrent stroke or TIA symptoms.  Update 09/02/2019: Carla [name] is being seen today for 41-month follow-up regarding migraine headaches accompanied by interpreter.  After prior visit, Topamax  25 mg daily was restarted due to worsening migraines as well as Relpax  for emergent relief.  She did not tolerate Topamax  as she reports this caused weight loss and short-term memory loss.  She was unable to afford Relpax .  She has recently been experiencing migraines 2 times weekly and typically lasts for 1 hour total  but during this time, migraines are debilitating and continues to be located right temporal area.  She has not trialed any other type of migraine medication.  Recent diagnosis of COVID-19 in 06/2019 and feels as though she has not completely recovered mentally and physically.  After prior visit, lipid panel obtained and recommended restart pravastatin  but due to side effects, PCP initiated Crestor.  She reports difficulty tolerating Crestor but plans on following up with PCP for further monitoring and management.  Blood pressure today 135/91.  Update 11/04/2019: Carla [name] is a 53 year old female who is being seen today by patient request  due to concerns of balance difficulties concern for stroke.  She is accompanied today by a interpreter.  She did not seek medical treatment at onset.  Approximately 1.5 weeks ago, she states she started experiencing intermittent balance impairment with a swaying type sensation and room spinning.  Symptoms severe where she will have to hold onto something and close her eyes.  She states she experienced same type of symptoms with prior stroke.  She denies associated headache, neck pain, speech difficulty, visual changes, weakness or numbness/tingling.  She denies increased stress or anxiety/depression.  She states standing straight up or laying flat or with head movement induces the symptoms.  She denies currently feeling symptoms at today's visit.  She does endorse recently stopping her metoprolol  3 days ago due to increased stress and irritability symptoms.  She does monitor blood pressure at home and SBP 130s.  She is unable to endorse diastolic pressures or heart rates.  Blood pressure today satisfactory at 132/96.  He does endorse continuation of aspirin  81 mg daily and recently started on pravastatin  approximately 3 weeks ago by PCP.  Update 11/09/2019: Carla [name] is a 53 year old female who is being seen today by patient request due to recent ED admission on 11/06/2019  for ongoing concerns of dizziness previously evaluated in office 5 days ago.  She is accompanied by interpreter.  Per review of ED note, she reported worsening dizziness and headache 2 days prior to ED admission concerning for stroke.  CT head and MRI brain no acute abnormalities.  She was advised to follow-up with PCP and neurology at discharge.  She has been doing well since discharge without any reoccurring vertigo or dizziness symptoms or headaches.  She has continued to take meclizine  with benefit 25 mg tablets twice daily.  She continues on aspirin  81 mg daily and pravastatin  for secondary stroke prevention without side effects. Blood pressure today 124/71.  No further concerns at this time.  Update 01/04/2020: Carla [name] is a 53 year old female who is being seen today for follow-up regarding prior concerns of dizziness accompanied by interpreter.  Denies residual/dizziness symptoms.  Continues twice daily meclizine  with occasional fatigue.  Continues on aspirin  81 mg daily and pravastatin  for secondary stroke prevention without side effects.  Blood pressure today 127/78.  Mild short-term memory loss but endorses ongoing improvement.  No concerns at this time.    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   Hyperlipidemia    Hypertension    Stroke (HCC)    09/2015   Vertigo     Social History:  Social History   Socioeconomic History   Marital status: Married    Spouse name: Not on file   Number of children: 2   Years of education: Not on file   Highest education level: Not on file  Occupational History   Occupation: Financial trader   Occupation: clean houses  Tobacco Use   Smoking status: Never   Smokeless tobacco: Never  Vaping  Use   Vaping status: Never Used  Substance and Sexual Activity   Alcohol use: No   Drug use: No   Sexual activity: Not Currently    Birth control/protection: None  Other Topics Concern  Not on file  Social History Narrative   From [place]   Lived in [place] for 20 years, and been in [place] for 6 years   Husband and son, 70   Works: clean homes      Pt lives with husband    Pt works    Social Drivers of Corporate investment banker Strain: Not on [employer] Insecurity: Not on file  Transportation Needs: Not on file  Physical Activity: Not on file  Stress: Not on file  Social Connections: Not on file  Intimate Partner Violence: Not on file    Medications:   Current Outpatient Medications on File Prior to Visit  Medication Sig Dispense Refill   acetic acid  2 % otic solution Place 4 drops into both ears every 3 (three) hours. 15 mL 0   amitriptyline  (ELAVIL ) 25 MG tablet Take 1 tablet (25 mg total) by mouth at bedtime. 90 tablet 3   desonide  (DESOWEN ) 0.05 % cream Apply topically 2 (two) times daily. 30 g 0   lovastatin  (MEVACOR ) 20 MG tablet Take 1 tablet (20 mg total) by mouth at bedtime. 90 tablet 3   meclizine  (ANTIVERT ) 25 MG tablet Take 1 tablet (25 mg total) by mouth 2 (two) times daily as needed for dizziness. 30 tablet 1   metoprolol  tartrate (LOPRESSOR ) 25 MG tablet Take 0.5 tablets (12.5 mg total) by mouth 2 (two) times daily. 60 tablet 2   omeprazole  (PRILOSEC) 20 MG capsule TOME 1 CAPSULA POR VIA ORAL TODOS LOS DIAS 90 capsule 1   No current facility-administered medications on file prior to visit.    Allergies:   Allergies  Allergen Reactions   Gabapentin  Other (See Comments)    Panic attacks   Pravastatin  Anxiety   Tramadol  Nausea Only    Physical Exam  Today's Vitals   06/22/24 1500 06/22/24 1503 06/22/24 1532  BP: (!) 180/98 (!) 162/94 (!) 148/88  Pulse: 87    Weight: 145 lb (65.8 kg)    Height: 5' 3 (1.6 m)     Body mass index is 25.69  kg/m.  Physical exam General: well developed, well nourished, pleasant middle-aged Hispanic female, seated, in no apparent distress Musculoskeletal: Chronic limited RUE deltoid ROM  Neurologic Exam Mental Status: awake and fully alert. Primarily Spanish-speaking with limited English. Denies speech and language concerns.  Oriented to place and time. Recent memory subjectively impaired and remote memory intact. Attention span, concentration and fund of knowledge appropriate. Mood and affect appropriate Cranial Nerves: Pupils equal, briskly reactive to light.  Extraocular movements full without nystagmus. Visual fields full to confrontation. Hearing intact. Facial sensation intact. Face, tongue, palate moves normally and symmetrically.   Motor: Normal bulk and tone. Normal strength in all tested extremity muscles. Sensory.: intact to touch , pinprick , position and vibratory sensation.  Coordination: Rapid alternating movements normal in all extremities. Finger-to-nose and heel-to-shin performed accurately bilaterally. Gait and Station: Arises from chair without difficulty. Stance is normal. Gait demonstrates normal stride length and balance without use of AD Reflexes: 1+ and symmetric. Toes downgoing.        ASSESSMENT/PLAN:  Ji Fairburn is a 53 year old Hispanic lady with with PMH of cryptogenic stroke 2016, TIA, HDL, HTN, BPPV, anxiety and migraines.  History of chronic migraines with worsening 02/2020 with migraine accompanied by transient left arm numbness and confusion with MRI unremarkable for acute abnormalities and diagnosed with complicated migraine.  She also has chronic vertigo. Today, reports 3-4 week onset of  intermittent RUE numbness and discomfort.     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 - Migraines well-managed, currently 1/month -continue amitriptyline  25mg  nightly  -Continue Tylenol  as needed -tried/failed:  Topiramate , amitriptyline , metoprolol , Emgality , gabapentin  -Advised to call if headaches return, can consider trialing Qulipta if needed  Vertigo, intermittent -Use of meclizine  25mg  BID PRN  -did advise to try half tab to reduce possible side effect of fatigue or can try nondrowsy Dramamine -consider vestibular therapy if needed but previously has not followed through after orders placed  RUE numbness -neuro exam intact, no evidence of weakness or sensory deficit, no other associated symptoms -suspect nerve compressive as symptoms present upon awakening and resolve after movement - Discussed conservative measures for now such as avoiding positions/movements that trigger symptoms and use of braces at night. - advised to call if symptoms worsen and will consider pursuing EMG/NCV - low suspicion stroke related, will hold off on imaging for now, patient agrees with plan  History of stroke/TIA -Continue aspirin  81 mg and lovastatin  20mg  daily for secondary stroke prevention managed/prescribed by PCP - advised routine monitoring of blood pressure at home and keeping log. Advised to f/u with PCP next month as advised for further recommendations -Continue to follow with PCP for HTN and HLD management and secondary stroke risk factor management with BP goal<130/90 and LDL goal<70     Follow-up in 8 months or call earlier if needed   CC:  Job Lukes, [place]    I personally spent a total of 45 minutes in the care of the patient today including preparing to see the patient, performing a medically appropriate exam/evaluation, counseling and educating, placing orders, and documenting clinical information in the EHR.   Harlene Bogaert, AGNP-BC  [facility] [address] [place], [place] [address]  Phone [phone] Fax [phone] Note: This document was prepared with digital dictation and possible smart phrase technology. Any transcriptional errors that  result from this process are unintentional.

## 2024-06-22 NOTE — Patient Instructions (Addendum)
 Your Plan:  Continue amitriptyline  25mg  nightly for migraine prevention   Continue to monitor right arm symptoms, if this should worsen please let me know  Avoid positions that worsen symptoms such as bent elbow or wrist while sleeping; can look into braces if needed  Continue aspirin  81mg  daily and lovastatin  for secondary stroke prevention   Continue to follow with PCP for stroke risk factor management   Continue meclizine  as needed for vertigo, you can also try non drowsy Dramamine (can be purchased over the counter)   Recommend routine monitoring of blood pressure at home over the next several weeks and keep a log. Follow up with your PCP next month as advised.        Follow up in 8 months or call earlier if needed      Thank you for coming to see us  at [facility]. I hope we have been able to provide you high quality care today.  You may receive a patient satisfaction survey over the next few weeks. We would appreciate your feedback and comments so that we may continue to improve ourselves and the health of our patients.     Way to help manage high blood pressure  Walk and exercise daily Regular exercise helps make your heart stronger and more efficient at pumping blood, which lowers the pressure in your arteries. In fact, getting 150 minutes of moderate exercise per week, such as walking, or 75 minutes of vigorous exercise, such as running, can help lower blood pressure and improve heart health Lose weight Losing weight can significantly lower high blood pressure. This effect is even more pronounced when you exercise Eat potassium rich foods vegetables, especially leafy greens, tomatoes, potatoes, and sweet potatoes fruit, including melons, bananas, avocados, oranges, and apricots dairy, such as milk and yogurt tuna and salmon nuts and seeds beans Cut back on caffeine Learn to manage stress Chronic stress can contribute to high blood pressure.  Finding ways to manage stress can help Eat dark chocolate or cocoa  Dark chocolate and cocoa powder contain plant compounds that help relax blood vessels, which may lower blood pressure Lose weight  Losing weight can significantly lower high blood pressure. This effect is even more pronounced when you exercise Cut added sugar and refined foods Refined carbs, especially sugar, may raise blood pressure. Some studies have shown that low carb diets may help reduce your blood pressure levels Eat berries Berries are full of more than just juicy flavor. They're also packed with polyphenols, natural plant compounds that are good for your heart. Polyphenols can reduce the risk of stroke, heart conditions, and diabetes and improve blood pressure, insulin  resistance, and systemic inflammation Eat calcium  rich foods Calcium -rich diets are linked to healthy blood pressure levels. You can get calcium  by eating dark leafy greens and tofu, as well as dairy Eat foods rich in magnesium Magnesium is an important mineral that helps blood vessels relax Magnesium is an essential mineral that helps regulate blood pressure. It can be found in a wide range of whole foods, including legumes and whole grains Types of food that may help include ruits, such as kiwi and oranges vegetables, for instance, green leafy vegetables and beets nuts, for example, pistachios and walnuts oily fish, such as mackerel spices, such as cinnamon

## 2024-07-11 ENCOUNTER — Other Ambulatory Visit: Payer: Self-pay | Admitting: Physician Assistant

## 2024-07-23 ENCOUNTER — Ambulatory Visit: Admitting: Physician Assistant

## 2024-07-28 ENCOUNTER — Ambulatory Visit: Admitting: Physician Assistant

## 2024-07-30 ENCOUNTER — Encounter: Payer: Self-pay | Admitting: Physician Assistant

## 2024-07-30 ENCOUNTER — Ambulatory Visit (INDEPENDENT_AMBULATORY_CARE_PROVIDER_SITE_OTHER): Admitting: Physician Assistant

## 2024-07-30 VITALS — BP 160/90 | HR 113 | Temp 98.2°F | Ht 63.0 in | Wt 147.4 lb

## 2024-07-30 DIAGNOSIS — I10 Essential (primary) hypertension: Secondary | ICD-10-CM | POA: Diagnosis not present

## 2024-07-30 DIAGNOSIS — R220 Localized swelling, mass and lump, head: Secondary | ICD-10-CM | POA: Diagnosis not present

## 2024-07-30 DIAGNOSIS — D1803 Hemangioma of intra-abdominal structures: Secondary | ICD-10-CM | POA: Diagnosis not present

## 2024-07-30 DIAGNOSIS — R5383 Other fatigue: Secondary | ICD-10-CM | POA: Diagnosis not present

## 2024-07-30 NOTE — Progress Notes (Signed)
 Carla [name] is a 53 y.o. female here for a follow up of a pre-existing problem.  History of Present Illness:   Chief Complaint  Patient presents with   Abdominal Pain    Pt c/ abdominal bloating x several months, sometimes nausea, but no vomiting.   Patient with in-person interpreter  Discussed the use of AI scribe software for clinical note transcription with the patient, who gave verbal consent to proceed.  History of Present Illness Carla [name] is a 53 year old female with liver masses who presents with abdominal bloating and fatigue.  She experiences abdominal bloating and a sensation of inflammation in her stomach for several days, affecting her daily activities. There is discomfort impacting her breathing and contributing to feelings of depression, but no pain is present. She has a known liver mass measuring 10 centimeters and is concerned about its potential growth. Fatigue is persistent, and she desires surgical removal of the mass.  Hypertension is monitored at home with readings typically around 136-140 mmHg. She takes her medication as prescribed, lopressor  12.5 mg twice daily twice daily, but notes elevated blood pressure during a recent neurologist visit.  Intermittent facial swelling occurs, suspected to be allergy-related, more pronounced in the mornings, sometimes with redness. She has gained approximately 10 pounds over the past five months, attributed to increased sweet bread consumption. Daily nausea is present without associated pain. She has dental issues, including a broken tooth, and has not visited a dentist recently due to a negative experience with anesthesia.   Wt Readings from Last 3 Encounters:  07/30/24 147 lb 6.1 oz (66.9 kg)  06/22/24 145 lb (65.8 kg)  04/05/24 141 lb 9.6 oz (64.2 kg)   Wt Readings from Last 3 Encounters:  07/30/24 147 lb 6.1 oz (66.9 kg)  06/22/24 145 lb (65.8 kg)  04/05/24 141 lb 9.6 oz (64.2 kg)   This SmartLink has not been  configured with any valid records.   Wt Readings from Last 15 Encounters:  07/30/24 147 lb 6.1 oz (66.9 kg)  06/22/24 145 lb (65.8 kg)  04/05/24 141 lb 9.6 oz (64.2 kg)  01/05/24 136 lb (61.7 kg)  12/30/23 136 lb (61.7 kg)  12/26/23 138 lb 14.2 oz (63 kg)  12/16/23 140 lb (63.5 kg)  12/02/23 141 lb (64 kg)  09/16/23 138 lb 6.1 oz (62.8 kg)  08/01/23 139 lb 6.1 oz (63.2 kg)  06/11/23 139 lb (63 kg)  05/27/23 137 lb 6.1 oz (62.3 kg)  05/14/23 135 lb 9.6 oz (61.5 kg)  04/01/23 134 lb 4 oz (60.9 kg)  01/29/23 138 lb (62.6 kg)      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   Hyperlipidemia    Hypertension    Stroke (HCC)    09/2015   Vertigo      Social History   Tobacco Use   Smoking status: Never   Smokeless tobacco: Never  Vaping Use   Vaping status: Never Used  Substance Use Topics   Alcohol use: No   Drug use: No    Past Surgical History:  Procedure Laterality Date   RIGHT ARM SURGERY     TEE WITHOUT CARDIOVERSION N/A 10/13/2015   Procedure: TRANSESOPHAGEAL ECHOCARDIOGRAM (TEE);  Surgeon: Redell GORMAN Shallow, MD;  [facility]: [facility] ENDOSCOPY;  Service: Cardiovascular;  Laterality: N/A;  Family History  Problem Relation Age of Onset   Leukemia Mother    Colon cancer Brother    Anxiety disorder Brother    Asthma Neg Hx    Cancer Neg Hx    Diabetes Neg Hx    Hyperlipidemia Neg Hx    Heart failure Neg Hx    Hypertension Neg Hx    Migraines Neg Hx    Rashes / Skin problems Neg Hx    Seizures Neg Hx    Stroke Neg Hx    Thyroid  disease Neg Hx     Allergies  Allergen Reactions   Gabapentin  Other (See Comments)    Panic attacks   Pravastatin  Anxiety   Tramadol  Nausea Only    Current Medications:   Current Outpatient Medications:    acetic acid  2 % otic solution, Place 4 drops into both ears every 3  (three) hours., Disp: 15 mL, Rfl: 0   amitriptyline  (ELAVIL ) 25 MG tablet, Take 1 tablet (25 mg total) by mouth at bedtime., Disp: 90 tablet, Rfl: 3   aspirin  EC 81 MG tablet, Take 1 tablet (81 mg total) by mouth daily. Swallow whole., Disp: 30 tablet, Rfl: 12   desonide  (DESOWEN ) 0.05 % cream, Apply topically 2 (two) times daily., Disp: 30 g, Rfl: 0   lovastatin  (MEVACOR ) 20 MG tablet, Take 1 tablet (20 mg total) by mouth at bedtime., Disp: 90 tablet, Rfl: 3   meclizine  (ANTIVERT ) 25 MG tablet, Take 1 tablet (25 mg total) by mouth 2 (two) times daily as needed for dizziness., Disp: 30 tablet, Rfl: 1   metoprolol  tartrate (LOPRESSOR ) 25 MG tablet, TAKE 0.5 TABLETS BY MOUTH 2 TIMES DAILY., Disp: 90 tablet, Rfl: 1   omeprazole  (PRILOSEC) 20 MG capsule, TOME 1 CAPSULA POR VIA ORAL TODOS LOS DIAS, Disp: 90 capsule, Rfl: 1   Review of Systems:   Negative unless otherwise specified per HPI.  Vitals:   Vitals:   07/30/24 1408 07/30/24 1430  BP: (!) 170/90 (!) 160/90  Pulse: (!) 113   Temp: 98.2 F (36.8 C)   TempSrc: Temporal   SpO2: 99%   Weight: 147 lb 6.1 oz (66.9 kg)   Height: 5' 3 (1.6 m)      Body mass index is 26.11 kg/m.  Physical Exam:   Physical Exam Vitals and nursing note reviewed.  Constitutional:      General: She is not in acute distress.    Appearance: She is well-developed. She is not ill-appearing or toxic-appearing.  HENT:     Mouth/Throat:     Lips: Pink.     Mouth: Mucous membranes are moist.   Cardiovascular:     Rate and Rhythm: Normal rate and regular rhythm.     Pulses: Normal pulses.     Heart sounds: Normal heart sounds, S1 normal and S2 normal.  Pulmonary:     Effort: Pulmonary effort is normal.     Breath sounds: Normal breath sounds.  Abdominal:     General: Abdomen is flat. Bowel sounds are normal.     Palpations: Abdomen is soft.     Tenderness: There is abdominal tenderness in the right upper quadrant. There is no right CVA tenderness,  left CVA tenderness, guarding or rebound.  Skin:    General: Skin is warm and dry.  Neurological:     Mental Status: She is alert.     GCS: GCS eye subscore is 4. GCS verbal subscore is 5. GCS motor subscore is 6.  Psychiatric:  Speech: Speech normal.        Behavior: Behavior normal. Behavior is cooperative.     Assessment and Plan:   Assessment and Plan Assessment & Plan Cavernous hemangioma of liver Stable benign liver mass, 10 cm, since 2022. Abdominal bloating and fatigue attributed to mass. Interested in surgical removal or other intervention for quality of life improvement.  - Refer to general surgery vs IR for evaluation of liver mass treatment -- I have messaged and IR contact and asked them how to proceed and will await from them before placing referral. - Allow surgical team to determine need for updated imaging prior to surgery.  Essential hypertension Home blood pressure readings range from 136 to 140 mmHg. Higher readings in clinical settings likely due to white coat syndrome. Medication compliance noted. - Continue metoprolol  12.5 mg twice daily   Facial swelling Recurrent cheek swelling and erythema, etiology unclear. Possible allergic reaction or dental issue. No current symptom(s). - Encourage follow-up with dentist to address potential dental issues contributing to cheek swelling.       Lucie Buttner, PA-C

## 2024-07-31 LAB — CBC WITH DIFFERENTIAL/PLATELET
Absolute Lymphocytes: 2564 {cells}/uL (ref 850–3900)
Absolute Monocytes: 493 {cells}/uL (ref 200–950)
Basophils Absolute: 54 {cells}/uL (ref 0–200)
Basophils Relative: 0.7 %
Eosinophils Absolute: 169 {cells}/uL (ref 15–500)
Eosinophils Relative: 2.2 %
HCT: 43 % (ref 35.0–45.0)
Hemoglobin: 14 g/dL (ref 11.7–15.5)
MCH: 29.2 pg (ref 27.0–33.0)
MCHC: 32.6 g/dL (ref 32.0–36.0)
MCV: 89.6 fL (ref 80.0–100.0)
MPV: 9.8 fL (ref 7.5–12.5)
Monocytes Relative: 6.4 %
Neutro Abs: 4420 {cells}/uL (ref 1500–7800)
Neutrophils Relative %: 57.4 %
Platelets: 361 Thousand/uL (ref 140–400)
RBC: 4.8 Million/uL (ref 3.80–5.10)
RDW: 12.5 % (ref 11.0–15.0)
Total Lymphocyte: 33.3 %
WBC: 7.7 Thousand/uL (ref 3.8–10.8)

## 2024-07-31 LAB — HEMOGLOBIN A1C
Hgb A1c MFr Bld: 5.6 % (ref <5.7)
Mean Plasma Glucose: 114 mg/dL
eAG (mmol/L): 6.3 mmol/L

## 2024-07-31 LAB — COMPREHENSIVE METABOLIC PANEL WITH GFR
AG Ratio: 1.6 (calc) (ref 1.0–2.5)
ALT: 37 U/L — ABNORMAL HIGH (ref 6–29)
AST: 30 U/L (ref 10–35)
Albumin: 4.7 g/dL (ref 3.6–5.1)
Alkaline phosphatase (APISO): 103 U/L (ref 37–153)
BUN: 8 mg/dL (ref 7–25)
CO2: 29 mmol/L (ref 20–32)
Calcium: 9.9 mg/dL (ref 8.6–10.4)
Chloride: 103 mmol/L (ref 98–110)
Creat: 0.8 mg/dL (ref 0.50–1.03)
Globulin: 2.9 g/dL (ref 1.9–3.7)
Glucose, Bld: 101 mg/dL — ABNORMAL HIGH (ref 65–99)
Potassium: 4.2 mmol/L (ref 3.5–5.3)
Sodium: 141 mmol/L (ref 135–146)
Total Bilirubin: 0.4 mg/dL (ref 0.2–1.2)
Total Protein: 7.6 g/dL (ref 6.1–8.1)
eGFR: 88 mL/min/1.73m2 (ref >=60)

## 2024-07-31 LAB — LIPID PANEL
Cholesterol: 218 mg/dL — ABNORMAL HIGH (ref <200)
HDL: 67 mg/dL (ref >=50)
LDL Cholesterol (Calc): 127 mg/dL — ABNORMAL HIGH
Non-HDL Cholesterol (Calc): 151 mg/dL — ABNORMAL HIGH (ref <130)
Total CHOL/HDL Ratio: 3.3 (calc) (ref <5.0)
Triglycerides: 125 mg/dL (ref <150)

## 2024-08-02 ENCOUNTER — Other Ambulatory Visit: Payer: Self-pay | Admitting: Physician Assistant

## 2024-08-02 ENCOUNTER — Ambulatory Visit: Payer: Self-pay | Admitting: Physician Assistant

## 2024-08-02 DIAGNOSIS — D1803 Hemangioma of intra-abdominal structures: Secondary | ICD-10-CM

## 2024-08-02 MED ORDER — LOVASTATIN 40 MG PO TABS: 40.0000 mg | ORAL_TABLET | Freq: Every day | ORAL | 3 refills | Status: DC

## 2024-08-31 ENCOUNTER — Emergency Department (HOSPITAL_COMMUNITY)
Admission: EM | Admit: 2024-08-31 | Discharge: 2024-09-01 | Disposition: A | Discharge status: Home / Self Care | Attending: Emergency Medicine | Admitting: Emergency Medicine

## 2024-08-31 ENCOUNTER — Other Ambulatory Visit: Payer: Self-pay

## 2024-08-31 DIAGNOSIS — Z7982 Long term (current) use of aspirin: Secondary | ICD-10-CM | POA: Diagnosis not present

## 2024-08-31 DIAGNOSIS — Z8616 Personal history of COVID-19: Secondary | ICD-10-CM | POA: Insufficient documentation

## 2024-08-31 DIAGNOSIS — G44209 Tension-type headache, unspecified, not intractable: Secondary | ICD-10-CM | POA: Insufficient documentation

## 2024-08-31 DIAGNOSIS — Y92410 Unspecified street and highway as the place of occurrence of the external cause: Secondary | ICD-10-CM | POA: Diagnosis not present

## 2024-08-31 DIAGNOSIS — I10 Essential (primary) hypertension: Secondary | ICD-10-CM | POA: Diagnosis not present

## 2024-08-31 DIAGNOSIS — Z79899 Other long term (current) drug therapy: Secondary | ICD-10-CM | POA: Insufficient documentation

## 2024-08-31 DIAGNOSIS — S199XXA Unspecified injury of neck, initial encounter: Secondary | ICD-10-CM | POA: Diagnosis present

## 2024-08-31 DIAGNOSIS — Z8673 Personal history of transient ischemic attack (TIA), and cerebral infarction without residual deficits: Secondary | ICD-10-CM | POA: Insufficient documentation

## 2024-08-31 DIAGNOSIS — S161XXA Strain of muscle, fascia and tendon at neck level, initial encounter: Secondary | ICD-10-CM | POA: Diagnosis not present
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
## 2024-09-01 ENCOUNTER — Emergency Department (HOSPITAL_COMMUNITY)

## 2024-09-01 MED ORDER — ACETAMINOPHEN 500 MG PO TABS
1000.0000 mg | ORAL_TABLET | Freq: Once | ORAL | Status: AC
  Administered 2024-09-01: 1000 mg via ORAL
  Filled 2024-09-01: qty 2
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
## 2024-09-01 NOTE — ED Notes (Signed)
 Patient transported to CT

## 2024-10-01 ENCOUNTER — Ambulatory Visit: Admitting: Student in an Organized Health Care Education/Training Program

## 2024-10-26 ENCOUNTER — Ambulatory Visit: Payer: Self-pay

## 2024-10-26 NOTE — Telephone Encounter (Signed)
 Noted

## 2024-10-26 NOTE — Telephone Encounter (Signed)
 FYI Only or Action Required?: FYI only for provider: appointment scheduled on 10/28/24.  Patient was last seen in primary care on 07/30/2024 by Job Lukes, PA.  Called Nurse Triage reporting Abdominal Pain, Dizziness, and Nausea.  Symptoms began several days ago.  Interventions attempted: OTC medications: Tylenol .  Symptoms are: improved with Tylenol , intermittent and resolved at this time.  Triage Disposition: See PCP When Office is Open (Within 3 Days) (overriding See Physician Within 24 Hours)  Patient/caregiver understands and will follow disposition?: Yes         Copied from CRM #[id number]. Topic: Clinical - Red Word Triage >> Oct 26, 2024 11:05 AM Harlene ORN wrote: Red Word that prompted transfer to Nurse Triage: pain in her right of the stomach, dizziness, nausea and vomiting getting worse. Been going on for several days. Reason for Disposition  [1] MODERATE pain (e.g., interferes with normal activities) AND [2] pain comes and goes (cramps) AND [3] present > 24 hours  (Exception: Pain with Vomiting or Diarrhea - see that Guideline.)  Answer Assessment - Initial Assessment Questions 1. LOCATION: Where does it hurt?      Right upper near epigastric and braline.  2. RADIATION: Does the pain shoot anywhere else? (e.g., chest, back)     No.  3. ONSET: When did the pain begin? (e.g., minutes, hours or days ago)      Several days, can not remember when or how long, just states days  4. SUDDEN: Gradual or sudden onset?     Suddenly.  5. PATTERN Does the pain come and go, or is it constant?     Comes and goes. Lasts 1-1.5 hours.  6. SEVERITY: How bad is the pain?  (e.g., Scale 1-10; mild, moderate, or severe)     10/10. Taking Tylenol , helps with pain.  7. RECURRENT SYMPTOM: Have you ever had this type of stomach pain before? If Yes, ask: When was the last time? and What happened that time?      Yes, in the past she has felt this pain but not as  strong. 3 months ago. She states she went to the ED twice for this pain.  8. CAUSE: What do you think is causing the stomach pain? (e.g., gallstones, recent abdominal surgery)     She states she was told she has stitches on (her) liver  9. RELIEVING/AGGRAVATING FACTORS: What makes it better or worse? (e.g., antacids, bending or twisting motion, bowel movement)     Worse when eating.  10. OTHER SYMPTOMS: Do you have any other symptoms? (e.g., back pain, diarrhea, fever, urination pain, vomiting)       Nausea, intermittent dizziness feels like a lot of tiredness x several days. Patient clarified no vomiting. Denies blood or black tarry BM, chest pain, SOB, fever , unilateral numbness or weakness, changes in speech. Patient states she will get intermittent blurry vision and the sclera of the right eye looks reddish just a little bit and not like blood. No blurry vision now.  Protocols used: Abdominal Pain - Female-A-AH

## 2024-10-28 ENCOUNTER — Ambulatory Visit: Admitting: Family Medicine

## 2024-10-28 VITALS — BP 142/80 | HR 115 | Temp 98.3°F | Ht 63.0 in | Wt 151.6 lb

## 2024-10-28 DIAGNOSIS — D1803 Hemangioma of intra-abdominal structures: Secondary | ICD-10-CM

## 2024-10-28 DIAGNOSIS — I10 Essential (primary) hypertension: Secondary | ICD-10-CM | POA: Diagnosis not present

## 2024-10-28 DIAGNOSIS — R400 Somnolence: Secondary | ICD-10-CM | POA: Diagnosis not present

## 2024-10-28 LAB — TSH: TSH: 1.29 u[IU]/mL (ref 0.35–5.50)

## 2024-10-28 LAB — CBC
HCT: 42.2 % (ref 36.0–46.0)
Hemoglobin: 14.1 g/dL (ref 12.0–15.0)
MCHC: 33.5 g/dL (ref 30.0–36.0)
MCV: 88.8 fl (ref 78.0–100.0)
Platelets: 405 K/uL — ABNORMAL HIGH (ref 150.0–400.0)
RBC: 4.75 Mil/uL (ref 3.87–5.11)
RDW: 13.4 % (ref 11.5–15.5)
WBC: 7.4 K/uL (ref 4.0–10.5)

## 2024-10-28 LAB — COMPREHENSIVE METABOLIC PANEL WITH GFR
ALT: 30 U/L (ref 0–35)
AST: 30 U/L (ref 0–37)
Albumin: 4.5 g/dL (ref 3.5–5.2)
Alkaline Phosphatase: 106 U/L (ref 39–117)
BUN: 10 mg/dL (ref 6–23)
CO2: 29 meq/L (ref 19–32)
Calcium: 10.3 mg/dL (ref 8.4–10.5)
Chloride: 102 meq/L (ref 96–112)
Creatinine, Ser: 0.76 mg/dL (ref 0.40–1.20)
GFR: 89.1 mL/min (ref >60.00)
Glucose, Bld: 101 mg/dL — ABNORMAL HIGH (ref 70–99)
Potassium: 4 meq/L (ref 3.5–5.1)
Sodium: 141 meq/L (ref 135–145)
Total Bilirubin: 0.4 mg/dL (ref 0.2–1.2)
Total Protein: 7.5 g/dL (ref 6.0–8.3)

## 2024-10-28 LAB — VITAMIN D 25 HYDROXY (VIT D DEFICIENCY, FRACTURES): VITD: 18.98 ng/mL — ABNORMAL LOW (ref 30.00–100.00)

## 2024-10-28 LAB — FOLATE: Folate: 16.5 ng/mL (ref >5.9)

## 2024-10-28 LAB — VITAMIN B12: Vitamin B-12: 240 pg/mL (ref 211–911)

## 2024-10-28 NOTE — Patient Instructions (Addendum)
 It was very nice to see you today!  VISIT SUMMARY: Today, we discussed your ongoing fatigue, nausea, and abdominal pain, which are likely due to a growing benign liver hemangioma. We also talked about the possibility of sleep apnea contributing to your fatigue.  YOUR PLAN: HEMANGIOMA OF LIVER: You have a benign growth in your liver that is causing your abdominal symptoms. -We ordered blood work to check your blood counts, thyroid  function, vitamin levels, potassium, and magnesium. -You have been referred to an interventional radiologist or general surgeon for further evaluation and potential treatment. -We discussed options for surgical removal or radiological intervention.  SUSPECTED SLEEP APNEA: Your daytime fatigue and family history suggest you might have sleep apnea. -We ordered a sleep study to confirm if you have sleep apnea.  Return if symptoms worsen or fail to improve.   Take care, Dr Kennyth  PLEASE NOTE:  If you had any lab tests, please let us  know if you have not heard back within a few days. You may see your results on mychart before we have a chance to review them but we will give you a call once they are reviewed by us .   If we ordered any referrals today, please let us  know if you have not heard from their office within the next week.   If you had any urgent prescriptions sent in today, please check with the pharmacy within an hour of our visit to make sure the prescription was transmitted appropriately.   Please try these tips to maintain a healthy lifestyle:  Eat at least 3 REAL meals and 1-2 snacks per day.  Aim for no more than 5 hours between eating.  If you eat breakfast, please do so within one hour of getting up.   Each meal should contain half fruits/vegetables, one quarter protein, and one quarter carbs (no bigger than a computer mouse)  Cut down on sweet beverages. This includes juice, soda, and sweet tea.   Drink at least 1 glass of water with each meal  and aim for at least 8 glasses per day  Exercise at least 150 minutes every week.

## 2024-10-28 NOTE — Progress Notes (Signed)
   Carla [name] is a 53 y.o. female who presents today for an office visit.  Assessment/Plan:  Giant cavernous hemangioma of liver Had lengthy discussion with patient today regarding her persistent right upper quadrant pain and nausea.  She has known giant cavernous hemangioma and saw gastroenterology earlier this year.  It looks like they were planning on potentially referring her to interventional radiology however she has not heard anything back from them for the last couple of months.  She is still having persistent symptoms and would like to explore interventional options at this point.  Will place referral to general surgery further evaluation.  Fatigue/daytime somnolence Multifactorial.  We are checking labs today including CBC, c-Met, TSH, B12, vitamin D, and folate though high degree of suspicion for sleep apnea given her snoring and excessive daytime somnolence.  She is agreeable to sleep study and we will place a referral for this today as well.  Essential hypertension Very mildly elevated today.  She will continue her previous prescription metoprolol  tartrate 12.5 mg twice daily and monitor at home.  She will let me or PCP know if persistently elevated.    Subjective:  HPI:  See assessment / plan for status of chronic conditions.   Discussed the use of AI scribe software for clinical note transcription with the patient, who gave verbal consent to proceed.  History of Present Illness Carla [name] is a 53 year old female who presents with fatigue, nausea, and abdominal pain.  She experiences a sensation of heaviness and swelling in her abdomen, describing it as 'puffy' with a feeling of something rising. These symptoms have persisted for several months, worsening over the last several weeks.  She experiences significant fatigue and sometimes blurry vision. She feels dizzy but has not vomited, although she often feels the urge to do so. She has not tried any home treatments for  these symptoms.  She has been told that she has a hemangioma in her liver and has been informed that it is growing. She has not been referred to a specialist for this issue and has not been prescribed any medication. She has visited the emergency room three times due to this condition.  She consumes two cups of coffee in the morning and drinks Coca Cola during the day to maintain energy levels. Despite this, she still feels fatigued. She sleeps well at night but sometimes takes naps during the day.  She has been informed by a neurologist about the possibility of sleep apnea, but has not undergone any testing. Her husband reports that her snoring is not severe. She has a family history of snoring, as her son also snores heavily.         Objective:  Physical Exam: BP (!) 140/88   Pulse (!) 115   Temp 98.3 F (36.8 C) (Temporal)   Ht 5' 3 (1.6 m)   Wt 151 lb 9.6 oz (68.8 kg)   LMP 01/16/2020   SpO2 98%   BMI 26.85 kg/m   Gen: No acute distress, resting comfortably CV: Regular rate and rhythm with no murmurs appreciated Pulm: Normal work of breathing, clear to auscultation bilaterally with no crackles, wheezes, or rhonchi Neuro: Grossly normal, moves all extremities Psych: Normal affect and thought content      Loyalty Arentz M. Kennyth, MD 10/28/2024 2:46 PM

## 2024-10-29 LAB — HOMOCYSTEINE: Homocysteine: 10 umol/L (ref <=13.4)

## 2024-11-01 ENCOUNTER — Ambulatory Visit: Attending: Cardiology | Admitting: Student in an Organized Health Care Education/Training Program

## 2024-11-01 ENCOUNTER — Ambulatory Visit: Attending: Student in an Organized Health Care Education/Training Program

## 2024-11-01 ENCOUNTER — Other Ambulatory Visit: Payer: Self-pay

## 2024-11-01 ENCOUNTER — Encounter: Payer: Self-pay | Admitting: Student in an Organized Health Care Education/Training Program

## 2024-11-01 ENCOUNTER — Other Ambulatory Visit (HOSPITAL_COMMUNITY): Payer: Self-pay

## 2024-11-01 ENCOUNTER — Ambulatory Visit: Payer: Self-pay | Admitting: Family Medicine

## 2024-11-01 VITALS — BP 170/98 | HR 105 | Ht 65.0 in | Wt 155.0 lb

## 2024-11-01 DIAGNOSIS — I10 Essential (primary) hypertension: Secondary | ICD-10-CM

## 2024-11-01 DIAGNOSIS — R000 Tachycardia, unspecified: Secondary | ICD-10-CM

## 2024-11-01 DIAGNOSIS — E782 Mixed hyperlipidemia: Secondary | ICD-10-CM

## 2024-11-01 DIAGNOSIS — I639 Cerebral infarction, unspecified: Secondary | ICD-10-CM

## 2024-11-01 DIAGNOSIS — I493 Ventricular premature depolarization: Secondary | ICD-10-CM

## 2024-11-01 MED ORDER — METOPROLOL SUCCINATE ER 25 MG PO TB24
25.0000 mg | ORAL_TABLET | Freq: Every day | ORAL | 3 refills | Status: AC
  Filled 2024-11-01 (×2): qty 90, 90d supply, fill #0

## 2024-11-01 MED ORDER — VITAMIN D (ERGOCALCIFEROL) 1.25 MG (50000 UNIT) PO CAPS: 50000.0000 [IU] | ORAL_CAPSULE | ORAL | 0 refills | Status: AC

## 2024-11-01 MED ORDER — ROSUVASTATIN CALCIUM 10 MG PO TABS
10.0000 mg | ORAL_TABLET | Freq: Every day | ORAL | 3 refills | Status: AC
  Filled 2024-11-01: qty 90, 90d supply, fill #0

## 2024-11-01 MED ORDER — OMRON 3 SERIES BP MONITOR DEVI
0 refills | Status: AC
  Filled 2024-11-01 – 2024-12-06 (×2): qty 1, 30d supply, fill #0

## 2024-11-01 MED ORDER — LOSARTAN POTASSIUM 50 MG PO TABS
50.0000 mg | ORAL_TABLET | Freq: Every day | ORAL | 3 refills | Status: AC
  Filled 2024-11-01: qty 90, 90d supply, fill #0
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
## 2024-11-01 NOTE — Assessment & Plan Note (Signed)
-   Patient has resting sinus tachycardia without clear provocation.  Her last ECG in 2023 also showed resting sinus tachycardia. -She wore a 24-hour Holter monitor back in 2017 which showed an average heart rate of 95 bpm. -I am uncertain exactly why she has resting sinus tachycardia.  TSH and CBC were largely unremarkable except for thrombocytosis. -She reports drinking plenty of water and is not clearly dehydrated. -Says she does have reports of flushing, hypertension and and this resting sinus tachycardia, I will check some serum metanephrines to screen for pheochromocytoma.  Inappropriate sinus tachycardia is also on the differential as well. -I will also order a 3-day heart monitor to assess the degree of tachycardia. - Lastly, I will check an echocardiogram to rule out structural heart disease. 3-day heart monitor to assess tachycardia burden Complete echocardiogram to rule out structural heart disease Check serum metanephrines to screen for pheochromocytoma Follow-up in 1 month

## 2024-11-01 NOTE — Progress Notes (Signed)
 Vitamin D  is low.  This may be contributing some to her symptoms however do not think that this explains everything.  Recommend supplementation 50,000 IUs weekly.  Please send in new prescription.  We should recheck in 3 to 6 months.  All of her other labs are stable.

## 2024-11-01 NOTE — Assessment & Plan Note (Signed)
-   LDL is not at goal of <70 in light of her prior CVAs. -I will switch her lovastatin  to Crestor  for more potent lipid control. -I will start her at a lower dose since she does have chronic muscle/joint pains, but I suspect she will need a higher dose. Stop lovastatin  Start Crestor  10 mg daily Repeat lipids in 6-8 weeks

## 2024-11-01 NOTE — Assessment & Plan Note (Signed)
-   Blood pressure is very poorly controlled. - I will plan to start losartan  today, but I suspect she will need multiple agents. - I will also prescribe a blood pressure cuff I encouraged the patient to check her blood pressure twice daily for 2 weeks and let me know the results. Start losartan  50 mg daily Prescribed blood pressure cuff 2-week blood pressure log Serum metanephrines as described above

## 2024-11-01 NOTE — Assessment & Plan Note (Signed)
-   Prior CVAs of unclear etiology. - Will need to get her cholesterol down as described above. Continue baby aspirin  81 mg daily Crestor  as above

## 2024-11-01 NOTE — Assessment & Plan Note (Signed)
-   Noted to have a PVC burden of 15.6% back in 2017. - No PVCs on ECG today or on physical exam. - Although she was not prescribed metoprolol  for the treatment of PVCs, I think she may be deriving benefit from a PVC standpoint. - I will consolidate her metoprolol  to succinate and continue for now. Switch metoprolol  to metoprolol  succinate 25 mg daily Zio patch as above

## 2024-11-01 NOTE — Patient Instructions (Signed)
 Medication Instructions:  STOP Lovastatin   STOP Metoprolol  Tartrate  START Crestor  10 mg daily  START Losartan  50 mg daily  START Metoprolol  Succinate 25 mg daily   *If you need a refill on your cardiac medications before your next appointment, please call your pharmacy*  Lab Work: Plasma metanephrines   If you have labs (blood work) drawn today and your tests are completely normal, you will receive your results only by: MyChart Message (if you have MyChart) OR A paper copy in the mail If you have any lab test that is abnormal or we need to change your treatment, we will call you to review the results.  Testing/Procedures: Echocardiogram   Your physician has requested that you have an echocardiogram. Echocardiography is a painless test that uses sound waves to create images of your heart. It provides your doctor with information about the size and shape of your heart and how well your heart's chambers and valves are working. This procedure takes approximately one hour. There are no restrictions for this procedure. Please do NOT wear cologne, perfume, aftershave, or lotions (deodorant is allowed). Please arrive 15 minutes prior to your appointment time.  Please note: We ask at that you not bring children with you during ultrasound (echo/ vascular) testing. Due to room size and safety concerns, children are not allowed in the ultrasound rooms during exams. Our front office staff cannot provide observation of children in our lobby area while testing is being conducted. An adult accompanying a patient to their appointment will only be allowed in the ultrasound room at the discretion of the ultrasound technician under special circumstances. We apologize for any inconvenience.  3 day heart monitor   Your physician has requested that you wear a Zio heart monitor for __3___ days. This will be mailed to your home with instructions on how to apply the monitor and how to return it when finished.  Please allow 2 weeks after returning the heart monitor before our office calls you with the results.  CHECK BLOOD PRESSURE TWICE DAILY FOR 2 WEEKS AND ADVISE OFFICE OF RESULTS ONCE COMPLETED:   Blood Pressure Record Sheet To take your blood pressure, you will need a blood pressure machine. You can buy a blood pressure machine (blood pressure monitor) at your clinic, drug store, or online. When choosing one, consider: An automatic monitor that has an arm cuff. A cuff that wraps snugly around your upper arm. You should be able to fit only one finger between your arm and the cuff. A device that stores blood pressure reading results. Do not choose a monitor that measures your blood pressure from your wrist or finger. Follow your health care provider's instructions for how to take your blood pressure. To use this form: Take your blood pressure medications every day These measurements should be taken when you have been at rest for at least 10-15 min Take at least 2 readings with each blood pressure check. This makes sure the results are correct. Wait 1-2 minutes between measurements. Write down the results in the spaces on this form. Keep in mind it should always be recorded systolic over diastolic. Both numbers are important.  Repeat this every day for 2-3 weeks, or as told by your health care provider.  Make a follow-up appointment with your health care provider to discuss the results.  Blood Pressure Log Date Medications taken? (Y/N) Blood Pressure Time of Day  Follow-Up: At [facility], you and your health needs are our priority.  As part of our continuing mission to provide you with exceptional heart care, our providers are all part of one team.  This team includes your primary Cardiologist (physician) and Advanced Practice Providers or APPs (Physician  Assistants and Nurse Practitioners) who all work together to provide you with the care you need, when you need it.  Your next appointment:   1 month(s)  Provider:   Georganna Archer, MD

## 2024-11-01 NOTE — Progress Notes (Unsigned)
 Enrolled for [employer] to mail a ZIO XT long term holter monitor to the patients address on file.

## 2024-11-02 ENCOUNTER — Other Ambulatory Visit (HOSPITAL_COMMUNITY): Payer: Self-pay

## 2024-11-04 ENCOUNTER — Other Ambulatory Visit (HOSPITAL_COMMUNITY): Payer: Self-pay | Admitting: Surgery

## 2024-11-04 DIAGNOSIS — R1011 Right upper quadrant pain: Secondary | ICD-10-CM

## 2024-11-05 LAB — METANEPHRINES, PLASMA
Metanephrine, Free: <25 pg/mL (ref 0.0–88.0)
Normetanephrine, Free: 66.1 pg/mL (ref 0.0–244.0)

## 2024-11-06 ENCOUNTER — Ambulatory Visit: Payer: Self-pay | Admitting: Student in an Organized Health Care Education/Training Program

## 2024-11-17 ENCOUNTER — Ambulatory Visit (HOSPITAL_COMMUNITY)
Admission: RE | Admit: 2024-11-17 | Discharge: 2024-11-17 | Disposition: A | Discharge status: Home / Self Care | Source: Ambulatory Visit | Attending: Surgery | Admitting: Surgery

## 2024-11-17 DIAGNOSIS — R1011 Right upper quadrant pain: Secondary | ICD-10-CM | POA: Insufficient documentation

## 2024-11-17 MED ORDER — TECHNETIUM TC 99M MEBROFENIN IV KIT
5.0000 | PACK | Freq: Once | INTRAVENOUS | Status: AC | PRN
  Administered 2024-11-17: 5.15 via INTRAVENOUS

## 2024-11-25 DIAGNOSIS — R000 Tachycardia, unspecified: Secondary | ICD-10-CM

## 2024-12-06 ENCOUNTER — Ambulatory Visit (HOSPITAL_COMMUNITY): Attending: Physician Assistant

## 2024-12-06 ENCOUNTER — Other Ambulatory Visit (HOSPITAL_COMMUNITY): Payer: Self-pay

## 2024-12-08 ENCOUNTER — Ambulatory Visit: Attending: Cardiology | Admitting: Student in an Organized Health Care Education/Training Program

## 2024-12-08 ENCOUNTER — Encounter: Payer: Self-pay | Admitting: Student in an Organized Health Care Education/Training Program

## 2024-12-08 VITALS — BP 110/70 | HR 95 | Ht 63.0 in | Wt 156.6 lb

## 2024-12-08 DIAGNOSIS — E782 Mixed hyperlipidemia: Secondary | ICD-10-CM | POA: Diagnosis not present

## 2024-12-08 NOTE — Patient Instructions (Signed)
" °  Lab Work: Lipid panel Bmp Lp(a)  If you have labs (blood work) drawn today and your tests are completely normal, you will receive your results only by: MyChart Message (if you have MyChart) OR A paper copy in the mail If you have any lab test that is abnormal or we need to change your treatment, we will call you to review the results.    Follow-Up: At [facility], you and your health needs are our priority.  As part of our continuing mission to provide you with exceptional heart care, our providers are all part of one team.  This team includes your primary Cardiologist (physician) and Advanced Practice Providers or APPs (Physician Assistants and Nurse Practitioners) who all work together to provide you with the care you need, when you need it.  Your next appointment:   8 month(s)  Provider:   Georganna Archer, MD    We recommend signing up for the patient portal called MyChart.  Sign up information is provided on this After Visit Summary.  MyChart is used to connect with patients for Virtual Visits (Telemedicine).  Patients are able to view lab/test results, encounter notes, upcoming appointments, etc.  Non-urgent messages can be sent to your provider as well.   To learn more about what you can do with MyChart, go to [email or web address].            "

## 2024-12-08 NOTE — Progress Notes (Signed)
 " Cardiology Office Note:  .   Date:  12/08/2024  ID:  Carla [name], DOB 1971-06-27, MRN [id number] PCP: Job Lukes, PA  [facility] Providers Cardiologist:  Georganna Archer, MD { Chief Complaint:  Chief Complaint  Patient presents with   Hypertension    History of Present Illness: .    Carla [name] is a 54 y.o. female with a PMH of HTN, HLD, symptomatic PVCs, prior CVA who presents for follow up.      The patient establish care with me on 11/01/2024 for tachycardia, dizziness and hypertension.  Heart monitor results were unremarkable.     Discussed the use of AI scribe software for clinical note transcription with the patient, who gave verbal consent to proceed.  History of Present Illness - Patient presents today for follow-up and states that she is feeling much better since starting the new medications. - The dizziness and palpitations that she was experiencing previously have not resolved.  No side effects to her current regiment. - She denies chest pain, SOB, PND, orthopnea. - She is going to undergo dental procedure with several teeth removed and is wondering if any of her medications need to be adjusted. - No further complaints or concerns.   Studies Reviewed: SABRA    EKG: No new ECG       Cardiac Studies & Procedures   ______________________________________________________________________________________________     ECHOCARDIOGRAM  ECHOCARDIOGRAM COMPLETE 10/12/2015  Narrative *[place]* *[facility]* [address] [place], [place] [address] [phone]  ------------------------------------------------------------------- Transthoracic Echocardiography  Patient:    Carla, [name] MR #:       [id number] Study Date: 10/12/2015 Gender:     F Age:        43 Height:     165.1 cm Weight:     64 kg BSA:        1.72 m^2 Pt. Status: Room:       5M03C  PERFORMING   W. Jacques Somerset, MD [facility] ATTENDING    Dasie Curtistine ONEIDA TISA Dasie Curtistine ONEIDA REFERRING    Dasie Curtistine T SONOGRAPHER  Vernell Saba ADMITTING    Jerrell Cleatus Ned  cc:  ------------------------------------------------------------------- LV EF: 55% -   60%  ------------------------------------------------------------------- Indications:      CVA 436.  ------------------------------------------------------------------- History:   PMH:  Liver masses. Migraines. Palpitations.  ------------------------------------------------------------------- Study Conclusions  - Left ventricle: The cavity size was normal. Wall thickness was increased in a pattern of mild LVH. Systolic function was normal. The estimated ejection fraction was in the range of 55% to 60%. Although no diagnostic regional wall motion abnormality was identified, this possibility cannot be completely excluded on the basis of this study. Doppler parameters are consistent with abnormal left ventricular relaxation (grade 1 diastolic dysfunction). - Aortic valve: There was mild regurgitation.  Transthoracic echocardiography.  M-mode, complete 2D, spectral Doppler, and color Doppler.  Birthdate:  Patient birthdate: 1971-06-15.  Age:  Patient is 54 yr old.  Sex:  Gender: female. BMI: 23.5 kg/m^2.  Blood pressure:     114/74  Patient status: Inpatient.  Study date:  Study date: 10/12/2015. Study time: 09:48 AM.  Location:  Bedside.  -------------------------------------------------------------------  ------------------------------------------------------------------- Left ventricle:  The cavity size was normal. Wall thickness was increased in a pattern of mild LVH. Systolic function was normal. The estimated ejection fraction was in the range of 55% to 60%. Although no diagnostic regional wall motion abnormality was identified, this possibility cannot be  completely excluded on the basis of this study. Doppler parameters are consistent with abnormal left  ventricular relaxation (grade 1 diastolic dysfunction).  ------------------------------------------------------------------- Aortic valve:   Trileaflet; normal thickness leaflets. Mobility was not restricted.  Doppler:  Transvalvular velocity was within the normal range. There was no stenosis. There was mild regurgitation.  ------------------------------------------------------------------- Aorta:  Aortic root: The aortic root was normal in size.  ------------------------------------------------------------------- Mitral valve:   Structurally normal valve.   Mobility was not restricted.  Doppler:  Transvalvular velocity was within the normal range. There was no evidence for stenosis. There was no regurgitation.    Peak gradient (D): 2 mm Hg.  ------------------------------------------------------------------- Left atrium:  The atrium was normal in size.  ------------------------------------------------------------------- Right ventricle:  The cavity size was normal. Wall thickness was normal. Systolic function was normal.  ------------------------------------------------------------------- Pulmonic valve:   Not well visualized.  Doppler:  Transvalvular velocity was within the normal range. There was no evidence for stenosis.  ------------------------------------------------------------------- Tricuspid valve:   Structurally normal valve.    Doppler: Transvalvular velocity was within the normal range. There was no regurgitation.  ------------------------------------------------------------------- Pulmonary artery:   The main pulmonary artery was normal-sized. Systolic pressure was within the normal range.  ------------------------------------------------------------------- Right atrium:  The atrium was normal in size.  ------------------------------------------------------------------- Pericardium:  There was no pericardial  effusion.  ------------------------------------------------------------------- Systemic veins: Inferior vena cava: The vessel was normal in size.  ------------------------------------------------------------------- Measurements  Left ventricle                           Value        Reference LV ID, ED, PLAX chordal          (L)     41.9  mm     43 - 52 LV ID, ES, PLAX chordal                  29.6  mm     23 - 38 LV fx shortening, PLAX chordal           29    %      >=29 LV PW thickness, ED                      6.43  mm     --------- IVS/LV PW ratio, ED                      1.02         <=1.3 LV ejection fraction, 1-p A4C            62    %      --------- LV end-diastolic volume, 2-p             63    ml     --------- LV end-systolic volume, 2-p              23    ml     --------- LV ejection fraction, 2-p                63    %      --------- Stroke volume, 2-p                       40    ml     --------- LV end-diastolic volume/bsa, 2-p         37    ml/m^2 ---------  LV end-systolic volume/bsa, 2-p          14    ml/m^2 --------- Stroke volume/bsa, 2-p                   23.3  ml/m^2 --------- LV e&', lateral                           14.4  cm/s   --------- LV E/e&', lateral                         4.95         --------- LV e&', medial                            8.59  cm/s   --------- LV E/e&', medial                          8.3          --------- LV e&', average                           11.5  cm/s   --------- LV E/e&', average                         6.2          ---------  Ventricular septum                       Value        Reference IVS thickness, ED                        6.56  mm     ---------  LVOT                                     Value        Reference LVOT ID, S                               20    mm     --------- LVOT area                                3.14  cm^2   ---------  Aorta                                    Value        Reference Aortic root ID, ED                        27    mm     ---------  Left atrium                              Value        Reference LA ID, A-P, ES  28    mm     --------- LA ID/bsa, A-P                           1.63  cm/m^2 <=2.2 LA volume, S                             28.4  ml     --------- LA volume/bsa, S                         16.5  ml/m^2 --------- LA volume, ES, 1-p A4C                   17    ml     --------- LA volume/bsa, ES, 1-p A4C               9.9   ml/m^2 --------- LA volume, ES, 1-p A2C                   40    ml     --------- LA volume/bsa, ES, 1-p A2C               23.3  ml/m^2 ---------  Mitral valve                             Value        Reference Mitral E-wave peak velocity              71.3  cm/s   --------- Mitral A-wave peak velocity              114   cm/s   --------- Mitral deceleration time                 153   ms     150 - 230 Mitral peak gradient, D                  2     mm Hg  --------- Mitral E/A ratio, peak                   0.6          ---------  Pulmonary arteries                       Value        Reference PA pressure, S, DP                       25    mm Hg  <=30  Tricuspid valve                          Value        Reference Tricuspid regurg peak velocity           234   cm/s   --------- Tricuspid peak RV-RA gradient            22    mm Hg  ---------  Systemic veins                           Value        Reference Estimated CVP  3     mm Hg  ---------  Right ventricle                          Value        Reference TAPSE                                    23.6  mm     --------- RV pressure, S, DP                       25    mm Hg  <=30 RV s&', lateral, S                        13.7  cm/s   ---------  Legend: (L)  and  (H)  mark values outside specified reference range.  ------------------------------------------------------------------- Prepared and Electronically Authenticated by  MICAEL Jacques Somerset, MD  [facility] 2016-11-17T12:36:11   TEE  ECHO TEE 10/13/2015  Narrative *[place]* *[facility]* [address] [place], [place] [address] [phone]  ------------------------------------------------------------------- Transesophageal Echocardiography  Patient:    Amberley, Hamler MR #:       [id number] Study Date: 10/13/2015 Gender:     F Age:        43 Height:     162.6 cm Weight:     64.1 kg BSA:        1.71 m^2 Pt. Status: Room:  PERFORMING   Redell Shallow ATTENDING    Barrett, Shona KANDICE FINES     Barrett, Shona KANDICE REFERRING    Barrett, Shona KANDICE SONOGRAPHER  Ellouise Mose  cc:  ------------------------------------------------------------------- LV EF: 55% -   60%  ------------------------------------------------------------------- Indications:      CVA 436.  ------------------------------------------------------------------- History:   PMH:  No prior cardiac history.  ------------------------------------------------------------------- Study Conclusions  - Left ventricle: Systolic function was normal. The estimated ejection fraction was in the range of 55% to 60%. Wall motion was normal; there were no regional wall motion abnormalities. - Aortic valve: No evidence of vegetation. - Mitral valve: No evidence of vegetation. - Left atrium: No evidence of thrombus in the atrial cavity or appendage. - Right atrium: No evidence of thrombus in the atrial cavity or appendage. - Atrial septum: No defect or patent foramen ovale was identified. Echo contrast study showed no right-to-left atrial level shunt. - Tricuspid valve: No evidence of vegetation. - Pulmonic valve: No evidence of vegetation.  Impressions:  - Normal LV function; negative saline microcavitation study.  Diagnostic transesophageal echocardiography.  2D and color Doppler. Birthdate:  Patient birthdate: 1971/01/14.  Age:  Patient is 54 yr old.  Sex:  Gender: female.    BMI: 24.3 kg/m^2.   Blood pressure: 152/98  Patient status:  Inpatient.  Study date:  Study date: 10/13/2015. Study time: 01:59 PM.  Location:  Endoscopy.  -------------------------------------------------------------------  ------------------------------------------------------------------- Left ventricle:  Systolic function was normal. The estimated ejection fraction was in the range of 55% to 60%. Wall motion was normal; there were no regional wall motion abnormalities.  ------------------------------------------------------------------- Aortic valve:   Structurally normal valve.   Cusp separation was normal.  No evidence of vegetation.  Doppler:  There was no regurgitation.  ------------------------------------------------------------------- Aorta:  Descending aorta: The descending aorta had mild diffuse disease.  ------------------------------------------------------------------- Mitral valve:   Structurally normal valve.  Leaflet separation was normal.  No evidence of vegetation.  Doppler:  There was no regurgitation.  ------------------------------------------------------------------- Left atrium:  The atrium was normal in size.  No evidence of thrombus in the atrial cavity or appendage.  ------------------------------------------------------------------- Atrial septum:  No defect or patent foramen ovale was identified. Echo contrast study showed no right-to-left atrial level shunt.  ------------------------------------------------------------------- Right ventricle:  The cavity size was normal. Systolic function was normal.  ------------------------------------------------------------------- Pulmonic valve:    Structurally normal valve.   Cusp separation was normal.  No evidence of vegetation.  ------------------------------------------------------------------- Tricuspid valve:   Structurally normal valve.   Leaflet separation was normal.  No evidence of vegetation.  Doppler:  There  was no regurgitation.  ------------------------------------------------------------------- Right atrium:  The atrium was normal in size.  No evidence of thrombus in the atrial cavity or appendage.  ------------------------------------------------------------------- Pericardium:  There was no pericardial effusion.  ------------------------------------------------------------------- Prepared and Electronically Authenticated by  Redell Shallow 2016-11-18T14:34:58  MONITORS  LONG TERM MONITOR (3-14 DAYS) 11/16/2024  Narrative Details: Patch Wear Time:  3 days and 0 hours. Patient had a min HR of 54 bpm, max HR of 148 bpm, and avg HR of 93 bpm.  Predominant Rhythm: Normal Sinus  Arrhythmias: No arrhythmias identified  Ectopic Beats: There were rare premature atrial contractions (<1%) and rare premature ventricular contractions (<1%)  Patient Triggered Events: There were 1 patient triggered events. These events were associated with normal sinus rhythm.   Impressions: Unremarkable heart monitor results with predominant normal sinus rhythm. Ectopic beats were rare and within normal limits. No arrhythmias were seen.  Shenaya Lebo T. Floretta HEATH, MD [place]  [facility] 11/25/2024 11:24 AM       ______________________________________________________________________________________________      Results   Risk Assessment/Calculations:               Physical Exam:    VS:  BP 110/70   Pulse 95   Ht 5' 3 (1.6 m)   Wt 156 lb 9.6 oz (71 kg)   LMP 01/16/2020   SpO2 97%   BMI 27.74 kg/m      Wt Readings from Last 3 Encounters:  11/01/24 155 lb (70.3 kg)  10/28/24 151 lb 9.6 oz (68.8 kg)  08/31/24 147 lb 7.8 oz (66.9 kg)     GEN: Well nourished, well developed, in no acute distress NECK: No JVD; No carotid bruits CARDIAC: RRR, no murmurs, rubs, gallops RESPIRATORY:  Clear to auscultation without rales, wheezing or rhonchi  ABDOMEN: Soft, non-tender,  non-distended, normal bowel sounds EXTREMITIES:  Warm and well perfused, no edema; No deformity, 2+ radial pulses PSYCH: Normal mood and affect   ASSESSMENT AND PLAN: .     Assessment & Plan  #HTN - Blood pressures are now much better controlled on her current antihypertensive regiment.  No changes. Continue losartan  50 mg daily Continue metoprolol  succinate 25 mg daily Check BMP Follow-up in 8 months  #HLD - Last LDL was above goal which is less than 70. - I started her on rosuvastatin  for improved lipid control. - Will recheck a lipid panel today. Lipid panel Continue Crestor  10 mg daily  #Sinus Tachycardia - This is proved since being on the metoprolol . -She is still pending her complete echocardiogram to rule out any cardiac structural abnormalities that could be driving this. Follow-up echocardiogram       This note was written with the assistance of a dictation microphone or AI dictation software. Please excuse any typos or grammatical  errors.   Signed, Georganna Archer, MD  12/08/2024 3:41 PM     [facility] "

## 2024-12-11 LAB — BASIC METABOLIC PANEL WITH GFR
BUN/Creatinine Ratio: 11 (ref 9–23)
BUN: 10 mg/dL (ref 6–24)
CO2: 26 mmol/L (ref 20–29)
Calcium: 9.8 mg/dL (ref 8.7–10.2)
Chloride: 102 mmol/L (ref 96–106)
Creatinine, Ser: 0.95 mg/dL (ref 0.57–1.00)
Glucose: 82 mg/dL (ref 70–99)
Potassium: 4.1 mmol/L (ref 3.5–5.2)
Sodium: 144 mmol/L (ref 134–144)
eGFR: 71 mL/min/1.73

## 2024-12-11 LAB — LIPID PANEL
Chol/HDL Ratio: 2.8 ratio (ref 0.0–4.4)
Cholesterol, Total: 170 mg/dL (ref 100–199)
HDL: 60 mg/dL
LDL Chol Calc (NIH): 83 mg/dL (ref 0–99)
Triglycerides: 161 mg/dL — ABNORMAL HIGH (ref 0–149)
VLDL Cholesterol Cal: 27 mg/dL (ref 5–40)

## 2024-12-11 LAB — LIPOPROTEIN A (LPA): Lipoprotein (a): 12.4 nmol/L

## 2024-12-12 ENCOUNTER — Ambulatory Visit: Payer: Self-pay | Admitting: Student in an Organized Health Care Education/Training Program

## 2024-12-30 ENCOUNTER — Ambulatory Visit (HOSPITAL_COMMUNITY)
Admission: RE | Admit: 2024-12-30 | Discharge: 2024-12-30 | Disposition: A | Source: Ambulatory Visit | Attending: Student in an Organized Health Care Education/Training Program

## 2024-12-30 DIAGNOSIS — R0609 Other forms of dyspnea: Secondary | ICD-10-CM

## 2024-12-30 DIAGNOSIS — I10 Essential (primary) hypertension: Secondary | ICD-10-CM

## 2024-12-30 DIAGNOSIS — R000 Tachycardia, unspecified: Secondary | ICD-10-CM

## 2024-12-30 LAB — ECHOCARDIOGRAM COMPLETE
AR max vel: 1.87 cm2
AV Area VTI: 2.04 cm2
AV Area mean vel: 1.53 cm2
AV Mean grad: 4 mmHg
AV Peak grad: 8 mmHg
Ao pk vel: 1.41 m/s
Area-P 1/2: 3.6 cm2
S' Lateral: 2.61 cm

## 2025-02-22 ENCOUNTER — Ambulatory Visit: Admitting: Adult Health
